# Patient Record
Sex: Male | Born: 1935 | Race: White | Hispanic: No | Marital: Married | Smoking: Former smoker
Health system: Southern US, Community
[De-identification: ages and names within clinical notes are randomized; demographics above are authoritative.]

## PROBLEM LIST (undated history)

## (undated) DIAGNOSIS — C801 Malignant (primary) neoplasm, unspecified: Secondary | ICD-10-CM

## (undated) DIAGNOSIS — E039 Hypothyroidism, unspecified: Secondary | ICD-10-CM

## (undated) DIAGNOSIS — Z9981 Dependence on supplemental oxygen: Secondary | ICD-10-CM

## (undated) DIAGNOSIS — Z720 Tobacco use: Secondary | ICD-10-CM

## (undated) DIAGNOSIS — R911 Solitary pulmonary nodule: Secondary | ICD-10-CM

## (undated) DIAGNOSIS — M1990 Unspecified osteoarthritis, unspecified site: Secondary | ICD-10-CM

## (undated) DIAGNOSIS — Z87442 Personal history of urinary calculi: Secondary | ICD-10-CM

## (undated) DIAGNOSIS — C4490 Unspecified malignant neoplasm of skin, unspecified: Secondary | ICD-10-CM

## (undated) DIAGNOSIS — E785 Hyperlipidemia, unspecified: Secondary | ICD-10-CM

## (undated) DIAGNOSIS — J449 Chronic obstructive pulmonary disease, unspecified: Secondary | ICD-10-CM

## (undated) HISTORY — DX: Solitary pulmonary nodule: R91.1

## (undated) HISTORY — PX: TRACHEOSTOMY: SUR1362

## (undated) HISTORY — PX: CATARACT EXTRACTION, BILATERAL: SHX1313

## (undated) HISTORY — DX: Hyperlipidemia, unspecified: E78.5

## (undated) HISTORY — DX: Chronic obstructive pulmonary disease, unspecified: J44.9

## (undated) HISTORY — DX: Tobacco use: Z72.0

## (undated) HISTORY — DX: Hypothyroidism, unspecified: E03.9

---

## 2000-12-04 ENCOUNTER — Emergency Department (HOSPITAL_COMMUNITY): Admission: EM | Admit: 2000-12-04 | Discharge: 2000-12-04 | Payer: Self-pay | Admitting: Emergency Medicine

## 2006-12-08 ENCOUNTER — Inpatient Hospital Stay (HOSPITAL_COMMUNITY): Admission: EM | Admit: 2006-12-08 | Discharge: 2006-12-12 | Payer: Self-pay | Admitting: Emergency Medicine

## 2006-12-08 ENCOUNTER — Ambulatory Visit: Payer: Self-pay | Admitting: Pulmonary Disease

## 2006-12-12 ENCOUNTER — Encounter (INDEPENDENT_AMBULATORY_CARE_PROVIDER_SITE_OTHER): Payer: Self-pay | Admitting: Internal Medicine

## 2006-12-29 ENCOUNTER — Ambulatory Visit: Payer: Self-pay | Admitting: Pulmonary Disease

## 2007-01-24 ENCOUNTER — Encounter (HOSPITAL_COMMUNITY): Admission: RE | Admit: 2007-01-24 | Discharge: 2007-04-24 | Payer: Self-pay | Admitting: Pulmonary Disease

## 2007-03-27 ENCOUNTER — Encounter: Payer: Self-pay | Admitting: Pulmonary Disease

## 2007-07-03 DIAGNOSIS — R918 Other nonspecific abnormal finding of lung field: Secondary | ICD-10-CM

## 2007-07-03 DIAGNOSIS — J439 Emphysema, unspecified: Secondary | ICD-10-CM | POA: Insufficient documentation

## 2007-07-04 ENCOUNTER — Ambulatory Visit: Payer: Self-pay | Admitting: Pulmonary Disease

## 2007-07-28 ENCOUNTER — Telehealth: Payer: Self-pay | Admitting: Pulmonary Disease

## 2008-03-26 ENCOUNTER — Encounter: Payer: Self-pay | Admitting: Pulmonary Disease

## 2009-04-10 ENCOUNTER — Encounter: Payer: Self-pay | Admitting: Pulmonary Disease

## 2009-11-02 ENCOUNTER — Emergency Department (HOSPITAL_COMMUNITY): Admission: EM | Admit: 2009-11-02 | Discharge: 2009-11-02 | Payer: Self-pay | Admitting: Family Medicine

## 2010-04-16 ENCOUNTER — Encounter: Payer: Self-pay | Admitting: Pulmonary Disease

## 2010-07-13 ENCOUNTER — Encounter: Payer: Self-pay | Admitting: Pulmonary Disease

## 2010-07-16 ENCOUNTER — Encounter: Payer: Self-pay | Admitting: Pulmonary Disease

## 2010-07-17 ENCOUNTER — Telehealth (INDEPENDENT_AMBULATORY_CARE_PROVIDER_SITE_OTHER): Payer: Self-pay | Admitting: *Deleted

## 2010-07-20 ENCOUNTER — Ambulatory Visit
Admission: RE | Admit: 2010-07-20 | Discharge: 2010-07-20 | Payer: Self-pay | Source: Home / Self Care | Attending: Pulmonary Disease | Admitting: Pulmonary Disease

## 2010-07-20 DIAGNOSIS — F17200 Nicotine dependence, unspecified, uncomplicated: Secondary | ICD-10-CM | POA: Insufficient documentation

## 2010-07-22 HISTORY — PX: OTHER SURGICAL HISTORY: SHX169

## 2010-07-23 NOTE — Progress Notes (Signed)
Summary: appt issue - LMTCB x 1  Phone Note Other Incoming Call back at [phone]   Caller: debbie/[facility]  Summary of Call: Pt has a 2/21 appt (consult) with VS Dr. Gwenith Daily wants him seen sooner pls advise. Initial call taken by: Darletta Moll,  July 17, 2010 11:48 AM  Follow-up for Phone Call        Dr. Craige Cotta has an opening for 07/20/10 at 3pm .  Pt will need to arrive 15 minutes early.  Called Debbie back to see if this would be ok but had to [facility]  I scheduled appt in idx for 07/20/10 to hold the spot.  If this appt is ok, the one on 2/21 will need to be canceled.  Gweneth Dimitri RN  July 17, 2010 12:27 PM   Additional Follow-up for Phone Call Additional follow up Details #1::        Debbie called back. I informed her of appt 07/20/10 with dr sood @ 3:00.  She is to make pt aware. I cancelled appt on 2/21.  Lehman Prom  July 17, 2010 3:44 PM

## 2010-07-29 ENCOUNTER — Encounter (INDEPENDENT_AMBULATORY_CARE_PROVIDER_SITE_OTHER): Payer: Medicare Other

## 2010-07-29 ENCOUNTER — Encounter: Payer: Self-pay | Admitting: Pulmonary Disease

## 2010-07-29 DIAGNOSIS — J984 Other disorders of lung: Secondary | ICD-10-CM

## 2010-07-29 DIAGNOSIS — J449 Chronic obstructive pulmonary disease, unspecified: Secondary | ICD-10-CM

## 2010-07-29 NOTE — Assessment & Plan Note (Signed)
Summary: former pt from 2009 / cj   Visit Type:  Initial Consult Copy to:  Marsh Dolly MD Primary Provider/Referring Provider:  Marsh Dolly MD  CC:  Pulmonary Consult. former pt last seen 2009. Marland Kitchen  History of Present Illness: 77 yo male for evaluation of lung nodule.  I last saw [name] in 2009.  He has a history of pulmonary nodules, and these have since been monitored by his primary physician.  His CT imaging had been stable until Apr 16, 2010 when his CT chest showed a new left upper lobe nodule.  As a result he had follow up CT chest on Jan 23,2012 which decrease in size of left upper lobe nodule, but now with new 1.4 x 1.4 cm spiculated nodule.  He then had PET scan  from Jul 16, 2010: 11 mm spiculated RLL nodule with uptake value of 1.4.  As a result pulmonary consultation was requested.  He has a history of COPD.  He continues to smoke 2 to 3 packs in 2 weeks.  He has occasional cough with clear sputum.  He uses spiriva and advair.  He does not use rescue inhaler much.  He does not feel like his breathing limits his activity level.  He denies headache, change in vision, hoarseness, gland swelling, fever, hemoptysis, chest pain, abdominal pain, skin rash, or leg swelling.  He has not had recent PFT.  He denies prior history of pneumonia or TB.  He is retired, and denies occupational exposures.  He has not had any recent travel, or sick exposures.  He denies animal exposures.  He has lost about 5 lbs recently, but feels this is related to increased exercise and diet changes.    Preventive Screening-Counseling & Management  Alcohol-Tobacco     Smoking Status: current  Current Medications (verified): 1)  Zocor 5 Mg  Tabs (Simvastatin) .... Take 1 Tablet By Mouth Once A Day 2)  Advair Diskus 250-50 Mcg/dose  Misc (Fluticasone-Salmeterol) .Marland Kitchen.. 1 Puff  Two Times A Day 3)  Spiriva Handihaler 18 Mcg  Caps (Tiotropium Bromide Monohydrate) .... Take 1 Capsule By Mouth Once A  Day 4)  Ventolin Hfa 108 (90 Base) Mcg/act Aers (Albuterol Sulfate) .... 2 Puffs Every 4 Hrs As Needed 5)  Levothyroxine Sodium 25 Mcg Tabs (Levothyroxine Sodium) .... Once Daily  Allergies: 1)  ! Sulfa  Past History:  Past Medical History: COPD      - PFT 07/14/06 FEV1 1.41(51%), FEV1% 60, TLC 7.3(123%), DLCO 75%, no BD Hyperlipidemia Hypothyroidism  Past Surgical History: Ttracheostomy after aspiration event  Family History: Reviewed history and no changes required. Father - Emphysema, Cancer Mother - Heart disease  Social History: Reviewed history and no changes required. Married.  Retired Audiological scientist.  Started smoking age 56, currently smokes 1 pack in 2 to 3 weeks.  Smoking Status:  current  Review of Systems       The patient complains of shortness of breath with activity and sneezing.  The patient denies shortness of breath at rest, productive cough, non-productive cough, coughing up blood, chest pain, irregular heartbeats, acid heartburn, indigestion, loss of appetite, weight change, abdominal pain, difficulty swallowing, sore throat, tooth/dental problems, headaches, nasal congestion/difficulty breathing through nose, itching, ear ache, anxiety, depression, hand/feet swelling, joint stiffness or pain, rash, change in color of mucus, and fever.    Vital Signs:  Patient profile:   77 year old male Height:      71 inches Weight:  173.25 pounds BMI:     24.25 O2 Sat:      100 % on Room air Temp:     97.6 degrees F oral Pulse rate:   65 / minute BP sitting:   134 / 80  (left arm) Cuff size:   regular  Vitals Entered By: Carver Fila (July 20, 2010 3:00 PM)  O2 Flow:  Room air CC: Pulmonary Consult. former pt last seen 2009.  Comments meds and allergies updated Phone number updated Carver Fila  July 20, 2010 3:01 PM    Physical Exam  General:  normal appearance and healthy appearing.   Eyes:  PERRLA and EOMI.   Nose:  no deformity, discharge,  inflammation, or lesions Mouth:  no deformity or lesions Neck:  no JVD.   Chest Wall:  no deformities noted Lungs:  clear bilaterally to auscultation and percussion Heart:  regular rate and rhythm, S1, S2 without murmurs, rubs, gallops, or clicks Abdomen:  bowel sounds positive; abdomen soft and non-tender without masses, or organomegaly Msk:  no deformity or scoliosis noted with normal posture Pulses:  pulses normal Extremities:  no clubbing, cyanosis, edema, or deformity noted Neurologic:  normal CN II-XII and strength normal.   Cervical Nodes:  no significant adenopathy Axillary Nodes:  no significant adenopathy Psych:  alert and cooperative; normal mood and affect; normal attention span and concentration   Impression & Recommendations:  Problem # 1:  PULMONARY NODULE, RIGHT LOWER LOBE (ICD-518.89) He has new spiculated nodule in right lower lobe with borderline uptake on PET scan.  He has an extensive history of tobacco use.  The main concern is that this represents a primary lung malignancy.  To further assess this three options were discussed: 1) radiographic follow up in 2 to 3 months, 2) electro-navigational bronchoscopy with nodule sampling, 3) thoracic surgery evaluation.  Will proceed with scheduling pulmonary function testing to assess his fitness in the event he needs surgical evaluation.  Will also review his imaging studies at the [facility]) to determine best diagnostic/therapeutic approach.  Problem # 2:  CHRONIC OBSTRUCTIVE PULMONARY DISEASE (ICD-496) He seems well compensated on his current inhaler regimen, and will continue this for now.  Problem # 3:  TOBACCO ABUSE (ICD-305.1) Explained to him the importance of smoking cessation, and offered assistance with this.  Medications Added to Medication List This Visit: 1)  Advair Diskus 250-50 Mcg/dose Misc (Fluticasone-salmeterol) .Marland Kitchen.. 1 puff  two times a day 2)  Ventolin Hfa  108 (90 Base) Mcg/act Aers (Albuterol sulfate) .... 2 puffs every 4 hrs as needed 3)  Levothyroxine Sodium 25 Mcg Tabs (Levothyroxine sodium) .... Once daily  Complete Medication List: 1)  Advair Diskus 250-50 Mcg/dose Misc (Fluticasone-salmeterol) .Marland Kitchen.. 1 puff  two times a day 2)  Spiriva Handihaler 18 Mcg Caps (Tiotropium bromide monohydrate) .... Take 1 capsule by mouth once a day 3)  Ventolin Hfa 108 (90 Base) Mcg/act Aers (Albuterol sulfate) .... 2 puffs every 4 hrs as needed 4)  Zocor 5 Mg Tabs (Simvastatin) .... Take 1 tablet by mouth once a day 5)  Levothyroxine Sodium 25 Mcg Tabs (Levothyroxine sodium) .... Once daily  Other Orders: Consultation Level IV (86578) Full Pulmonary Function Test (PFT)  Patient Instructions: 1)  Will schedule breathing test (PFT) 2)  Follow up in 2 to 3 weeks

## 2010-07-30 ENCOUNTER — Encounter: Payer: Self-pay | Admitting: Pulmonary Disease

## 2010-07-30 ENCOUNTER — Telehealth (INDEPENDENT_AMBULATORY_CARE_PROVIDER_SITE_OTHER): Payer: Self-pay | Admitting: *Deleted

## 2010-08-06 ENCOUNTER — Telehealth: Payer: Self-pay | Admitting: Pulmonary Disease

## 2010-08-06 ENCOUNTER — Other Ambulatory Visit: Payer: Self-pay | Admitting: Pulmonary Disease

## 2010-08-06 DIAGNOSIS — R911 Solitary pulmonary nodule: Secondary | ICD-10-CM

## 2010-08-06 NOTE — Miscellaneous (Signed)
Summary: Pulmonary function test   Pulmonary Function Test Date: 07/29/2010 Height (in.): 70 Gender: Male  Pre-Spirometry FVC    Value: 3.22 L/min   Pred: 4.12 L/min     % Pred: 78 % FEV1    Value: 1.30 L     Pred: 2.62 L     % Pred: 50 % FEV1/FVC  Value: 50 %     Pred: 66 %     % Pred: . % FEF 25-75  Value: 0.34 L/min   Pred: 2.20 L/min     % Pred: 15 %  Post-Spirometry FVC    Value: 3.48 L/min   Pred: 4.12 L/min     % Pred: 85 % FEV1    Value: 1.28 L     Pred: 2.62 L     % Pred: 49 % FEV1/FVC  Value: 37 %     Pred: 66 %     % Pred: . % FEF 25-75  Value: 0.32 L/min   Pred: 2.20 L/min     % Pred: 14 %  Lung Volumes TLC    Value: 6.39 L   % Pred: 100 % RV    Value: 3.17 L   % Pred: 117 % DLCO    Value: 12.3 %   % Pred: 68 % DLCO/VA  Value: 2.71 %   % Pred: 83 %  Comments: Moderate obstruction.  Normal lung volumes.  Mild diffusion defect.  No bronchodilator response. Clinical Lists Changes  Observations: Added new observation of PFT COMMENTS: Moderate obstruction.  Normal lung volumes.  Mild diffusion defect.  No bronchodilator response. (07/29/2010 13:55) Added new observation of DLCO/VA%EXP: 83 % (07/29/2010 13:55) Added new observation of DLCO/VA: 2.71 % (07/29/2010 13:55) Added new observation of DLCO % EXPEC: 68 % (07/29/2010 13:55) Added new observation of DLCO: 12.3 % (07/29/2010 13:55) Added new observation of RV % EXPECT: 117 % (07/29/2010 13:55) Added new observation of RV: 3.17 L (07/29/2010 13:55) Added new observation of TLC % EXPECT: 100 % (07/29/2010 13:55) Added new observation of TLC: 6.39 L (07/29/2010 13:55) Added new observation of FEF2575%EXPS: 14 % (07/29/2010 13:55) Added new observation of PSTFEF25/75P: 2.20  (07/29/2010 13:55) Added new observation of PSTFEF25/75%: 0.32 L/min (07/29/2010 13:55) Added new observation of PSTFEV1/FCV%: . % (07/29/2010 13:55) Added new observation of FEV1FVCPRDPS: 66 % (07/29/2010 13:55) Added new observation of  PSTFEV1/FVC: 37 % (07/29/2010 13:55) Added new observation of POSTFEV1%PRD: 49 % (07/29/2010 13:55) Added new observation of FEV1PRDPST: 2.62 L (07/29/2010 13:55) Added new observation of POST FEV1: 1.28 L/min (07/29/2010 13:55) Added new observation of POST FVC%EXP: 85 % (07/29/2010 13:55) Added new observation of FVCPRDPST: 4.12 L/min (07/29/2010 13:55) Added new observation of POST FVC: 3.48 L (07/29/2010 13:55) Added new observation of FEF % EXPEC: 15 % (07/29/2010 13:55) Added new observation of FEF25-75%PRE: 2.20 L/min (07/29/2010 13:55) Added new observation of FEF 25-75%: 0.34 L/min (07/29/2010 13:55) Added new observation of FEV1/FVC%EXP: . % (07/29/2010 13:55) Added new observation of FEV1/FVC PRE: 66 % (07/29/2010 13:55) Added new observation of FEV1/FVC: 50 % (07/29/2010 13:55) Added new observation of FEV1 % EXP: 50 % (07/29/2010 13:55) Added new observation of FEV1 PREDICT: 2.62 L (07/29/2010 13:55) Added new observation of FEV1: 1.30 L (07/29/2010 13:55) Added new observation of FVC % EXPECT: 78 % (07/29/2010 13:55) Added new observation of FVC PREDICT: 4.12 L (07/29/2010 13:55) Added new observation of FVC: 3.22 L (07/29/2010 13:55) Added new observation of PFT HEIGHT: 70  (07/29/2010 13:55) Added new observation of  PFT DATE: 07/29/2010  (07/29/2010 13:55)

## 2010-08-06 NOTE — Progress Notes (Signed)
Summary: Test results<<<pt aware  Phone Note Call from Patient Call back at Home Phone [phone]   Caller: Patient Summary of Call: Returned Dr. Evlyn Courier call regarding his breathing test yesterday. Initial call taken by: Leonette Monarch,  July 30, 2010 3:39 PM  Follow-up for Phone Call        Spoke with pt and he states Dr. Craige Cotta called him about breathing test.  Dr. Craige Cotta did you call the pt? Carron Curie CMA  July 30, 2010 4:23 PM   Additional Follow-up for Phone Call Additional follow up Details #1::        Spoke with pt about PFT results.  Will review CT chest with Dr. Delton Coombes to determine if he is a candidate for ENB at this time, or if he should have radiographic follow up. Additional Follow-up by: Coralyn Helling MD,  July 30, 2010 4:48 PM

## 2010-08-06 NOTE — Assessment & Plan Note (Signed)
Summary: pft ///kp   Allergies: 1)  ! Sulfa   Other Orders: Carbon Monoxide diffusing w/capacity (16109) Lung Volumes/Gas dilution or washout (60454) Spirometry (Pre & Post) [phone])

## 2010-08-07 ENCOUNTER — Ambulatory Visit (INDEPENDENT_AMBULATORY_CARE_PROVIDER_SITE_OTHER)
Admission: RE | Admit: 2010-08-07 | Discharge: 2010-08-07 | Disposition: A | Discharge status: Home / Self Care | Payer: Medicare Other | Source: Ambulatory Visit | Attending: Pulmonary Disease | Admitting: Pulmonary Disease

## 2010-08-07 DIAGNOSIS — R911 Solitary pulmonary nodule: Secondary | ICD-10-CM

## 2010-08-07 DIAGNOSIS — J984 Other disorders of lung: Secondary | ICD-10-CM

## 2010-08-10 ENCOUNTER — Ambulatory Visit (INDEPENDENT_AMBULATORY_CARE_PROVIDER_SITE_OTHER): Payer: Medicare Other | Admitting: Pulmonary Disease

## 2010-08-10 ENCOUNTER — Encounter: Payer: Self-pay | Admitting: Pulmonary Disease

## 2010-08-10 DIAGNOSIS — J984 Other disorders of lung: Secondary | ICD-10-CM

## 2010-08-10 DIAGNOSIS — F17200 Nicotine dependence, unspecified, uncomplicated: Secondary | ICD-10-CM

## 2010-08-10 DIAGNOSIS — J449 Chronic obstructive pulmonary disease, unspecified: Secondary | ICD-10-CM

## 2010-08-11 ENCOUNTER — Institutional Professional Consult (permissible substitution): Payer: Self-pay | Admitting: Pulmonary Disease

## 2010-08-12 ENCOUNTER — Telehealth: Payer: Self-pay | Admitting: Emergency Medicine

## 2010-08-12 NOTE — Progress Notes (Addendum)
Summary: Schedule ENB  Phone Note Outgoing Call   Call placed by: Coralyn Helling MD,  August 06, 2010 10:38 AM Call placed to: Patient Summary of Call: Spoke with pt's spouse.  Discussed plan to arrange for ENB to further assess RLL nodule.  Explained the he will need to have repeat CT chest with Super D cuts in prep. for procedure.  Explained that he will need to have intubation and general anesthesia for procedure.  Risks were detailed as bleeding, infection, pneumothorax, and non-diagnosis.  Will place order for CT chest and schedule for ENB through [facility].  Will forward message to Dr. Delton Coombes for his review. Initial call taken by: Coralyn Helling MD,  August 06, 2010 10:38 AM

## 2010-08-14 ENCOUNTER — Telehealth (INDEPENDENT_AMBULATORY_CARE_PROVIDER_SITE_OTHER): Payer: Self-pay | Admitting: *Deleted

## 2010-08-17 ENCOUNTER — Ambulatory Visit (HOSPITAL_COMMUNITY)
Admission: RE | Admit: 2010-08-17 | Discharge: 2010-08-17 | Disposition: A | Discharge status: Home / Self Care | Payer: Medicare Other | Source: Ambulatory Visit | Attending: Emergency Medicine | Admitting: Emergency Medicine

## 2010-08-17 ENCOUNTER — Encounter (HOSPITAL_COMMUNITY)
Admission: RE | Admit: 2010-08-17 | Discharge: 2010-08-17 | Disposition: A | Discharge status: Home / Self Care | Payer: Medicare Other | Source: Ambulatory Visit | Attending: Emergency Medicine | Admitting: Emergency Medicine

## 2010-08-17 ENCOUNTER — Other Ambulatory Visit: Payer: Self-pay | Admitting: Emergency Medicine

## 2010-08-17 DIAGNOSIS — R918 Other nonspecific abnormal finding of lung field: Secondary | ICD-10-CM

## 2010-08-17 DIAGNOSIS — R0602 Shortness of breath: Secondary | ICD-10-CM | POA: Insufficient documentation

## 2010-08-17 DIAGNOSIS — J449 Chronic obstructive pulmonary disease, unspecified: Secondary | ICD-10-CM | POA: Insufficient documentation

## 2010-08-17 DIAGNOSIS — Z01810 Encounter for preprocedural cardiovascular examination: Secondary | ICD-10-CM | POA: Insufficient documentation

## 2010-08-17 DIAGNOSIS — J4489 Other specified chronic obstructive pulmonary disease: Secondary | ICD-10-CM | POA: Insufficient documentation

## 2010-08-17 DIAGNOSIS — R222 Localized swelling, mass and lump, trunk: Secondary | ICD-10-CM | POA: Insufficient documentation

## 2010-08-17 DIAGNOSIS — Z01812 Encounter for preprocedural laboratory examination: Secondary | ICD-10-CM | POA: Insufficient documentation

## 2010-08-17 LAB — BASIC METABOLIC PANEL
Chloride: 107 mEq/L (ref 96–112)
GFR calc Af Amer: >60 mL/min (ref >60)
GFR calc non Af Amer: 56 mL/min — ABNORMAL LOW (ref >60)
Potassium: 4.9 mEq/L (ref 3.5–5.1)
Sodium: 140 mEq/L (ref 135–145)

## 2010-08-17 LAB — SURGICAL PCR SCREEN
MRSA, PCR: NEGATIVE
Staphylococcus aureus: NEGATIVE

## 2010-08-17 LAB — CBC
HCT: 42.7 % (ref 39.0–52.0)
Hemoglobin: 14.6 g/dL (ref 13.0–17.0)
MCV: 91.8 fL (ref 78.0–100.0)
RBC: 4.65 MIL/uL (ref 4.22–5.81)
WBC: 7 10*3/uL (ref 4.0–10.5)

## 2010-08-17 LAB — APTT: aPTT: 28 seconds (ref 24–37)

## 2010-08-18 NOTE — Assessment & Plan Note (Signed)
Summary: rov 3 wks ///kp   Visit Type:  Follow-up Copy to:  Marsh Dolly MD Primary Provider/Referring Provider:  Marsh Dolly MD  CC:  3 week follow up. Pt states his breathing has unchanged. Pt states he has no new complaints today.  History of Present Illness: 77 yo male with Pulmonary nodule, COPD.  He is being scheduled for bronchoscopy with ENB.  This has been scheduled for 08/19/10.  CT of Chest  Procedure date:  08/07/2010  Findings:      Technique:  Multidetector CT imaging of the chest was performed using thin slice collimation for electromagnetic bronchoscopy planning purposes, without  intravenous contrast.   Comparison:  None   Findings:  The chest wall is unremarkable.  No supraclavicular or axillary adenopathy.  A small left thyroid nodule is noted.  The bony thorax is intact.   The heart is normal in size.  No pericardial effusion.  No mediastinal or hilar adenopathy.  Small scattered lymph nodes are noted.  The aorta is normal in caliber.  Mild atherosclerotic calcifications.  Coronary artery calcifications are also noted. The esophagus is grossly normal.   Examination of the lung parenchyma demonstrates mild emphysematous changes.  There is a 7 mm nodule in the superior segment of the right lower lobe on image number 32.  There is an ill-defined nodular opacity in the right lower lobe on image number 44 which measures 11 mm.  On image number 35 there is a calcified nodule near the major fissure which is likely a granuloma.  A 3 mm nodule is noted in the left upper lobe on image number 10.  No pleural effusions or pulmonary edema.   There are areas of peribronchial thickening typically in the lower lung zones.  No infiltrates, edema or effusions.   The upper abdomen demonstrates no significant abnormalities.   IMPRESSION:   1.  Four pulmonary nodules as specifically described above.  The largest lesion has irregular borders and is in the  right lower lobe. 2.  No mediastinal or hilar adenopathy.   Current Medications (verified): 1)  Advair Diskus 250-50 Mcg/dose  Misc (Fluticasone-Salmeterol) .Marland Kitchen.. 1 Puff  Two Times A Day 2)  Spiriva Handihaler 18 Mcg  Caps (Tiotropium Bromide Monohydrate) .... Take 1 Capsule By Mouth Once A Day 3)  Ventolin Hfa 108 (90 Base) Mcg/act Aers (Albuterol Sulfate) .... 2 Puffs Every 4 Hrs As Needed 4)  Zocor 5 Mg  Tabs (Simvastatin) .... Take 1 Tablet By Mouth Once A Day 5)  Levothyroxine Sodium 25 Mcg Tabs (Levothyroxine Sodium) .... Once Daily  Allergies (verified): 1)  ! Sulfa  Social History: Married.  Retired Audiological scientist.  Started smoking age 32, smoked 1 pack in 2 to 3 weeks. Pt states he quit smoking 08/03/10  Vital Signs:  Patient profile:   77 year old male Height:      71 inches Weight:      176 pounds BMI:     24.64 O2 Sat:      98 % on Room air Temp:     97.6 degrees F oral Pulse rate:   88 / minute BP sitting:   142 / 76  (left arm) Cuff size:   regular  Vitals Entered By: Carver Fila (August 10, 2010 3:47 PM)  O2 Flow:  Room air CC: 3 week follow up. Pt states his breathing has unchanged. Pt states he has no new complaints today Comments meds and allergies updated Phone number updated [facility]  August 10, 2010 3:47 PM     Impression & Recommendations:  Problem # 1:  PULMONARY NODULE, RIGHT LOWER LOBE (ICD-518.89) Will cancel this appointment, and reschedule after ENB results reviewed.  Problem # 2:  CHRONIC OBSTRUCTIVE PULMONARY DISEASE (ICD-496) Assessment: Unchanged  Problem # 3:  TOBACCO ABUSE (ICD-305.1) Assessment: Unchanged  Complete Medication List: 1)  Advair Diskus 250-50 Mcg/dose Misc (Fluticasone-salmeterol) .Marland Kitchen.. 1 puff  two times a day 2)  Spiriva Handihaler 18 Mcg Caps (Tiotropium bromide monohydrate) .... Take 1 capsule by mouth once a day 3)  Ventolin Hfa 108 (90 Base) Mcg/act Aers (Albuterol sulfate) .... 2 puffs every 4 hrs as  needed 4)  Zocor 5 Mg Tabs (Simvastatin) .... Take 1 tablet by mouth once a day 5)  Levothyroxine Sodium 25 Mcg Tabs (Levothyroxine sodium) .... Once daily  Other Orders: No Charge Patient Arrived (NCPA0) (NCPA0)  Patient Instructions: 1)  Bronchoscopy is scheduled for February 29 at [facility] 2)  Follow up 2 to 3 days after Bronchoscopy   Immunization History:  Influenza Immunization History:    Influenza:  historical (03/21/2010)  Pneumovax Immunization History:    Pneumovax:  historical (03/21/2008)

## 2010-08-18 NOTE — Progress Notes (Signed)
Summary: wants our office to call [employer] for prior auth for 2/29  procedure  Phone Note Call from Patient   Caller: SPOUSE/[name] Call For: SOOD Summary of Call: Patients spouse [name] phoned and stated that [name] is scheduled for a procedure next Wed 2/29 and she thinks that our office should call [employer] for prior authorization. She can be reached  at [phone] Initial call taken by: Vedia Coffer,  August 14, 2010 3:31 PM  Follow-up for Phone Call        Almyra Free, I spoke with [name] and he says that as a courtesy to the patient we need to do pre-cert for ENB. Pt has [employer] and they will more than likely deny coverage but [name] says that this will help the patient with the appeal process. The patient is aware we are working on this.  Forbes Cellar CMA  August 14, 2010 3:52 PM  Additional Follow-up for Phone Call Additional follow up Details #1::        spoke to [facility] and ENB does not need to be precerted pt is aware of this  Additional Follow-up by: Oneita Jolly,  August 14, 2010 4:22 PM

## 2010-08-18 NOTE — Progress Notes (Signed)
Summary: wants to verify which doc will be performing the bronch  Phone Note Call from Patient   Caller: spouse/[place] Call For: sood Summary of Call: patients wife phoned stated that Dr Craige Cotta told them that Dr Delton Coombes would be doing the patients Bronch on 2/29 but this morning they received a call stating that Dr Arva Chafe would be doing the procedure and she just wants to verify who will be performing the procdure. She can be reached at [phone] Initial call taken by: Vedia Coffer,  August 12, 2010 11:58 AM  Follow-up for Phone Call        Spoke with Almyra Free and she states Dr. Delton Coombes will do the procedure but Dr. Edwyna Shell will be present. Pt wife advised.Carron Curie CMA  August 12, 2010 12:09 PM

## 2010-08-19 ENCOUNTER — Other Ambulatory Visit: Payer: Self-pay | Admitting: Emergency Medicine

## 2010-08-19 ENCOUNTER — Ambulatory Visit (HOSPITAL_COMMUNITY)
Admission: RE | Admit: 2010-08-19 | Discharge: 2010-08-19 | Disposition: A | Discharge status: Home / Self Care | Payer: Medicare Other | Source: Ambulatory Visit | Attending: Emergency Medicine | Admitting: Emergency Medicine

## 2010-08-19 ENCOUNTER — Ambulatory Visit (HOSPITAL_COMMUNITY): Payer: Medicare Other

## 2010-08-19 DIAGNOSIS — J4489 Other specified chronic obstructive pulmonary disease: Secondary | ICD-10-CM | POA: Insufficient documentation

## 2010-08-19 DIAGNOSIS — R222 Localized swelling, mass and lump, trunk: Secondary | ICD-10-CM | POA: Insufficient documentation

## 2010-08-19 DIAGNOSIS — J449 Chronic obstructive pulmonary disease, unspecified: Secondary | ICD-10-CM | POA: Insufficient documentation

## 2010-08-20 ENCOUNTER — Emergency Department (HOSPITAL_COMMUNITY)
Admission: EM | Admit: 2010-08-20 | Discharge: 2010-08-20 | Disposition: A | Discharge status: Home / Self Care | Payer: Medicare Other | Attending: Emergency Medicine | Admitting: Emergency Medicine

## 2010-08-20 ENCOUNTER — Telehealth (INDEPENDENT_AMBULATORY_CARE_PROVIDER_SITE_OTHER): Payer: Self-pay | Admitting: *Deleted

## 2010-08-20 DIAGNOSIS — J4489 Other specified chronic obstructive pulmonary disease: Secondary | ICD-10-CM | POA: Insufficient documentation

## 2010-08-20 DIAGNOSIS — J449 Chronic obstructive pulmonary disease, unspecified: Secondary | ICD-10-CM | POA: Insufficient documentation

## 2010-08-20 DIAGNOSIS — R351 Nocturia: Secondary | ICD-10-CM | POA: Insufficient documentation

## 2010-08-20 DIAGNOSIS — Z79899 Other long term (current) drug therapy: Secondary | ICD-10-CM | POA: Insufficient documentation

## 2010-08-20 DIAGNOSIS — N390 Urinary tract infection, site not specified: Secondary | ICD-10-CM | POA: Insufficient documentation

## 2010-08-20 DIAGNOSIS — N3943 Post-void dribbling: Secondary | ICD-10-CM | POA: Insufficient documentation

## 2010-08-20 DIAGNOSIS — R339 Retention of urine, unspecified: Secondary | ICD-10-CM | POA: Insufficient documentation

## 2010-08-20 DIAGNOSIS — N400 Enlarged prostate without lower urinary tract symptoms: Secondary | ICD-10-CM | POA: Insufficient documentation

## 2010-08-20 LAB — BASIC METABOLIC PANEL
CO2: 28 mEq/L (ref 19–32)
Calcium: 9.1 mg/dL (ref 8.4–10.5)
Creatinine, Ser: 1.3 mg/dL (ref 0.4–1.5)
GFR calc Af Amer: >60 mL/min (ref >60)
GFR calc non Af Amer: 53 mL/min — ABNORMAL LOW (ref >60)

## 2010-08-20 LAB — URINALYSIS, ROUTINE W REFLEX MICROSCOPIC
Nitrite: NEGATIVE
Protein, ur: NEGATIVE mg/dL
Urobilinogen, UA: 0.2 mg/dL (ref 0.0–1.0)

## 2010-08-20 LAB — CBC
HCT: 38.5 % — ABNORMAL LOW (ref 39.0–52.0)
Hemoglobin: 13.3 g/dL (ref 13.0–17.0)
MCHC: 34.5 g/dL (ref 30.0–36.0)
RBC: 4.25 MIL/uL (ref 4.22–5.81)
WBC: 8.8 10*3/uL (ref 4.0–10.5)

## 2010-08-20 LAB — DIFFERENTIAL
Basophils Absolute: 0 10*3/uL (ref 0.0–0.1)
Basophils Relative: 0 % (ref 0–1)
Lymphocytes Relative: 15 % (ref 12–46)
Monocytes Absolute: 0.6 10*3/uL (ref 0.1–1.0)
Neutro Abs: 6.8 10*3/uL (ref 1.7–7.7)
Neutrophils Relative %: 77 % (ref 43–77)

## 2010-08-20 LAB — URINE MICROSCOPIC-ADD ON

## 2010-08-21 ENCOUNTER — Encounter: Payer: Self-pay | Admitting: Pulmonary Disease

## 2010-08-21 ENCOUNTER — Other Ambulatory Visit: Payer: Self-pay | Admitting: Pulmonary Disease

## 2010-08-21 ENCOUNTER — Ambulatory Visit (INDEPENDENT_AMBULATORY_CARE_PROVIDER_SITE_OTHER): Payer: Medicare Other | Admitting: Pulmonary Disease

## 2010-08-21 DIAGNOSIS — R911 Solitary pulmonary nodule: Secondary | ICD-10-CM

## 2010-08-21 DIAGNOSIS — R339 Retention of urine, unspecified: Secondary | ICD-10-CM | POA: Insufficient documentation

## 2010-08-21 DIAGNOSIS — J449 Chronic obstructive pulmonary disease, unspecified: Secondary | ICD-10-CM

## 2010-08-21 DIAGNOSIS — J984 Other disorders of lung: Secondary | ICD-10-CM

## 2010-08-21 LAB — CULTURE, RESPIRATORY W GRAM STAIN

## 2010-08-22 LAB — CULTURE, RESPIRATORY W GRAM STAIN: Culture: NO GROWTH

## 2010-08-27 NOTE — Progress Notes (Signed)
Summary: urinary urgency- scant output. Pt takes spiriva.  Phone Note Call from Patient   Caller: Patient Call For: BYRUM Summary of Call: Patient phoned stated that he has surger with Dr Delton Coombes yesterday and he is having a problem urinating today he has to go about every five minutes and when he goes he cant do anything it just dribbles. He uses [facility]. He can be reached at [phone] or cell [phone] Initial call taken by: Vedia Coffer,  August 20, 2010 9:40 AM  Follow-up for Phone Call        Spoke with pt.  He had FOB 08/19/10 per RB.  Since last night has had urinary urgeny/frequeny but with scant urine output.  He states has never had this issue before and denied any other c/o's. I advised that I am unsure if this is coming from having this procedure.. Note that pt is taking spiriva though.  Will forward to RB.  Pls advise thanks Follow-up by: Vernie Murders,  August 20, 2010 10:29 AM  Additional Follow-up for Phone Call Additional follow up Details #1::        I don't have any reason to believe it has to do with the bronchoscopy - I don't think he used a foley for the case. He should hold the Spiriva temporarily, has plans already to see Dr Craige Cotta in the am, should have a UA at that time. If he feels the need to void but cannot go, we may need to send him to ED. Leslye Peer MD  August 20, 2010 2:05 PM  Additional Follow-up by: Leslye Peer MD,  August 20, 2010 2:06 PM    Additional Follow-up for Phone Call Additional follow up Details #2::    Spoke with pt and notified of the above recs per RB.  Pt verbalized understanding.  States that he is feeling uncomfortable and still unable to urinate.  Advised that h should go to ED asap and he verbalized understanding.  Follow-up by: Vernie Murders,  August 20, 2010 2:22 PM

## 2010-08-29 ENCOUNTER — Emergency Department (HOSPITAL_COMMUNITY)
Admission: EM | Admit: 2010-08-29 | Discharge: 2010-08-29 | Disposition: A | Discharge status: Home / Self Care | Payer: Medicare Other | Attending: Emergency Medicine | Admitting: Emergency Medicine

## 2010-08-29 DIAGNOSIS — R339 Retention of urine, unspecified: Secondary | ICD-10-CM | POA: Insufficient documentation

## 2010-08-29 DIAGNOSIS — J4489 Other specified chronic obstructive pulmonary disease: Secondary | ICD-10-CM | POA: Insufficient documentation

## 2010-08-29 DIAGNOSIS — J449 Chronic obstructive pulmonary disease, unspecified: Secondary | ICD-10-CM | POA: Insufficient documentation

## 2010-09-01 NOTE — Op Note (Signed)
NAME:  [name], [name] NO.:  [id number]  MEDICAL RECORD NO.:  [id number]           PATIENT TYPE:  O  LOCATION:  SDSC                         FACILITY:  [facility]  PHYSICIAN:  Leslye Peer, MD    DATE OF BIRTH:  February 15, 1928  DATE OF PROCEDURE:  08/19/2010 DATE OF DISCHARGE:  08/19/2010                              OPERATIVE REPORT   PROCEDURE:  Video fiberoptic bronchoscopy with electromagnetic navigation and biopsies.  OPERATOR:  Leslye Peer, MD  INDICATION:  Right lower lobe mass.  MEDICATIONS GIVEN:  The procedure was done under general anesthesia with endotracheal intubation.  CONSENT:  Informed consent was obtained from the patient and a signed copy was on his hospital chart.  PROCEDURE IN DETAILS:  Prior to the date of Mr. Poehler procedure, high-resolution CT scanning was performed to allow the use of super dimension software to create a virtual tracheobronchial tree.  Using this information, a discrete navigation pathway was created leading to the patient's right lower lobe nodule.  On the date of the procedure, the patient's consent was obtained and he was taken to the operating room for general anesthesia and endotracheal intubation.  A formal time- out was performed and then a screening bronchoscopy was performed via the patient's endotracheal tube.  All airways were normal in appearance. There were no endobronchial lesions or abnormal secretions seen throughout the entire tracheobronchial tree.  The extendable working channel and locator guide were then introduced into the bronchoscope and using the preplanned navigation pathway the locator guide was navigated into the region of the patient's right lower lobe nodule.  We were able to initially navigate within approximately 1.7-2.0 cm away from the lesion.  At this location, needle brushings and needle biopsies were performed into the nodule.  The locator guide was then reintroduced  into the extendable working channel and we were able to navigate to a closure position to within approximately 0.3 cm of the center of the patient's nodule.  At this location, we repeated needle brushings and needle biopsies.  We also performed transbronchial forceps biopsies and a bronchoalveolar lavage to be sent for cytology, pathology, and microbiology.  All of the biopsies were performed under fluoroscopic guidance in real time.  The patient tolerated the procedure well.  There was no significant bleeding noted, at the end of the case there was good hemostasis.  The bronchoscope was withdrawn and the patient was waked from general anesthesia.  A postprocedural chest x-ray showed some slight hemorrhage around patient's known right lower lobe nodule but no evidence of pneumothorax.  SAMPLES: 1. Needle brushings from the right lower lobe. 2. Needle biopsies from the right lower lobe. 3. Forceps biopsies from the right lower lobe. 4. Bronchoalveolar lavage from the right lower lobe. 5. Pooled bronchial washings.  PLAN:  The patient's samples will be sent for pathology, cytology, and microbiology.  He will be discharged to home as his chest x-ray did not show any evidence of pneumothorax.  He already has plan to follow up with Dr. Craige Cotta on Friday, March 2.  Hopefully the patient's final pathology results will be available, to  be reviewed at that time.     Leslye Peer, MD     RSB/MEDQ  D:  08/19/2010  T:  08/20/2010  Job:  [id number]  cc:   Coralyn Helling, MD  Electronically Signed by Levy Pupa MD on 09/01/2010 04:11:11 PM

## 2010-09-01 NOTE — Assessment & Plan Note (Signed)
Summary: rov after bronch per mindy ///kp   Copy to:  Semret Mebrahtu, Larey Dresser Primary Provider/Referring Provider:  Marsh Dolly MD  CC:  follow up. Pt here for bronch results..  History of Present Illness: 77 yo male with Pulmonary nodule, COPD.  He had navigational bronchoscopy on 02/29.  Biopsy results showed atypical cells from Rt lower lobe nodule, but were non-diagnositic for malignancy.  He developed urine retention one day after the procedure, and had to go to the ER.  He was told that he had a UTI, started on keflex, and had a foley catheter placed.  This was left in, and he is to follow up with Dr. Vonita Moss with urology later today.  He has not smoked since Feb. 13.  Current Medications (verified): 1)  Advair Diskus 250-50 Mcg/dose  Misc (Fluticasone-Salmeterol) .Marland Kitchen.. 1 Puff  Two Times A Day 2)  Spiriva Handihaler 18 Mcg  Caps (Tiotropium Bromide Monohydrate) .... Take 1 Capsule By Mouth Once A Day 3)  Ventolin Hfa 108 (90 Base) Mcg/act Aers (Albuterol Sulfate) .... 2 Puffs Every 4 Hrs As Needed 4)  Zocor 5 Mg  Tabs (Simvastatin) .... Take 1 Tablet By Mouth Once A Day 5)  Levothyroxine Sodium 25 Mcg Tabs (Levothyroxine Sodium) .... Once Daily 6)  Keflex 500 Mg Caps (Cephalexin) .Marland Kitchen.. 1 Two Times A Day  Allergies (verified): 1)  ! Sulfa  Past History:  Past Medical History: COPD      - PFT 07/14/06 FEV1 1.41(51%), FEV1% 60, TLC 7.3(123%), DLCO 75%, no BD      - PFT 07/29/10>>FEV1 1.30(50%), FEV1% 50, TLC 6.39(100%), DLCO 68%, no BD Hyperlipidemia Hypothyroidism Pulmonary nodule Rt lower lobe  Past Surgical History: Ttracheostomy after aspiration event Electronavigational bronchoscopy Feb 2012 Dr. Delton Coombes  Vital Signs:  Patient profile:   77 year old male Height:      71 inches Weight:      176 pounds BMI:     24.64 O2 Sat:      100 % on Room air Temp:     97.4 degrees F oral Pulse rate:   72 / minute BP sitting:   142 / 76  (left arm) Cuff size:    regular  Vitals Entered By: Carver Fila (August 21, 2010 11:35 AM)  O2 Flow:  Room air CC: follow up. Pt here for bronch results. Comments meds and allergies updated Phone number updated Carver Fila  August 21, 2010 11:38 AM    Physical Exam  General:  normal appearance and healthy appearing.   Nose:  no deformity, discharge, inflammation, or lesions Mouth:  no deformity or lesions Neck:  no JVD.   Lungs:  clear bilaterally to auscultation and percussion Heart:  regular rate and rhythm, S1, S2 without murmurs, rubs, gallops, or clicks Extremities:  no clubbing, cyanosis, edema, or deformity noted Neurologic:  normal CN II-XII.   Cervical Nodes:  no significant adenopathy   Impression & Recommendations:  Problem # 1:  PULMONARY NODULE, RIGHT LOWER LOBE (ICD-518.89) His bronchoscopy results showed atypical cells, but were non-diagnostic for malignancy.    I reviewed three option with Mr. Corsino and his wife: 1) Proceed with surgical evaluation for possible VATS with wedge resection, 2) follow up CT chest in 3 months, 3) clinical observation.  He is quite reluctant to have surgery, and I am not sure he is the best candidate for surgery given his lung function and age.  He has opted for radiographic follow up in 3  months.  Depending on results will determine if he needs to have additional biopsy attempt or surgical evaluation.  Problem # 2:  CHRONIC OBSTRUCTIVE PULMONARY DISEASE (ICD-496) Stable on current regimen.  Will await further eval. by urology to determine if he needs to change from spiriva due to recent episode of urine retention.  Problem # 3:  TOBACCO ABUSE (ICD-305.1) Encouraged him to continue with smoking cessation.  Problem # 4:  URINARY RETENTION (ICD-788.20) He is to follow up with urology.  I am not sure if this is related to a post-procedure complication after ENB.  Also he has been using spiriva for several years with symptomatic benefit.  I am not sure spiriva  is causing this either.  Will await further input from urology.  Medications Added to Medication List This Visit: 1)  Keflex 500 Mg Caps (Cephalexin) .Marland Kitchen.. 1 two times a day  Complete Medication List: 1)  Advair Diskus 250-50 Mcg/dose Misc (Fluticasone-salmeterol) .Marland Kitchen.. 1 puff  two times a day 2)  Spiriva Handihaler 18 Mcg Caps (Tiotropium bromide monohydrate) .... Take 1 capsule by mouth once a day 3)  Ventolin Hfa 108 (90 Base) Mcg/act Aers (Albuterol sulfate) .... 2 puffs every 4 hrs as needed 4)  Zocor 5 Mg Tabs (Simvastatin) .... Take 1 tablet by mouth once a day 5)  Levothyroxine Sodium 25 Mcg Tabs (Levothyroxine sodium) .... Once daily 6)  Keflex 500 Mg Caps (Cephalexin) .Marland Kitchen.. 1 two times a day  Other Orders: Est. Patient Level III (16109) Radiology Referral (Radiology)  Patient Instructions: 1)  Will schedule CT chest for 3 months and follow up after

## 2010-09-16 LAB — FUNGUS CULTURE W SMEAR
Fungal Smear: NONE SEEN
Fungal Smear: NONE SEEN

## 2010-09-21 ENCOUNTER — Other Ambulatory Visit: Payer: Self-pay | Admitting: Urology

## 2010-09-21 ENCOUNTER — Ambulatory Visit
Admission: RE | Admit: 2010-09-21 | Discharge: 2010-09-21 | Disposition: A | Discharge status: Home / Self Care | Payer: Medicare Other | Source: Ambulatory Visit | Attending: Urology | Admitting: Urology

## 2010-09-21 DIAGNOSIS — Z01811 Encounter for preprocedural respiratory examination: Secondary | ICD-10-CM

## 2010-09-22 ENCOUNTER — Ambulatory Visit (HOSPITAL_BASED_OUTPATIENT_CLINIC_OR_DEPARTMENT_OTHER)
Admission: RE | Admit: 2010-09-22 | Discharge: 2010-09-23 | Disposition: A | Discharge status: Home / Self Care | Payer: Medicare Other | Source: Ambulatory Visit | Attending: Urology | Admitting: Urology

## 2010-09-22 ENCOUNTER — Other Ambulatory Visit: Payer: Self-pay | Admitting: Urology

## 2010-09-22 DIAGNOSIS — F17200 Nicotine dependence, unspecified, uncomplicated: Secondary | ICD-10-CM | POA: Insufficient documentation

## 2010-09-22 DIAGNOSIS — R339 Retention of urine, unspecified: Secondary | ICD-10-CM | POA: Insufficient documentation

## 2010-09-22 DIAGNOSIS — Z01812 Encounter for preprocedural laboratory examination: Secondary | ICD-10-CM | POA: Insufficient documentation

## 2010-09-22 DIAGNOSIS — J4489 Other specified chronic obstructive pulmonary disease: Secondary | ICD-10-CM | POA: Insufficient documentation

## 2010-09-22 DIAGNOSIS — R0609 Other forms of dyspnea: Secondary | ICD-10-CM | POA: Insufficient documentation

## 2010-09-22 DIAGNOSIS — N400 Enlarged prostate without lower urinary tract symptoms: Secondary | ICD-10-CM | POA: Insufficient documentation

## 2010-09-22 DIAGNOSIS — J449 Chronic obstructive pulmonary disease, unspecified: Secondary | ICD-10-CM | POA: Insufficient documentation

## 2010-09-22 DIAGNOSIS — Z79899 Other long term (current) drug therapy: Secondary | ICD-10-CM | POA: Insufficient documentation

## 2010-09-22 DIAGNOSIS — R0989 Other specified symptoms and signs involving the circulatory and respiratory systems: Secondary | ICD-10-CM | POA: Insufficient documentation

## 2010-09-23 LAB — BASIC METABOLIC PANEL
BUN: 9 mg/dL (ref 6–23)
CO2: 31 mEq/L (ref 19–32)
Chloride: 102 mEq/L (ref 96–112)
Creatinine, Ser: 1.22 mg/dL (ref 0.4–1.5)

## 2010-09-23 LAB — CBC
Hemoglobin: 12.5 g/dL — ABNORMAL LOW (ref 13.0–17.0)
MCH: 30.6 pg (ref 26.0–34.0)
MCV: 94.4 fL (ref 78.0–100.0)
RBC: 4.08 MIL/uL — ABNORMAL LOW (ref 4.22–5.81)

## 2010-09-23 LAB — DIFFERENTIAL
Eosinophils Absolute: 0.2 10*3/uL (ref 0.0–0.7)
Lymphs Abs: 1.8 10*3/uL (ref 0.7–4.0)
Monocytes Relative: 9 % (ref 3–12)
Neutro Abs: 6.5 10*3/uL (ref 1.7–7.7)
Neutrophils Relative %: 70 % (ref 43–77)

## 2010-09-25 LAB — URINE CULTURE: Colony Count: >=100000

## 2010-09-30 NOTE — Op Note (Signed)
  NAME:  BURAK, ZERBE NO.:  [id number]  MEDICAL RECORD NO.:  [id number]          PATIENT TYPE:  LOCATION:                                 FACILITY:  PHYSICIAN:  Maretta Bees. Vonita Moss, M.D.DATE OF BIRTH:  May 22, 1928  DATE OF PROCEDURE: DATE OF DISCHARGE:                              OPERATIVE REPORT   PREOPERATIVE DIAGNOSES:  Benign prostatic hypertrophy and urinary retention.  POSTOPERATIVE DIAGNOSES:  Benign prostatic hypertrophy and urinary retention.  PROCEDURE:  Transurethral resection of prostate ([place]).  SURGEON:  Maretta Bees. Vonita Moss, M.D.  ANESTHESIA:  General.  INDICATION:  This gentleman has had a past history of bladder outlet obstructive symptoms and underwent pulmonary procedure.  Since then, his voiding has been very poor with recurrent urinary retention despite Rapaflo and voiding trials.  He was counseled about surgical intervention after an office cystoscopy revealed bilobar hypertrophy of the prostate and a nodular obstructing tissue at the bladder neck.  He was counseled about the risk of infection, hemorrhage, and sexual dysfunction.  He was told about the possibility of repeated retention.  PROCEDURE:  The patient was brought to the operating room and placed in lithotomy position.  The external genitalia were prepped and draped in usual fashion.  The resectoscope sheath was inserted without difficulty. The prostate had bilobar hypertrophy and a small median lobe.  The bladder was trabeculated.  There were no tumors and ureteral orifices were normal.  I started out with resection of the bladder neck and bladder floor and then took lateral lobes down the capsule.  At this point, he had a well excavated prostatic fossa.  Hemostasis was obtained by the use of the bipolar electrode.  Residual chips were removed from the bladder.  Ureteral orifices were intact.  As I removed the scope, the external sphincter was seen to be intact. Estimated  blood loss was negligible.  He tolerated the procedure well and he was taken to recovery room with a 24-French 30 cc Foley connected closed drainage and on traction.     Maretta Bees. Vonita Moss, M.D.     LJP/MEDQ  D:  09/22/2010  T:  09/22/2010  Job:  [id number]  cc:   Leslye Peer, MD [address] [place], [place] [address]  Electronically Signed by Larey Dresser M.D. on 09/30/2010 11:06:31 AM

## 2010-10-01 LAB — AFB CULTURE WITH SMEAR (NOT AT ARMC): Acid Fast Smear: NONE SEEN

## 2010-10-05 ENCOUNTER — Emergency Department (HOSPITAL_COMMUNITY)
Admission: EM | Admit: 2010-10-05 | Discharge: 2010-10-06 | Disposition: A | Discharge status: Home / Self Care | Payer: Medicare Other | Attending: Emergency Medicine | Admitting: Emergency Medicine

## 2010-10-05 ENCOUNTER — Emergency Department (HOSPITAL_COMMUNITY): Payer: Medicare Other

## 2010-10-05 DIAGNOSIS — T368X5A Adverse effect of other systemic antibiotics, initial encounter: Secondary | ICD-10-CM | POA: Insufficient documentation

## 2010-10-05 DIAGNOSIS — N390 Urinary tract infection, site not specified: Secondary | ICD-10-CM | POA: Insufficient documentation

## 2010-10-05 DIAGNOSIS — R0602 Shortness of breath: Secondary | ICD-10-CM | POA: Insufficient documentation

## 2010-10-05 DIAGNOSIS — J4489 Other specified chronic obstructive pulmonary disease: Secondary | ICD-10-CM | POA: Insufficient documentation

## 2010-10-05 DIAGNOSIS — R0609 Other forms of dyspnea: Secondary | ICD-10-CM | POA: Insufficient documentation

## 2010-10-05 DIAGNOSIS — Y929 Unspecified place or not applicable: Secondary | ICD-10-CM | POA: Insufficient documentation

## 2010-10-05 DIAGNOSIS — R0989 Other specified symptoms and signs involving the circulatory and respiratory systems: Secondary | ICD-10-CM | POA: Insufficient documentation

## 2010-10-05 DIAGNOSIS — J449 Chronic obstructive pulmonary disease, unspecified: Secondary | ICD-10-CM | POA: Insufficient documentation

## 2010-10-05 DIAGNOSIS — Z79899 Other long term (current) drug therapy: Secondary | ICD-10-CM | POA: Insufficient documentation

## 2010-10-05 DIAGNOSIS — R079 Chest pain, unspecified: Secondary | ICD-10-CM | POA: Insufficient documentation

## 2010-10-05 DIAGNOSIS — R42 Dizziness and giddiness: Secondary | ICD-10-CM | POA: Insufficient documentation

## 2010-11-03 NOTE — Consult Note (Signed)
NAME:  UNKNOWN, SCHLEYER NO.:  [id number]   MEDICAL RECORD NO.:  [id number]          PATIENT TYPE:  INP   LOCATION:  5705                         FACILITY:  [facility]   PHYSICIAN:  Coralyn Helling, MD        DATE OF BIRTH:  09-May-1928   DATE OF CONSULTATION:  12/09/2006  DATE OF DISCHARGE:                                 CONSULTATION   REFERRING PHYSICIAN:  Madaline Savage, M.D.   REASON FOR CONSULTATION:  COPD exacerbation.   Mr. Mans is a 77 year old male who was admitted on June 19th with a  COPD exacerbation.  He says that 2 days ago he had worsening of his  breathing with increased shortness of breath.  He denied having any  fever, chills, or sweats.  He says he has an occasional cough productive  of white-to-yellow sputum but he denied any hemoptysis.  He was having  some chest tightness associated with this but did not notice any  wheezing.  He also denied any abdominal pain, nausea, or vomiting, and  he has not had any skin rashes or leg swelling.  He did not have a  recent tick exposures.  He has not had any travel history.  He did not  have any significant exposures to animals or birds.  He says that since  being admitted to the hospital, he is actually starting to feel somewhat  better.,  He is able to walk on level ground fairly well.   PAST MEDICAL HISTORY:  1. COPD.  2. Hyperlipidemia.  3. He had a tracheotomy done as a child after choking on a nut.   He has no known drug allergies.   OUTPATIENT MEDICATIONS:  1. Zocor.  2. Advair 250/50, one puff b.i.d.  3. Spiriva 1 puff daily.  4. Albuterol inhaler as needed   FAMILY HISTORY:  Noncontributory.   SOCIAL HISTORY:  He smoked one pack of cigarettes per day for 55 years.  He is married.  He is a retired Airline pilot.  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   REVIEW OF SYSTEMS:  Unremarkable.   PHYSICAL EXAMINATION:  VITAL SIGNS:  Blood pressure is 123/57, heart  rate is 86,  oxygen saturation is 93% room air, temperature is 98.1,  respiratory rate is 17.  HEENT:  Pupils reactive.  Extraocular muscles intact.  There is no sinus  tenderness.  No nasal discharge.  No oral lesions.  No lymphadenopathy.  No jugular venous distention.  HEART:  S1-S2.  Regular rhythm.  CHEST:  He has decreased breath sounds with a prolonged expiratory phase  but there is no wheezing or rales.  ABDOMEN:  Soft, nontender, positive bowel sounds.  EXTREMITIES:  There was no edema, cyanosis, clubbing.  NEUROLOGIC:  No focal deficits were appreciated.   Sodium was 138, potassium 4.4, chloride is 108, CO2 is 22, BUN is 21,  creatinine is 1.1.  WBC 14.6, hemoglobin 13, hematocrit 38.7, platelet  count 196.  PH was 7.43, pCO2 was 32.9, pO2 was 67.  Chest x-ray from  June 19th, did not show any  acute lung process.   IMPRESSION:  Chronic obstructive pulmonary disease exacerbation.   He appears to be improving symptomatically.  I will decrease his dose of  Solu-Medrol there is 20 mg intravenously every 12 hours.  And, I suspect  that he will be able to have the switch to oral prednisone and taper off  fairly quickly.  I will also discontinue his theophylline which was  started earlier in the day.  The main concern that I have is that the  risk/benefit ratio related to the use of theophylline, I believe would  be not beneficial for the patient particularly since it seems like he is  improving symptomatically.  I would continue him on Advair at his  current dose, and I will reinitiate him on Spiriva.  I would discontinue  his Atrovent nebulizer and Ventolin nebulizer and start him on Ventolin  metered-dose inhalers.  I would also have him complete an antibiotic  course for approximately 5-7 days.  I have given him my contact  information and he can be scheduled to have a followup appointment with  me in approximately 2-3 weeks after hospital discharge, at which time I  would evaluate him for  possibly enrollment in pulmonary rehab.      Coralyn Helling, MD  Electronically Signed     VS/MEDQ  D:  12/09/2006  T:  12/10/2006  Job:  [id number]

## 2010-11-03 NOTE — H&P (Signed)
NAME:  [name], [name] NO.:  [id number]   MEDICAL RECORD NO.:  [id number]          PATIENT TYPE:  EMS   LOCATION:  MAJO                         FACILITY:  [facility]   PHYSICIAN:  Madaline Savage, MD        DATE OF BIRTH:  [birth year]   DATE OF ADMISSION:  12/08/2006  DATE OF DISCHARGE:                              HISTORY & PHYSICAL   PRIMARY CARE PHYSICIAN:  Dr. Salvatore Marvel at [place], [place].  The patient is  unassigned to [place].   CHIEF COMPLAINT:  Shortness of breath.   HISTORY OF PRESENT ILLNESS:  [name] is a pleasant 77 year old  gentleman who is out of town.  He complains of shortness of breath which  started a few hours ago.  He was diagnosed with COPD a couple of years  ago, and he has not had any exacerbations since then.  He noted that he  was short of breath yesterday, and it got progressively worse.  He also  had cough with clear and grey sputum production.  He did not have any  fever of chills.  After coming to the hospital, he is feeling much  better.   PAST MEDICAL HISTORY:  1. Chronic obstructive pulmonary disease, diagnosed a couple of years      ago.  2. Questionable history of hyperlipidemia.  He is on a statin.   PAST SURGICAL HISTORY:  He had a tracheostomy done as a child.   ALLERGIES:  NO KNOWN DRUG ALLERGIES.   DISCHARGE MEDICATIONS:  1. Zocor 5 mg daily.  2. Advair diskus 250/50 mg one puff twice daily.  3. Albuterol metered dose inhaler two puffs 4x daily as needed.  4. Spiriva one capsule daily.   SOCIAL HISTORY:  He lives with his wife.  He has a history of smoking  for approximately 55 years, and he has smoked on an average of one  pack/day.  He denies any history of alcohol or drug abuse.  He used to  be an Airline pilot.  They have no pets.  He has four children, all of them  are healthy.   FAMILY HISTORY:  Noncontributory.   REVIEW OF SYSTEMS:  GENERAL:  He denies any recent weight loss, weight  gain, no fever or chills.  HEENT:   No headaches, no blurred vision, no  sore throat.  CARDIOVASCULAR:  Denies chest pain, palpitations.  RESPIRATORY:  Cough with whitish sputum production.  He also complains  of shortness of breath.  GI:  No abdominal pain, nausea, vomiting,  diarrhea or constipation.  EXTREMITIES:  No tingling or numbness of toes  or fingers.   PHYSICAL EXAMINATION:  GENERAL:  Alert and oriented x3.  VITAL SIGNS:  Temperature 99, pulse rate 90, respiratory rate 18, blood  pressure 129/78.  HEENT:  Head normocephalic and atraumatic.  Pupils bilaterally equal  round and react to light.  Mucous membranes are moist.  NECK:  Supple.  No JVD.  No carotid bruits.  CARDIOVASCULAR:  S1, S2.  Regular rate and rhythm.  CHEST:  There is decreased air entry bilaterally.  There were scattered  wheezes bilaterally in lung fields.  ABDOMEN:  Soft.  Bowel sounds are heard.  EXTREMITIES:  No clubbing, cyanosis or edema.   LABORATORY STUDIES:  White count 5.1, hemoglobin 14.4, platelets 191.  Sodium 136, potassium 3.6, chloride 106, bicarb 22, BUN 12, creatinine  1.4, glucose 139.  Hix chest x-ray showed no acute abnormalities.   IMPRESSION:  1. Chronic obstructive pulmonary disease exacerbation.  2. Mild elevation of creatinine.   PLAN:  1. CHRONIC OBSTRUCTIVE PULMONARY DISEASE EXACERBATION.  This is a 59-      year-old gentleman who was diagnosed with COPD a couple of years      ago. He comes in with exacerbation.  We will admit him; put him on      IV Solu-Medrol, aerosols and oxygen.  Will monitor his progress.   At this time I do not see any sign of infection.  His white count is  normal.  He does not have any yellowish sputum production.  I would not  start him on an antibiotic at this.  Will continue to watch him.  I will  obtain cultures on him while he is in the hospital.  I would also get a  BNP, TSH and a lipid profile.  1. MILDLY ELEVATED CREATININE.  He has a creatinine of 1.4.  We do not      have  a baseline creatinine on  him.  I will hydrate him gently      overnight and I will repeat the labs on him.  He does not appear      dehydrated clinically at this time.  2. I will put him on DVT and GI prophylaxis.      Madaline Savage, MD  Electronically Signed     PKN/MEDQ  D:  12/08/2006  T:  12/08/2006  Job:  [id number]

## 2010-11-03 NOTE — Assessment & Plan Note (Signed)
[facility]                             PULMONARY OFFICE NOTE   [name], [name]                     MRN:          [id number]  DATE:12/29/2006                            DOB:          04/23/1928    I received a copy of Mr. Peed pulmonary function tests which were  done at [facility] from July 14, 2006, and this showed  that he had a FVC of 2.34 liters which is 66% of predicted, while the  FEV1 was 1.41 liters which was 51% predicted. The FEV1 to FVC ratio was  60%. There does not appear to be significant improvement after  bronchodilators. The total lung capacity was 7.3 liters which was 123%  predicted. There was evidence for air trapping and the diffusion  capacity was 75% of predicted. These findings would be consistent with  moderate to severe obstructive airways disease and suggestion of  restrictive disease, although the total lung capacity was actually  elevated.     Coralyn Helling, MD  Electronically Signed    VS/MedQ  DD: 01/01/2007  DT: 01/02/2007  Job #: [id number]

## 2010-11-03 NOTE — Discharge Summary (Signed)
NAME:  [name], [name] NO.:  [id number]   MEDICAL RECORD NO.:  [id number]          PATIENT TYPE:  INP   LOCATION:  5705                         FACILITY:  [facility]   PHYSICIAN:  Herbie Saxon, MDDATE OF BIRTH:  14-Dec-1927   DATE OF ADMISSION:  12/07/2006  DATE OF DISCHARGE:  12/12/2006                               DISCHARGE SUMMARY   CONSULTS:  Pulmonary.   RADIOLOGY:  Chest x-ray on December 07, 2006 was negative for acute cardiac  or pulmonary process.   HOSPITAL COURSE:  This 76 year old male presented to the emergency room  with shortness of breath which has been worsening over the past two  days, associated with cough productive of whitish-yellow sputum.  No  hemoptysis.  No wheezing or chest tightness.  He was admitted as a case  of COPD exacerbation and respiratory distress, started on IV steroids,  bronchodilators, and oxygen.  On the floor, he was noted to be having  desaturation on exertion.  Glycemic control was also uncontrolled on  steroids.  He was subsequently started on Actos and oxygen  supplementally.  The patient was also started on Avelox.  Dr. Craige Cotta,  pulmonologist, was consulted, and he reviewed his medical treatment.  The patient has been set up for home healthcare with oxygen and  nebulizers at home, and he is being discharged home in stable condition,  to follow up with his primary care physician at [facility].   DISCHARGE CONDITION:  Stable.   DIET:  Heart healthy, low cholesterol.   .  The patient was also started on a nicotine patch and counseled on  tobacco cessation.  The patient also recommended to avoid tobacco abuse.   MEDICATIONS ON DISCHARGE:  1. Zocor 5 mg at bedtime.  2. Advair 1 puff b.i.d.  3. Nicotine patch 21 mg per day.  4. albuterol2puffs q6hr prn  5. Nicotine patch 21 mg per day.  6. Prilosec 20 mg daily.  7. Tussionex 5 mL b.i.d.  8. Avelox 400 mg daily for 3 more days.  9. Actos 45 mg daily for 2  weeks.  10.Prednisone taper 40 mg daily for 4 days, then 30 mg daily for 4      days, then 20 mg daily for 4 days, then 10 mg daily for 2 days,      then discontinue.  11.Atrovent 2.5 mg q.6 h. p.r.n. via nebulizer.  12.Proventil treatment q.6 h. p.r.n. via nebulizers for shortness of      breath.  13.ambien 5mg  p.r.n. qhs  14.Spiriva 18 mcg 1 puff daily.  15.Xanax 0.25 mg b.i.d.  16.The patient has also been started on HCTZ 12.5 mg daily for his      mild hypertension.   PHYSICAL EXAMINATION:  GENERAL:  An elderly man, not in acute distress.  VITAL SIGNS:  Temperature 98, pulse 72, respiratory rate 18, blood  pressure 115/80.  HEENT:  Pupils equal and reactive to light and accommodation.  NECK:  Supple.  No elevated JVD.  CHEST:  Quite clear clinically.  HEART:  Sounds 1 and 2 regular.  ABDOMEN:  Benign.  EXTREMITIES:  No pedal edema.   LABORATORIES:  ABG on room air showed a pH of 7.4, bicarbonate is 26,  carbon dioxide 38, and oxygen 70.  Chemistry shows sodium 138, potassium  4.4, chloride 108, bicarbonate 22, glucose 165, BUN 21, creatinine 1.1.  WBC 14, hematocrit 38, platelet count 196.      Herbie Saxon, MD  Electronically Signed     MIO/MEDQ  D:  12/12/2006  T:  12/12/2006  Job:  [id number]

## 2010-11-03 NOTE — Assessment & Plan Note (Signed)
[facility]                             PULMONARY OFFICE NOTE   RILEN, SHUKLA                     MRN:          [id number]  DATE:12/29/2006                            DOB:          [birth year]    I saw Mr. Charland with his wife today in followup for his COPD.  He  says that since his hospital discharge, he has been doing quite well  with his symptoms.  He is not having much in regards to coughing,  wheezing or sputum production and he is able to do must of his  activities without much difficulty related to dyspnea.  He continues to  use supplemental oxygen at night, but is no longer using it during the  day.   CURRENT MEDICATIONS:  1. Zocor 5 mg daily.  2. Advair 250/50 one puff b.i.d.  3. Spiriva one puff daily.  4. Prilosec 20 mg daily.  5. Albuterol nebulizer b.i.d.  6. He also has an albuterol inhaler, but prefers to use the nebulizer.   REVIEW OF SYSTEMS:  He complains of having dizziness since being started  on Flomax and hydrochlorothiazide; he stopped both of these with  improvement in his dizziness.   PHYSICAL EXAMINATION:  He is 182 pounds.  Temperature is 97.7, blood  pressure is 116/62, heart rate 60 and oxygen saturation is 98% on room  air.  HEENT:  There is no sinus tenderness, no oral lesions.  No lymphadenopathy.  HEART:  S1 and S2.  CHEST:  There is no wheezing or rales.  ABDOMEN:  Soft and nontender.  EXTREMITIES:  There is no edema.   IMPRESSION:  1. Chronic obstructive pulmonary disease.  He is doing quite well on      his current regimen of Spiriva, Advair and albuterol nebulizer and      I would have him continue on this.  2. Hypoxemia.  His oxygen saturation at rest is within normal limits.      I will have him undergo an overnight oximetry without oxygen to      determine if he would need to be continued on supplemental oxygen      at night; if this is normal, then I will discontinue his      supplemental  oxygen.  3. Right lower lobe nodule on CT scan; this appears to be stable from      CT scan from December 20, 2006 and I would recommend followup CT in      approximately 6 months.   PLAN:  I will follow up with him in approximately 6 months.     Coralyn Helling, MD  Electronically Signed    VS/MedQ  DD: 12/29/2006  DT: 12/30/2006  Job #: [id number]

## 2010-11-03 NOTE — Assessment & Plan Note (Signed)
[facility]                             PULMONARY OFFICE NOTE   TEYON, ODETTE                     MRN:          [id number]  DATE:01/19/2007                            DOB:          Nov 05, 1927    I received the copy of Mr. Marone overnight oximetry from [facility].   This was done on January 10, 2007 and it showed that his highest  oxygenation was 98%, his lowest oxygenation was 86%.  His mean  oxygenation is 94%.  He spent 0.2% of the test time, which would be 1  minute, with an oxygen saturation less than 88% and the total recording  time was 7 hours, 41 minutes and 36 seconds.   Based on review of this, it does not appear that Mr. Nestle would need  supplemental oxygen at night.  I will therefore make arrangements for  this to be discontinued.     Coralyn Helling, MD  Electronically Signed    VS/MedQ  DD: 01/19/2007  DT: 01/20/2007  Job #: [id number]

## 2010-11-10 ENCOUNTER — Ambulatory Visit (INDEPENDENT_AMBULATORY_CARE_PROVIDER_SITE_OTHER)
Admission: RE | Admit: 2010-11-10 | Discharge: 2010-11-10 | Disposition: A | Discharge status: Home / Self Care | Payer: Medicare Other | Source: Ambulatory Visit | Attending: Pulmonary Disease | Admitting: Pulmonary Disease

## 2010-11-10 DIAGNOSIS — J984 Other disorders of lung: Secondary | ICD-10-CM

## 2010-11-10 DIAGNOSIS — R911 Solitary pulmonary nodule: Secondary | ICD-10-CM

## 2010-11-11 ENCOUNTER — Telehealth: Payer: Self-pay | Admitting: Pulmonary Disease

## 2010-11-11 NOTE — Telephone Encounter (Signed)
Dr Craige Cotta, pls advise results, thanks

## 2010-11-11 NOTE — Telephone Encounter (Signed)
Pt is coming in 5/29 at 9:00. Pt wife is aware of apt date and time

## 2010-11-11 NOTE — Telephone Encounter (Signed)
Results d/w pt and his wife over the phone regarding CT chest.  He will need follow up CT chest in 6 months to monitor nodules.  He is having more trouble with his breathing.  Will have my nurse schedule ROV to assess this in the next one to two weeks.

## 2010-11-17 ENCOUNTER — Encounter: Payer: Self-pay | Admitting: Pulmonary Disease

## 2010-11-17 ENCOUNTER — Ambulatory Visit (INDEPENDENT_AMBULATORY_CARE_PROVIDER_SITE_OTHER): Payer: Medicare Other | Admitting: Pulmonary Disease

## 2010-11-17 DIAGNOSIS — J449 Chronic obstructive pulmonary disease, unspecified: Secondary | ICD-10-CM

## 2010-11-17 DIAGNOSIS — J984 Other disorders of lung: Secondary | ICD-10-CM

## 2010-11-17 MED ORDER — PREDNISONE 10 MG PO TABS: ORAL_TABLET | ORAL | Status: DC

## 2010-11-17 MED ORDER — FLUTICASONE-SALMETEROL 500-50 MCG/DOSE IN AEPB: INHALATION_SPRAY | RESPIRATORY_TRACT | Status: DC

## 2010-11-17 NOTE — Patient Instructions (Signed)
Prednisone 10 mg pills: 2 pills for 2 days, 1 pill for 2 days, 1/2 pill for 2 days Change advair to 500/50 one puff twice per day Follow up in 4 to 6 weeks

## 2010-11-17 NOTE — Progress Notes (Signed)
Subjective:    Patient ID: [name], male    DOB: May 05, 1928, 77 y.o.   MRN: [id number]  HPI Copy to: Semret Mebrahtu, Larey Dresser  77 yo male recent quit smoking, with Pulmonary nodule, COPD.  He has been getting progressively more short of breath.  He has occasional cough but not much sputum.  He denies wheeze or chest tightness.  He does feel like his heart races when active.  This quickly resolves at rest.  He denies dizziness or chest pain.  He is not having leg swelling.  He has not been using ventolin much, but thinks he should be using this more.  Past Medical History  Diagnosis Date  . COPD (chronic obstructive pulmonary disease)   . Hyperlipidemia   . Hypothyroidism   . Pulmonary nodule, right   . Tobacco abuse      No family history on file.   History   Social History  . Marital Status: Married    Spouse Name: N/A    Number of Children: N/A  . Years of Education: N/A   Occupational History  . Not on file.   Social History Main Topics  . Smoking status: Former Smoker -- 1.0 packs/day for 55 years    Types: Cigarettes    Quit date: 09/17/2010  . Smokeless tobacco: Never Used  . Alcohol Use: Yes     rare wine drinker  . Drug Use: Not on file  . Sexually Active: Not on file   Other Topics Concern  . Not on file   Social History Narrative  . No narrative on file     Allergies  Allergen Reactions  . Sulfonamide Derivatives      No outpatient prescriptions prior to visit.   Review of Systems     Objective:   Physical Exam Filed Vitals:   11/17/10 0915  BP: 120/62  Pulse: 81  Temp: 97.9 F (36.6 C)  TempSrc: Oral  Height: 5\' 10"  (1.778 m)  Weight: 169 lb 6.4 oz (76.839 kg)  SpO2: 100%      General: normal appearance and healthy appearing.  Nose: no deformity, discharge, inflammation, or lesions  Mouth: no deformity or lesions  Neck: no JVD.  Lungs: clear bilaterally to auscultation and percussion  Heart: regular rate and  rhythm, S1, S2 without murmurs, rubs, gallops, or clicks  Extremities: no clubbing, cyanosis, edema, or deformity noted  Neurologic: normal CN II-XII.  Cervical Nodes: no significant adenopathy  CT CHEST WITHOUT CONTRAST  Technique: Multidetector CT imaging of the chest was performed following the standard protocol without IV contrast.  Comparison: August 07, 2010  Findings: The 3 mm nodule in the left upper lobe and the 6mm nodule in the superior segment of the right lower lobe are unchanged. The previously seen ill-defined nodular opacity in the lateral right lung base has resolved. However, there is a new 11 mm irregular nodule within the posterior medial right lower lobe now. There is no axillary, hilar, mediastinal adenopathy.  Atherosclerotic calcification is seen within the thoracic aorta and coronary arteries. The visualized upper abdomen and osseous structures are unremarkable.  IMPRESSION: Small, stable nodules in the left upper lobe and superior segment of the right lower lobe.  New nodule in the posterior medial aspect of the right lower lobe. Continued close follow-up in 4 - 6 months is recommended.  Spirometry 11/17/10>>FEV1 1.15(36%), FEV1% 56  Assessment & Plan:   PULMONARY NODULE, RIGHT LOWER LOBE He has CT chest findings as  detailed above.  He will need follow up CT chest in November 2012.  CHRONIC OBSTRUCTIVE PULMONARY DISEASE He has increased dyspnea with exertion and decrease in FEV1 from Feb. 2012 to today.  I will change his advair to 500/50 one puff bid and give him a course of prednisone.  Advised him to call in few weeks if his breathing is not improving with this regimen.  If he does not improve, then advise he may need additional cardiac evaluation.    Updated Medication List Outpatient Encounter Prescriptions as of 11/17/2010  Medication Sig Dispense Refill  . albuterol (VENTOLIN HFA) 108 (90 BASE) MCG/ACT inhaler Inhale 2 puffs into the lungs  every 4 (four) hours as needed.        Marland Kitchen levothyroxine (SYNTHROID, LEVOTHROID) 25 MCG tablet Take 25 mcg by mouth daily.        . simvastatin (ZOCOR) 5 MG tablet Take 5 mg by mouth at bedtime.        Marland Kitchen tiotropium (SPIRIVA) 18 MCG inhalation capsule Place 18 mcg into inhaler and inhale daily.        Marland Kitchen DISCONTD: Fluticasone-Salmeterol (ADVAIR DISKUS) 250-50 MCG/DOSE AEPB Inhale 1 puff into the lungs every 12 (twelve) hours.        . Fluticasone-Salmeterol (ADVAIR DISKUS) 500-50 MCG/DOSE AEPB One puff twice per day  1 each  5  . predniSONE (DELTASONE) 10 MG tablet 2 pills for 2 days, 1 pill for 2 days, 1/2 pill for 2 days  7 tablet  0

## 2010-11-17 NOTE — Assessment & Plan Note (Signed)
He has increased dyspnea with exertion and decrease in FEV1 from Feb. 2012 to today.  I will change his advair to 500/50 one puff bid and give him a course of prednisone.  Advised him to call in few weeks if his breathing is not improving with this regimen.  If he does not improve, then advise he may need additional cardiac evaluation.

## 2010-11-17 NOTE — Assessment & Plan Note (Signed)
He has CT chest findings as detailed above.  He will need follow up CT chest in November 2012.

## 2010-11-23 ENCOUNTER — Ambulatory Visit: Payer: Medicare Other | Admitting: Pulmonary Disease

## 2011-01-13 ENCOUNTER — Ambulatory Visit (INDEPENDENT_AMBULATORY_CARE_PROVIDER_SITE_OTHER): Payer: Medicare Other | Admitting: Pulmonary Disease

## 2011-01-13 ENCOUNTER — Encounter: Payer: Self-pay | Admitting: Pulmonary Disease

## 2011-01-13 DIAGNOSIS — J984 Other disorders of lung: Secondary | ICD-10-CM

## 2011-01-13 DIAGNOSIS — J4489 Other specified chronic obstructive pulmonary disease: Secondary | ICD-10-CM

## 2011-01-13 DIAGNOSIS — J449 Chronic obstructive pulmonary disease, unspecified: Secondary | ICD-10-CM

## 2011-01-13 NOTE — Assessment & Plan Note (Signed)
He has increased dyspnea with exertion and decrease in FEV1 from Feb. 2012 to today.  He has some improvement with increase in Advair, but still is getting winded easily with exertion.  I have advised him to d/w primary about whether he should have cardiology evaluation.  He is to continue his current inhaler regimen.

## 2011-01-13 NOTE — Assessment & Plan Note (Signed)
He has CT chest findings as detailed above.  He will need follow up CT chest in November 2012.

## 2011-01-13 NOTE — Patient Instructions (Signed)
Follow up in November 2012

## 2011-01-13 NOTE — Progress Notes (Signed)
  Subjective:    Patient ID: [name], male    DOB: Feb 03, 1928, 77 y.o.   MRN: [id number]  HPI 77 yo male recent quit smoking, with Pulmonary nodule, COPD.  He denies cough, wheeze, sputum, chest pain, or chest tightness.  He gets winded easily with activity.  This gets better quickly when he rests.  Review of Systems     Objective:   Physical Exam  BP 134/62  Pulse 81  Temp(Src) 98.1 F (36.7 C) (Oral)  Ht 5\' 10"  (1.778 m)  Wt 171 lb (77.565 kg)  BMI 24.54 kg/m2  SpO2 97%  General: normal appearance and healthy appearing.  Nose: no deformity, discharge, inflammation, or lesions  Mouth: no deformity or lesions  Neck: no JVD.  Lungs: clear bilaterally to auscultation and percussion  Heart: regular rate and rhythm, S1, S2 without murmurs, rubs, gallops, or clicks  Extremities: no clubbing, cyanosis, edema, or deformity noted  Neurologic: normal CN II-XII.  Cervical Nodes: no significant adenopathy        Assessment & Plan:   PULMONARY NODULE, RIGHT LOWER LOBE He has CT chest findings as detailed above.  He will need follow up CT chest in November 2012.    CHRONIC OBSTRUCTIVE PULMONARY DISEASE He has increased dyspnea with exertion and decrease in FEV1 from Feb. 2012 to today.  He has some improvement with increase in Advair, but still is getting winded easily with exertion.  I have advised him to d/w primary about whether he should have cardiology evaluation.  He is to continue his current inhaler regimen.      Updated Medication List Outpatient Encounter Prescriptions as of 01/13/2011  Medication Sig Dispense Refill  . albuterol (VENTOLIN HFA) 108 (90 BASE) MCG/ACT inhaler Inhale 2 puffs into the lungs every 4 (four) hours as needed.        . Fluticasone-Salmeterol (ADVAIR DISKUS) 500-50 MCG/DOSE AEPB One puff twice per day  1 each  5  . levothyroxine (SYNTHROID, LEVOTHROID) 25 MCG tablet Take 25 mcg by mouth daily.        . simvastatin (ZOCOR) 5 MG tablet  Take 5 mg by mouth at bedtime.        Marland Kitchen tiotropium (SPIRIVA) 18 MCG inhalation capsule Place 18 mcg into inhaler and inhale daily.        Marland Kitchen DISCONTD: predniSONE (DELTASONE) 10 MG tablet 2 pills for 2 days, 1 pill for 2 days, 1/2 pill for 2 days  7 tablet  0

## 2011-04-07 ENCOUNTER — Telehealth: Payer: Self-pay | Admitting: Pulmonary Disease

## 2011-04-07 DIAGNOSIS — R911 Solitary pulmonary nodule: Secondary | ICD-10-CM

## 2011-04-07 LAB — BLOOD GAS, ARTERIAL
Acid-Base Excess: 0.9
Acid-base deficit: 2
Bicarbonate: 24.8 — ABNORMAL HIGH
FIO2: 0.21
O2 Content: 21
O2 Saturation: 94.5
Patient temperature: 98.6
TCO2: 25.9
pCO2 arterial: 32.9 — ABNORMAL LOW
pCO2 arterial: 38
pH, Arterial: 7.43
pH, Arterial: 7.433
pO2, Arterial: 67.1 — ABNORMAL LOW
pO2, Arterial: 70.8 — ABNORMAL LOW

## 2011-04-07 LAB — DIFFERENTIAL
Basophils Relative: 0
Eosinophils Absolute: 0
Eosinophils Absolute: 0
Eosinophils Relative: 0
Lymphocytes Relative: 15
Lymphs Abs: 0.3 — ABNORMAL LOW
Lymphs Abs: 0.7
Monocytes Absolute: 0.1 — ABNORMAL LOW
Monocytes Relative: 2 — ABNORMAL LOW
Neutrophils Relative %: 82 — ABNORMAL HIGH
Neutrophils Relative %: 87 — ABNORMAL HIGH

## 2011-04-07 LAB — BASIC METABOLIC PANEL
CO2: 22
CO2: 24
Calcium: 8.4
Chloride: 103
Creatinine, Ser: 1.19
Creatinine, Ser: 1.43
GFR calc Af Amer: 58 — ABNORMAL LOW
GFR calc Af Amer: >60
GFR calc non Af Amer: 59 — ABNORMAL LOW
Glucose, Bld: 165 — ABNORMAL HIGH
Potassium: 4.1
Sodium: 138

## 2011-04-07 LAB — LIPID PANEL
LDL Cholesterol: 75
Total CHOL/HDL Ratio: 3.9
VLDL: 10

## 2011-04-07 LAB — CBC
HCT: 38.8 — ABNORMAL LOW
Hemoglobin: 13.2
MCHC: 33.7
MCHC: 34.1
MCV: 90.9
MCV: 92.4
Platelets: 191
RBC: 4.13 — ABNORMAL LOW
RBC: 4.27
RDW: 13.7
WBC: 2.9 — ABNORMAL LOW
WBC: 5.1

## 2011-04-07 LAB — URINALYSIS, MICROSCOPIC ONLY
Glucose, UA: >1000 — AB
Leukocytes, UA: NEGATIVE
Nitrite: NEGATIVE
Protein, ur: NEGATIVE
Urobilinogen, UA: 0.2

## 2011-04-07 LAB — I-STAT 8, (EC8 V) (CONVERTED LAB)
BUN: 12
Bicarbonate: 21.1
HCT: 45
Hemoglobin: 15.3
Operator id: 277751
Sodium: 136

## 2011-04-07 LAB — CULTURE, BLOOD (ROUTINE X 2)

## 2011-04-07 LAB — GLUCOSE, RANDOM: Glucose, Bld: 144 — ABNORMAL HIGH

## 2011-04-07 LAB — B-NATRIURETIC PEPTIDE (CONVERTED LAB): Pro B Natriuretic peptide (BNP): <30

## 2011-04-07 LAB — POCT I-STAT CREATININE: Creatinine, Ser: 1.4

## 2011-04-07 LAB — TSH: TSH: 1.565

## 2011-04-07 NOTE — Telephone Encounter (Signed)
According to 01/13/2011 OV, Dr. Craige Cotta wanted pt to have repeat CT chest in November. Pt is scheduled for f/u on 04/28/2011 and Ct will need to be done prior to that visit. Will send an order to [facility].

## 2011-04-28 ENCOUNTER — Ambulatory Visit (INDEPENDENT_AMBULATORY_CARE_PROVIDER_SITE_OTHER)
Admission: RE | Admit: 2011-04-28 | Discharge: 2011-04-28 | Disposition: A | Discharge status: Home / Self Care | Payer: Medicare Other | Source: Ambulatory Visit | Attending: Pulmonary Disease | Admitting: Pulmonary Disease

## 2011-04-28 DIAGNOSIS — R911 Solitary pulmonary nodule: Secondary | ICD-10-CM

## 2011-04-28 DIAGNOSIS — J984 Other disorders of lung: Secondary | ICD-10-CM

## 2011-04-30 ENCOUNTER — Encounter: Payer: Self-pay | Admitting: Pulmonary Disease

## 2011-04-30 ENCOUNTER — Ambulatory Visit (INDEPENDENT_AMBULATORY_CARE_PROVIDER_SITE_OTHER): Payer: Medicare Other | Admitting: Pulmonary Disease

## 2011-04-30 VITALS — BP 148/66 | HR 60 | Temp 97.5°F | Ht 70.0 in | Wt 176.4 lb

## 2011-04-30 DIAGNOSIS — J9610 Chronic respiratory failure, unspecified whether with hypoxia or hypercapnia: Secondary | ICD-10-CM

## 2011-04-30 DIAGNOSIS — R0902 Hypoxemia: Secondary | ICD-10-CM

## 2011-04-30 DIAGNOSIS — Z23 Encounter for immunization: Secondary | ICD-10-CM

## 2011-04-30 DIAGNOSIS — J449 Chronic obstructive pulmonary disease, unspecified: Secondary | ICD-10-CM

## 2011-04-30 DIAGNOSIS — J984 Other disorders of lung: Secondary | ICD-10-CM

## 2011-04-30 DIAGNOSIS — J9611 Chronic respiratory failure with hypoxia: Secondary | ICD-10-CM

## 2011-04-30 NOTE — Assessment & Plan Note (Addendum)
He has continue dyspnea with strenuous exertion, likely related to oxygen desaturation in setting of COPD.  Advised him to also discuss with primary care about whether he needs further cardiovascular evaluation.    Otherwise his COPD seems stable on current inhaler regimen.  Administered influenza vaccine today.

## 2011-04-30 NOTE — Patient Instructions (Signed)
Flu shot today Will schedule CT chest for February 2013 and follow up after that

## 2011-04-30 NOTE — Assessment & Plan Note (Signed)
He has new nodule in left upper lobe.  Will arrange for follow up CT chest w/o contrast for February 2013.

## 2011-04-30 NOTE — Progress Notes (Signed)
Addended by: Tommie Sams on: 04/30/2011 03:11 PM   Modules accepted: Orders

## 2011-04-30 NOTE — Progress Notes (Signed)
Chief Complaint  Patient presents with  . Follow-up    Pt had ct 04/28/11. Pt states his breathing has been "okay". Pt denies any cough. Pt has no complaints today. Pt would like  flu shot    History of Present Illness:  [name] is a 77 y.o. male former smoker with with Pulmonary nodule, COPD.  He is here to follow up his CT chest.  Details are listed below.  He feels his inhalers help some.  He is not using albuterol much.  He does not have much cough, wheeze, or sputum.  He still gets winded easily when he does strenuous exertion (ie raking leaves).  He has also noticed numbness and discomfort in his feet when he walks and sits.  Past Medical History  Diagnosis Date  . COPD (chronic obstructive pulmonary disease)   . Hyperlipidemia   . Hypothyroidism   . Pulmonary nodule, right   . Tobacco abuse     Quit 09/17/10    Past Surgical History  Procedure Date  . Tracheostomy     after aspiration event  . Electronavigational bronchoscopy 07/2010    Current Outpatient Prescriptions on File Prior to Visit  Medication Sig Dispense Refill  . albuterol (VENTOLIN HFA) 108 (90 BASE) MCG/ACT inhaler Inhale 2 puffs into the lungs every 4 (four) hours as needed.        . Fluticasone-Salmeterol (ADVAIR DISKUS) 500-50 MCG/DOSE AEPB One puff twice per day  1 each  5  . levothyroxine (SYNTHROID, LEVOTHROID) 25 MCG tablet Take 25 mcg by mouth daily.        . simvastatin (ZOCOR) 5 MG tablet Take 5 mg by mouth at bedtime.        Marland Kitchen tiotropium (SPIRIVA) 18 MCG inhalation capsule Place 18 mcg into inhaler and inhale daily.          Allergies  Allergen Reactions  . Sulfonamide Derivatives     Physical Exam:  Blood pressure 148/66, pulse 60, temperature 97.5 F (36.4 C), temperature source Oral, height 5\' 10"  (1.778 m), weight 176 lb 6.4 oz (80.015 kg), SpO2 95.00%.  General: normal appearance and healthy appearing.  Nose: no deformity, discharge, inflammation, or lesions  Mouth: no  deformity or lesions  Neck: no JVD.  Lungs: clear bilaterally to auscultation and percussion  Heart: regular rate and rhythm, S1, S2 without murmurs, rubs, gallops, or clicks  Extremities: no clubbing, cyanosis, edema, or deformity noted  Neurologic: normal CN II-XII.  Cervical Nodes: no significant adenopathy  Ct Chest Wo Contrast  04/28/2011  *RADIOLOGY REPORT*  Clinical Data: Pulmonary nodules, follow-up  CT CHEST WITHOUT CONTRAST  Technique:  Multidetector CT imaging of the chest was performed following the standard protocol without IV contrast.  Comparison: 11/10/2010  Findings: There is a low density nodule in the left lobe of the thyroid gland measuring 1 cm, image #5.  There are no enlarged axillary or supraclavicular lymph nodes.  There are no enlarged mediastinal or hilar lymph nodes.  No pericardial or pleural effusions.  The lungs are emphysematous.  There is a new pulmonary nodule within the left upper lobe with spiculated margins measuring 10.3 mm, image 18.  Right lower lobe pulmonary nodule is unchanged from previous exam measuring 0.7 cm.  There is a bronchiectasis identified within the lower lobes.  Patchy ground-glass densities are noted within the right posterior lung base.  Review of the visualized osseous structures is significant for mild multilevel thoracic spondylosis.  Limited imaging through the upper  abdomen is negative.   IMPRESSION:  1.  No acute cardiopulmonary abnormalities. 2.  New pulmonary nodule in the left upper lobe measures 10.30 cm. Indeterminate.  May be related to inflammation or infection. Malignancy not excluded.  Management strategies include further evaluation with PET CT or short-term follow-up examination in 3 months. 2.  Other small nodules are stable from previous exam.   Original Report Authenticated By: Rosealee Albee, M.D.     Assessment/Plan:  PULMONARY NODULE, RIGHT LOWER LOBE He has new nodule in left upper lobe.  Will arrange for follow up  CT chest w/o contrast for February 2013.  CHRONIC OBSTRUCTIVE PULMONARY DISEASE He has continue dyspnea with strenuous exertion, likely related to oxygen desaturation in setting of COPD.  Advised him to also discuss with primary care about whether he needs further cardiovascular evaluation.    Otherwise his COPD seems stable on current inhaler regimen.  Administered influenza vaccine today.      Chronic respiratory failure with hypoxia He had oxygen desaturation to 85% on room air with exertion today.  Will arrange for 2 liters oxygen with exertion and sleep.  Will arrange for ONO on 2 liters.     Outpatient Encounter Prescriptions as of 04/30/2011  Medication Sig Dispense Refill  . albuterol (VENTOLIN HFA) 108 (90 BASE) MCG/ACT inhaler Inhale 2 puffs into the lungs every 4 (four) hours as needed.        . Fluticasone-Salmeterol (ADVAIR DISKUS) 500-50 MCG/DOSE AEPB One puff twice per day  1 each  5  . levothyroxine (SYNTHROID, LEVOTHROID) 25 MCG tablet Take 25 mcg by mouth daily.        . simvastatin (ZOCOR) 5 MG tablet Take 5 mg by mouth at bedtime.        Marland Kitchen tiotropium (SPIRIVA) 18 MCG inhalation capsule Place 18 mcg into inhaler and inhale daily.          Ananth Fiallos Pager:  [phone] 04/30/2011, 3:00 PM

## 2011-04-30 NOTE — Assessment & Plan Note (Signed)
He had oxygen desaturation to 85% on room air with exertion today.  Will arrange for 2 liters oxygen with exertion and sleep.  Will arrange for ONO on 2 liters.

## 2011-05-09 ENCOUNTER — Other Ambulatory Visit: Payer: Self-pay | Admitting: Pulmonary Disease

## 2011-07-30 ENCOUNTER — Ambulatory Visit (INDEPENDENT_AMBULATORY_CARE_PROVIDER_SITE_OTHER)
Admission: RE | Admit: 2011-07-30 | Discharge: 2011-07-30 | Disposition: A | Discharge status: Home / Self Care | Payer: Medicare Other | Source: Ambulatory Visit | Attending: Pulmonary Disease | Admitting: Pulmonary Disease

## 2011-07-30 DIAGNOSIS — J984 Other disorders of lung: Secondary | ICD-10-CM

## 2011-08-05 ENCOUNTER — Telehealth: Payer: Self-pay | Admitting: Pulmonary Disease

## 2011-08-05 NOTE — Telephone Encounter (Signed)
Ct Chest Wo Contrast  07/30/2011  *RADIOLOGY REPORT*  Clinical Data: Shortness of breath, follow up pulmonary nodule  CT CHEST WITHOUT CONTRAST  Technique:  Multidetector CT imaging of the chest was performed following the standard protocol without IV contrast.   Comparison: 04/28/2011   Findings: 8 x 6 mm left upper lobe pulmonary nodule (series 3/image 17), possibly minimally decreased.  8 x 6 mm right lower lobe pulmonary nodule (series 3/image 32), unchanged.  Calcified nodule along the right major fissure (series 3/image 34), benign.  Mild patchy opacities in the medial right lower lobe (series 3/images 41 and 44), likely infectious/inflammatory.  Mild centrilobular emphysematous changes. No pleural effusion or pneumothorax.  Stable left thyroid nodule.  The heart is normal in size.  No pericardial effusion.  Coronary atherosclerosis.  Atherosclerotic calcifications of the aortic arch.  Small subcentimeter mediastinal lymph nodes which do not meet pathologic CT size criteria.  No axillary lymphadenopathy.  Visualized to upper abdomen is notable for vascular calcifications.  Mild degenerative changes of the visualized thoracolumbar spine.  I MPRESSION: 8 x 6 mm left upper lobe pulmonary nodule, possibly minimally decreased. Follow-up CT in 3-6 months is suggested.  Mild patchy opacities in the medial right lower lobe, likely infectious/inflammatory.  Additional pulmonary nodules are unchanged.  Mild centrilobular emphysematous changes.   Original Report Authenticated By: Charline Bills, M.D.   Results d/w pt over the phone.  Will address in more detail at [facility] 08/17/11.

## 2011-08-17 ENCOUNTER — Ambulatory Visit (INDEPENDENT_AMBULATORY_CARE_PROVIDER_SITE_OTHER): Payer: Medicare Other | Admitting: Pulmonary Disease

## 2011-08-17 ENCOUNTER — Encounter: Payer: Self-pay | Admitting: Pulmonary Disease

## 2011-08-17 VITALS — BP 122/72 | HR 81 | Temp 96.6°F | Ht 70.0 in | Wt 189.6 lb

## 2011-08-17 DIAGNOSIS — R0902 Hypoxemia: Secondary | ICD-10-CM

## 2011-08-17 DIAGNOSIS — J449 Chronic obstructive pulmonary disease, unspecified: Secondary | ICD-10-CM

## 2011-08-17 DIAGNOSIS — J984 Other disorders of lung: Secondary | ICD-10-CM

## 2011-08-17 DIAGNOSIS — J4489 Other specified chronic obstructive pulmonary disease: Secondary | ICD-10-CM

## 2011-08-17 DIAGNOSIS — J9611 Chronic respiratory failure with hypoxia: Secondary | ICD-10-CM

## 2011-08-17 DIAGNOSIS — J9610 Chronic respiratory failure, unspecified whether with hypoxia or hypercapnia: Secondary | ICD-10-CM

## 2011-08-17 MED ORDER — BUDESONIDE-FORMOTEROL FUMARATE 160-4.5 MCG/ACT IN AERO: 2.0000 | INHALATION_SPRAY | Freq: Two times a day (BID) | RESPIRATORY_TRACT | Status: DC

## 2011-08-17 NOTE — Assessment & Plan Note (Signed)
Will change advair to symbicort for insurance purposes.  He is to continue spiriva and prn albuterol.

## 2011-08-17 NOTE — Patient Instructions (Signed)
Stop advair Symbicort two puffs twice per day, and rinse mouth after using Follow up in 6 months after CT chest

## 2011-08-17 NOTE — Progress Notes (Signed)
Chief Complaint  Patient presents with  . Follow-up    3 month follow up - reports breathing is unchanged since last visit.  no new complaints.  would like to discuss alternatives to adviar d/t increase in copay    History of Present Illness:  [name] is a 78 y.o. male former smoker with with Pulmonary nodule, COPD.  His breathing has been doing okay.  He still gets winded with activity.  He is able to keep up with most of his activities.  He has not needed to use albuterol much.  He had his insurance changed, and advair is now tier 3.     Past Medical History  Diagnosis Date  . COPD (chronic obstructive pulmonary disease)   . Hyperlipidemia   . Hypothyroidism   . Pulmonary nodule, right   . Tobacco abuse     Quit 09/17/10    Past Surgical History  Procedure Date  . Tracheostomy     after aspiration event  . Electronavigational bronchoscopy 07/2010    Current Outpatient Prescriptions on File Prior to Visit  Medication Sig Dispense Refill  . ADVAIR DISKUS 500-50 MCG/DOSE AEPB INHALE ONE PUFF BY MOUTH TWICE DAILY  60 each  3  . albuterol (VENTOLIN HFA) 108 (90 BASE) MCG/ACT inhaler Inhale 2 puffs into the lungs every 4 (four) hours as needed.        . simvastatin (ZOCOR) 5 MG tablet Take 5 mg by mouth at bedtime.        Marland Kitchen tiotropium (SPIRIVA) 18 MCG inhalation capsule Place 18 mcg into inhaler and inhale daily.          Allergies  Allergen Reactions  . Sulfonamide Derivatives     Physical Exam:  Blood pressure 122/72, pulse 81, temperature 96.6 F (35.9 C), temperature source Oral, height 5\' 10"  (1.778 m), weight 189 lb 9.6 oz (86.002 kg), SpO2 100.00%.  General: normal appearance and healthy appearing.  Nose: no deformity, discharge, inflammation, or lesions  Mouth: no deformity or lesions  Neck: no JVD.  Lungs: clear bilaterally to auscultation and percussion  Heart: regular rate and rhythm, S1, S2 without murmurs, rubs, gallops, or clicks  Extremities:  no clubbing, cyanosis, edema, or deformity noted  Neurologic: normal CN II-XII.  Cervical Nodes: no significant adenopathy  Ct Chest Wo Contrast  07/30/2011 *RADIOLOGY REPORT* Clinical Data: Shortness of breath, follow up pulmonary nodule CT CHEST WITHOUT CONTRAST Technique: Multidetector CT imaging of the chest was performed following the standard protocol without IV contrast.  Comparison: 04/28/2011  Findings: 8 x 6 mm left upper lobe pulmonary nodule (series 3/image 17), possibly minimally decreased. 8 x 6 mm right lower lobe pulmonary nodule (series 3/image 32), unchanged. Calcified nodule along the right major fissure (series 3/image 34), benign. Mild patchy opacities in the medial right lower lobe (series 3/images 41 and 44), likely infectious/inflammatory. Mild centrilobular emphysematous changes. No pleural effusion or pneumothorax. Stable left thyroid nodule. The heart is normal in size. No pericardial effusion. Coronary atherosclerosis. Atherosclerotic calcifications of the aortic arch. Small subcentimeter mediastinal lymph nodes which do not meet pathologic CT size criteria. No axillary lymphadenopathy. Visualized to upper abdomen is notable for vascular calcifications. Mild degenerative changes of the visualized thoracolumbar spine. I  MPRESSION: 8 x 6 mm left upper lobe pulmonary nodule, possibly minimally decreased. Follow-up CT in 3-6 months is suggested. Mild patchy opacities in the medial right lower lobe, likely infectious/inflammatory. Additional pulmonary nodules are unchanged. Mild centrilobular emphysematous changes.  Original Report  Authenticated By: Charline Bills, M.D.     Assessment/Plan:   Outpatient Encounter Prescriptions as of 08/17/2011  Medication Sig Dispense Refill  . ADVAIR DISKUS 500-50 MCG/DOSE AEPB INHALE ONE PUFF BY MOUTH TWICE DAILY  60 each  3  . albuterol (VENTOLIN HFA) 108 (90 BASE) MCG/ACT inhaler Inhale 2 puffs into the lungs every 4 (four) hours as  needed.        Marland Kitchen levothyroxine (SYNTHROID, LEVOTHROID) 50 MCG tablet Take 50 mcg by mouth daily.      . simvastatin (ZOCOR) 5 MG tablet Take 5 mg by mouth at bedtime.        Marland Kitchen tiotropium (SPIRIVA) 18 MCG inhalation capsule Place 18 mcg into inhaler and inhale daily.        Marland Kitchen DISCONTD: levothyroxine (SYNTHROID, LEVOTHROID) 25 MCG tablet Take 25 mcg by mouth daily.          [name] Pager:  [phone] 08/17/2011, 4:25 PM

## 2011-08-17 NOTE — Assessment & Plan Note (Addendum)
CT chest findings as detailed above.  Will need follow up CT chest w/o contrast in 6 months.  If stable, then will need to evaluate whether further radiographic follow up is beneficial.

## 2011-08-17 NOTE — Assessment & Plan Note (Signed)
He is to continue 2 liters oxygen with exertion and sleep.

## 2011-09-15 ENCOUNTER — Encounter: Payer: Self-pay | Admitting: Pulmonary Disease

## 2011-09-15 ENCOUNTER — Ambulatory Visit (INDEPENDENT_AMBULATORY_CARE_PROVIDER_SITE_OTHER): Payer: Medicare Other | Admitting: Pulmonary Disease

## 2011-09-15 VITALS — BP 122/76 | HR 82 | Temp 97.8°F | Ht 70.0 in | Wt 182.0 lb

## 2011-09-15 DIAGNOSIS — J9611 Chronic respiratory failure with hypoxia: Secondary | ICD-10-CM

## 2011-09-15 DIAGNOSIS — J449 Chronic obstructive pulmonary disease, unspecified: Secondary | ICD-10-CM

## 2011-09-15 DIAGNOSIS — J984 Other disorders of lung: Secondary | ICD-10-CM

## 2011-09-15 DIAGNOSIS — R0902 Hypoxemia: Secondary | ICD-10-CM

## 2011-09-15 DIAGNOSIS — J9610 Chronic respiratory failure, unspecified whether with hypoxia or hypercapnia: Secondary | ICD-10-CM

## 2011-09-15 DIAGNOSIS — J4489 Other specified chronic obstructive pulmonary disease: Secondary | ICD-10-CM

## 2011-09-15 MED ORDER — DOXYCYCLINE HYCLATE 100 MG PO CAPS: 100.0000 mg | ORAL_CAPSULE | Freq: Two times a day (BID) | ORAL | Status: AC

## 2011-09-15 MED ORDER — ALBUTEROL SULFATE (2.5 MG/3ML) 0.083% IN NEBU
2.5000 mg | INHALATION_SOLUTION | Freq: Once | RESPIRATORY_TRACT | Status: AC
  Administered 2011-09-15: 2.5 mg via RESPIRATORY_TRACT

## 2011-09-15 MED ORDER — PREDNISONE 10 MG PO TABS: ORAL_TABLET | ORAL | Status: DC

## 2011-09-15 NOTE — Progress Notes (Signed)
Addended by: Tommie Sams on: 09/15/2011 12:05 PM   Modules accepted: Orders

## 2011-09-15 NOTE — Assessment & Plan Note (Signed)
Will need follow up CT chest w/o contrast in August 2013.  If stable, then will need to evaluate whether further radiographic follow up is beneficial.

## 2011-09-15 NOTE — Assessment & Plan Note (Addendum)
He is to continue 2 liters oxygen with exertion and sleep.  He is reluctant to use oxygen during the day.

## 2011-09-15 NOTE — Progress Notes (Signed)
Chief Complaint  Patient presents with  . acute visit    Pt states the symbicort has not been helping his breathing--increase SOB w/ exertion, cough w/ yellow phlem, wheezing, chest tightness, nasal congestion, dizziness, little sore throat. denies any fever, chills, sweats    History of Present Illness:  [name] is a 78 y.o. male former smoker with with Pulmonary nodule, COPD.  He switched from advair to symbicort two weeks ago.  Over the past 3 or 4 days he has more cough with yellow sputum, chest congestion, and wheeze.  He is getting more short of breath.  His wife is being treated for a bronchitis.  He has been using his ventolin more over the past few days.  He uses his oxygen at night.  He uses spiriva w/o difficulty.  He denies sinus congestion, sore throat, ear pain, gland swelling, chest pain, palpitations, fever, skin rash, abdominal symptoms, or leg swelling.   Past Medical History  Diagnosis Date  . COPD (chronic obstructive pulmonary disease)   . Hyperlipidemia   . Hypothyroidism   . Pulmonary nodule, right   . Tobacco abuse     Quit 09/17/10    Past Surgical History  Procedure Date  . Tracheostomy     after aspiration event  . Electronavigational bronchoscopy 07/2010    Current Outpatient Prescriptions on File Prior to Visit  Medication Sig Dispense Refill  . albuterol (VENTOLIN HFA) 108 (90 BASE) MCG/ACT inhaler Inhale 2 puffs into the lungs every 4 (four) hours as needed.        . budesonide-formoterol (SYMBICORT) 160-4.5 MCG/ACT inhaler Inhale 2 puffs into the lungs 2 (two) times daily.  1 Inhaler  12  . levothyroxine (SYNTHROID, LEVOTHROID) 50 MCG tablet Take 50 mcg by mouth daily.      . simvastatin (ZOCOR) 5 MG tablet Take 5 mg by mouth at bedtime.        Marland Kitchen tiotropium (SPIRIVA) 18 MCG inhalation capsule Place 18 mcg into inhaler and inhale daily.          Allergies  Allergen Reactions  . Sulfonamide Derivatives     Physical Exam:  Blood  pressure 122/76, pulse 82, temperature 97.8 F (36.6 C), temperature source Oral, height 5\' 10"  (1.778 m), weight 182 lb (82.555 kg), SpO2 96.00%.  General - increased WOB, using some accessory muscles, speaks in full sentences HEENT - no sinus tenderness, no oral exudate, no LAN, TM clear with mild cerumen build up Cardiac - s1s2 regular  Chest - prolonged exhalation, b/l expiratory wheeze, no rales/dullness>>improved after nebulizer treatment Abd - soft, nontender Ext - no edema Neuro - normal strength Psych - normal mood, behavior  Assessment/Plan:   Outpatient Encounter Prescriptions as of 09/15/2011  Medication Sig Dispense Refill  . albuterol (VENTOLIN HFA) 108 (90 BASE) MCG/ACT inhaler Inhale 2 puffs into the lungs every 4 (four) hours as needed.        . budesonide-formoterol (SYMBICORT) 160-4.5 MCG/ACT inhaler Inhale 2 puffs into the lungs 2 (two) times daily.  1 Inhaler  12  . levothyroxine (SYNTHROID, LEVOTHROID) 50 MCG tablet Take 50 mcg by mouth daily.      . simvastatin (ZOCOR) 5 MG tablet Take 5 mg by mouth at bedtime.        Marland Kitchen tiotropium (SPIRIVA) 18 MCG inhalation capsule Place 18 mcg into inhaler and inhale daily.          Xayden Linsey Pager:  [phone] 09/15/2011, 11:28 AM

## 2011-09-15 NOTE — Patient Instructions (Signed)
Prednisone 10 mg pill>>4 pills for 2 days, 3 pills for 2 days, 2 pills for 2 days, 1 pill for 2 days Doxycycline 100 mg one pill twice per day for 10 days Follow up in 4 weeks

## 2011-09-15 NOTE — Assessment & Plan Note (Signed)
He has acute exacerbation likely related to acute infectious bronchitis.  I don't think his current symptoms are related to recent switch from advair to symbicort.  Will give course of prednisone and doxycycline.  He is to continue spiriva, symbicort, and prn ventolin.  He is to call if his symptoms don't improve.

## 2011-10-14 ENCOUNTER — Ambulatory Visit (INDEPENDENT_AMBULATORY_CARE_PROVIDER_SITE_OTHER): Payer: Medicare Other | Admitting: Pulmonary Disease

## 2011-10-14 ENCOUNTER — Encounter: Payer: Self-pay | Admitting: Pulmonary Disease

## 2011-10-14 VITALS — BP 142/72 | HR 73 | Temp 97.6°F | Ht 70.0 in | Wt 184.8 lb

## 2011-10-14 DIAGNOSIS — J9611 Chronic respiratory failure with hypoxia: Secondary | ICD-10-CM

## 2011-10-14 DIAGNOSIS — J449 Chronic obstructive pulmonary disease, unspecified: Secondary | ICD-10-CM

## 2011-10-14 DIAGNOSIS — J984 Other disorders of lung: Secondary | ICD-10-CM

## 2011-10-14 DIAGNOSIS — R0902 Hypoxemia: Secondary | ICD-10-CM

## 2011-10-14 DIAGNOSIS — J9610 Chronic respiratory failure, unspecified whether with hypoxia or hypercapnia: Secondary | ICD-10-CM

## 2011-10-14 NOTE — Assessment & Plan Note (Signed)
Will arrange for portable pulse oximeter so he can better determine how much oxygen he needs to use during activities.

## 2011-10-14 NOTE — Progress Notes (Signed)
Chief Complaint  Patient presents with  . Follow-up    Pt states his breathing is no worse. has occasional cough. denies any wheezing, chest tx    History of Present Illness:  [name] is a 78 y.o. male former smoker with with Pulmonary nodule, COPD, and exertional/nocturnal hypoxemia.  His breathing is doing better compared to his last visit.  He has finished antibiotics and prednisone.  He is not needing to use ventolin much. He gets tired easily with activity.  He is not using his oxygen much during the day.  He is not having much cough, wheeze, or chest congestion.   Past Medical History  Diagnosis Date  . COPD (chronic obstructive pulmonary disease)   . Hyperlipidemia   . Hypothyroidism   . Pulmonary nodule, right   . Tobacco abuse     Quit 09/17/10    Past Surgical History  Procedure Date  . Tracheostomy     after aspiration event  . Electronavigational bronchoscopy 07/2010    Current Outpatient Prescriptions on File Prior to Visit  Medication Sig Dispense Refill  . albuterol (VENTOLIN HFA) 108 (90 BASE) MCG/ACT inhaler Inhale 2 puffs into the lungs every 4 (four) hours as needed.        . budesonide-formoterol (SYMBICORT) 160-4.5 MCG/ACT inhaler Inhale 2 puffs into the lungs 2 (two) times daily.  1 Inhaler  12  . levothyroxine (SYNTHROID, LEVOTHROID) 50 MCG tablet Take 50 mcg by mouth daily.      . simvastatin (ZOCOR) 5 MG tablet Take 5 mg by mouth at bedtime.        Marland Kitchen tiotropium (SPIRIVA) 18 MCG inhalation capsule Place 18 mcg into inhaler and inhale daily.          Allergies  Allergen Reactions  . Sulfonamide Derivatives     Physical Exam:  Blood pressure 142/72, pulse 73, temperature 97.6 F (36.4 C), temperature source Oral, height 5\' 10"  (1.778 m), weight 184 lb 12.8 oz (83.825 kg), SpO2 98.00%.  General - no distress HEENT - no sinus tenderness, no oral exudate, no LAN Cardiac - s1s2 regular  Chest - prolonged exhalation, no wheeze, no  rales/dullness Abd - soft, nontender Ext - no edema Neuro - normal strength Psych - normal mood, behavior  Assessment/Plan:   Outpatient Encounter Prescriptions as of 10/14/2011  Medication Sig Dispense Refill  . albuterol (VENTOLIN HFA) 108 (90 BASE) MCG/ACT inhaler Inhale 2 puffs into the lungs every 4 (four) hours as needed.        . budesonide-formoterol (SYMBICORT) 160-4.5 MCG/ACT inhaler Inhale 2 puffs into the lungs 2 (two) times daily.  1 Inhaler  12  . levothyroxine (SYNTHROID, LEVOTHROID) 50 MCG tablet Take 50 mcg by mouth daily.      . simvastatin (ZOCOR) 5 MG tablet Take 5 mg by mouth at bedtime.        Marland Kitchen tiotropium (SPIRIVA) 18 MCG inhalation capsule Place 18 mcg into inhaler and inhale daily.        Marland Kitchen DISCONTD: predniSONE (DELTASONE) 10 MG tablet 4 pills for 2 days, 3 pills for 2 days, 2 pills for 2 days, 1 pill for 2 days  20 tablet  0    [name] Pager:  [phone] 10/14/2011, 2:06 PM

## 2011-10-14 NOTE — Patient Instructions (Signed)
Will arrange for portable oxygen meter  Try using oxygen more during the day with activities Follow up in August 2013 after CT chest

## 2011-10-14 NOTE — Assessment & Plan Note (Signed)
Will need follow up CT chest w/o contrast in August 2013.  If stable, then will need to evaluate whether further radiographic follow up is beneficial.

## 2011-10-14 NOTE — Assessment & Plan Note (Signed)
He has recovered from recent exacerbation.  He is to continue spiriva, symbicort and prn ventolin.  If he has frequent exacerbations, may then need to consider adding daliresp.

## 2012-01-31 ENCOUNTER — Ambulatory Visit (INDEPENDENT_AMBULATORY_CARE_PROVIDER_SITE_OTHER)
Admission: RE | Admit: 2012-01-31 | Discharge: 2012-01-31 | Disposition: A | Discharge status: Home / Self Care | Payer: Medicare Other | Source: Ambulatory Visit | Attending: Pulmonary Disease | Admitting: Pulmonary Disease

## 2012-01-31 DIAGNOSIS — J984 Other disorders of lung: Secondary | ICD-10-CM

## 2012-02-03 ENCOUNTER — Ambulatory Visit (INDEPENDENT_AMBULATORY_CARE_PROVIDER_SITE_OTHER): Payer: Medicare Other | Admitting: Pulmonary Disease

## 2012-02-03 ENCOUNTER — Encounter: Payer: Self-pay | Admitting: Pulmonary Disease

## 2012-02-03 VITALS — BP 116/62 | HR 68 | Temp 97.8°F | Ht 70.0 in | Wt 181.2 lb

## 2012-02-03 DIAGNOSIS — J984 Other disorders of lung: Secondary | ICD-10-CM

## 2012-02-03 DIAGNOSIS — J9611 Chronic respiratory failure with hypoxia: Secondary | ICD-10-CM

## 2012-02-03 DIAGNOSIS — J9610 Chronic respiratory failure, unspecified whether with hypoxia or hypercapnia: Secondary | ICD-10-CM

## 2012-02-03 DIAGNOSIS — R0902 Hypoxemia: Secondary | ICD-10-CM

## 2012-02-03 DIAGNOSIS — J449 Chronic obstructive pulmonary disease, unspecified: Secondary | ICD-10-CM

## 2012-02-03 NOTE — Patient Instructions (Signed)
Follow up in 6 months 

## 2012-02-03 NOTE — Assessment & Plan Note (Signed)
Stable on CT chest.  Will schedule f/u CT chest w/o contrast for August 2014.

## 2012-02-03 NOTE — Assessment & Plan Note (Signed)
He is stable on his current inhaler regimen. 

## 2012-02-03 NOTE — Progress Notes (Signed)
Chief Complaint  Patient presents with  . Follow-up    pt breathing is okay as long as he does not over exert himself. denies any cough, wheezing, chest tx    History of Present Illness:  [name] is a 78 y.o. male former smoker with with Pulmonary nodule, COPD, and exertional/nocturnal hypoxemia.  His breathing is doing okay.  He does not need to use albuterol much.  He does okay with light activity, but gets winded with strenuous activity.  He uses his oxygen at night.  He does not have much cough, wheeze, or sputum.   Past Medical History  Diagnosis Date  . COPD (chronic obstructive pulmonary disease)   . Hyperlipidemia   . Hypothyroidism   . Pulmonary nodule, right   . Tobacco abuse     Quit 09/17/10    Past Surgical History  Procedure Date  . Tracheostomy     after aspiration event  . Electronavigational bronchoscopy 07/2010    Current Outpatient Prescriptions on File Prior to Visit  Medication Sig Dispense Refill  . albuterol (VENTOLIN HFA) 108 (90 BASE) MCG/ACT inhaler Inhale 2 puffs into the lungs every 4 (four) hours as needed.        . budesonide-formoterol (SYMBICORT) 160-4.5 MCG/ACT inhaler Inhale 2 puffs into the lungs 2 (two) times daily.  1 Inhaler  12  . levothyroxine (SYNTHROID, LEVOTHROID) 50 MCG tablet Take 50 mcg by mouth daily.      . simvastatin (ZOCOR) 5 MG tablet Take 5 mg by mouth at bedtime.        Marland Kitchen tiotropium (SPIRIVA) 18 MCG inhalation capsule Place 18 mcg into inhaler and inhale daily.          Allergies  Allergen Reactions  . Sulfonamide Derivatives     Physical Exam:  Blood pressure 116/62, pulse 68, temperature 97.8 F (36.6 C), temperature source Oral, height 5\' 10"  (1.778 m), weight 181 lb 3.2 oz (82.192 kg), SpO2 96.00%.  General - no distress HEENT - no sinus tenderness, no oral exudate, no LAN Cardiac - s1s2 regular  Chest - prolonged exhalation, no wheeze, no rales/dullness Abd - soft, nontender Ext - no  edema Neuro - normal strength Psych - normal mood, behavior  Ct Chest Wo Contrast  01/31/2012  *RADIOLOGY REPORT*  Clinical Data: Follow-up pulmonary nodule.  CT CHEST WITHOUT CONTRAST  Technique:  Multidetector CT imaging of the chest was performed following the standard protocol without IV contrast.  Comparison: 07/30/2011 and 08/07/2010.  Findings: A 1.3 cm low attenuation nodule is again seen in the left lobe of the thyroid.  No pathologically enlarged mediastinal or axillary lymph nodes.  Hilar regions are difficult to definitively evaluate without IV contrast.  Atherosclerotic calcification of the arterial vasculature, including coronary arteries.  Heart size normal.  No pericardial effusion.  Biapical pleural parenchymal scarring.  Centrilobular emphysema. Scattered pulmonary nodules measure up to 8 x 5 mm in the left upper lobe, stable from baseline examination of 08/07/2010. Scattered calcified granulomas.  No pleural fluid.  Airway is unremarkable.  Incidental imaging of the upper abdomen shows no acute findings. No worrisome lytic or sclerotic lesions.  Degenerative changes are seen in the spine.  IMPRESSION: Scattered pulmonary nodules are unchanged from baseline examination of 08/07/2010.  Additional follow-up is recommended in 1 year with CT chest without contrast, to ensure 2+ years of documented stability.  Original Report Authenticated By: Reyes Ivan, M.D.   Assessment/Plan:   Outpatient Encounter Prescriptions as of 02/03/2012  Medication Sig Dispense Refill  . albuterol (VENTOLIN HFA) 108 (90 BASE) MCG/ACT inhaler Inhale 2 puffs into the lungs every 4 (four) hours as needed.        . budesonide-formoterol (SYMBICORT) 160-4.5 MCG/ACT inhaler Inhale 2 puffs into the lungs 2 (two) times daily.  1 Inhaler  12  . levothyroxine (SYNTHROID, LEVOTHROID) 50 MCG tablet Take 50 mcg by mouth daily.      . simvastatin (ZOCOR) 5 MG tablet Take 5 mg by mouth at bedtime.        Marland Kitchen tiotropium  (SPIRIVA) 18 MCG inhalation capsule Place 18 mcg into inhaler and inhale daily.          Asal Teas Pager:  [phone] 02/03/2012, 1:43 PM

## 2012-02-03 NOTE — Assessment & Plan Note (Signed)
He is to continue oxygen at night.  Advised he could try using oxygen with activity, and monitor his pulse ox.   

## 2012-06-30 ENCOUNTER — Telehealth: Payer: Self-pay | Admitting: Adult Health

## 2012-06-30 NOTE — Telephone Encounter (Signed)
Error.  Wrong pt///hdp

## 2012-08-01 ENCOUNTER — Ambulatory Visit (INDEPENDENT_AMBULATORY_CARE_PROVIDER_SITE_OTHER): Payer: Medicare Other | Admitting: Pulmonary Disease

## 2012-08-01 ENCOUNTER — Encounter: Payer: Self-pay | Admitting: Pulmonary Disease

## 2012-08-01 VITALS — BP 112/70 | HR 76 | Temp 97.5°F | Ht 70.0 in | Wt 190.6 lb

## 2012-08-01 DIAGNOSIS — R918 Other nonspecific abnormal finding of lung field: Secondary | ICD-10-CM

## 2012-08-01 DIAGNOSIS — R0902 Hypoxemia: Secondary | ICD-10-CM

## 2012-08-01 DIAGNOSIS — J449 Chronic obstructive pulmonary disease, unspecified: Secondary | ICD-10-CM

## 2012-08-01 DIAGNOSIS — J9611 Chronic respiratory failure with hypoxia: Secondary | ICD-10-CM

## 2012-08-01 DIAGNOSIS — J9610 Chronic respiratory failure, unspecified whether with hypoxia or hypercapnia: Secondary | ICD-10-CM

## 2012-08-01 NOTE — Assessment & Plan Note (Signed)
Stable on his current inhaler regimen. 

## 2012-08-01 NOTE — Progress Notes (Signed)
Chief Complaint  Patient presents with  . Follow-up    Pt states his breathing has been okay. Has occasional cough. no wheezing, no chest tx. Per spouse he mouth breathes.  CAT score 15    History of Present Illness: [name] is a 79 y.o. male former smoker with with Pulmonary nodule, COPD, and exertional/nocturnal hypoxemia.  He has been doing well.  He does okay with activity as long as he paces himself.  He is not having much cough.  He denies sputum, fever, wheeze, chest pain, hemoptysis, or leg swelling.  He monitor his oxygen with pulse ox during the day.  He uses his oxygen at night.  TESTS: PFT 07/14/06 >> FEV1 1.41(51%), FEV1% 60, TLC 7.3(123%), DLCO 75%, no BD  PFT 07/29/10 >> FEV1 1.30(50%), FEV1% 50, TLC 6.39(100%), DLCO 68%, no BD Spirometry 11/17/10 >> FEV1 1.15(36%), FEV1% 56 Hoarse from advair 04/30/11 >> SpO2 with exertion on room air 85%  Past Medical History  Diagnosis Date  . COPD (chronic obstructive pulmonary disease)   . Hyperlipidemia   . Hypothyroidism   . Pulmonary nodule, right   . Tobacco abuse     Quit 09/17/10    Past Surgical History  Procedure Laterality Date  . Tracheostomy      after aspiration event  . Electronavigational bronchoscopy  07/2010    Outpatient Encounter Prescriptions as of 08/01/2012  Medication Sig Dispense Refill  . albuterol (VENTOLIN HFA) 108 (90 BASE) MCG/ACT inhaler Inhale 2 puffs into the lungs every 4 (four) hours as needed.        . budesonide-formoterol (SYMBICORT) 160-4.5 MCG/ACT inhaler Inhale 2 puffs into the lungs 2 (two) times daily.  1 Inhaler  12  . levothyroxine (SYNTHROID, LEVOTHROID) 50 MCG tablet Take 50 mcg by mouth daily.      . simvastatin (ZOCOR) 5 MG tablet Take 5 mg by mouth at bedtime.        Marland Kitchen tiotropium (SPIRIVA) 18 MCG inhalation capsule Place 18 mcg into inhaler and inhale daily.         No facility-administered encounter medications on file as of 08/01/2012.    Allergies  Allergen  Reactions  . Sulfonamide Derivatives     Physical Exam:  Filed Vitals:   08/01/12 1404 08/01/12 1406  BP:  112/70  Pulse:  76  Temp: 97.5 F (36.4 C)   TempSrc: Oral   Height: 5\' 10"  (1.778 m)   Weight: 190 lb 9.6 oz (86.456 kg)   SpO2:  100%     Current Encounter SPO2  08/01/12 1406 100%  02/03/12 1339 96%  10/14/11 1401 98%     Body mass index is 27.35 kg/(m^2).   Wt Readings from Last 2 Encounters:  08/01/12 190 lb 9.6 oz (86.456 kg)  02/03/12 181 lb 3.2 oz (82.192 kg)     General - No distress ENT - No sinus tenderness, no oral exudate, no LAN Cardiac - s1s2 regular, no murmur Chest - No wheeze/rales/dullness Back - No focal tenderness Abd - Soft, non-tender Ext - No edema Neuro - Normal strength Skin - No rashes Psych - normal mood, and behavior   Assessment/Plan:  Coralyn Helling, MD [facility] Pager:  [phone] 08/01/2012, 2:22 PM

## 2012-08-01 NOTE — Assessment & Plan Note (Signed)
Will schedule f/u CT chest w/o contrast for August 2014.

## 2012-08-01 NOTE — Patient Instructions (Signed)
Follow up in August 2014 after CT chest is done

## 2012-08-01 NOTE — Assessment & Plan Note (Signed)
He is to continue oxygen at night.  Advised he could try using oxygen with activity, and monitor his pulse ox.

## 2012-08-21 ENCOUNTER — Other Ambulatory Visit: Payer: Self-pay | Admitting: Pulmonary Disease

## 2012-08-22 ENCOUNTER — Other Ambulatory Visit: Payer: Self-pay | Admitting: Pulmonary Disease

## 2012-09-11 ENCOUNTER — Encounter: Payer: Self-pay | Admitting: Pulmonary Disease

## 2012-09-11 ENCOUNTER — Ambulatory Visit (INDEPENDENT_AMBULATORY_CARE_PROVIDER_SITE_OTHER): Payer: Medicare Other | Admitting: Pulmonary Disease

## 2012-09-11 VITALS — BP 130/80 | HR 71 | Temp 97.5°F | Ht 71.0 in | Wt 186.0 lb

## 2012-09-11 DIAGNOSIS — J4489 Other specified chronic obstructive pulmonary disease: Secondary | ICD-10-CM

## 2012-09-11 DIAGNOSIS — J209 Acute bronchitis, unspecified: Secondary | ICD-10-CM | POA: Insufficient documentation

## 2012-09-11 DIAGNOSIS — J449 Chronic obstructive pulmonary disease, unspecified: Secondary | ICD-10-CM

## 2012-09-11 MED ORDER — LEVOFLOXACIN 750 MG PO TABS: 750.0000 mg | ORAL_TABLET | Freq: Every day | ORAL | Status: DC

## 2012-09-11 MED ORDER — PREDNISONE 10 MG PO TABS: ORAL_TABLET | ORAL | Status: DC

## 2012-09-11 NOTE — Assessment & Plan Note (Signed)
The pts hx is most c/w acute bronchitis.  Given his poor pulmonary reserve, he will need to be treated aggressively with abx.

## 2012-09-11 NOTE — Assessment & Plan Note (Signed)
The patient has some increase in his shortness of breath, but does not feel that it is overtly significant currently.  He has no acute bronchospasm on exam.  I will therefore give him a prescription for prednisone to hold, but he is not to take it unless he has worsening in his breathing despite being on the antibiotics.

## 2012-09-11 NOTE — Patient Instructions (Addendum)
Will treat with levaquin 750mg  one a day for 5 days Will give you a prescription for prednisone to hold, but start taking if you feel you are not returning to baseline.  Keep followup with Dr. Craige Cotta, but call if you are not getting better.

## 2012-09-11 NOTE — Addendum Note (Signed)
Addended by: Nita Sells on: 09/11/2012 03:34 PM   Modules accepted: Orders

## 2012-09-11 NOTE — Progress Notes (Signed)
  Subjective:    Patient ID: [name], male    DOB: 05-13-1928, 79 y.o.   MRN: [id number]  HPI Patient comes in today for an acute sick visit.  He has known moderate to severe emphysema, and gives a three-day history of worsening chest congestion as well as a cough with purulent mucus.  He has not had any fevers, chills, or sweats.  His breathing is a little worse than his baseline, but it is not significantly worse.   Review of Systems  Constitutional: Negative for fever and unexpected weight change.  HENT: Negative for ear pain, nosebleeds, congestion, sore throat, rhinorrhea, sneezing, trouble swallowing, dental problem, postnasal drip and sinus pressure.   Eyes: Negative for redness and itching.  Respiratory: Positive for cough, shortness of breath and wheezing. Negative for chest tightness.   Cardiovascular: Negative for palpitations and leg swelling.  Gastrointestinal: Negative for nausea and vomiting.  Genitourinary: Negative for dysuria.  Musculoskeletal: Negative for joint swelling.  Skin: Negative for rash.  Neurological: Positive for headaches.  Hematological: Does not bruise/bleed easily.  Psychiatric/Behavioral: Negative for dysphoric mood. The patient is not nervous/anxious.        Objective:   Physical Exam Well-developed male in no acute distress Nose without purulence or discharge noted Oropharynx clear Neck without lymphadenopathy or thyromegaly Chest with a few rhonchi, but adequate air flow, no wheezing Cardiac exam is regular rate and rhythm Lower extremities without edema, no cyanosis Alert and oriented, moves all 4 extremities.       Assessment & Plan:

## 2012-09-28 ENCOUNTER — Telehealth: Payer: Self-pay | Admitting: Pulmonary Disease

## 2012-09-28 NOTE — Telephone Encounter (Signed)
I spoke with lauren. She stated pt is due for recert and needs to be with OV. She scheduled this for pt. Nothing further was needed

## 2012-10-31 ENCOUNTER — Ambulatory Visit (INDEPENDENT_AMBULATORY_CARE_PROVIDER_SITE_OTHER): Payer: Medicare Other | Admitting: Pulmonary Disease

## 2012-10-31 ENCOUNTER — Encounter: Payer: Self-pay | Admitting: Pulmonary Disease

## 2012-10-31 VITALS — BP 138/58 | HR 66 | Temp 98.2°F | Ht 70.0 in | Wt 184.0 lb

## 2012-10-31 DIAGNOSIS — J9611 Chronic respiratory failure with hypoxia: Secondary | ICD-10-CM

## 2012-10-31 DIAGNOSIS — J449 Chronic obstructive pulmonary disease, unspecified: Secondary | ICD-10-CM

## 2012-10-31 DIAGNOSIS — R0902 Hypoxemia: Secondary | ICD-10-CM

## 2012-10-31 DIAGNOSIS — J9610 Chronic respiratory failure, unspecified whether with hypoxia or hypercapnia: Secondary | ICD-10-CM

## 2012-10-31 DIAGNOSIS — R918 Other nonspecific abnormal finding of lung field: Secondary | ICD-10-CM

## 2012-10-31 NOTE — Progress Notes (Signed)
Chief Complaint  Patient presents with  . Follow-up    Pt needs qualifying sats. pt states breathing is okay as long as he paces himself. Has occasional cough. Denies any wheezing, no chest tx    History of Present Illness: [name] is a 79 y.o. male former smoker with with Pulmonary nodule, COPD, and exertional/nocturnal hypoxemia.  He is here to re-assess home oxygen needs.   He was treated for an exacerbation in March.  He has been doing better.  He keeps up with his activity at an even pace.  He is not having much cough, wheeze, or sputum.  He denies leg swelling.  He uses spiriva daily, and symbicort twice daily.  He uses ventolin 4 to 6 times per week, and this helps.  TESTS: PFT 07/14/06 >> FEV1 1.41(51%), FEV1% 60, TLC 7.3(123%), DLCO 75%, no BD  PFT 07/29/10 >> FEV1 1.30(50%), FEV1% 50, TLC 6.39(100%), DLCO 68%, no BD Spirometry 11/17/10 >> FEV1 1.15(36%), FEV1% 56 Hoarse from advair 04/30/11 >> SpO2 with exertion on room air 85%  He  has a past medical history of COPD (chronic obstructive pulmonary disease); Hyperlipidemia; Hypothyroidism; Pulmonary nodule, right; and Tobacco abuse.  He  has past surgical history that includes Tracheostomy and electronavigational bronchoscopy (07/2010).   Outpatient Encounter Prescriptions as of 10/31/2012  Medication Sig Dispense Refill  . albuterol (VENTOLIN HFA) 108 (90 BASE) MCG/ACT inhaler Inhale 2 puffs into the lungs every 4 (four) hours as needed.        Marland Kitchen levothyroxine (SYNTHROID, LEVOTHROID) 50 MCG tablet Take 50 mcg by mouth daily.      . simvastatin (ZOCOR) 5 MG tablet Take 5 mg by mouth at bedtime.        . SYMBICORT 160-4.5 MCG/ACT inhaler INHALE 2 PUFFS INTO THE LUNGS 2 (TWO) TIMES DAILY.  1 Inhaler  5  . tiotropium (SPIRIVA) 18 MCG inhalation capsule Place 18 mcg into inhaler and inhale daily.        . [DISCONTINUED] levofloxacin (LEVAQUIN) 750 MG tablet Take 1 tablet (750 mg total) by mouth daily.  5 tablet  0  .  [DISCONTINUED] predniSONE (DELTASONE) 10 MG tablet Take 4 tabs po x 2 days, then 3 x 2 days, then 2 x 2 days, then 1 x 2 days then stop.  20 tablet  0   No facility-administered encounter medications on file as of 10/31/2012.    Allergies  Allergen Reactions  . Sulfonamide Derivatives     Physical Exam:  General - No distress ENT - No sinus tenderness, no oral exudate, no LAN Cardiac - s1s2 regular, no murmur Chest - No wheeze/rales/dullness Back - No focal tenderness Abd - Soft, non-tender Ext - No edema Neuro - Normal strength Skin - No rashes Psych - normal mood, and behavior  Ambulatory Oximetry on Room Air   Pulse  SpO2 Rest   64  99% Lap 1  126  97% Lap 2  127  96% Lap 3  132  96%   Assessment/Plan:  Coralyn Helling, MD [place] [facility]/Sleep Pager:  [phone] 10/31/2012, 10:15 AM

## 2012-10-31 NOTE — Patient Instructions (Signed)
Will arrange for overnight oxygen test and call with results Follow up in August 2014 after CT chest

## 2012-10-31 NOTE — Assessment & Plan Note (Signed)
He has f/u CT chest w/o contrast scheduled for August 2014.

## 2012-10-31 NOTE — Assessment & Plan Note (Signed)
Stable on current regimen of spiriva, symbicort, and prn ventolin.

## 2012-10-31 NOTE — Assessment & Plan Note (Signed)
He maintained his SpO2 on room air with exertion.  Will arrange for room air ONO, and then determine if he needs to continue supplemental oxygen.

## 2012-11-22 ENCOUNTER — Telehealth: Payer: Self-pay | Admitting: Pulmonary Disease

## 2012-11-22 NOTE — Telephone Encounter (Signed)
Order refaxed with overnight oximetry written on the order [name]

## 2012-11-30 ENCOUNTER — Telehealth: Payer: Self-pay | Admitting: Pulmonary Disease

## 2012-11-30 DIAGNOSIS — J9611 Chronic respiratory failure with hypoxia: Secondary | ICD-10-CM

## 2012-11-30 NOTE — Telephone Encounter (Signed)
Dr. Craige Cotta have you received ONO results on PT. Please advise thanks

## 2012-12-13 NOTE — Telephone Encounter (Signed)
Called and spoke with Angie at [facility]. I do not have the ONO report. I have requested that Angie re-fax this to [place]. Will await for the fax to come over.

## 2012-12-13 NOTE — Telephone Encounter (Signed)
ONO with RA 11/30/12 >> Test time 9 hrs 30 min.  Mean SpO2 92.8%, low SpO2 86%.  Spent 3 min with SpO2 < 88%   Will have my nurse inform pt that overnight oxygen test looks good.  He no longer needs to use oxygen at night.  Please send order to his DME to have home oxygen set up discontinued and removed.

## 2012-12-13 NOTE — Telephone Encounter (Signed)
Spoke with Angie at [facility]-they have not received DC order from our office based on ONO results on patient. I do not see any results scanned in EPIC. Will forward to [place] to see if she may have the results for patient. Thanks.

## 2012-12-13 NOTE — Telephone Encounter (Signed)
Spoke with pt's spouse and notified of recs per VS She verbalized understanding and states nothing further needed Order was sent to [facility] to d/c o2

## 2013-01-10 ENCOUNTER — Encounter: Payer: Self-pay | Admitting: Pulmonary Disease

## 2013-01-23 ENCOUNTER — Ambulatory Visit (INDEPENDENT_AMBULATORY_CARE_PROVIDER_SITE_OTHER)
Admission: RE | Admit: 2013-01-23 | Discharge: 2013-01-23 | Disposition: A | Discharge status: Home / Self Care | Payer: Medicare Other | Source: Ambulatory Visit | Attending: Pulmonary Disease | Admitting: Pulmonary Disease

## 2013-01-23 DIAGNOSIS — J9610 Chronic respiratory failure, unspecified whether with hypoxia or hypercapnia: Secondary | ICD-10-CM

## 2013-01-23 DIAGNOSIS — R0902 Hypoxemia: Secondary | ICD-10-CM

## 2013-01-23 DIAGNOSIS — J9611 Chronic respiratory failure with hypoxia: Secondary | ICD-10-CM

## 2013-01-23 DIAGNOSIS — R918 Other nonspecific abnormal finding of lung field: Secondary | ICD-10-CM

## 2013-01-24 ENCOUNTER — Telehealth: Payer: Self-pay | Admitting: Pulmonary Disease

## 2013-01-24 NOTE — Telephone Encounter (Signed)
Ct Chest Wo Contrast  01/23/2013   *RADIOLOGY REPORT*   Clinical Data: Follow up pulmonary nodules  CT CHEST WITHOUT CONTRAST   Technique:  Multidetector CT imaging of the chest was performed following the standard protocol without IV contrast.   Comparison: Multiple prior CTs, including 01/21/2012 and 08/07/2010   Findings:  Scattered pulmonary nodules, including: --3 mm left upper lobe nodule (series 3/image 10), unchanged since 2012 --8 mm anterior left upper lobe nodule (series 3/image 17), minimally decreased from 04/28/2011 (not present prior to that date) --7 mm medial right lower lobe nodule (series 3/image 33), unchanged since 2012 --4 mm nodule along the right major fissure (series 3/image 35), unchanged since 2012  Mild centrilobular emphysematous changes.  No pleural effusion or pneumothorax.  Stable left thyroid nodule.  The heart is normal in size.  No pericardial effusion.  Coronary atherosclerosis.  Atherosclerotic calcifications of the aortic arch.  Small mediastinal lymph nodes measuring up to 6 mm short axis (series 2/image 29), which does not meet pathologic CT size criteria.  No suspicious hilar or axillary lymphadenopathy.  Visualized upper abdomen is unremarkable.  Mild degenerative changes of the visualized thoracolumbar spine.   IMPRESSION:  Scattered bilateral pulmonary nodules, measuring up to 7-8 mm, as described above.  At least 18-24 month stability has been demonstrated for all nodules.   No dedicated follow-up imaging is required per Fleischner Society guidelines.  This recommendation follows the consensus statement: Guidelines for Management of Small Pulmonary Nodules Detected on CT Scans: A Statement from the Fleischner Society as published in Radiology 2005; 237:395-400.    Original Report Authenticated By: Charline Bills, M.D.    Will have my nurse inform pt that CT chest results are stable w/o progression of pulmonary nodules, and schedule next available ROV to  discuss in more detail and review status of COPD.

## 2013-01-24 NOTE — Telephone Encounter (Signed)
Pt is aware of results. He has been scheduled for 03/12/2013 at 1:30pm.

## 2013-01-24 NOTE — Telephone Encounter (Signed)
PT is returning Linday's call & can be reached at [phone].  Antionette Fairy

## 2013-01-24 NOTE — Telephone Encounter (Signed)
lmtcb

## 2013-02-18 ENCOUNTER — Other Ambulatory Visit: Payer: Self-pay | Admitting: Pulmonary Disease

## 2013-03-12 ENCOUNTER — Ambulatory Visit: Payer: Medicare Other | Admitting: Pulmonary Disease

## 2013-03-12 ENCOUNTER — Ambulatory Visit (INDEPENDENT_AMBULATORY_CARE_PROVIDER_SITE_OTHER): Payer: Medicare Other | Admitting: Pulmonary Disease

## 2013-03-12 ENCOUNTER — Encounter: Payer: Self-pay | Admitting: Pulmonary Disease

## 2013-03-12 VITALS — BP 124/62 | HR 68 | Temp 97.5°F | Ht 70.0 in | Wt 182.8 lb

## 2013-03-12 DIAGNOSIS — Z23 Encounter for immunization: Secondary | ICD-10-CM

## 2013-03-12 DIAGNOSIS — J449 Chronic obstructive pulmonary disease, unspecified: Secondary | ICD-10-CM

## 2013-03-12 DIAGNOSIS — R918 Other nonspecific abnormal finding of lung field: Secondary | ICD-10-CM

## 2013-03-12 DIAGNOSIS — J4489 Other specified chronic obstructive pulmonary disease: Secondary | ICD-10-CM

## 2013-03-12 MED ORDER — UMECLIDINIUM-VILANTEROL 62.5-25 MCG/INH IN AEPB: 1.0000 | INHALATION_SPRAY | Freq: Every day | RESPIRATORY_TRACT | Status: DC

## 2013-03-12 NOTE — Addendum Note (Signed)
Addended by: Tommie Sams on: 03/12/2013 02:01 PM   Modules accepted: Orders

## 2013-03-12 NOTE — Assessment & Plan Note (Signed)
Stable over more than 2 years.  Likely benign lesions.  No additional radiographic follow up needed at this time.

## 2013-03-12 NOTE — Assessment & Plan Note (Signed)
Will try changing him from symbicort/spiriva to anoro.  Have given two weeks of samples for this >> he is to call back in two weeks to inform if he tolerates change, and will then send refill for anoro.

## 2013-03-12 NOTE — Progress Notes (Signed)
Chief Complaint  Patient presents with  . Follow-up    Pt reports his breathing is okay as long as he does not do any activity. Occasional cough every once in a while. Denies any wheezing, chest tx    History of Present Illness: [name] is a 79 y.o. male former smoker with with Pulmonary nodule, and COPD.  He had CT chest in August.  He is not having cough, wheeze or sputum.  He exercised at the gym 3 times per week.  He does okay with most activities, but gets winded/tired if he does too much yard work.    He denies chest pain, fever, or leg swelling.   TESTS: PFT 07/14/06 >> FEV1 1.41(51%), FEV1% 60, TLC 7.3(123%), DLCO 75%, no BD  PFT 07/29/10 >> FEV1 1.30(50%), FEV1% 50, TLC 6.39(100%), DLCO 68%, no BD Spirometry 11/17/10 >> FEV1 1.15(36%), FEV1% 56 ONO with RA 11/30/12 >> Test time 9 hrs 30 min.  Mean SpO2 92.8%, low SpO2 86%.  Spent 3 min with SpO2 < 88%   He  has a past medical history of COPD (chronic obstructive pulmonary disease); Hyperlipidemia; Hypothyroidism; Pulmonary nodule, right; and Tobacco abuse.  He  has past surgical history that includes Tracheostomy and electronavigational bronchoscopy (07/2010).   Outpatient Encounter Prescriptions as of 03/12/2013  Medication Sig Dispense Refill  . albuterol (VENTOLIN HFA) 108 (90 BASE) MCG/ACT inhaler Inhale 2 puffs into the lungs every 4 (four) hours as needed.        Marland Kitchen levothyroxine (SYNTHROID, LEVOTHROID) 50 MCG tablet Take 50 mcg by mouth daily.      . simvastatin (ZOCOR) 5 MG tablet Take 5 mg by mouth at bedtime.        . SYMBICORT 160-4.5 MCG/ACT inhaler INHALE 2 PUFFS INTO THE LUNGS 2 (TWO) TIMES DAILY.  1 Inhaler  5  . tiotropium (SPIRIVA) 18 MCG inhalation capsule Place 18 mcg into inhaler and inhale daily.        . [DISCONTINUED] SYMBICORT 160-4.5 MCG/ACT inhaler INHALE 2 PUFFS INTO THE LUNGS 2 (TWO) TIMES DAILY.  10.2 g  5   No facility-administered encounter medications on file as of 03/12/2013.     Allergies  Allergen Reactions  . Sulfonamide Derivatives     Physical Exam:  General - No distress ENT - No sinus tenderness, no oral exudate, no LAN Cardiac - s1s2 regular, no murmur Chest - No wheeze/rales/dullness Back - No focal tenderness Abd - Soft, non-tender Ext - No edema Neuro - Normal strength Skin - No rashes Psych - normal mood, and behavior   01/23/2013   *RADIOLOGY REPORT*   Clinical Data: Follow up pulmonary nodules  CT CHEST WITHOUT CONTRAST   Technique:  Multidetector CT imaging of the chest was performed following the standard protocol without IV contrast.   Comparison: Multiple prior CTs, including 01/21/2012 and 08/07/2010   Findings: Scattered pulmonary nodules, including: --3 mm left upper lobe nodule (series 3/image 10), unchanged since 2012 --8 mm anterior left upper lobe nodule (series 3/image 17), minimally decreased from 04/28/2011 (not present prior to that date) --7 mm medial right lower lobe nodule (series 3/image 33), unchanged since 2012 --4 mm nodule along the right major fissure (series 3/image 35), unchanged since 2012  Mild centrilobular emphysematous changes.  No pleural effusion or pneumothorax.  Stable left thyroid nodule.  The heart is normal in size.  No pericardial effusion.  Coronary atherosclerosis.  Atherosclerotic calcifications of the aortic arch.  Small mediastinal lymph nodes measuring up  to 6 mm short axis (series 2/image 29), which does not meet pathologic CT size criteria.  No suspicious hilar or axillary lymphadenopathy.  Visualized upper abdomen is unremarkable.  Mild degenerative changes of the visualized thoracolumbar spine.   IMPRESSION:  Scattered bilateral pulmonary nodules, measuring up to 7-8 mm, as described above.  At least 18-24 month stability has been demonstrated for all nodules.   No dedicated follow-up imaging is required per Fleischner Society guidelines.  This recommendation follows the consensus statement:  Guidelines for Management of Small Pulmonary Nodules Detected on CT Scans: A Statement from the Fleischner Society as published in Radiology 2005; 237:395-400.     Assessment/Plan:  Coralyn Helling, MD [place] [facility]/Critical Care/Sleep Pager:  [phone] 03/12/2013, 1:41 PM

## 2013-03-12 NOTE — Patient Instructions (Signed)
Try anoro one puff daily >> call in two weeks to update status, and will then decide about sending refills Do not use spiriva or symbicort while using anoro Flu shot today Follow up in 6 months

## 2013-03-26 ENCOUNTER — Telehealth: Payer: Self-pay | Admitting: Pulmonary Disease

## 2013-03-26 MED ORDER — UMECLIDINIUM-VILANTEROL 62.5-25 MCG/INH IN AEPB: 1.0000 | INHALATION_SPRAY | Freq: Every day | RESPIRATORY_TRACT | Status: DC

## 2013-03-26 NOTE — Telephone Encounter (Signed)
I have sent RX into pt pharmacy. I called and made spouse aware. Nothing further needed

## 2013-03-26 NOTE — Telephone Encounter (Signed)
Please send refill for anoro one puff daily, dispense 30 day supply with 5 refills.

## 2013-03-26 NOTE — Telephone Encounter (Signed)
I spoke with pt. He stated the anoro has been working well for him. He stated he feels this has helped with his SOB but he would like to continue on this medication to see more of what it will do for him. He used the last puff today. Please advise Dr. Craige Cotta thanks

## 2013-06-22 ENCOUNTER — Telehealth: Payer: Self-pay | Admitting: Pulmonary Disease

## 2013-06-22 MED ORDER — BUDESONIDE-FORMOTEROL FUMARATE 160-4.5 MCG/ACT IN AERO: 2.0000 | INHALATION_SPRAY | Freq: Two times a day (BID) | RESPIRATORY_TRACT | Status: DC

## 2013-06-22 MED ORDER — TIOTROPIUM BROMIDE MONOHYDRATE 18 MCG IN CAPS: 18.0000 ug | ORAL_CAPSULE | Freq: Every day | RESPIRATORY_TRACT | Status: DC

## 2013-06-22 NOTE — Telephone Encounter (Signed)
Okay to discontinue anora, and resume spiriva one puff daily, and symbicort 160/4.5 two puffs bid.  Dispense one month supply for each inhaler with 5 refills.

## 2013-06-22 NOTE — Telephone Encounter (Signed)
Spoke with the pt and notified of recs per VS  Pt verbalized understanding  Rxs were sent to pharm

## 2013-06-22 NOTE — Telephone Encounter (Signed)
Spoke with the pt  He is c/o increased SOB for the past 4-6 wks  He is needing to use rescue inhaler approx 4-6 times per wk  He states that he feels like he did better on spiriva and symbicort than with anoro and wants to switch back to these meds Pt aware VS out of the office again until 06/25/12 and is okay with waiting until then for answer Please advise thanks

## 2013-10-22 ENCOUNTER — Ambulatory Visit: Payer: Medicare Other | Admitting: Pulmonary Disease

## 2013-10-26 ENCOUNTER — Ambulatory Visit (INDEPENDENT_AMBULATORY_CARE_PROVIDER_SITE_OTHER): Payer: Medicare Other | Admitting: Pulmonary Disease

## 2013-10-26 ENCOUNTER — Encounter: Payer: Self-pay | Admitting: Pulmonary Disease

## 2013-10-26 ENCOUNTER — Ambulatory Visit (INDEPENDENT_AMBULATORY_CARE_PROVIDER_SITE_OTHER)
Admission: RE | Admit: 2013-10-26 | Discharge: 2013-10-26 | Disposition: A | Discharge status: Home / Self Care | Payer: Medicare Other | Source: Ambulatory Visit | Attending: Pulmonary Disease | Admitting: Pulmonary Disease

## 2013-10-26 VITALS — BP 116/70 | HR 64 | Ht 69.0 in | Wt 188.0 lb

## 2013-10-26 DIAGNOSIS — J449 Chronic obstructive pulmonary disease, unspecified: Secondary | ICD-10-CM

## 2013-10-26 DIAGNOSIS — J9690 Respiratory failure, unspecified, unspecified whether with hypoxia or hypercapnia: Secondary | ICD-10-CM

## 2013-10-26 DIAGNOSIS — Z23 Encounter for immunization: Secondary | ICD-10-CM

## 2013-10-26 DIAGNOSIS — J9600 Acute respiratory failure, unspecified whether with hypoxia or hypercapnia: Secondary | ICD-10-CM

## 2013-10-26 DIAGNOSIS — J4489 Other specified chronic obstructive pulmonary disease: Secondary | ICD-10-CM

## 2013-10-26 MED ORDER — PREDNISONE 5 MG PO TABS
5.0000 mg | ORAL_TABLET | Freq: Every day | ORAL | Status: DC
Start: 2013-10-26 — End: 2014-01-15

## 2013-10-26 NOTE — Assessment & Plan Note (Addendum)
He has progressive dyspnea.  His spirometry shows expected progressive decline in lung function.  Will have him continue spiriva, symbicort, and prn albuterol.  Will have him try low dose prednisone.  Will get chest xray today and call him with results.    He has oxygen desaturation with exertion on room air today.  Will restart 4 liters supplemental oxygen with exertion.  Will repeat overnight oximetry on room air, and then decide if he also needs to use oxygen at night.  He may also need Echo at some point to assess LV/RV function and pulmonary artery pressure.

## 2013-10-26 NOTE — Patient Instructions (Signed)
Prednisone 5 mg daily Chest xray today Prevnar shot today Will arrange for home oxygen set up Will get overnight oxygen test Follow up in 2 to 3 weeks with Dr. Halford Chessman or [facility]

## 2013-10-26 NOTE — Progress Notes (Signed)
Chief Complaint  Patient presents with  . COPD    Breathing is worse since last OV. Reports DOE. Denies chest tightness, coughing or wheezing.    History of Present Illness: [name] is a 80 y.o. male former smoker with [name] COPD/emphysema.  He continues to have shortness of breath with exertion.  This has impacted his activity level.  He tried switching back from anoro to spiriva and symbicort.  This did not seem to help.  He is not having cough, wheeze, sputum, chest pain, or leg swelling.  He does feel his heart rate go fast when he does activity, but this gets better when he rests.  TESTS: PFT 07/14/06 >> FEV1 1.41(51%), FEV1% 60, TLC 7.3(123%), DLCO 75%, no BD  PFT 07/29/10 >> FEV1 1.30(50%), FEV1% 50, TLC 6.39(100%), DLCO 68%, no BD Spirometry 11/17/10 >> FEV1 1.15(36%), FEV1% 56 ONO with RA 11/30/12 >> Test time 9 hrs 30 min.  Mean SpO2 92.8%, low SpO2 86%.  Spent 3 min with SpO2 < 88% Spirometry 10/26/13 >> FEV1 0.92 (30%), FEV1% 39  He  has a past medical history of COPD (chronic obstructive pulmonary disease); Hyperlipidemia; Hypothyroidism; Pulmonary nodule, right; and Tobacco abuse.  He  has past surgical history that includes Tracheostomy and electronavigational bronchoscopy (07/2010).   Outpatient Encounter Prescriptions as of 10/26/2013  Medication Sig  . albuterol (VENTOLIN HFA) 108 (90 BASE) MCG/ACT inhaler Inhale 2 puffs into the lungs every 4 (four) hours as needed.    . budesonide-formoterol (SYMBICORT) 160-4.5 MCG/ACT inhaler Inhale 2 puffs into the lungs 2 (two) times daily.  Marland Kitchen levothyroxine (SYNTHROID, LEVOTHROID) 50 MCG tablet Take 50 mcg by mouth daily.  . simvastatin (ZOCOR) 5 MG tablet Take 5 mg by mouth at bedtime.    Marland Kitchen tiotropium (SPIRIVA HANDIHALER) 18 MCG inhalation capsule Place 1 capsule (18 mcg total) into inhaler and inhale daily.    Allergies  Allergen Reactions  . Sulfonamide Derivatives     Physical Exam:  General - No distress ENT -  No sinus tenderness, no oral exudate, no LAN Cardiac - s1s2 regular, no murmur Chest - decreased breath sounds, no wheeze/rales Back - No focal tenderness Abd - Soft, non-tender Ext - No edema Neuro - Normal strength Skin - No rashes Psych - normal mood, and behavior  SpO2 on room air with exertion >> 86% after one lap  Assessment/Plan:  Sean Mires, MD [place] [facility]/Critical Care/Sleep Pager:  [phone] 10/26/2013, 12:17 PM

## 2013-10-27 DIAGNOSIS — J9611 Chronic respiratory failure with hypoxia: Secondary | ICD-10-CM | POA: Insufficient documentation

## 2013-10-27 NOTE — Assessment & Plan Note (Signed)
He has oxygen desaturation with exertion.  This improved with supplemental oxygen of 4 liters.  This likely explains his exercise limitation and is likely related to progression of COPD/emphysema.

## 2013-10-29 ENCOUNTER — Telehealth: Payer: Self-pay | Admitting: Pulmonary Disease

## 2013-10-29 NOTE — Telephone Encounter (Signed)
Pt is aware of results. 

## 2013-10-29 NOTE — Telephone Encounter (Signed)
Dg Chest 2 View  10/26/2013   CLINICAL DATA:  COPD, smoker  EXAM: CHEST  2 VIEW  COMPARISON:  09/21/2010 and CT scan 01/23/2013  FINDINGS: Cardiomediastinal silhouette is stable. Hyperinflation again noted. No acute infiltrate or pleural effusion. No pulmonary edema. Subcentimeter nodules noted on previous CT scan are not identified on chest x-ray.  IMPRESSION: No active cardiopulmonary disease.   Electronically Signed   By: Lahoma Crocker M.D.   On: 10/26/2013 16:14   Will have my nurse inform pt that CXR showed changes from COPD, but no other new/worrisome findings.  No change to current treatment plan.

## 2013-11-16 ENCOUNTER — Encounter: Payer: Self-pay | Admitting: Adult Health

## 2013-11-16 ENCOUNTER — Ambulatory Visit (INDEPENDENT_AMBULATORY_CARE_PROVIDER_SITE_OTHER): Payer: Medicare Other | Admitting: Adult Health

## 2013-11-16 VITALS — BP 124/76 | HR 97 | Temp 98.0°F | Ht 70.0 in | Wt 190.0 lb

## 2013-11-16 DIAGNOSIS — J439 Emphysema, unspecified: Secondary | ICD-10-CM

## 2013-11-16 DIAGNOSIS — J438 Other emphysema: Secondary | ICD-10-CM

## 2013-11-16 NOTE — Patient Instructions (Signed)
Finish symbicort and spiriva samples as discussed then back to [facility] 1 puff daily  Oxygen 2l/m activity  Begin Oxygen 2l/m At bedtime  .  Activitiy as tolerated  Continue on Prednisone 5mg  daily  Follow up Dr. Halford Chessman  In 6-8 weeks and As needed

## 2013-11-19 NOTE — Assessment & Plan Note (Addendum)
Severe COPD now with chronic hypoxia with activity and sleep  For now remain on low dose steroids , on return evaluate if benefit if not will taper to off.   Plan  Finish symbicort and spiriva samples as discussed then back to [facility] 1 puff daily  Oxygen 2l/m activity  Begin Oxygen 2l/m At bedtime  .  Activitiy as tolerated  Continue on Prednisone 5mg  daily  Follow up Dr. Halford Chessman  In 6-8 weeks and As needed

## 2013-11-19 NOTE — Progress Notes (Signed)
   Subjective:    Patient ID: [name], male    DOB: [birth year], 80 y.o.   MRN: [id number]  HPI 80 yo male former smoker with severe COPD/emphysema.   TESTS: PFT 07/14/06 >> FEV1 1.41(51%), FEV1% 60, TLC 7.3(123%), DLCO 75%, no BD  PFT 07/29/10 >> FEV1 1.30(50%), FEV1% 50, TLC 6.39(100%), DLCO 68%, no BD Spirometry 11/17/10 >> FEV1 1.15(36%), FEV1% 56 ONO with RA 11/30/12 >> Test time 9 hrs 30 min.  Mean SpO2 92.8%, low SpO2 86%.  Spent 3 min with SpO2 < 88% Spirometry 10/26/13 >> FEV1 0.92 (30%), FEV1% 39  11/16/13 Follow up (COPD)   Returns for a 3 week follow up. Reports DOE is unchanged since last ov even with the prednisone.  Although after questioning does feel like he can do more on O2 that was started last ov with activity.  No flare in cough or wheezing.  pt stopped Symbicort/Spiriva and resumed [employer].  Has ONO that was positive for desats. He agreed to begin nocturnal O2 .    Review of Systems Constitutional:   No  weight loss, night sweats,  Fevers, chills,  +fatigue, or  lassitude.  HEENT:   No headaches,  Difficulty swallowing,  Tooth/dental problems, or  Sore throat,                No sneezing, itching, ear ache, nasal congestion, post nasal drip,   CV:  No chest pain,  Orthopnea, PND, swelling in lower extremities, anasarca, dizziness, palpitations, syncope.   GI  No heartburn, indigestion, abdominal pain, nausea, vomiting, diarrhea, change in bowel habits, loss of appetite, bloody stools.   Resp:    No chest wall deformity  Skin: no rash or lesions.  GU: no dysuria, change in color of urine, no urgency or frequency.  No flank pain, no hematuria   MS:  No joint pain or swelling.  No decreased range of motion.  No back pain.  Psych:  No change in mood or affect. No depression or anxiety.  No memory loss.         Objective:   Physical Exam GEN: A/Ox3; pleasant , NAD, elderly   HEENT:  Searcy/AT,  EACs-clear, TMs-wnl, NOSE-clear,  THROAT-clear, no lesions, no postnasal drip or exudate noted.   NECK:  Supple w/ fair ROM; no JVD; normal carotid impulses w/o bruits; no thyromegaly or nodules palpated; no lymphadenopathy.  RESP  Diminished BS in bases .no accessory muscle use, no dullness to percussion  CARD:  RRR, no m/r/g  , no peripheral edema, pulses intact, no cyanosis or clubbing.  GI:   Soft & nt; nml bowel sounds; no organomegaly or masses detected.  Musco: Warm bil, no deformities or joint swelling noted.   Neuro: alert, no focal deficits noted.    Skin: Warm, no lesions or rashes         Assessment & Plan:

## 2014-01-15 ENCOUNTER — Ambulatory Visit (INDEPENDENT_AMBULATORY_CARE_PROVIDER_SITE_OTHER): Payer: Medicare Other | Admitting: Pulmonary Disease

## 2014-01-15 ENCOUNTER — Encounter: Payer: Self-pay | Admitting: Pulmonary Disease

## 2014-01-15 VITALS — BP 132/80 | HR 71 | Temp 97.1°F | Ht 70.0 in | Wt 187.0 lb

## 2014-01-15 DIAGNOSIS — R0902 Hypoxemia: Secondary | ICD-10-CM

## 2014-01-15 DIAGNOSIS — J438 Other emphysema: Secondary | ICD-10-CM

## 2014-01-15 DIAGNOSIS — J9611 Chronic respiratory failure with hypoxia: Secondary | ICD-10-CM

## 2014-01-15 DIAGNOSIS — J439 Emphysema, unspecified: Secondary | ICD-10-CM

## 2014-01-15 DIAGNOSIS — G4734 Idiopathic sleep related nonobstructive alveolar hypoventilation: Secondary | ICD-10-CM

## 2014-01-15 DIAGNOSIS — J9610 Chronic respiratory failure, unspecified whether with hypoxia or hypercapnia: Secondary | ICD-10-CM

## 2014-01-15 MED ORDER — PREDNISONE 5 MG PO TABS: 2.5000 mg | ORAL_TABLET | Freq: Every day | ORAL | Status: DC

## 2014-01-15 NOTE — Patient Instructions (Signed)
Prednisone 5 mg pill >> take 1/2 pill daily Will arrange for overnight oxygen test while using 2 liters oxygen >> will call with results Follow up in 3 months

## 2014-01-15 NOTE — Progress Notes (Signed)
No chief complaint on file.   History of Present Illness: [name] is a 80 y.o. 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  His breathing has been doing better.  He is only using oxygen at night now.  He gets winded with activity.  He is not having as much cough.  He is not having sputum, wheeze, or leg swelling.  He has pulse ox meter and his numbers are always between 90 and 95% during the day.  He gets his medications through the [place].  TESTS: PFT 07/14/06 >> FEV1 1.41(51%), FEV1% 60, TLC 7.3(123%), DLCO 75%, no BD  PFT 07/29/10 >> FEV1 1.30(50%), FEV1% 50, TLC 6.39(100%), DLCO 68%, no BD Spirometry 11/17/10 >> FEV1 1.15(36%), FEV1% 56 ONO with RA 11/30/12 >> Test time 9 hrs 30 min.  Mean SpO2 92.8%, low SpO2 86%.  Spent 3 min with SpO2 < 88% Spirometry 10/26/13 >> FEV1 0.92 (30%), FEV1% 39  PMHx, PSHx, Medications, Allergies, Fhx, Shx reviewed.  Physical Exam:  General - No distress ENT - No sinus tenderness, no oral exudate, no LAN Cardiac - s1s2 regular, no murmur Chest - decreased breath sounds, no wheeze/rales Back - No focal tenderness Abd - Soft, non-tender Ext - No edema Neuro - Normal strength Skin - No rashes Psych - normal mood, and behavior   Assessment/Plan:  Sean Mires, MD [place] [facility]/Critical Care/Sleep Pager:  [phone] 01/15/2014, 11:24 AM

## 2014-01-15 NOTE — Assessment & Plan Note (Signed)
Oxygen needs have improved since starting low dose prednisone.  He does not need oxygen as much during the day >> he can still use as needed with exercising.  Will arrange for ONO on 2 liters.

## 2014-01-15 NOTE — Assessment & Plan Note (Signed)
Improved since the additional of low dose prednisone.  Will change to 2.5 mg prednisone daily.  He is to continue spiriva, symbicort, and prn albuterol.

## 2014-01-23 ENCOUNTER — Telehealth: Payer: Self-pay | Admitting: Pulmonary Disease

## 2014-01-23 NOTE — Telephone Encounter (Signed)
ONO with 2 liters 01/16/14 >> test time 8 hrs 36 min.  Basal SpO2 93%, low SpO2 85%.  Spent 4 min with SpO2 < 88%.  Will have my nurse inform pt that ONO looks good on current set up.  No change to current home oxygen set up needed.

## 2014-01-24 NOTE — Telephone Encounter (Signed)
Results have been explained to patient, pt expressed understanding. Nothing further needed.  

## 2014-03-17 ENCOUNTER — Encounter (HOSPITAL_COMMUNITY): Payer: Self-pay | Admitting: Emergency Medicine

## 2014-03-17 ENCOUNTER — Emergency Department (HOSPITAL_COMMUNITY)
Admission: EM | Admit: 2014-03-17 | Discharge: 2014-03-17 | Disposition: A | Discharge status: Home / Self Care | Payer: Medicare Other | Attending: Emergency Medicine | Admitting: Emergency Medicine

## 2014-03-17 ENCOUNTER — Emergency Department (HOSPITAL_COMMUNITY): Payer: Medicare Other

## 2014-03-17 DIAGNOSIS — R42 Dizziness and giddiness: Secondary | ICD-10-CM | POA: Diagnosis not present

## 2014-03-17 DIAGNOSIS — Y9389 Activity, other specified: Secondary | ICD-10-CM | POA: Insufficient documentation

## 2014-03-17 DIAGNOSIS — Z87891 Personal history of nicotine dependence: Secondary | ICD-10-CM | POA: Insufficient documentation

## 2014-03-17 DIAGNOSIS — Z79899 Other long term (current) drug therapy: Secondary | ICD-10-CM | POA: Diagnosis not present

## 2014-03-17 DIAGNOSIS — Z23 Encounter for immunization: Secondary | ICD-10-CM | POA: Insufficient documentation

## 2014-03-17 DIAGNOSIS — W1809XA Striking against other object with subsequent fall, initial encounter: Secondary | ICD-10-CM | POA: Diagnosis not present

## 2014-03-17 DIAGNOSIS — J449 Chronic obstructive pulmonary disease, unspecified: Secondary | ICD-10-CM | POA: Diagnosis not present

## 2014-03-17 DIAGNOSIS — Y9289 Other specified places as the place of occurrence of the external cause: Secondary | ICD-10-CM | POA: Insufficient documentation

## 2014-03-17 DIAGNOSIS — J4489 Other specified chronic obstructive pulmonary disease: Secondary | ICD-10-CM | POA: Insufficient documentation

## 2014-03-17 DIAGNOSIS — S0180XA Unspecified open wound of other part of head, initial encounter: Secondary | ICD-10-CM | POA: Insufficient documentation

## 2014-03-17 DIAGNOSIS — IMO0002 Reserved for concepts with insufficient information to code with codable children: Secondary | ICD-10-CM | POA: Insufficient documentation

## 2014-03-17 DIAGNOSIS — E785 Hyperlipidemia, unspecified: Secondary | ICD-10-CM | POA: Diagnosis not present

## 2014-03-17 DIAGNOSIS — E039 Hypothyroidism, unspecified: Secondary | ICD-10-CM | POA: Diagnosis not present

## 2014-03-17 DIAGNOSIS — W19XXXA Unspecified fall, initial encounter: Secondary | ICD-10-CM

## 2014-03-17 DIAGNOSIS — S0181XA Laceration without foreign body of other part of head, initial encounter: Secondary | ICD-10-CM

## 2014-03-17 MED ORDER — TETANUS-DIPHTH-ACELL PERTUSSIS 5-2.5-18.5 LF-MCG/0.5 IM SUSP
0.5000 mL | Freq: Once | INTRAMUSCULAR | Status: AC
  Administered 2014-03-17: 0.5 mL via INTRAMUSCULAR
  Filled 2014-03-17: qty 0.5

## 2014-03-17 MED ORDER — LIDOCAINE-EPINEPHRINE 2 %-1:100000 IJ SOLN
30.0000 mL | Freq: Once | INTRAMUSCULAR | Status: AC
Start: 2014-03-17 — End: 2014-03-17
  Administered 2014-03-17: 30 mL
  Filled 2014-03-17: qty 2

## 2014-03-17 NOTE — ED Provider Notes (Signed)
Medical screening examination/treatment/procedure(s) were conducted as a shared visit with non-physician practitioner(s) and myself.  I personally evaluated the patient during the encounter.   EKG Interpretation   Date/Time:  Sunday March 17 2014 15:51:26 EDT Ventricular Rate:  65 PR Interval:  145 QRS Duration: 87 QT Interval:  383 QTC Calculation: 398 R Axis:   35 Text Interpretation:  Sinus rhythm Paired ventricular premature complexes  Low voltage, precordial leads No significant change was found Confirmed by  Wyvonnia Dusky  MD, Sacoya Mcgourty [phone]) on 03/17/2014 4:05:25 PM        Ezequiel Essex, MD 03/17/14 2333

## 2014-03-17 NOTE — Discharge Instructions (Signed)
Facial Laceration Follow up for suture removal in 1 week. Return to the ED if you develop new or worsening symptoms.  A facial laceration is a cut on the face. These injuries can be painful and cause bleeding. Lacerations usually heal quickly, but they need special care to reduce scarring. DIAGNOSIS  Your health care provider will take a medical history, ask for details about how the injury occurred, and examine the wound to determine how deep the cut is. TREATMENT  Some facial lacerations may not require closure. Others may not be able to be closed because of an increased risk of infection. The risk of infection and the chance for successful closure will depend on various factors, including the amount of time since the injury occurred. The wound may be cleaned to help prevent infection. If closure is appropriate, pain medicines may be given if needed. Your health care provider will use stitches (sutures), wound glue (adhesive), or skin adhesive strips to repair the laceration. These tools bring the skin edges together to allow for faster healing and a better cosmetic outcome. If needed, you may also be given a tetanus shot. HOME CARE INSTRUCTIONS  Only take over-the-counter or prescription medicines as directed by your health care provider.  Follow your health care provider's instructions for wound care. These instructions will vary depending on the technique used for closing the wound. For Sutures:  Keep the wound clean and dry.   If you were given a bandage (dressing), you should change it at least once a day. Also change the dressing if it becomes wet or dirty, or as directed by your health care provider.   Wash the wound with soap and water 2 times a day. Rinse the wound off with water to remove all soap. Pat the wound dry with a clean towel.   After cleaning, apply a thin layer of the antibiotic ointment recommended by your health care provider. This will help prevent infection and keep  the dressing from sticking.   You may shower as usual after the first 24 hours. Do not soak the wound in water until the sutures are removed.   Get your sutures removed as directed by your health care provider. With facial lacerations, sutures should usually be taken out after 4-5 days to avoid stitch marks.   Wait a few days after your sutures are removed before applying any makeup. For Skin Adhesive Strips:  Keep the wound clean and dry.   Do not get the skin adhesive strips wet. You may bathe carefully, using caution to keep the wound dry.   If the wound gets wet, pat it dry with a clean towel.   Skin adhesive strips will fall off on their own. You may trim the strips as the wound heals. Do not remove skin adhesive strips that are still stuck to the wound. They will fall off in time.  For Wound Adhesive:  You may briefly wet your wound in the shower or bath. Do not soak or scrub the wound. Do not swim. Avoid periods of heavy sweating until the skin adhesive has fallen off on its own. After showering or bathing, gently pat the wound dry with a clean towel.   Do not apply liquid medicine, cream medicine, ointment medicine, or makeup to your wound while the skin adhesive is in place. This may loosen the film before your wound is healed.   If a dressing is placed over the wound, be careful not to apply tape directly over the  skin adhesive. This may cause the adhesive to be pulled off before the wound is healed.   Avoid prolonged exposure to sunlight or tanning lamps while the skin adhesive is in place.  The skin adhesive will usually remain in place for 5-10 days, then naturally fall off the skin. Do not pick at the adhesive film.  After Healing: Once the wound has healed, cover the wound with sunscreen during the day for 1 full year. This can help minimize scarring. Exposure to ultraviolet light in the first year will darken the scar. It can take 1-2 years for the scar to lose  its redness and to heal completely.  SEEK IMMEDIATE MEDICAL CARE IF:  You have redness, pain, or swelling around the wound.   You see ayellowish-white fluid (pus) coming from the wound.   You have chills or a fever.  MAKE SURE YOU:  Understand these instructions.  Will watch your condition.  Will get help right away if you are not doing well or get worse. Document Released: 07/15/2004 Document Revised: 03/28/2013 Document Reviewed: 01/18/2013 [facility] Patient Information 2015 [place], [place]. This information is not intended to replace advice given to you by your health care provider. Make sure you discuss any questions you have with your health care provider.

## 2014-03-17 NOTE — ED Notes (Signed)
He remains oriented and in no distress.

## 2014-03-17 NOTE — ED Notes (Signed)
Pt states that he is sometimes dizzy when he stands up too fast and this morning he was dizzy when he got out of bed causing him to fall and hit his chin on the nightside table. States no LOC or head trauma. Alert, oriented and ambulatory.

## 2014-03-17 NOTE — ED Notes (Signed)
He ambulates without difficulty, other than his usual exertional dyspnea, which he states is chronic, because "I have COPD".

## 2014-03-17 NOTE — ED Provider Notes (Signed)
CSN: [id number]     Arrival date & time 03/17/14  1512 History   First MD Initiated Contact with Patient 03/17/14 1545     Chief Complaint  Patient presents with  . Fall  . Facial Laceration     (Consider location/radiation/quality/duration/timing/severity/associated sxs/prior Treatment) HPI Comments: Patient presents with pain and laceration to left shin after striking bedside table at 8 AM. States he got out of bed too quickly and felt off-balance and struck his chin. Did not lose consciousness. Denies any headache, chest pain, neck pain, back pain or abdominal pain. He felt well he went to church and went out to eat. His wife saw the laceration later and decided he should come in. He denies any focal weakness, numbness or tingling. Denies any vertigo. Denies any episodes of syncope. Denies any abdominal pain, nausea vomiting. Last tetanus was 10 years ago. He has a history of COPD hyperlipidemia. Denies any cardiac history.  The history is provided by the patient and the spouse.    Past Medical History  Diagnosis Date  . COPD (chronic obstructive pulmonary disease)   . Hyperlipidemia   . Hypothyroidism   . Pulmonary nodule, right   . Tobacco abuse     Quit 09/17/10   Past Surgical History  Procedure Laterality Date  . Tracheostomy      after aspiration event  . Electronavigational bronchoscopy  07/2010   History reviewed. No pertinent family history. History  Substance Use Topics  . Smoking status: Former Smoker -- 1.00 packs/day for 55 years    Types: Cigarettes    Quit date: 04/22/2011  . Smokeless tobacco: Never Used     Comment: occasionally smoked 2-3 times a week--1 cig each time  . Alcohol Use: Yes     Comment: rare wine drinker    Review of Systems  Constitutional: Negative for fever, activity change and appetite change.  HENT: Negative for congestion and rhinorrhea.   Respiratory: Negative for cough, chest tightness and shortness of breath.   Cardiovascular:  Negative for chest pain.  Gastrointestinal: Negative for nausea, vomiting and abdominal pain.  Genitourinary: Negative for dysuria.  Musculoskeletal: Negative for arthralgias and myalgias.  Neurological: Positive for dizziness and light-headedness. Negative for weakness and headaches.  A complete 10 system review of systems was obtained and all systems are negative except as noted in the HPI and PMH.      Allergies  Sulfonamide derivatives  Home Medications   Prior to Admission medications   Medication Sig Start Date End Date Taking? Authorizing Provider  albuterol (VENTOLIN HFA) 108 (90 BASE) MCG/ACT inhaler Inhale 2 puffs into the lungs every 4 (four) hours as needed.      Historical Provider, MD  budesonide-formoterol (SYMBICORT) 160-4.5 MCG/ACT inhaler Inhale 2 puffs into the lungs 2 (two) times daily. 06/22/13   [name], MD  levothyroxine (SYNTHROID, LEVOTHROID) 50 MCG tablet Take 50 mcg by mouth daily.    Historical Provider, MD  predniSONE (DELTASONE) 5 MG tablet Take 0.5 tablets (2.5 mg total) by mouth daily with breakfast. 01/15/14   [name], MD  simvastatin (ZOCOR) 5 MG tablet Take 5 mg by mouth at bedtime.      Historical Provider, MD  tiotropium (SPIRIVA HANDIHALER) 18 MCG inhalation capsule Place 1 capsule (18 mcg total) into inhaler and inhale daily. 06/22/13   [name], MD   BP 141/73  Pulse 89  Temp(Src) 98.4 F (36.9 C) (Oral)  Resp 16  SpO2 95% Physical Exam  Nursing note  and vitals reviewed. Constitutional: He is oriented to person, place, and time. He appears well-developed and well-nourished. No distress.  HENT:  Head: Normocephalic and atraumatic.  Mouth/Throat: Oropharynx is clear and moist. No oropharyngeal exudate.  3 cm transverse laceration to left chin and mandible. Bleeding controlled. Patient is edentulous. Dentures removed. There is no tenderness along the mandible. No malocclusion or trismus  Eyes: Conjunctivae and EOM are normal. Pupils  are equal, round, and reactive to light.  Neck: Normal range of motion. Neck supple.  No C-spine tenderness  Cardiovascular: Normal rate, regular rhythm, normal heart sounds and intact distal pulses.   No murmur heard. Pulmonary/Chest: Effort normal and breath sounds normal. No respiratory distress.  Decreased BS throughout  Abdominal: Soft. There is no tenderness. There is no rebound and no guarding.  Musculoskeletal: Normal range of motion. He exhibits no edema and no tenderness.  Neurological: He is alert and oriented to person, place, and time. No cranial nerve deficit. He exhibits normal muscle tone. Coordination normal.  No ataxia on finger to nose bilaterally. No pronator drift. 5/5 strength throughout. CN 2-12 intact. Negative Romberg. Equal grip strength. Sensation intact. Gait is normal.   Skin: Skin is warm.  Psychiatric: He has a normal mood and affect. His behavior is normal.    ED Course  Procedures (including critical care time) Labs Review Labs Reviewed - No data to display  Imaging Review Ct Head Wo Contrast  03/17/2014   CLINICAL DATA:  Dizziness. Fell and hit left side of chin on a table. No loss of consciousness.  EXAM: CT HEAD WITHOUT CONTRAST  CT MAXILLOFACIAL WITHOUT CONTRAST  CT CERVICAL SPINE WITHOUT CONTRAST  TECHNIQUE: Multidetector CT imaging of the head, cervical spine, and maxillofacial structures were performed using the standard protocol without intravenous contrast. Multiplanar CT image reconstructions of the cervical spine and maxillofacial structures were also generated.  COMPARISON:  None.  FINDINGS: CT HEAD FINDINGS  There is mild central and cortical atrophy. Periventricular white matter changes are consistent with small vessel disease. There is no intra or extra-axial fluid collection or mass lesion. The basilar cisterns and ventricles have a normal appearance. There is no CT evidence for acute infarction or hemorrhage. Bone windows show no acute findings.   CT MAXILLOFACIAL FINDINGS  The globes and orbits are intact. The zygomatic arches are intact. The paranasal sinuses are well aerated. No evidence for sinus wall fracture. The mandible and temporomandibular joints are intact. The pterygoid plates are intact.  There is a bone density adjacent to the anterior maxillary process. However this appears well corticated and may represent a site of a previous injury. Correlation is recommended with tenderness in this region. There is abrupt deviation of the bony nasal septum towards the left without associated soft tissue edema or hematoma. This they represent site of prior trauma. No evidence for acute nasal bone fracture.  CT CERVICAL SPINE FINDINGS  There is loss of cervical lordosis. This may be secondary to splinting, soft tissue injury, or positioning. There is moderate mid cervical spine degenerative change, and notably at C3-4, C4-5, C5-6. At these levels there is significant disc height loss and large osteophytes. New there is no evidence for acute fracture or acute subluxation.  Small low-attenuation lesion in the left lobe of the thyroid is 1.0 x 1.4 cm.  IMPRESSION: 1. Central and cortical atrophy. 2.  No evidence for acute intracranial abnormality. 3. Acute versus chronic injury of the anterior maxillary process and bony nasal septum. The appearance  favors old injury over acute problem. Correlation with point tenderness is recommended. 4. The mandible and temporomandibular joints are intact. 5. Significant cervical spine degenerative change without acute abnormality. 6. Incidental note of a left thyroid nodule, 1.4 cm maximum diameter. No further evaluation is felt to be necessary based on the appearance. This follows ACR consensus guidelines: Managing Incidental Thyroid Nodules Detected on Imaging: White Paper of the ACR Incidental Thyroid Findings Committee. J Am Coll Radiol 2015; 12:143-150.   Electronically Signed   By: Shon Hale M.D.   On: 03/17/2014  17:31   Ct Cervical Spine Wo Contrast  03/17/2014   CLINICAL DATA:  Dizziness. Fell and hit left side of chin on a table. No loss of consciousness.  EXAM: CT HEAD WITHOUT CONTRAST  CT MAXILLOFACIAL WITHOUT CONTRAST  CT CERVICAL SPINE WITHOUT CONTRAST  TECHNIQUE: Multidetector CT imaging of the head, cervical spine, and maxillofacial structures were performed using the standard protocol without intravenous contrast. Multiplanar CT image reconstructions of the cervical spine and maxillofacial structures were also generated.  COMPARISON:  None.  FINDINGS: CT HEAD FINDINGS  There is mild central and cortical atrophy. Periventricular white matter changes are consistent with small vessel disease. There is no intra or extra-axial fluid collection or mass lesion. The basilar cisterns and ventricles have a normal appearance. There is no CT evidence for acute infarction or hemorrhage. Bone windows show no acute findings.  CT MAXILLOFACIAL FINDINGS  The globes and orbits are intact. The zygomatic arches are intact. The paranasal sinuses are well aerated. No evidence for sinus wall fracture. The mandible and temporomandibular joints are intact. The pterygoid plates are intact.  There is a bone density adjacent to the anterior maxillary process. However this appears well corticated and may represent a site of a previous injury. Correlation is recommended with tenderness in this region. There is abrupt deviation of the bony nasal septum towards the left without associated soft tissue edema or hematoma. This they represent site of prior trauma. No evidence for acute nasal bone fracture.  CT CERVICAL SPINE FINDINGS  There is loss of cervical lordosis. This may be secondary to splinting, soft tissue injury, or positioning. There is moderate mid cervical spine degenerative change, and notably at C3-4, C4-5, C5-6. At these levels there is significant disc height loss and large osteophytes. New there is no evidence for acute  fracture or acute subluxation.  Small low-attenuation lesion in the left lobe of the thyroid is 1.0 x 1.4 cm.  IMPRESSION: 1. Central and cortical atrophy. 2.  No evidence for acute intracranial abnormality. 3. Acute versus chronic injury of the anterior maxillary process and bony nasal septum. The appearance favors old injury over acute problem. Correlation with point tenderness is recommended. 4. The mandible and temporomandibular joints are intact. 5. Significant cervical spine degenerative change without acute abnormality. 6. Incidental note of a left thyroid nodule, 1.4 cm maximum diameter. No further evaluation is felt to be necessary based on the appearance. This follows ACR consensus guidelines: Managing Incidental Thyroid Nodules Detected on Imaging: White Paper of the ACR Incidental Thyroid Findings Committee. J Am Coll Radiol 2015; 12:143-150.   Electronically Signed   By: Shon Hale M.D.   On: 03/17/2014 17:31   Ct Maxillofacial Wo Cm  03/17/2014   CLINICAL DATA:  Dizziness. Fell and hit left side of chin on a table. No loss of consciousness.  EXAM: CT HEAD WITHOUT CONTRAST  CT MAXILLOFACIAL WITHOUT CONTRAST  CT CERVICAL SPINE WITHOUT CONTRAST  TECHNIQUE:  Multidetector CT imaging of the head, cervical spine, and maxillofacial structures were performed using the standard protocol without intravenous contrast. Multiplanar CT image reconstructions of the cervical spine and maxillofacial structures were also generated.  COMPARISON:  None.  FINDINGS: CT HEAD FINDINGS  There is mild central and cortical atrophy. Periventricular white matter changes are consistent with small vessel disease. There is no intra or extra-axial fluid collection or mass lesion. The basilar cisterns and ventricles have a normal appearance. There is no CT evidence for acute infarction or hemorrhage. Bone windows show no acute findings.  CT MAXILLOFACIAL FINDINGS  The globes and orbits are intact. The zygomatic arches are intact.  The paranasal sinuses are well aerated. No evidence for sinus wall fracture. The mandible and temporomandibular joints are intact. The pterygoid plates are intact.  There is a bone density adjacent to the anterior maxillary process. However this appears well corticated and may represent a site of a previous injury. Correlation is recommended with tenderness in this region. There is abrupt deviation of the bony nasal septum towards the left without associated soft tissue edema or hematoma. This they represent site of prior trauma. No evidence for acute nasal bone fracture.  CT CERVICAL SPINE FINDINGS  There is loss of cervical lordosis. This may be secondary to splinting, soft tissue injury, or positioning. There is moderate mid cervical spine degenerative change, and notably at C3-4, C4-5, C5-6. At these levels there is significant disc height loss and large osteophytes. New there is no evidence for acute fracture or acute subluxation.  Small low-attenuation lesion in the left lobe of the thyroid is 1.0 x 1.4 cm.  IMPRESSION: 1. Central and cortical atrophy. 2.  No evidence for acute intracranial abnormality. 3. Acute versus chronic injury of the anterior maxillary process and bony nasal septum. The appearance favors old injury over acute problem. Correlation with point tenderness is recommended. 4. The mandible and temporomandibular joints are intact. 5. Significant cervical spine degenerative change without acute abnormality. 6. Incidental note of a left thyroid nodule, 1.4 cm maximum diameter. No further evaluation is felt to be necessary based on the appearance. This follows ACR consensus guidelines: Managing Incidental Thyroid Nodules Detected on Imaging: White Paper of the ACR Incidental Thyroid Findings Committee. J Am Coll Radiol 2015; 12:143-150.   Electronically Signed   By: Shon Hale M.D.   On: 03/17/2014 17:31     EKG Interpretation   Date/Time:  Sunday March 17 2014 15:51:26 EDT Ventricular  Rate:  65 PR Interval:  145 QRS Duration: 87 QT Interval:  383 QTC Calculation: 398 R Axis:   35 Text Interpretation:  Sinus rhythm Paired ventricular premature complexes  Low voltage, precordial leads No significant change was found Confirmed by  Wyvonnia Dusky  MD, [place] [phone]) on 03/17/2014 4:05:25 PM      MDM   Final diagnoses:  Fall, initial encounter  Chin laceration, initial encounter   fall from getting up too fast with laceration to chin. Neurologically intact. No chest pain or shortness of breath. EKG sinus rhythm with PVCs.  EKG shows sinus rhythm. Tetanus updated. Laceration repaired by PA Harris. Orthostatics negative.  CT shows no intracranial abnormality. There is a chronic injury of maxillary process and the nasal septum. No acute facial injury apparent on imaging.  Patient denies any previous facial injury. He is made aware of CAT scan results. He is aware of thyroid nodule.  He is ambulatory without difficulty. Instructed to followup for suture removal in 5-7 days. Return  precautions discussed.     Ezequiel Essex, MD 03/17/14 2131

## 2014-03-17 NOTE — ED Provider Notes (Signed)
5:45 PM BP 152/93  Pulse 80  Temp(Src) 97.5 F (36.4 C) (Oral)  SpO2 97% I was asked by Dr. Wyvonnia Dusky to perform laceration repair. I was not involved in any other aspect of care, management, or medical decision making during the patients visit.  LACERATION REPAIR Performed by: Margarita Mail Authorized by: Margarita Mail Consent: Verbal consent obtained. Risks and benefits: risks, benefits and alternatives were discussed Consent given by: patient Patient identity confirmed: provided demographic data Prepped and Draped in normal sterile fashion Wound explored  Laceration Location: chin  Laceration Length: 3 cm  No Foreign Bodies seen or palpated  Anesthesia: local infiltration  Local anesthetic: lidocaine 2% w epinephrine  Anesthetic total: 6 ml  Irrigation method: syringe Amount of cleaning: standard  Skin closure: 5.0 ethilon  Number of sutures: 5  Technique: SI  Patient tolerance: Patient tolerated the procedure well with no immediate complications.   Margarita Mail, PA-C 03/17/14 1747

## 2014-03-22 ENCOUNTER — Emergency Department (HOSPITAL_COMMUNITY)
Admission: EM | Admit: 2014-03-22 | Discharge: 2014-03-22 | Disposition: A | Discharge status: Home / Self Care | Payer: Medicare Other | Attending: Emergency Medicine | Admitting: Emergency Medicine

## 2014-03-22 ENCOUNTER — Encounter (HOSPITAL_COMMUNITY): Payer: Self-pay | Admitting: Emergency Medicine

## 2014-03-22 DIAGNOSIS — Z79899 Other long term (current) drug therapy: Secondary | ICD-10-CM | POA: Insufficient documentation

## 2014-03-22 DIAGNOSIS — J449 Chronic obstructive pulmonary disease, unspecified: Secondary | ICD-10-CM | POA: Insufficient documentation

## 2014-03-22 DIAGNOSIS — E039 Hypothyroidism, unspecified: Secondary | ICD-10-CM | POA: Insufficient documentation

## 2014-03-22 DIAGNOSIS — Z4802 Encounter for removal of sutures: Secondary | ICD-10-CM | POA: Insufficient documentation

## 2014-03-22 DIAGNOSIS — Z87891 Personal history of nicotine dependence: Secondary | ICD-10-CM | POA: Diagnosis not present

## 2014-03-22 DIAGNOSIS — E785 Hyperlipidemia, unspecified: Secondary | ICD-10-CM | POA: Diagnosis not present

## 2014-03-22 NOTE — ED Provider Notes (Signed)
CSN: [id number]     Arrival date & time 03/22/14  1031 History   First MD Initiated Contact with Patient 03/22/14 1036     Chief Complaint  Patient presents with  . Suture / Staple Removal     (Consider location/radiation/quality/duration/timing/severity/associated sxs/prior Treatment) HPI Comments: Patient with 5 sutures in chin x 5 days after falling. States no problems with healing. No drainage, swelling, or pain. Patient has history of COPD with some baseline shortness of breath. Patient is very clear that he is in his normal state of health today without changes. No fever. The onset of laceraton was acute. The course is improving. Aggravating factors: none. Alleviating factors: none.    Patient is a 80 y.o. male presenting with suture removal. The history is provided by the patient and medical records.  Suture / Staple Removal Pertinent negatives include no arthralgias, fever or myalgias.    Past Medical History  Diagnosis Date  . COPD (chronic obstructive pulmonary disease)   . Hyperlipidemia   . Hypothyroidism   . Pulmonary nodule, right   . Tobacco abuse     Quit 09/17/10   Past Surgical History  Procedure Laterality Date  . Tracheostomy      after aspiration event  . Electronavigational bronchoscopy  07/2010   History reviewed. No pertinent family history. History  Substance Use Topics  . Smoking status: Former Smoker -- 1.00 packs/day for 55 years    Types: Cigarettes    Quit date: 04/22/2011  . Smokeless tobacco: Never Used     Comment: occasionally smoked 2-3 times a week--1 cig each time  . Alcohol Use: Yes     Comment: rare wine drinker    Review of Systems  Constitutional: Negative for fever.  Musculoskeletal: Negative for arthralgias and myalgias.  Skin: Positive for wound. Negative for color change.      Allergies  Sulfonamide derivatives  Home Medications   Prior to Admission medications   Medication Sig Start Date End Date Taking?  Authorizing Provider  albuterol (VENTOLIN HFA) 108 (90 BASE) MCG/ACT inhaler Inhale 2 puffs into the lungs every 4 (four) hours as needed.      Historical Provider, MD  budesonide-formoterol (SYMBICORT) 160-4.5 MCG/ACT inhaler Inhale 2 puffs into the lungs 2 (two) times daily. 06/22/13   [name], MD  levothyroxine (SYNTHROID, LEVOTHROID) 50 MCG tablet Take 50 mcg by mouth daily.    Historical Provider, MD  predniSONE (DELTASONE) 5 MG tablet Take 0.5 tablets (2.5 mg total) by mouth daily with breakfast. 01/15/14   [name], MD  simvastatin (ZOCOR) 5 MG tablet Take 5 mg by mouth at bedtime.      Historical Provider, MD  tiotropium (SPIRIVA HANDIHALER) 18 MCG inhalation capsule Place 1 capsule (18 mcg total) into inhaler and inhale daily. 06/22/13   [name], MD   BP 173/52  Pulse 81  Temp(Src) 97.6 F (36.4 C) (Oral)  Resp 24  SpO2 100% Physical Exam  Nursing note and vitals reviewed. Constitutional: He appears well-developed and well-nourished.  HENT:  Head: Normocephalic.  5 Ethilon sutures in place over chin. Wound appears to be well-healing.   Eyes: Conjunctivae are normal.  Neck: Normal range of motion. Neck supple.  Cardiovascular: Normal rate.   Approx 60 bpm.   Pulmonary/Chest: No respiratory distress.  Slight tachypnea.  Neurological: He is alert.  Skin: Skin is warm and dry.  Psychiatric: He has a normal mood and affect.    ED Course  Procedures (including critical care time)  Labs Review Labs Reviewed - No data to display  Imaging Review No results found.   EKG Interpretation None      10:58 AM Patient seen and examined.   Vital signs reviewed and are as follows: BP 173/52  Pulse 81  Temp(Src) 97.6 F (36.4 C) (Oral)  Resp 24  SpO2 100%   SUTURE REMOVAL Performed by: Nolene Ebbs PA-S2  Consent: Verbal consent obtained. Patient identity confirmed: provided demographic data Time out: Immediately prior to procedure a "time out" was  called to verify the correct patient, procedure, equipment, support staff and site/side marked as required.  Location details: chin  Wound Appearance: clean  Sutures/Staples Removed: 5  Facility: sutures placed in this facility Patient tolerance: Patient tolerated the procedure well with no immediate complications.  Pt urged to return with worsening pain, worsening swelling, expanding area of redness or streaking up extremity, fever, or any other concerns. Pt verbalizes understanding and agrees with plan.    MDM   Final diagnoses:  Visit for suture removal   Sutures removed.   Pt encouraged to f/u with PCP regarding HTN today and COPD. Patient is in his usual state of health today. No new health problems suggested by exam or patient history.     Carlisle Cater, PA-C 03/22/14 1059

## 2014-03-22 NOTE — Discharge Instructions (Signed)
Please read and follow all provided instructions.  Your diagnoses today include:  1. Visit for suture removal    Tests performed today include:  Vital signs. See below for your results today.   Medications prescribed:   None  Take any prescribed medications only as directed.   Home care instructions:  Follow any educational materials and wound care instructions contained in this packet.   Follow-up instructions: your doctor as needed  Return instructions:  Return to the Emergency Department if you have:  Fever  Worsening pain  Worsening swelling of the wound  Pus draining from the wound  Redness of the skin that moves away from the wound, especially if it streaks away from the affected area   Any other emergent concerns  Your vital signs today were: BP 173/52   Pulse 81   Temp(Src) 97.6 F (36.4 C) (Oral)   Resp 24   SpO2 100% If your blood pressure (BP) was elevated above 135/85 this visit, please have this repeated by your doctor within one month. --------------

## 2014-03-22 NOTE — ED Notes (Signed)
Pt fell Sunday getting out of bed. Pt was checked out then and cleared medically. Pt has had no new symptoms or concerns.  Pt here to get sutures removed from chin.  Pt breathing is increased in assessment but states that is normal with his COPD.  He is not concerned for any changes in respiratory.

## 2014-03-22 NOTE — ED Provider Notes (Signed)
Medical screening examination/treatment/procedure(s) were conducted as a shared visit with non-physician practitioner(s) and myself.  I personally evaluated the patient during the encounter.   EKG Interpretation None       Patient with uncomplicated course since chin injury. No signs of wound infection. Will remove sutures  Ephraim Hamburger, MD 03/22/14 [phone]

## 2014-05-14 ENCOUNTER — Encounter: Payer: Self-pay | Admitting: Pulmonary Disease

## 2014-05-14 ENCOUNTER — Ambulatory Visit (INDEPENDENT_AMBULATORY_CARE_PROVIDER_SITE_OTHER): Payer: Medicare Other | Admitting: Pulmonary Disease

## 2014-05-14 VITALS — BP 130/60 | HR 94 | Temp 97.7°F | Ht 70.0 in | Wt 195.8 lb

## 2014-05-14 DIAGNOSIS — J9611 Chronic respiratory failure with hypoxia: Secondary | ICD-10-CM

## 2014-05-14 DIAGNOSIS — J432 Centrilobular emphysema: Secondary | ICD-10-CM

## 2014-05-14 NOTE — Progress Notes (Signed)
Chief Complaint  Patient presents with  . Follow-up    Pt reports no change- no complaints.     History of Present Illness: [name] is a 80 y.o. 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  His breathing has been stable.  He does not have much cough or wheeze.  He will get sputum occasionally, and sometimes this is difficult to expectorate.  He tries to keep up with his activity.  He has trouble walking outside, but does okay on a treadmill.  He uses his oxygen at night.  He gets his medications through the [place].  TESTS: PFT 07/14/06 >> FEV1 1.41(51%), FEV1% 60, TLC 7.3(123%), DLCO 75%, no BD  PFT 07/29/10 >> FEV1 1.30(50%), FEV1% 50, TLC 6.39(100%), DLCO 68%, no BD Spirometry 11/17/10 >> FEV1 1.15(36%), FEV1% 56 ONO with RA 11/30/12 >> Test time 9 hrs 30 min.  Mean SpO2 92.8%, low SpO2 86%.  Spent 3 min with SpO2 < 88% Spirometry 10/26/13 >> FEV1 0.92 (30%), FEV1% 39 ONO with 2 liters 01/16/14 >> test time 8 hrs 36 min. Basal SpO2 93%, low SpO2 85%. Spent 4 min with SpO2 < 88%.  PMHx >> HLD, Hypothyroidism  PSHx, Medications, Allergies, Fhx, Shx reviewed.  Physical Exam:  General - No distress ENT - No sinus tenderness, no oral exudate, no LAN Cardiac - s1s2 regular, no murmur Chest - decreased breath sounds, no wheeze/rales Back - No focal tenderness Abd - Soft, non-tender Ext - No edema Neuro - Normal strength Skin - No rashes Psych - normal mood, and behavior   Impression:  COPD with emphysema.  Prednisone dependent since May 2015. Plan: - stable on spiriva, symbicort, and prn ventolin >> discussed option of d/c'ing ICS, but he would like to continue current regimen - he is to continue 2.5 mg prednisone daily  Chronic hypoxic respiratory failure. Plan: - he is to continue 2 liters oxygen at night - explained that he could use oxygen with activity during the day to help with his exercise  tolerance  Sean Mires, MD [facility] Pager:  [phone]

## 2014-05-14 NOTE — Patient Instructions (Signed)
Follow up in 6 months 

## 2014-06-03 ENCOUNTER — Inpatient Hospital Stay (HOSPITAL_COMMUNITY)
Admission: EM | Admit: 2014-06-03 | Discharge: 2014-06-07 | DRG: 481 | Disposition: A | Discharge status: Rehabilitation Facility | Payer: Medicare Other | Attending: Internal Medicine | Admitting: Internal Medicine

## 2014-06-03 ENCOUNTER — Emergency Department (HOSPITAL_COMMUNITY): Payer: Medicare Other

## 2014-06-03 ENCOUNTER — Encounter (HOSPITAL_COMMUNITY): Payer: Self-pay

## 2014-06-03 DIAGNOSIS — E785 Hyperlipidemia, unspecified: Secondary | ICD-10-CM | POA: Diagnosis present

## 2014-06-03 DIAGNOSIS — R339 Retention of urine, unspecified: Secondary | ICD-10-CM | POA: Diagnosis not present

## 2014-06-03 DIAGNOSIS — Z87891 Personal history of nicotine dependence: Secondary | ICD-10-CM | POA: Diagnosis not present

## 2014-06-03 DIAGNOSIS — R110 Nausea: Secondary | ICD-10-CM | POA: Diagnosis not present

## 2014-06-03 DIAGNOSIS — D62 Acute posthemorrhagic anemia: Secondary | ICD-10-CM | POA: Diagnosis not present

## 2014-06-03 DIAGNOSIS — S7222XA Displaced subtrochanteric fracture of left femur, initial encounter for closed fracture: Secondary | ICD-10-CM | POA: Diagnosis present

## 2014-06-03 DIAGNOSIS — Z9981 Dependence on supplemental oxygen: Secondary | ICD-10-CM | POA: Diagnosis not present

## 2014-06-03 DIAGNOSIS — S72009A Fracture of unspecified part of neck of unspecified femur, initial encounter for closed fracture: Secondary | ICD-10-CM | POA: Insufficient documentation

## 2014-06-03 DIAGNOSIS — J449 Chronic obstructive pulmonary disease, unspecified: Secondary | ICD-10-CM | POA: Diagnosis present

## 2014-06-03 DIAGNOSIS — W010XXA Fall on same level from slipping, tripping and stumbling without subsequent striking against object, initial encounter: Secondary | ICD-10-CM | POA: Diagnosis present

## 2014-06-03 DIAGNOSIS — E039 Hypothyroidism, unspecified: Secondary | ICD-10-CM | POA: Diagnosis present

## 2014-06-03 DIAGNOSIS — M25552 Pain in left hip: Secondary | ICD-10-CM | POA: Diagnosis not present

## 2014-06-03 DIAGNOSIS — J9611 Chronic respiratory failure with hypoxia: Secondary | ICD-10-CM | POA: Diagnosis present

## 2014-06-03 DIAGNOSIS — Z882 Allergy status to sulfonamides status: Secondary | ICD-10-CM | POA: Diagnosis not present

## 2014-06-03 DIAGNOSIS — Z7952 Long term (current) use of systemic steroids: Secondary | ICD-10-CM | POA: Diagnosis not present

## 2014-06-03 DIAGNOSIS — S72002A Fracture of unspecified part of neck of left femur, initial encounter for closed fracture: Secondary | ICD-10-CM

## 2014-06-03 DIAGNOSIS — R059 Cough, unspecified: Secondary | ICD-10-CM

## 2014-06-03 DIAGNOSIS — Z7951 Long term (current) use of inhaled steroids: Secondary | ICD-10-CM | POA: Diagnosis not present

## 2014-06-03 DIAGNOSIS — R05 Cough: Secondary | ICD-10-CM

## 2014-06-03 LAB — CBC WITH DIFFERENTIAL/PLATELET
Basophils Absolute: 0 10*3/uL (ref 0.0–0.1)
Basophils Relative: 0 % (ref 0–1)
Eosinophils Absolute: 0 10*3/uL (ref 0.0–0.7)
Eosinophils Relative: 0 % (ref 0–5)
HCT: 39.2 % (ref 39.0–52.0)
Hemoglobin: 13.1 g/dL (ref 13.0–17.0)
Lymphocytes Relative: 4 % — ABNORMAL LOW (ref 12–46)
Lymphs Abs: 0.8 10*3/uL (ref 0.7–4.0)
MCH: 27.6 pg (ref 26.0–34.0)
MCHC: 33.4 g/dL (ref 30.0–36.0)
MCV: 82.7 fL (ref 78.0–100.0)
Monocytes Absolute: 1.6 10*3/uL — ABNORMAL HIGH (ref 0.1–1.0)
Monocytes Relative: 8 % (ref 3–12)
Neutro Abs: 18.2 10*3/uL — ABNORMAL HIGH (ref 1.7–7.7)
Neutrophils Relative %: 88 % — ABNORMAL HIGH (ref 43–77)
Platelets: 260 10*3/uL (ref 150–400)
RBC: 4.74 MIL/uL (ref 4.22–5.81)
RDW: 12.6 % (ref 11.5–15.5)
WBC: 20.7 10*3/uL — ABNORMAL HIGH (ref 4.0–10.5)

## 2014-06-03 LAB — BASIC METABOLIC PANEL
Anion gap: 12 (ref 5–15)
BUN: 18 mg/dL (ref 6–23)
CALCIUM: 9.2 mg/dL (ref 8.4–10.5)
CO2: 26 meq/L (ref 19–32)
Chloride: 102 mEq/L (ref 96–112)
Creatinine, Ser: 1.21 mg/dL (ref 0.50–1.35)
GFR calc Af Amer: 62 mL/min — ABNORMAL LOW (ref >90)
GFR calc non Af Amer: 53 mL/min — ABNORMAL LOW (ref >90)
GLUCOSE: 152 mg/dL — AB (ref 70–99)
Potassium: 5.2 mEq/L (ref 3.7–5.3)
Sodium: 140 mEq/L (ref 137–147)

## 2014-06-03 LAB — TYPE AND SCREEN
ABO/RH(D): A POS
Antibody Screen: NEGATIVE

## 2014-06-03 LAB — PROTIME-INR
INR: 1.01 (ref 0.00–1.49)
Prothrombin Time: 13.4 s (ref 11.6–15.2)

## 2014-06-03 MED ORDER — ADULT MULTIVITAMIN W/MINERALS CH
1.0000 | ORAL_TABLET | Freq: Every day | ORAL | Status: DC
  Administered 2014-06-05 – 2014-06-07 (×3): 1 via ORAL
  Filled 2014-06-03 (×4): qty 1

## 2014-06-03 MED ORDER — MORPHINE SULFATE 4 MG/ML IJ SOLN
4.0000 mg | INTRAMUSCULAR | Status: DC | PRN
  Administered 2014-06-03: 4 mg via INTRAVENOUS
  Filled 2014-06-03: qty 1

## 2014-06-03 MED ORDER — PREDNISONE 2.5 MG PO TABS
2.5000 mg | ORAL_TABLET | Freq: Every day | ORAL | Status: DC
  Administered 2014-06-05 – 2014-06-07 (×3): 2.5 mg via ORAL
  Filled 2014-06-03 (×5): qty 1

## 2014-06-03 MED ORDER — ALBUTEROL SULFATE HFA 108 (90 BASE) MCG/ACT IN AERS: 2.0000 | INHALATION_SPRAY | RESPIRATORY_TRACT | Status: DC | PRN

## 2014-06-03 MED ORDER — ZOLPIDEM TARTRATE 5 MG PO TABS: 5.0000 mg | ORAL_TABLET | Freq: Every evening | ORAL | Status: DC | PRN

## 2014-06-03 MED ORDER — DOCUSATE SODIUM 100 MG PO CAPS
100.0000 mg | ORAL_CAPSULE | Freq: Two times a day (BID) | ORAL | Status: DC
  Administered 2014-06-04: 100 mg via ORAL
  Filled 2014-06-03 (×2): qty 1

## 2014-06-03 MED ORDER — ONDANSETRON HCL 4 MG PO TABS: 4.0000 mg | ORAL_TABLET | Freq: Four times a day (QID) | ORAL | Status: DC | PRN

## 2014-06-03 MED ORDER — BUDESONIDE-FORMOTEROL FUMARATE 160-4.5 MCG/ACT IN AERO
2.0000 | INHALATION_SPRAY | Freq: Two times a day (BID) | RESPIRATORY_TRACT | Status: DC
  Administered 2014-06-04 (×2): 2 via RESPIRATORY_TRACT
  Filled 2014-06-03: qty 6

## 2014-06-03 MED ORDER — OXYCODONE HCL 5 MG PO TABS
5.0000 mg | ORAL_TABLET | ORAL | Status: DC | PRN
  Administered 2014-06-04: 5 mg via ORAL
  Filled 2014-06-03: qty 1

## 2014-06-03 MED ORDER — MORPHINE SULFATE 2 MG/ML IJ SOLN: 1.0000 mg | INTRAMUSCULAR | Status: DC | PRN

## 2014-06-03 MED ORDER — TIOTROPIUM BROMIDE MONOHYDRATE 18 MCG IN CAPS
18.0000 ug | ORAL_CAPSULE | Freq: Every day | RESPIRATORY_TRACT | Status: DC
Start: 2014-06-04 — End: 2014-06-07
  Administered 2014-06-04 – 2014-06-07 (×4): 18 ug via RESPIRATORY_TRACT
  Filled 2014-06-03: qty 5

## 2014-06-03 MED ORDER — VITAMIN B-1 100 MG PO TABS
100.0000 mg | ORAL_TABLET | Freq: Every day | ORAL | Status: DC
  Administered 2014-06-05 – 2014-06-07 (×3): 100 mg via ORAL
  Filled 2014-06-03 (×4): qty 1

## 2014-06-03 MED ORDER — ALBUTEROL SULFATE (2.5 MG/3ML) 0.083% IN NEBU
2.5000 mg | INHALATION_SOLUTION | RESPIRATORY_TRACT | Status: DC | PRN
  Administered 2014-06-05: 2.5 mg via RESPIRATORY_TRACT
  Filled 2014-06-03: qty 3

## 2014-06-03 MED ORDER — ACETAMINOPHEN 325 MG PO TABS: 650.0000 mg | ORAL_TABLET | Freq: Four times a day (QID) | ORAL | Status: DC | PRN

## 2014-06-03 MED ORDER — ONDANSETRON HCL 4 MG/2ML IJ SOLN: 4.0000 mg | Freq: Four times a day (QID) | INTRAMUSCULAR | Status: DC | PRN

## 2014-06-03 MED ORDER — SIMVASTATIN 5 MG PO TABS
5.0000 mg | ORAL_TABLET | Freq: Every day | ORAL | Status: DC
  Administered 2014-06-04 – 2014-06-06 (×4): 5 mg via ORAL
  Filled 2014-06-03 (×5): qty 1

## 2014-06-03 MED ORDER — FOLIC ACID 1 MG PO TABS
1.0000 mg | ORAL_TABLET | Freq: Every day | ORAL | Status: DC
  Administered 2014-06-05 – 2014-06-07 (×3): 1 mg via ORAL
  Filled 2014-06-03 (×4): qty 1

## 2014-06-03 MED ORDER — LEVOTHYROXINE SODIUM 50 MCG PO TABS
50.0000 ug | ORAL_TABLET | Freq: Every day | ORAL | Status: DC
  Administered 2014-06-04 – 2014-06-07 (×4): 50 ug via ORAL
  Filled 2014-06-03 (×5): qty 1

## 2014-06-03 MED ORDER — ACETAMINOPHEN 650 MG RE SUPP: 650.0000 mg | Freq: Four times a day (QID) | RECTAL | Status: DC | PRN

## 2014-06-03 MED ORDER — SODIUM CHLORIDE 0.9 % IV SOLN
INTRAVENOUS | Status: DC
  Administered 2014-06-03: via INTRAVENOUS

## 2014-06-03 NOTE — H&P (Addendum)
[facility] History and Physical  [name] CHY:[id number] DOB: 05-Aug-1927 DOA: 06/03/2014  Referring physician: Leata Mouse, MD PCP: [name], MD   Chief Complaint: Fall and Fracture  HPI: [name] is a 80 y.o. male presents with a fractured hip. He states that he was dragging a trash can and slipped and fell. He did not have any dizziness or headaches. He had no chest pain. He was able to stand and get himself into the house. He states that he was not able to bear weight on the side. He does have COPD for last 20 years. Patient states that he does not smoke. He does not have any heart disease. He does not recall passing out. Right now he is comfortable other than pain.   Review of Systems:  Constitutional:  No weight loss, night sweats, Fevers, chills, fatigue.  HEENT:  No headaches, Difficulty swallowing Cardio-vascular:  No chest pain, Orthopnea, PND, swelling in lower extremities, anasarca, dizziness GI:  No heartburn, indigestion, abdominal pain, nausea, vomiting, diarrhea, change in bowel habits Resp:  No shortness of breath with exertion or at rest no productive cough No coughing up of blood.++ wheezing Skin:  no rash or lesions GU:  no dysuria, change in color of urine, no urgency or frequency Musculoskeletal:  No joint pain or swelling. No decreased range of motion Psych:  No change in mood or affect. No depression or anxiety  Past Medical History  Diagnosis Date  . COPD (chronic obstructive pulmonary disease)   . Hyperlipidemia   . Hypothyroidism   . Pulmonary nodule, right   . Tobacco abuse     Quit 09/17/10   Past Surgical History  Procedure Laterality Date  . Tracheostomy      after aspiration event  . Electronavigational bronchoscopy  07/2010   Social History:  reports that he quit smoking about 3 years ago. His smoking use included Cigarettes. He has a 55 pack-year smoking history. He has never used smokeless tobacco. He  reports that he does not drink alcohol. His drug history is not on file.  Allergies  Allergen Reactions  . Sulfonamide Derivatives     unknown    History reviewed. No pertinent family history.   Prior to Admission medications   Medication Sig Start Date End Date Taking? Authorizing Provider  albuterol (VENTOLIN HFA) 108 (90 BASE) MCG/ACT inhaler Inhale 2 puffs into the lungs every 4 (four) hours as needed for shortness of breath.    Yes Historical Provider, MD  budesonide-formoterol (SYMBICORT) 160-4.5 MCG/ACT inhaler Inhale 2 puffs into the lungs 2 (two) times daily. 06/22/13  Yes [name], MD  levothyroxine (SYNTHROID, LEVOTHROID) 50 MCG tablet Take 50 mcg by mouth daily.   Yes Historical Provider, MD  predniSONE (DELTASONE) 5 MG tablet Take 0.5 tablets (2.5 mg total) by mouth daily with breakfast. 01/15/14  Yes [name], MD  simvastatin (ZOCOR) 5 MG tablet Take 5 mg by mouth at bedtime.     Yes Historical Provider, MD  tiotropium (SPIRIVA HANDIHALER) 18 MCG inhalation capsule Place 1 capsule (18 mcg total) into inhaler and inhale daily. 06/22/13  Yes [name], MD   Physical Exam: Filed Vitals:   06/03/14 2100 06/03/14 2131 06/03/14 2133 06/03/14 2230  BP: 169/67 163/63  135/57  Pulse: 98  102 100  Temp:      TempSrc:      Resp: 24     Height:      Weight:      SpO2:  99%  96% 100%    Wt Readings from Last 3 Encounters:  06/03/14 87.091 kg (192 lb)  05/14/14 88.814 kg (195 lb 12.8 oz)  01/15/14 84.823 kg (187 lb)    General:  Appears calm and comfortable Eyes: PERRL, normal lids, irises & conjunctiva ENT: grossly normal hearing, lips & tongue Neck: no LAD, masses or thyromegaly Cardiovascular: RRR, no m/r/g. No LE edema Respiratory: CTA bilaterally, no w/r/r. Normal respiratory effort. Abdomen: soft, ntnd Skin: no rash or induration seen on limited exam Musculoskeletal: grossly normal tone BUE/BLE good pulses bilaterally Psychiatric: grossly normal mood and affect,  speech fluent and appropriate Neurologic: grossly non-focal.          Labs on Admission:  Basic Metabolic Panel:  Recent Labs Lab 06/03/14 2030  NA 140  K 5.2  CL 102  CO2 26  GLUCOSE 152*  BUN 18  CREATININE 1.21  CALCIUM 9.2   Liver Function Tests: No results for input(s): AST, ALT, ALKPHOS, BILITOT, PROT, ALBUMIN in the last 168 hours. No results for input(s): LIPASE, AMYLASE in the last 168 hours. No results for input(s): AMMONIA in the last 168 hours. CBC:  Recent Labs Lab 06/03/14 2030  WBC 20.7*  NEUTROABS 18.2*  HGB 13.1  HCT 39.2  MCV 82.7  PLT 260   Cardiac Enzymes: No results for input(s): CKTOTAL, CKMB, CKMBINDEX, TROPONINI in the last 168 hours.  BNP (last 3 results) No results for input(s): PROBNP in the last 8760 hours. CBG: No results for input(s): GLUCAP in the last 168 hours.  Radiological Exams on Admission: Dg Chest 1 View  06/03/2014   CLINICAL DATA:  Fall.  EXAM: CHEST - 1 VIEW  COMPARISON:  Oct 26, 2013.  FINDINGS: The heart size and mediastinal contours are within normal limits. Both lungs are clear. No pneumothorax or pleural effusion is noted. The visualized skeletal structures are unremarkable.  IMPRESSION: No acute cardiopulmonary abnormality seen.   Electronically Signed   By: Sabino Dick M.D.   On: 06/03/2014 21:33   Dg Hip Complete Left  06/03/2014   CLINICAL DATA:  Fall.  Left hip pain.  EXAM: LEFT HIP - COMPLETE 2+ VIEW  COMPARISON:  None  FINDINGS: Abnormal flat and irregular appearance of the junction of the left femoral head and neck suspicious for subtle fracture.  Spurring of the acetabula.  IMPRESSION: Nondisplaced subcapital fracture of the left femoral neck.   Electronically Signed   By: Sherryl Barters M.D.   On: 06/03/2014 21:33   Dg Femur Left  06/03/2014   CLINICAL DATA:  Fall.  Left hip pain.  EXAM: LEFT FEMUR - 2 VIEW  COMPARISON:  06/03/2014 hip radiographs  FINDINGS: Osteoarthritis of the knee with medial  compartmental articular space narrowing. Vascular calcifications are present. Mild spurring along the articular margin of the patella. No fracture in the distal femur identified; the proximal femur is assessed on the hip radiographs.  IMPRESSION: 1. No mid or distal femoral fracture is evident. Proximal femur assessed on hip radiographs. 2. Osteoarthritis of the knee. 3. Atherosclerosis.   Electronically Signed   By: Sherryl Barters M.D.   On: 06/03/2014 21:32      Assessment/Plan Active Problems:   Closed left hip fracture   COPD (chronic obstructive pulmonary disease)   Hyperlipidemia   Hip fracture   1. Closed hip fracture -will be admitted to med-surgery floor -will get orthopedics consult -pain control  2. COPD -will continue with his home inhalers -may need stress dose  steroids -may need pulmonary consult pre-op  3. Hyperlipidemia -will continue with his home medications    Code Status: Full Code (must indicate code status--if unknown or must be presumed, indicate so) DVT Prophylaxis:SCDs Family Communication:Wife and kids (indicate person spoken with, if applicable, with phone number if by telephone) Disposition Plan: Home (indicate anticipated LOS)  Time spent: 39min  KHAN,SAADAT A [facility] Pager [phone]

## 2014-06-03 NOTE — ED Notes (Signed)
MD at bedside. 

## 2014-06-03 NOTE — ED Notes (Signed)
Pt transported to radiology.

## 2014-06-03 NOTE — ED Provider Notes (Signed)
CSN: [id number]     Arrival date & time 06/03/14  2001 History   First MD Initiated Contact with Patient 06/03/14 2005     Chief Complaint  Patient presents with  . Fall  . Hip Injury     (Consider location/radiation/quality/duration/timing/severity/associated sxs/prior Treatment) Patient is a 80 y.o. male presenting with fall.  Fall Associated symptoms include arthralgias and joint swelling. Pertinent negatives include no fever or neck pain.     80 year old male with a history of severe COPD and emphysema who presents following a mechanical fall. He was going out to get his trash cans, when he slipped on a wet step and fell landing on his left hip. He did not hit his head and did not lose consciousness. He was able to get up and walk a few steps, but had to sit down because he could not bear any weight on his left leg after the first few steps. He presents complaining of 8 out of 10 left hip pain, worse with movement. He says it feels okay if he lays still.  He denies any back pain, neck pain, headache. He denies any pain or tenderness anywhere else. Past Medical History  Diagnosis Date  . COPD (chronic obstructive pulmonary disease)   . Hyperlipidemia   . Hypothyroidism   . Pulmonary nodule, right   . Tobacco abuse     Quit 09/17/10   Past Surgical History  Procedure Laterality Date  . Tracheostomy      after aspiration event  . Electronavigational bronchoscopy  07/2010   History reviewed. No pertinent family history. History  Substance Use Topics  . Smoking status: Former Smoker -- 1.00 packs/day for 55 years    Types: Cigarettes    Quit date: 04/22/2011  . Smokeless tobacco: Never Used     Comment: occasionally smoked 2-3 times a week--1 cig each time  . Alcohol Use: No    Review of Systems  Constitutional: Negative for fever.  Cardiovascular: Negative for palpitations.  Musculoskeletal: Positive for joint swelling, arthralgias and gait problem. Negative for back  pain and neck pain.  All other systems reviewed and are negative.     Allergies  Sulfonamide derivatives  Home Medications   Prior to Admission medications   Medication Sig Start Date End Date Taking? Authorizing Provider  albuterol (VENTOLIN HFA) 108 (90 BASE) MCG/ACT inhaler Inhale 2 puffs into the lungs every 4 (four) hours as needed for shortness of breath.    Yes Historical Provider, MD  budesonide-formoterol (SYMBICORT) 160-4.5 MCG/ACT inhaler Inhale 2 puffs into the lungs 2 (two) times daily. 06/22/13  Yes [name], MD  levothyroxine (SYNTHROID, LEVOTHROID) 50 MCG tablet Take 50 mcg by mouth daily.   Yes Historical Provider, MD  predniSONE (DELTASONE) 5 MG tablet Take 0.5 tablets (2.5 mg total) by mouth daily with breakfast. 01/15/14  Yes [name], MD  simvastatin (ZOCOR) 5 MG tablet Take 5 mg by mouth at bedtime.     Yes Historical Provider, MD  tiotropium (SPIRIVA HANDIHALER) 18 MCG inhalation capsule Place 1 capsule (18 mcg total) into inhaler and inhale daily. 06/22/13  Yes [name], MD   BP 151/72 mmHg  Pulse 103  Temp(Src) 98 F (36.7 C) (Oral)  Resp 24  Ht 5\' 10"  (1.778 m)  Wt 192 lb (87.091 kg)  BMI 27.55 kg/m2  SpO2 97% Physical Exam  Constitutional: He is oriented to person, place, and time. He appears well-developed.  Chronically ill  HENT:  Head: Normocephalic and atraumatic.  Eyes: Conjunctivae are normal. Pupils are equal, round, and reactive to light.  Neck: Normal range of motion. JVD present.  Cardiovascular: Normal rate, regular rhythm and normal heart sounds.   Pulmonary/Chest: He has wheezes.  Prolonged expiration, mildly increased worker breathing  Abdominal: Soft. There is no tenderness.  Musculoskeletal:  Pain upon palpation to the left hip. Pain with range of motion. No shortening or external rotation. Distal circulation sensation and motion intact.  Neurological: He is alert and oriented to person, place, and time.  Skin: Skin is warm and  dry.  Psychiatric: He has a normal mood and affect.  Nursing note and vitals reviewed.   ED Course  Procedures (including critical care time) Labs Review Labs Reviewed  BASIC METABOLIC PANEL - Abnormal; Notable for the following:    Glucose, Bld 152 (*)    GFR calc non Af Amer 53 (*)    GFR calc Af Amer 62 (*)    All other components within normal limits  CBC WITH DIFFERENTIAL - Abnormal; Notable for the following:    WBC 20.7 (*)    Neutrophils Relative % 88 (*)    Neutro Abs 18.2 (*)    Lymphocytes Relative 4 (*)    Monocytes Absolute 1.6 (*)    All other components within normal limits  PROTIME-INR  TYPE AND SCREEN  ABO/RH    Imaging Review Dg Chest 1 View  06/03/2014   CLINICAL DATA:  Fall.  EXAM: CHEST - 1 VIEW  COMPARISON:  Oct 26, 2013.  FINDINGS: The heart size and mediastinal contours are within normal limits. Both lungs are clear. No pneumothorax or pleural effusion is noted. The visualized skeletal structures are unremarkable.  IMPRESSION: No acute cardiopulmonary abnormality seen.   Electronically Signed   By: Sabino Dick M.D.   On: 06/03/2014 21:33   Dg Hip Complete Left  06/03/2014   CLINICAL DATA:  Fall.  Left hip pain.  EXAM: LEFT HIP - COMPLETE 2+ VIEW  COMPARISON:  None  FINDINGS: Abnormal flat and irregular appearance of the junction of the left femoral head and neck suspicious for subtle fracture.  Spurring of the acetabula.  IMPRESSION: Nondisplaced subcapital fracture of the left femoral neck.   Electronically Signed   By: Sherryl Barters M.D.   On: 06/03/2014 21:33   Dg Femur Left  06/03/2014   CLINICAL DATA:  Fall.  Left hip pain.  EXAM: LEFT FEMUR - 2 VIEW  COMPARISON:  06/03/2014 hip radiographs  FINDINGS: Osteoarthritis of the knee with medial compartmental articular space narrowing. Vascular calcifications are present. Mild spurring along the articular margin of the patella. No fracture in the distal femur identified; the proximal femur is assessed on  the hip radiographs.  IMPRESSION: 1. No mid or distal femoral fracture is evident. Proximal femur assessed on hip radiographs. 2. Osteoarthritis of the knee. 3. Atherosclerosis.   Electronically Signed   By: Sherryl Barters M.D.   On: 06/03/2014 21:32     EKG Interpretation None      MDM   Final diagnoses:  Closed left hip fracture, initial encounter   80 year old with COPD presents with left subtrochanteric hip fracture. Patient has pain and tenderness with range of motion of the left hip. X-rays demonstrate fracture of the left femoral neck. No other injuries identified on physical exam. Patient has leukocytosis, likely stress response.   Pain is controlled, consulted orthopedics who requested admission to the hospitalist and plan for evaluation and surgery tomorrow.   Leata Mouse,  MD 06/03/14 2316  Ernestina Patches, MD 06/06/14 [phone]

## 2014-06-03 NOTE — ED Notes (Addendum)
Pt was at home on his front porch and slipped and fell and landed on his left hip. No LOC, no deformity noted. Pt stated that he was "barely able to walk" after fall. Pt wears o2 on 2L at night when at home.

## 2014-06-04 ENCOUNTER — Inpatient Hospital Stay (HOSPITAL_COMMUNITY): Payer: Medicare Other | Admitting: Anesthesiology

## 2014-06-04 ENCOUNTER — Encounter (HOSPITAL_COMMUNITY)
Admission: EM | Disposition: A | Discharge status: Rehabilitation Facility | Payer: Self-pay | Source: Home / Self Care | Attending: Internal Medicine

## 2014-06-04 ENCOUNTER — Encounter (HOSPITAL_COMMUNITY): Payer: Self-pay | Admitting: Pulmonary Disease

## 2014-06-04 ENCOUNTER — Inpatient Hospital Stay (HOSPITAL_COMMUNITY): Payer: Medicare Other

## 2014-06-04 DIAGNOSIS — S72002D Fracture of unspecified part of neck of left femur, subsequent encounter for closed fracture with routine healing: Secondary | ICD-10-CM

## 2014-06-04 DIAGNOSIS — J9611 Chronic respiratory failure with hypoxia: Secondary | ICD-10-CM

## 2014-06-04 DIAGNOSIS — J449 Chronic obstructive pulmonary disease, unspecified: Secondary | ICD-10-CM

## 2014-06-04 DIAGNOSIS — E039 Hypothyroidism, unspecified: Secondary | ICD-10-CM | POA: Diagnosis present

## 2014-06-04 HISTORY — PX: HIP PINNING,CANNULATED: SHX1758

## 2014-06-04 LAB — COMPREHENSIVE METABOLIC PANEL
ALT: 14 U/L (ref 0–53)
ANION GAP: 12 (ref 5–15)
AST: 17 U/L (ref 0–37)
Albumin: 3.3 g/dL — ABNORMAL LOW (ref 3.5–5.2)
Alkaline Phosphatase: 77 U/L (ref 39–117)
BUN: 21 mg/dL (ref 6–23)
CO2: 25 mEq/L (ref 19–32)
CREATININE: 1.19 mg/dL (ref 0.50–1.35)
Calcium: 8.6 mg/dL (ref 8.4–10.5)
Chloride: 103 mEq/L (ref 96–112)
GFR calc Af Amer: 63 mL/min — ABNORMAL LOW (ref >90)
GFR calc non Af Amer: 54 mL/min — ABNORMAL LOW (ref >90)
GLUCOSE: 129 mg/dL — AB (ref 70–99)
Potassium: 4.8 mEq/L (ref 3.7–5.3)
Sodium: 140 mEq/L (ref 137–147)
Total Bilirubin: 0.8 mg/dL (ref 0.3–1.2)
Total Protein: 6.5 g/dL (ref 6.0–8.3)

## 2014-06-04 LAB — CBC
HCT: 40.1 % (ref 39.0–52.0)
HEMOGLOBIN: 13.2 g/dL (ref 13.0–17.0)
MCH: 30.5 pg (ref 26.0–34.0)
MCHC: 32.9 g/dL (ref 30.0–36.0)
MCV: 92.6 fL (ref 78.0–100.0)
Platelets: 211 10*3/uL (ref 150–400)
RBC: 4.33 MIL/uL (ref 4.22–5.81)
RDW: 13.5 % (ref 11.5–15.5)
WBC: 11.6 10*3/uL — ABNORMAL HIGH (ref 4.0–10.5)

## 2014-06-04 LAB — SURGICAL PCR SCREEN
MRSA, PCR: NEGATIVE
Staphylococcus aureus: NEGATIVE

## 2014-06-04 LAB — HEMOGLOBIN A1C
Hgb A1c MFr Bld: 6.5 % — ABNORMAL HIGH (ref <5.7)
Mean Plasma Glucose: 140 mg/dL — ABNORMAL HIGH (ref <117)

## 2014-06-04 LAB — TSH: TSH: 1.22 u[IU]/mL (ref 0.350–4.500)

## 2014-06-04 LAB — ABO/RH: ABO/RH(D): A POS

## 2014-06-04 SURGERY — FIXATION, FEMUR, NECK, PERCUTANEOUS, USING SCREW
Anesthesia: Monitor Anesthesia Care | Site: Hip | Laterality: Left

## 2014-06-04 MED ORDER — PROPOFOL INFUSION 10 MG/ML OPTIME
INTRAVENOUS | Status: DC | PRN
  Administered 2014-06-04: 75 ug/kg/min via INTRAVENOUS

## 2014-06-04 MED ORDER — CEFAZOLIN SODIUM-DEXTROSE 2-3 GM-% IV SOLR
INTRAVENOUS | Status: DC | PRN
  Administered 2014-06-04: 2 g via INTRAVENOUS

## 2014-06-04 MED ORDER — FENTANYL CITRATE 0.05 MG/ML IJ SOLN
INTRAMUSCULAR | Status: AC
  Filled 2014-06-04: qty 5

## 2014-06-04 MED ORDER — HYDROCORTISONE NA SUCCINATE PF 100 MG IJ SOLR
100.0000 mg | Freq: Three times a day (TID) | INTRAMUSCULAR | Status: AC
  Administered 2014-06-04 – 2014-06-05 (×3): 100 mg via INTRAVENOUS
  Filled 2014-06-04 (×3): qty 2

## 2014-06-04 MED ORDER — ONDANSETRON HCL 4 MG/2ML IJ SOLN
4.0000 mg | Freq: Four times a day (QID) | INTRAMUSCULAR | Status: DC | PRN
  Administered 2014-06-05: 4 mg via INTRAVENOUS
  Filled 2014-06-04: qty 2

## 2014-06-04 MED ORDER — ACETAMINOPHEN 325 MG PO TABS: 650.0000 mg | ORAL_TABLET | Freq: Four times a day (QID) | ORAL | Status: DC | PRN

## 2014-06-04 MED ORDER — PROPOFOL 10 MG/ML IV BOLUS
INTRAVENOUS | Status: AC
  Filled 2014-06-04: qty 20

## 2014-06-04 MED ORDER — FENTANYL CITRATE 0.05 MG/ML IJ SOLN
INTRAMUSCULAR | Status: DC | PRN
  Administered 2014-06-04: 50 ug via INTRAVENOUS

## 2014-06-04 MED ORDER — DEXTROSE 5 % IV SOLN
10.0000 mg | INTRAVENOUS | Status: DC | PRN
  Administered 2014-06-04: 20 ug/min via INTRAVENOUS

## 2014-06-04 MED ORDER — BUDESONIDE-FORMOTEROL FUMARATE 160-4.5 MCG/ACT IN AERO
2.0000 | INHALATION_SPRAY | Freq: Two times a day (BID) | RESPIRATORY_TRACT | Status: DC
  Administered 2014-06-05 – 2014-06-07 (×6): 2 via RESPIRATORY_TRACT
  Filled 2014-06-04: qty 6

## 2014-06-04 MED ORDER — PHENOL 1.4 % MT LIQD: 1.0000 | OROMUCOSAL | Status: DC | PRN

## 2014-06-04 MED ORDER — POLYETHYLENE GLYCOL 3350 17 G PO PACK
17.0000 g | PACK | Freq: Every day | ORAL | Status: DC | PRN
  Administered 2014-06-07: 17 g via ORAL
  Filled 2014-06-04: qty 1

## 2014-06-04 MED ORDER — CEFAZOLIN SODIUM 1-5 GM-% IV SOLN
1.0000 g | Freq: Four times a day (QID) | INTRAVENOUS | Status: AC
  Administered 2014-06-04 – 2014-06-05 (×2): 1 g via INTRAVENOUS
  Filled 2014-06-04 (×2): qty 50

## 2014-06-04 MED ORDER — BUPIVACAINE HCL (PF) 0.75 % IJ SOLN
INTRAMUSCULAR | Status: DC | PRN
  Administered 2014-06-04: 12 mg via INTRATHECAL

## 2014-06-04 MED ORDER — SODIUM CHLORIDE 0.9 % IV SOLN
INTRAVENOUS | Status: DC
  Administered 2014-06-04: 22:00:00 via INTRAVENOUS

## 2014-06-04 MED ORDER — HYDROCODONE-ACETAMINOPHEN 5-325 MG PO TABS: 1.0000 | ORAL_TABLET | Freq: Four times a day (QID) | ORAL | Status: DC | PRN

## 2014-06-04 MED ORDER — METOCLOPRAMIDE HCL 5 MG/ML IJ SOLN: 5.0000 mg | Freq: Three times a day (TID) | INTRAMUSCULAR | Status: DC | PRN

## 2014-06-04 MED ORDER — ONDANSETRON HCL 4 MG PO TABS: 4.0000 mg | ORAL_TABLET | Freq: Four times a day (QID) | ORAL | Status: DC | PRN

## 2014-06-04 MED ORDER — MENTHOL 3 MG MT LOZG: 1.0000 | LOZENGE | OROMUCOSAL | Status: DC | PRN

## 2014-06-04 MED ORDER — ALUM & MAG HYDROXIDE-SIMETH 200-200-20 MG/5ML PO SUSP
30.0000 mL | ORAL | Status: DC | PRN
Start: 2014-06-04 — End: 2014-06-07
  Administered 2014-06-05 – 2014-06-06 (×2): 30 mL via ORAL
  Filled 2014-06-04 (×2): qty 30

## 2014-06-04 MED ORDER — FENTANYL CITRATE 0.05 MG/ML IJ SOLN: 25.0000 ug | INTRAMUSCULAR | Status: DC | PRN

## 2014-06-04 MED ORDER — ASPIRIN EC 325 MG PO TBEC: 325.0000 mg | DELAYED_RELEASE_TABLET | Freq: Two times a day (BID) | ORAL | Status: DC

## 2014-06-04 MED ORDER — HYDROCODONE-ACETAMINOPHEN 5-325 MG PO TABS
1.0000 | ORAL_TABLET | Freq: Four times a day (QID) | ORAL | Status: DC | PRN
  Administered 2014-06-04 – 2014-06-07 (×6): 2 via ORAL
  Filled 2014-06-04 (×7): qty 2

## 2014-06-04 MED ORDER — ONDANSETRON HCL 4 MG/2ML IJ SOLN
INTRAMUSCULAR | Status: DC | PRN
  Administered 2014-06-04: 4 mg via INTRAVENOUS

## 2014-06-04 MED ORDER — PROPOFOL 10 MG/ML IV BOLUS
INTRAVENOUS | Status: DC | PRN
  Administered 2014-06-04: 20 mg via INTRAVENOUS

## 2014-06-04 MED ORDER — METOCLOPRAMIDE HCL 10 MG PO TABS: 5.0000 mg | ORAL_TABLET | Freq: Three times a day (TID) | ORAL | Status: DC | PRN

## 2014-06-04 MED ORDER — METHOCARBAMOL 500 MG PO TABS
500.0000 mg | ORAL_TABLET | Freq: Four times a day (QID) | ORAL | Status: DC | PRN
  Administered 2014-06-06: 500 mg via ORAL
  Filled 2014-06-04: qty 1

## 2014-06-04 MED ORDER — ENOXAPARIN SODIUM 40 MG/0.4ML ~~LOC~~ SOLN
40.0000 mg | SUBCUTANEOUS | Status: DC
  Administered 2014-06-05 – 2014-06-07 (×3): 40 mg via SUBCUTANEOUS
  Filled 2014-06-04 (×4): qty 0.4

## 2014-06-04 MED ORDER — 0.9 % SODIUM CHLORIDE (POUR BTL) OPTIME
TOPICAL | Status: DC | PRN
  Administered 2014-06-04: 1000 mL

## 2014-06-04 MED ORDER — METHOCARBAMOL 1000 MG/10ML IJ SOLN
500.0000 mg | Freq: Four times a day (QID) | INTRAMUSCULAR | Status: DC | PRN
  Filled 2014-06-04: qty 5

## 2014-06-04 MED ORDER — ONDANSETRON HCL 4 MG/2ML IJ SOLN
INTRAMUSCULAR | Status: AC
  Filled 2014-06-04: qty 2

## 2014-06-04 MED ORDER — ACETAMINOPHEN 650 MG RE SUPP: 650.0000 mg | Freq: Four times a day (QID) | RECTAL | Status: DC | PRN

## 2014-06-04 MED ORDER — MORPHINE SULFATE 2 MG/ML IJ SOLN: 0.5000 mg | INTRAMUSCULAR | Status: DC | PRN

## 2014-06-04 MED ORDER — DOCUSATE SODIUM 100 MG PO CAPS
100.0000 mg | ORAL_CAPSULE | Freq: Two times a day (BID) | ORAL | Status: DC
  Administered 2014-06-04 – 2014-06-05 (×2): 100 mg via ORAL
  Filled 2014-06-04 (×2): qty 1

## 2014-06-04 MED ORDER — LACTATED RINGERS IV SOLN
INTRAVENOUS | Status: DC | PRN
  Administered 2014-06-04: 17:00:00 via INTRAVENOUS

## 2014-06-04 SURGICAL SUPPLY — 45 items
BIT DRILL 4.8X200 CANN (BIT) ×2 IMPLANT
COVER PERINEAL POST (MISCELLANEOUS) ×2 IMPLANT
COVER SURGICAL LIGHT HANDLE (MISCELLANEOUS) ×2 IMPLANT
DRAPE STERI IOBAN 125X83 (DRAPES) ×2 IMPLANT
DRAPE SURG 17X23 STRL (DRAPES) ×4 IMPLANT
DRSG ADAPTIC 3X8 NADH LF (GAUZE/BANDAGES/DRESSINGS) ×2 IMPLANT
DRSG MEPILEX BORDER 4X4 (GAUZE/BANDAGES/DRESSINGS) ×2 IMPLANT
DURAPREP 26ML APPLICATOR (WOUND CARE) ×2 IMPLANT
ELECT REM PT RETURN 9FT ADLT (ELECTROSURGICAL) ×2
ELECTRODE REM PT RTRN 9FT ADLT (ELECTROSURGICAL) ×1 IMPLANT
FACESHIELD WRAPAROUND (MASK) IMPLANT
GLOVE BIOGEL PI IND STRL 7.5 (GLOVE) ×1 IMPLANT
GLOVE BIOGEL PI IND STRL 8 (GLOVE) ×1 IMPLANT
GLOVE BIOGEL PI INDICATOR 7.5 (GLOVE) ×1
GLOVE BIOGEL PI INDICATOR 8 (GLOVE) ×1
GLOVE ORTHO TXT STRL SZ7.5 (GLOVE) ×2 IMPLANT
GOWN STRL REUS W/ TWL LRG LVL3 (GOWN DISPOSABLE) ×3 IMPLANT
GOWN STRL REUS W/TWL LRG LVL3 (GOWN DISPOSABLE) ×3
KIT BASIN OR (CUSTOM PROCEDURE TRAY) ×2 IMPLANT
KIT ROOM TURNOVER OR (KITS) ×2 IMPLANT
LINER BOOT UNIVERSAL DISP (MISCELLANEOUS) ×2 IMPLANT
LIQUID BAND (GAUZE/BANDAGES/DRESSINGS) ×2 IMPLANT
MANIFOLD NEPTUNE II (INSTRUMENTS) ×2 IMPLANT
NS IRRIG 1000ML POUR BTL (IV SOLUTION) ×2 IMPLANT
PACK GENERAL/GYN (CUSTOM PROCEDURE TRAY) ×2 IMPLANT
PAD ARMBOARD 7.5X6 YLW CONV (MISCELLANEOUS) ×4 IMPLANT
PIN GUIDE DRILL TIP 2.8X300 (DRILL) ×8 IMPLANT
SCREW 16MM THREAD 6.5X85MM (Screw) ×4 IMPLANT
SCREW CANN 6.5X90 (Screw) ×1 IMPLANT
SCREW CANN 6.5X90 16MM THD (Screw) ×1 IMPLANT
SCREW CANN 90X16X6.5 NS (Screw) ×1 IMPLANT
SCREW CANNULATED 6.5X90MM (Screw) ×2 IMPLANT
SCREW CANNULATED 6.5X95MM (Screw) ×2 IMPLANT
STAPLER VISISTAT 35W (STAPLE) ×2 IMPLANT
SUT MNCRL AB 4-0 PS2 18 (SUTURE) ×2 IMPLANT
SUT MON AB 2-0 CT1 36 (SUTURE) ×2 IMPLANT
SUT VIC AB 0 CT1 27 (SUTURE) ×1
SUT VIC AB 0 CT1 27XBRD ANBCTR (SUTURE) ×1 IMPLANT
SUT VIC AB 1 CT1 27 (SUTURE) ×1
SUT VIC AB 1 CT1 27XBRD ANBCTR (SUTURE) ×1 IMPLANT
SUT VIC AB 2-0 CT1 27 (SUTURE) ×1
SUT VIC AB 2-0 CT1 TAPERPNT 27 (SUTURE) ×1 IMPLANT
TOWEL OR 17X24 6PK STRL BLUE (TOWEL DISPOSABLE) ×2 IMPLANT
TOWEL OR 17X26 10 PK STRL BLUE (TOWEL DISPOSABLE) ×2 IMPLANT
WATER STERILE IRR 1000ML POUR (IV SOLUTION) ×2 IMPLANT

## 2014-06-04 NOTE — Brief Op Note (Signed)
06/03/2014 - 06/04/2014  6:49 PM  PATIENT:  [name]  80 y.o. male  PRE-OPERATIVE DIAGNOSIS:  Left Femoral Neck Fracture, non-displaced  POST-OPERATIVE DIAGNOSIS:  Left Femoral Neck Fracture, non-displaced  PROCEDURE:  Procedure(s): LEFT HIP closed REDUCTION WITH PERCUTANEOUS SCREWS (Left), Biomet  SURGEON:  Surgeon(s) and Role:    * Mauri Pole, MD - Primary  PHYSICIAN ASSISTANT: None  ANESTHESIA:   spinal  EBL:  Total I/O In: -  Out: 40 [Blood:40]  BLOOD ADMINISTERED:none  DRAINS: none   LOCAL MEDICATIONS USED:  NONE  SPECIMEN:  No Specimen  DISPOSITION OF SPECIMEN:  N/A  COUNTS:  YES  TOURNIQUET:  * No tourniquets in log *  DICTATION: .Other Dictation: Dictation Number [id number]  PLAN OF CARE: Admit to inpatient   PATIENT DISPOSITION:  PACU - hemodynamically stable.   Delay start of Pharmacological VTE agent (>24hrs) due to surgical blood loss or risk of bleeding: no

## 2014-06-04 NOTE — Anesthesia Postprocedure Evaluation (Signed)
  Anesthesia Post-op Note  Patient: [name]  Procedure(s) Performed: Procedure(s): LEFT HIP REDUCTION WITH PERCUTANEOUS SCREWS (Left)  Patient Location: PACU  Anesthesia Type:Spinal  Level of Consciousness: awake and alert   Airway and Oxygen Therapy: Patient Spontanous Breathing  Post-op Pain: none  Post-op Assessment: Post-op Vital signs reviewed. Pt moving bilateral feet.  Post-op Vital Signs: Reviewed  Last Vitals:  Filed Vitals:   06/04/14 1955  BP: 133/52  Pulse: 66  Temp:   Resp: 36    Complications: No apparent anesthesia complications

## 2014-06-04 NOTE — Progress Notes (Addendum)
Current and old Chart reviewed.   [facility] PROGRESS NOTE  COAL [place] ZOX:[id number] DOB: 07/13/1927 DOA: 06/03/2014 PCP: [name], MD  Assessment/Plan:  Principal Problem:   Closed left hip fracture:  Medically optimized for surgery.  At risk for pulmonary complications due to severe COPD, but does not appear to have exacerbation at this time.  Active Problems:   Chronic respiratory failure with hypoxia: on home O2, mainly at night    COPD (chronic obstructive pulmonary disease): no wheeze. sats ok.  C/o dyspnea worse than baseline.  Continue home meds. Have consulted Dr. Elsworth Soho to follow perioperatively.  PFT 07/14/06 >> FEV1 1.41(51%), FEV1% 60, TLC 7.3(123%), DLCO 75%, no BD  PFT 07/29/10 >> FEV1 1.30(50%), FEV1% 50, TLC 6.39(100%), DLCO 68%, no BD Spirometry 11/17/10 >> FEV1 1.15(36%), FEV1% 56 ONO with RA 11/30/12 >> Test time 9 hrs 30 min. Mean SpO2 92.8%, low SpO2 86%. Spent 3 min with SpO2 < 88% Spirometry 10/26/13 >> FEV1 0.92 (30%), FEV1% 39 ONO with 2 liters 01/16/14 >> test time 8 hrs 36 min. Basal SpO2 93%, low SpO2 85%. Spent 4 min with SpO2 < 88%.  Hyperlipidemia    Hypothyroidism: continue synthroid  Chronic steroids:  Only on prednisone 2.5 mg, so no need for stress dose steroids  Code Status:  full Family Communication:   Disposition Plan:    Consultants:  ortho  Procedures:     Antibiotics:    HPI/Subjective: Some pain with movement. No cough, wheeze. Does feel dyspneic with minimal exertion  Objective: Filed Vitals:   06/04/14 0544  BP: 122/54  Pulse: 90  Temp: 99.2 F (37.3 C)  Resp:     Intake/Output Summary (Last 24 hours) at 06/04/14 0950 Last data filed at 06/04/14 0600  Gross per 24 hour  Intake    300 ml  Output    150 ml  Net    150 ml   Filed Weights   06/03/14 2010  Weight: 87.091 kg (192 lb)    Exam:   General:  Comfortable on nasal cannula O2  Cardiovascular: RRR without MGR  Respiratory:  diminished throughout without WRR. Breathing nonlabored  Abdomen: S, NT, ND  Ext: no CCE  Basic Metabolic Panel:  Recent Labs Lab 06/03/14 2030 06/04/14 0444  NA 140 140  K 5.2 4.8  CL 102 103  CO2 26 25  GLUCOSE 152* 129*  BUN 18 21  CREATININE 1.21 1.19  CALCIUM 9.2 8.6   Liver Function Tests:  Recent Labs Lab 06/04/14 0444  AST 17  ALT 14  ALKPHOS 77  BILITOT 0.8  PROT 6.5  ALBUMIN 3.3*   No results for input(s): LIPASE, AMYLASE in the last 168 hours. No results for input(s): AMMONIA in the last 168 hours. CBC:  Recent Labs Lab 06/03/14 2030 06/04/14 0854  WBC 20.7* 11.6*  NEUTROABS 18.2*  --   HGB 13.1 13.2  HCT 39.2 40.1  MCV 82.7 92.6  PLT 260 211   Cardiac Enzymes: No results for input(s): CKTOTAL, CKMB, CKMBINDEX, TROPONINI in the last 168 hours. BNP (last 3 results) No results for input(s): PROBNP in the last 8760 hours. CBG: No results for input(s): GLUCAP in the last 168 hours.  No results found for this or any previous visit (from the past 240 hour(s)).   Studies: Dg Chest 1 View  06/03/2014   CLINICAL DATA:  Fall.  EXAM: CHEST - 1 VIEW  COMPARISON:  Oct 26, 2013.  FINDINGS: The heart size and mediastinal  contours are within normal limits. Both lungs are clear. No pneumothorax or pleural effusion is noted. The visualized skeletal structures are unremarkable.  IMPRESSION: No acute cardiopulmonary abnormality seen.   Electronically Signed   By: Sabino Dick M.D.   On: 06/03/2014 21:33   Dg Hip Complete Left  06/03/2014   CLINICAL DATA:  Fall.  Left hip pain.  EXAM: LEFT HIP - COMPLETE 2+ VIEW  COMPARISON:  None  FINDINGS: Abnormal flat and irregular appearance of the junction of the left femoral head and neck suspicious for subtle fracture.  Spurring of the acetabula.  IMPRESSION: Nondisplaced subcapital fracture of the left femoral neck.   Electronically Signed   By: Sherryl Barters M.D.   On: 06/03/2014 21:33   Dg Femur Left  06/03/2014    CLINICAL DATA:  Fall.  Left hip pain.  EXAM: LEFT FEMUR - 2 VIEW  COMPARISON:  06/03/2014 hip radiographs  FINDINGS: Osteoarthritis of the knee with medial compartmental articular space narrowing. Vascular calcifications are present. Mild spurring along the articular margin of the patella. No fracture in the distal femur identified; the proximal femur is assessed on the hip radiographs.  IMPRESSION: 1. No mid or distal femoral fracture is evident. Proximal femur assessed on hip radiographs. 2. Osteoarthritis of the knee. 3. Atherosclerosis.   Electronically Signed   By: Sherryl Barters M.D.   On: 06/03/2014 21:32    Scheduled Meds: . budesonide-formoterol  2 puff Inhalation BID  . docusate sodium  100 mg Oral BID  . folic acid  1 mg Oral Daily  . levothyroxine  50 mcg Oral QAC breakfast  . multivitamin with minerals  1 tablet Oral Daily  . predniSONE  2.5 mg Oral Q breakfast  . simvastatin  5 mg Oral QHS  . thiamine  100 mg Oral Daily  . tiotropium  18 mcg Inhalation Daily   Continuous Infusions: . sodium chloride 50 mL/hr at 06/03/14 2355    Time spent: 35 minutes  [facility] Text page [email or web address], password [facility] 06/04/2014, 9:50 AM  LOS: 1 day

## 2014-06-04 NOTE — Anesthesia Procedure Notes (Signed)
Spinal Patient location during procedure: OR Start time: 06/04/2014 6:50 PM End time: 06/04/2014 6:56 PM Staffing Anesthesiologist: Roderic Palau E Performed by: anesthesiologist  Preanesthetic Checklist Completed: patient identified, surgical consent, pre-op evaluation, timeout performed, IV checked, risks and benefits discussed and monitors and equipment checked Spinal Block Patient position: sitting Prep: Betadine Patient monitoring: cardiac monitor, continuous pulse ox and blood pressure Approach: midline Location: L4-5 Injection technique: single-shot Needle Needle type: Pencan  Needle gauge: 24 G Needle length: 9 cm Assessment Sensory level: T10 Additional Notes Functioning IV was confirmed and monitors were applied. Sterile prep and drape, including hand hygiene and sterile gloves were used. The patient was positioned and the spine was prepped. The skin was anesthetized with lidocaine.  Free flow of clear CSF was obtained prior to injecting local anesthetic into the CSF.  The spinal needle aspirated freely following injection.  The needle was carefully withdrawn.  The patient tolerated the procedure well.

## 2014-06-04 NOTE — Consult Note (Signed)
Name: [name] MRN: [id number] DOB: Mar 04, 1928    ADMISSION DATE:  06/03/2014 CONSULTATION DATE:  06/04/14  REFERRING MD :  Orthopedics  CHIEF COMPLAINT:  Pulmonary clearance, COPD  BRIEF PATIENT DESCRIPTION: 80 y/o M, former smoker (quit in 2012), with a PMH of severe O2 dependent COPD admitted 12/14 after mechanical fall with left hip fracture.  PCCM consulted for pre-op pulmonary evaluation.  SIGNIFICANT EVENTS  12/14  Admit after mechanical fall with left hip fracture  STUDIES:  12/14  CXR >> no acute abnormality 12/14  HIP / Femur >> non-displaced subcapital fx of L femoral neck, no femur fx noted    HISTORY OF PRESENT ILLNESS:  80 y/o M, former smoker (quit in 2012),  with a past medical history of hypothyroidism, hyperlipidemia, known right pulmonary nodule, severe COPD on 2 L O2 ( FEV1 1.30(50%), FEV1% 50, TLC 6.39(100%), DLCO 68%, no BD in 2012), and prior tracheostomy (after an aspiration event at age 21) who was admitted on 12/14 after a mechanical fall.  The patient was taking out his trash when he slipped on a wet step and fell landing on his hip. He reported he was able to stand after the incident but unable to bear weight after a few steps due to pain.  ER evaluation with plain films of the left hip and femur noted a nondisplaced sub-capital fracture of the left femoral neck, no femur fracture noted.   Patient was admitted for pending orthopedic intervention. PCCM consulted for evaluation in the setting of severe COPD and preop clearance.  Currently the patient denies shortness of breath, cough, sputum production, worsening dyspnea on exertion, chest pain/tightness and wheezing.    PAST MEDICAL HISTORY :   has a past medical history of COPD (chronic obstructive pulmonary disease); Hyperlipidemia; Hypothyroidism; Pulmonary nodule, right; and Tobacco abuse.  has past surgical history that includes Tracheostomy and electronavigational bronchoscopy (07/2010).   HOME  MEDICATIONS:  Prior to Admission medications   Medication Sig Start Date End Date Taking? Authorizing Provider  albuterol (VENTOLIN HFA) 108 (90 BASE) MCG/ACT inhaler Inhale 2 puffs into the lungs every 4 (four) hours as needed for shortness of breath.    Yes Historical Provider, MD  budesonide-formoterol (SYMBICORT) 160-4.5 MCG/ACT inhaler Inhale 2 puffs into the lungs 2 (two) times daily. 06/22/13  Yes [name], MD  levothyroxine (SYNTHROID, LEVOTHROID) 50 MCG tablet Take 50 mcg by mouth daily.   Yes Historical Provider, MD  predniSONE (DELTASONE) 5 MG tablet Take 0.5 tablets (2.5 mg total) by mouth daily with breakfast. 01/15/14  Yes [name], MD  simvastatin (ZOCOR) 5 MG tablet Take 5 mg by mouth at bedtime.     Yes Historical Provider, MD  tiotropium (SPIRIVA HANDIHALER) 18 MCG inhalation capsule Place 1 capsule (18 mcg total) into inhaler and inhale daily. 06/22/13  Yes [name], MD   Allergies  Allergen Reactions  . Sulfonamide Derivatives     unknown    FAMILY HISTORY:  family history is not on file.   SOCIAL HISTORY:  reports that he quit smoking about 3 years ago. His smoking use included Cigarettes. He has a 55 pack-year smoking history. He has never used smokeless tobacco. He reports that he does not drink alcohol.  REVIEW OF SYSTEMS:   Constitutional: Negative for fever, chills, weight loss, malaise/fatigue and diaphoresis.  HENT: Negative for hearing loss, ear pain, nosebleeds, congestion, sore throat, neck pain, tinnitus and ear discharge.   Eyes: Negative for blurred vision, double vision,  photophobia, pain, discharge and redness.  Respiratory: Negative for cough, hemoptysis, sputum production, shortness of breath, wheezing and stridor.  No change in baseline dyspnea.  Cardiovascular: Negative for chest pain, palpitations, orthopnea, claudication, leg swelling and PND.  Gastrointestinal: Negative for heartburn, nausea, vomiting, abdominal pain, diarrhea, constipation,  blood in stool and melena.  Genitourinary: Negative for dysuria, urgency, frequency, hematuria and flank pain.  Musculoskeletal: Negative for myalgias, back pain, joint pain and falls.  Skin: Negative for itching and rash.  Neurological: Negative for dizziness, tingling, tremors, sensory change, speech change, focal weakness, seizures, loss of consciousness, weakness and headaches.  Endo/Heme/Allergies: Negative for environmental allergies and polydipsia. Does not bruise/bleed easily.  SUBJECTIVE:   VITAL SIGNS: Temp:  [98 F (36.7 C)-99.2 F (37.3 C)] 99.2 F (37.3 C) (12/15 0544) Pulse Rate:  [90-103] 90 (12/15 0544) Resp:  [24-30] 24 (12/14 2100) BP: (122-171)/(54-74) 122/54 mmHg (12/15 0544) SpO2:  [95 %-100 %] 96 % (12/15 0544) Weight:  [192 lb (87.091 kg)] 192 lb (87.091 kg) (12/14 2010)  PHYSICAL EXAMINATION: General:  WDWN elderly male in no acute distress Neuro:  AAO 4, speech clear, left lower extremity decreased mobility due to fracture HEENT:  MM pink/moist, no JVD Cardiovascular:  s1s2 rrr, no m Lungs:  Even/nonlabored, lungs bilaterally distant, soft isolated wheeze on the right anterior chest Abdomen:  Nontender, nondistended, BS 4 active Musculoskeletal:  No obvious deformity, decreased ROM, decreased strength, tenderness to palpation/movement Skin:  Warm and dry, no edema   Recent Labs Lab 06/03/14 2030 06/04/14 0444  NA 140 140  K 5.2 4.8  CL 102 103  CO2 26 25  BUN 18 21  CREATININE 1.21 1.19  GLUCOSE 152* 129*    Recent Labs Lab 06/03/14 2030 06/04/14 0854  HGB 13.1 13.2  HCT 39.2 40.1  WBC 20.7* 11.6*  PLT 260 211   Dg Chest 1 View  06/03/2014   CLINICAL DATA:  Fall.  EXAM: CHEST - 1 VIEW  COMPARISON:  Oct 26, 2013.  FINDINGS: The heart size and mediastinal contours are within normal limits. Both lungs are clear. No pneumothorax or pleural effusion is noted. The visualized skeletal structures are unremarkable.  IMPRESSION: No acute  cardiopulmonary abnormality seen.   Electronically Signed   By: Sabino Dick M.D.   On: 06/03/2014 21:33   Dg Hip Complete Left  06/03/2014   CLINICAL DATA:  Fall.  Left hip pain.  EXAM: LEFT HIP - COMPLETE 2+ VIEW  COMPARISON:  None  FINDINGS: Abnormal flat and irregular appearance of the junction of the left femoral head and neck suspicious for subtle fracture.  Spurring of the acetabula.  IMPRESSION: Nondisplaced subcapital fracture of the left femoral neck.   Electronically Signed   By: Sherryl Barters M.D.   On: 06/03/2014 21:33   Dg Femur Left  06/03/2014   CLINICAL DATA:  Fall.  Left hip pain.  EXAM: LEFT FEMUR - 2 VIEW  COMPARISON:  06/03/2014 hip radiographs  FINDINGS: Osteoarthritis of the knee with medial compartmental articular space narrowing. Vascular calcifications are present. Mild spurring along the articular margin of the patella. No fracture in the distal femur identified; the proximal femur is assessed on the hip radiographs.  IMPRESSION: 1. No mid or distal femoral fracture is evident. Proximal femur assessed on hip radiographs. 2. Osteoarthritis of the knee. 3. Atherosclerosis.   Electronically Signed   By: Sherryl Barters M.D.   On: 06/03/2014 21:32    ASSESSMENT / PLAN:   Chronic respiratory  failure with hypoxemia - baseline home O2 2 L/[place] Severe O2 dependent COPD - baseline 2.5 mg pred dependent, Symbicort + Spiriva with rescue albuterol  Plan: Patient is high risk for general anesthesia.  Agree with plan for potential block for hip reduction.   Continue current pulmonary regimen:  Symbicort, spiriva, 2.5mg  pred + PRN albuterol neb O2 at 2L, titrate for sats 88-94% Pulmonary hygiene:  IS, mobilize as able May need stress steroids given prednisone dependence Minimize sedating medications as able  Stool softener with risk of constipation in setting of narcs  Noe Gens, NP-C [facility] Pgr: [phone] or [phone]  ATTENDING NOTE: I have  personally reviewed patient's available data, including medical history, events of note, physical examination and test results as part of my evaluation. I have discussed with resident/NP and other careteam providers such as pharmacist, RN and RRT & co-ordinated with consultants. In addition, I personally evaluated patient and elicited key history of ex smoker with COPD on noct o2 with accidental fall & hip fracture, exam findings of decreased BS BL, s1s2 nml , tstomy scar& labs showing leucocytosis. CXR - hyperinflation Cleared for surgery from pulmonary standpoint. Hydrocortisone 100 mg q 8h x 3 doses. Prefer spinal anesthesia but OK to use general if absolutely needed Rest per NP/medical resident whose note is outlined above and that I agree with and edited in full.   Rigoberto Noel MD 06/04/2014, 10:29 AM

## 2014-06-04 NOTE — Transfer of Care (Signed)
Immediate Anesthesia Transfer of Care Note  Patient: [name]  Procedure(s) Performed: Procedure(s): LEFT HIP REDUCTION WITH PERCUTANEOUS SCREWS (Left)  Patient Location: PACU  Anesthesia Type:MAC and Spinal  Level of Consciousness: awake, alert , oriented and patient cooperative  Airway & Oxygen Therapy: Patient Spontanous Breathing and Patient connected to nasal cannula oxygen  Post-op Assessment: Report given to PACU RN and Post -op Vital signs reviewed and stable  Post vital signs: Reviewed and stable  Complications: No apparent anesthesia complications

## 2014-06-04 NOTE — Anesthesia Preprocedure Evaluation (Signed)
Anesthesia Evaluation  Patient identified by MRN, date of birth, ID band Patient awake    Reviewed: Allergy & Precautions, H&P , NPO status , Patient's Chart, lab work & pertinent test results  Airway Mallampati: II  TM Distance: >3 FB Neck ROM: Full    Dental no notable dental hx. (+) Teeth Intact, Dental Advisory Given   Pulmonary COPD COPD inhaler and oxygen dependent, former smoker,  breath sounds clear to auscultation  Pulmonary exam normal       Cardiovascular negative cardio ROS  Rhythm:Regular Rate:Normal     Neuro/Psych negative neurological ROS  negative psych ROS   GI/Hepatic negative GI ROS, Neg liver ROS,   Endo/Other  Hypothyroidism   Renal/GU negative Renal ROS  negative genitourinary   Musculoskeletal   Abdominal   Peds  Hematology negative hematology ROS (+)   Anesthesia Other Findings   Reproductive/Obstetrics negative OB ROS                             Anesthesia Physical Anesthesia Plan  ASA: III  Anesthesia Plan: MAC and Spinal   Post-op Pain Management:    Induction: Intravenous  Airway Management Planned: Simple Face Mask  Additional Equipment:   Intra-op Plan:   Post-operative Plan:   Informed Consent: I have reviewed the patients History and Physical, chart, labs and discussed the procedure including the risks, benefits and alternatives for the proposed anesthesia with the patient or authorized representative who has indicated his/her understanding and acceptance.   Dental advisory given  Plan Discussed with: CRNA  Anesthesia Plan Comments:         Anesthesia Quick Evaluation

## 2014-06-04 NOTE — Consult Note (Signed)
Reason for Consult:  Subcapital fracture of the left femoral neck Referring Physician: ED physician  [name] is an 80 y.o. male.  HPI:     80 year old male with a history of severe COPD and emphysema who presents following a mechanical fall. He was going out to get his trash cans, when he slipped on a wet step and fell landing on his left hip. He did not hit his head and did not lose consciousness. He was able to get up and walk a few steps, but had to sit down because he could not bear any weight on his left leg after the first few steps. He presents complaining of 8 out of 10 left hip pain, worse with movement. He says it feels okay if he lays still.  Ortho was consulted.  Past Medical History  Diagnosis Date  . COPD (chronic obstructive pulmonary disease)   . Hyperlipidemia   . Hypothyroidism   . Pulmonary nodule, right   . Tobacco abuse     Quit 09/17/10    Past Surgical History  Procedure Laterality Date  . Tracheostomy      after aspiration event  . Electronavigational bronchoscopy  07/2010    History reviewed. No pertinent family history.  Social History:  reports that he quit smoking about 3 years ago. His smoking use included Cigarettes. He has a 55 pack-year smoking history. He has never used smokeless tobacco. He reports that he does not drink alcohol. His drug history is not on file.  Allergies:  Allergies  Allergen Reactions  . Sulfonamide Derivatives     unknown     Results for orders placed or performed during the hospital encounter of 06/03/14 (from the past 48 hour(s))  Basic metabolic panel     Status: Abnormal   Collection Time: 06/03/14  8:30 PM  Result Value Ref Range   Sodium 140 137 - 147 mEq/L   Potassium 5.2 3.7 - 5.3 mEq/L   Chloride 102 96 - 112 mEq/L   CO2 26 19 - 32 mEq/L   Glucose, Bld 152 (H) 70 - 99 mg/dL   BUN 18 6 - 23 mg/dL   Creatinine, Ser 1.21 0.50 - 1.35 mg/dL   Calcium 9.2 8.4 - 10.5 mg/dL   GFR calc non Af Amer 53 (L) >90  mL/min   GFR calc Af Amer 62 (L) >90 mL/min    Comment: (NOTE) The eGFR has been calculated using the CKD EPI equation. This calculation has not been validated in all clinical situations. eGFR's persistently <90 mL/min signify possible Chronic Kidney Disease.    Anion gap 12 5 - 15  CBC WITH DIFFERENTIAL     Status: Abnormal   Collection Time: 06/03/14  8:30 PM  Result Value Ref Range   WBC 20.7 (H) 4.0 - 10.5 K/uL   RBC 4.74 4.22 - 5.81 MIL/uL   Hemoglobin 13.1 13.0 - 17.0 g/dL   HCT 39.2 39.0 - 52.0 %   MCV 82.7 78.0 - 100.0 fL   MCH 27.6 26.0 - 34.0 pg   MCHC 33.4 30.0 - 36.0 g/dL   RDW 12.6 11.5 - 15.5 %   Platelets 260 150 - 400 K/uL   Neutrophils Relative % 88 (H) 43 - 77 %   Neutro Abs 18.2 (H) 1.7 - 7.7 K/uL   Lymphocytes Relative 4 (L) 12 - 46 %   Lymphs Abs 0.8 0.7 - 4.0 K/uL   Monocytes Relative 8 3 - 12 %   Monocytes  Absolute 1.6 (H) 0.1 - 1.0 K/uL   Eosinophils Relative 0 0 - 5 %   Eosinophils Absolute 0.0 0.0 - 0.7 K/uL   Basophils Relative 0 0 - 1 %   Basophils Absolute 0.0 0.0 - 0.1 K/uL  Protime-INR     Status: None   Collection Time: 06/03/14  8:30 PM  Result Value Ref Range   Prothrombin Time 13.4 11.6 - 15.2 seconds   INR 1.01 0.00 - 1.49  Type and screen     Status: None   Collection Time: 06/03/14  8:30 PM  Result Value Ref Range   ABO/RH(D) A POS    Antibody Screen NEG    Sample Expiration 06/06/2014   ABO/Rh     Status: None   Collection Time: 06/03/14  8:30 PM  Result Value Ref Range   ABO/RH(D) A POS   Comprehensive metabolic panel     Status: Abnormal   Collection Time: 06/04/14  4:44 AM  Result Value Ref Range   Sodium 140 137 - 147 mEq/L   Potassium 4.8 3.7 - 5.3 mEq/L   Chloride 103 96 - 112 mEq/L   CO2 25 19 - 32 mEq/L   Glucose, Bld 129 (H) 70 - 99 mg/dL   BUN 21 6 - 23 mg/dL   Creatinine, Ser 1.19 0.50 - 1.35 mg/dL   Calcium 8.6 8.4 - 10.5 mg/dL   Total Protein 6.5 6.0 - 8.3 g/dL   Albumin 3.3 (L) 3.5 - 5.2 g/dL   AST 17 0 -  37 U/L   ALT 14 0 - 53 U/L   Alkaline Phosphatase 77 39 - 117 U/L   Total Bilirubin 0.8 0.3 - 1.2 mg/dL   GFR calc non Af Amer 54 (L) >90 mL/min   GFR calc Af Amer 63 (L) >90 mL/min    Comment: (NOTE) The eGFR has been calculated using the CKD EPI equation. This calculation has not been validated in all clinical situations. eGFR's persistently <90 mL/min signify possible Chronic Kidney Disease.    Anion gap 12 5 - 15  TSH     Status: None   Collection Time: 06/04/14  4:44 AM  Result Value Ref Range   TSH 1.220 0.350 - 4.500 uIU/mL    Dg Chest 1 View  06/03/2014   CLINICAL DATA:  Fall.  EXAM: CHEST - 1 VIEW  COMPARISON:  Oct 26, 2013.  FINDINGS: The heart size and mediastinal contours are within normal limits. Both lungs are clear. No pneumothorax or pleural effusion is noted. The visualized skeletal structures are unremarkable.  IMPRESSION: No acute cardiopulmonary abnormality seen.   Electronically Signed   By: Sabino Dick M.D.   On: 06/03/2014 21:33   Dg Hip Complete Left  06/03/2014   CLINICAL DATA:  Fall.  Left hip pain.  EXAM: LEFT HIP - COMPLETE 2+ VIEW  COMPARISON:  None  FINDINGS: Abnormal flat and irregular appearance of the junction of the left femoral head and neck suspicious for subtle fracture.  Spurring of the acetabula.  IMPRESSION: Nondisplaced subcapital fracture of the left femoral neck.   Electronically Signed   By: Sherryl Barters M.D.   On: 06/03/2014 21:33   Dg Femur Left  06/03/2014   CLINICAL DATA:  Fall.  Left hip pain.  EXAM: LEFT FEMUR - 2 VIEW  COMPARISON:  06/03/2014 hip radiographs  FINDINGS: Osteoarthritis of the knee with medial compartmental articular space narrowing. Vascular calcifications are present. Mild spurring along the articular margin of the patella.  No fracture in the distal femur identified; the proximal femur is assessed on the hip radiographs.  IMPRESSION: 1. No mid or distal femoral fracture is evident. Proximal femur assessed on hip  radiographs. 2. Osteoarthritis of the knee. 3. Atherosclerosis.   Electronically Signed   By: Sherryl Barters M.D.   On: 06/03/2014 21:32    Review of Systems  Constitutional: Negative.   HENT: Negative.   Eyes: Negative.   Respiratory: Positive for wheezing.   Cardiovascular: Negative.   Gastrointestinal: Negative.   Genitourinary: Negative.   Musculoskeletal: Positive for joint pain.  Skin: Negative.   Neurological: Negative.   Endo/Heme/Allergies: Negative.   Psychiatric/Behavioral: Negative.    Blood pressure 122/54, pulse 90, temperature 99.2 F (37.3 C), temperature source Oral, resp. rate 24, height '5\' 10"'  (1.778 m), weight 87.091 kg (192 lb), SpO2 96 %. Physical Exam  Constitutional: He appears well-developed and well-nourished.  HENT:  Head: Normocephalic and atraumatic.  Eyes: Pupils are equal, round, and reactive to light.  Neck: Neck supple. No tracheal deviation present.  Cardiovascular: Normal rate.   Respiratory: Effort normal. No respiratory distress. He has wheezes.  GI: Soft. There is no tenderness. There is no guarding.  Musculoskeletal:       Left hip: He exhibits decreased range of motion, decreased strength, tenderness and bony tenderness. He exhibits no deformity and no laceration.  Neurological: He is alert.  Skin: Skin is warm and dry.  Psychiatric: He has a normal mood and affect.    Assessment/Plan: Subcapital fracture of the left femoral neck    NPO while waiting on medical clearance for OR tonight Will obtain consent for reduction with percutaneous screws vs hemiarthroplasty Bed rest now.     Pricilla Loveless 06/04/2014, 7:21 AM

## 2014-06-05 DIAGNOSIS — S72002S Fracture of unspecified part of neck of left femur, sequela: Secondary | ICD-10-CM

## 2014-06-05 LAB — BASIC METABOLIC PANEL
Anion gap: 11 (ref 5–15)
BUN: 22 mg/dL (ref 6–23)
CO2: 25 mEq/L (ref 19–32)
Calcium: 8.5 mg/dL (ref 8.4–10.5)
Chloride: 99 mEq/L (ref 96–112)
Creatinine, Ser: 1.08 mg/dL (ref 0.50–1.35)
GFR calc Af Amer: 71 mL/min — ABNORMAL LOW (ref >90)
GFR calc non Af Amer: 61 mL/min — ABNORMAL LOW (ref >90)
Glucose, Bld: 138 mg/dL — ABNORMAL HIGH (ref 70–99)
POTASSIUM: 4.3 meq/L (ref 3.7–5.3)
SODIUM: 135 meq/L — AB (ref 137–147)

## 2014-06-05 LAB — CBC
HEMATOCRIT: 37.6 % — AB (ref 39.0–52.0)
Hemoglobin: 12.6 g/dL — ABNORMAL LOW (ref 13.0–17.0)
MCH: 31.7 pg (ref 26.0–34.0)
MCHC: 33.5 g/dL (ref 30.0–36.0)
MCV: 94.7 fL (ref 78.0–100.0)
Platelets: 169 10*3/uL (ref 150–400)
RBC: 3.97 MIL/uL — ABNORMAL LOW (ref 4.22–5.81)
RDW: 13.2 % (ref 11.5–15.5)
WBC: 10.5 10*3/uL (ref 4.0–10.5)

## 2014-06-05 MED ORDER — SENNOSIDES-DOCUSATE SODIUM 8.6-50 MG PO TABS
2.0000 | ORAL_TABLET | Freq: Every day | ORAL | Status: DC
  Administered 2014-06-05 – 2014-06-06 (×2): 2 via ORAL
  Filled 2014-06-05: qty 1
  Filled 2014-06-05: qty 2
  Filled 2014-06-05: qty 1

## 2014-06-05 NOTE — Progress Notes (Addendum)
     Subjective: 1 Day Post-Op Procedure(s) (LRB): LEFT HIP REDUCTION WITH PERCUTANEOUS SCREWS (Left)   Patient reports pain as mild, pain controlled. No events throughout the night. Have discussed home vs SNF depending on how well he does with PT.  Objective:   VITALS:   Filed Vitals:   06/05/14 0555  BP: 137/75  Pulse: 64  Temp: 97.7 F (36.5 C)  Resp: 26    Dorsiflexion/Plantar flexion intact Incision: dressing C/D/I No cellulitis present Compartment soft  LABS  Recent Labs  06/03/14 2030 06/04/14 0854 06/05/14 0646  HGB 13.1 13.2 12.6*  HCT 39.2 40.1 37.6*  WBC 20.7* 11.6* 10.5  PLT 260 211 169     Recent Labs  06/03/14 2030 06/04/14 0444 06/05/14 0646  NA 140 140 135*  K 5.2 4.8 4.3  BUN 18 21 22   CREATININE 1.21 1.19 1.08  GLUCOSE 152* 129* 138*     Assessment/Plan: 1 Day Post-Op Procedure(s) (LRB): LEFT HIP REDUCTION WITH PERCUTANEOUS SCREWS (Left)  50% WB left leg Advance diet Up with therapy Discharge home with home health vs SNF  Ortho recommendations:  ASA 325 mg bid for 4 weeks for anticoagulation, unless other medically indicated.  Norco for pain management (Rx written).  MiraLax and Colace for constipation  Iron 325 mg tid for 2-3 weeks   WB 50% on the left leg.  Dressing to remain in place for 2-3 days, then replace with gauze and tape.  Follow up in 2 weeks at [facility]. Follow up with OLIN,Kaison Mcparland D in 2 weeks.  Contact information:  [facility] [address] [place] Malia Corsi   PAC  06/05/2014, 8:49 AM

## 2014-06-05 NOTE — Progress Notes (Signed)
Noted that HgbA1C is 6.5% which is a diagnosis of diabetes by the American Diabetes Association guidelines. Physician needs to address. Harvel Ricks RN BSN CDE

## 2014-06-05 NOTE — Evaluation (Signed)
Occupational Therapy Evaluation Patient Details Name: [name] MRN: [id number] DOB: 1928-02-28 Today's Date: 06/05/2014    History of Present Illness [name] is a 80 y.o. male presents with a fractured hip. He states that he was dragging a trash can and slipped and fell. Pt s/p L hip reduction with pinning on 12/15. Pt with hx of COPD, 02 dependent at night, but had limited activity tolerance PTA.   Clinical Impression   Pt was independent prior to admission.  He required pacing and rest breaks due to COPD at home.  Pt presents with decreased endurance, impaired balance, generalized weakness and pain interfering with ability to perform ADL and ADL transfers.  Will follow pt to instruct in use of AE and DME and education weight bearing precautions during ADL.      Follow Up Recommendations  CIR;Supervision/Assistance - 24 hour    Equipment Recommendations  3 in 1 bedside comode;Tub/shower bench    Recommendations for Other Services       Precautions / Restrictions Precautions Precautions: Fall Precaution Comments: watch sats Restrictions Weight Bearing Restrictions: Yes LLE Weight Bearing: Partial weight bearing LLE Partial Weight Bearing Percentage or Pounds: 50      Mobility Bed Mobility Overal bed mobility: Needs Assistance Bed Mobility: Sit to Supine       Sit to supine: Min guard      Transfers Overall transfer level: Needs assistance Equipment used: Rolling walker (2 wheeled) Transfers: Sit to/from Stand Sit to Stand: Min assist         General transfer comment: v/c's for hand placement    Balance                                            ADL Overall ADL's : Needs assistance/impaired Eating/Feeding: Independent   Grooming: Wash/dry hands;Wash/dry face;Oral care;Set up;Sitting   Upper Body Bathing: Set up;Sitting   Lower Body Bathing: Moderate assistance;Sit to/from stand   Upper Body Dressing : Set  up;Sitting   Lower Body Dressing: Moderate assistance;Sit to/from stand   Toilet Transfer: Minimal assistance;RW;Ambulation Armed forces technical officer Details (indicate cue type and reason): simulated from chair         Functional mobility during ADLs: Minimal assistance;Rolling walker;Cueing for safety;Cueing for sequencing General ADL Comments: Educated pt and family on multiple uses of 3 in1, option of tub transfer bench and availability of AE for LB ADL.     Vision                     Perception     Praxis      Pertinent Vitals/Pain Pain Assessment: 0-10 Pain Score: 2  Pain Location: L hip Pain Descriptors / Indicators: Sore Pain Intervention(s): Limited activity within patient's tolerance;Monitored during session;Premedicated before session;Repositioned     Hand Dominance Right   Extremity/Trunk Assessment Upper Extremity Assessment Upper Extremity Assessment: Overall WFL for tasks assessed   Lower Extremity Assessment Lower Extremity Assessment: Defer to PT evaluation   Cervical / Trunk Assessment Cervical / Trunk Assessment: Normal   Communication Communication Communication: No difficulties   Cognition Arousal/Alertness: Awake/alert Behavior During Therapy: WFL for tasks assessed/performed Overall Cognitive Status: Within Functional Limits for tasks assessed                     General Comments       Exercises  Shoulder Instructions      Home Living Family/patient expects to be discharged to:: Private residence Living Arrangements: Spouse/significant other Available Help at Discharge: Family;Available 24 hours/day Type of Home: House   Entrance Stairs-Number of Steps: 1   Home Layout: Two level;Able to live on main level with bedroom/bathroom     Bathroom Shower/Tub: Tub/shower unit;Walk-in shower;Curtain (tub/shower on first floor)   Bathroom Toilet: Standard     Home Equipment: None   Additional Comments: has a toilet  riser, but may not be accessible      Prior Functioning/Environment Level of Independence: Independent        Comments: was on 2LO2 at night    OT Diagnosis: Generalized weakness;Acute pain   OT Problem List: Decreased strength;Decreased activity tolerance;Impaired balance (sitting and/or standing);Decreased knowledge of use of DME or AE;Decreased safety awareness;Pain;Cardiopulmonary status limiting activity   OT Treatment/Interventions: Self-care/ADL training;DME and/or AE instruction;Therapeutic activities;Patient/family education    OT Goals(Current goals can be found in the care plan section) Acute Rehab OT Goals Patient Stated Goal: home for xmas  OT Goal Formulation: With patient Time For Goal Achievement: 06/12/14 Potential to Achieve Goals: Good ADL Goals Pt Will Perform Grooming: with supervision;standing Pt Will Perform Lower Body Bathing: with supervision;with adaptive equipment;sit to/from stand Pt Will Perform Lower Body Dressing: with supervision;with adaptive equipment;sit to/from stand Pt Will Transfer to Toilet: with supervision;ambulating;bedside commode (over toilet) Pt Will Perform Toileting - Clothing Manipulation and hygiene: with supervision;sit to/from stand Pt Will Perform Tub/Shower Transfer: Tub transfer;tub bench;rolling walker;with supervision Additional ADL Goal #1: Pt will adhere to 50% WB status with supervision.  OT Frequency: Min 2X/week   Barriers to D/C:            Co-evaluation              End of Session Equipment Utilized During Treatment: Gait belt;Rolling walker  Activity Tolerance: Treatment limited secondary to medical complications (Comment) (decreased endurance secondary to COPD) Patient left: in bed;with call bell/phone within reach;with family/visitor present   Time: 8032-1224 OT Time Calculation (min): 47 min Charges:  OT General Charges $OT Visit: 1 Procedure OT Evaluation $Initial OT Evaluation Tier I: 1  Procedure OT Treatments $Self Care/Home Management : 23-37 mins G-Codes:    Malka So 06/05/2014, 4:07 PM [phone]

## 2014-06-05 NOTE — Progress Notes (Signed)
Orthopedic Tech Progress Note Patient Details:  [name] 1928-01-10 [id number]  Ortho Devices Ortho Device/Splint Location: trapeze bar patient helper Ortho Device/Splint Interventions: Application   [name] 06/05/2014, 12:13 PM

## 2014-06-05 NOTE — Evaluation (Signed)
Physical Therapy Evaluation Patient Details Name: [name] MRN: [id number] DOB: 1928/06/14 Today's Date: 06/05/2014   History of Present Illness  [name] is a 80 y.o. male presents with a fractured hip. He states that he was dragging a trash can and slipped and fell. Pt s/p L hip reduction with pinning on 12/15.  Clinical Impression  Pt is a very active and indep 80 yo male presenting with decreased activity tolerance, strength and impaired balance due to recent fall, now with L hip pinning. Pt PWB 50% and is motivated to return home by [name]. Pt activity limited by SOB and dec SpO2 to 82% with amb <25 feet. Pt would benefit from CIR to achieve mod I function for safe d/c home with spouse.    Follow Up Recommendations Supervision/Assistance - 24 hour;CIR (unless SOB improves, may be able to go home)    Equipment Recommendations  Rolling walker with 5" wheels;3in1 (PT)    Recommendations for Other Services OT consult     Precautions / Restrictions Precautions Precautions: Fall Restrictions Weight Bearing Restrictions: Yes LLE Weight Bearing: Partial weight bearing LLE Partial Weight Bearing Percentage or Pounds: 50      Mobility  Bed Mobility Overal bed mobility: Needs Assistance Bed Mobility: Supine to Sit     Supine to sit: Min guard;HOB elevated     General bed mobility comments: pt able to manage LEs indep.  Transfers Overall transfer level: Needs assistance Equipment used: Rolling walker (2 wheeled) Transfers: Sit to/from Stand Sit to Stand: Min assist          General transfer comment: v/c's for hand placement  Ambulation/Gait Ambulation/Gait assistance: Min assist Ambulation Distance (Feet): 20 Feet Assistive device: Rolling walker (2 wheeled) Gait Pattern/deviations: Step-to pattern;Antalgic   Gait velocity interpretation: Below normal speed for age/gender General Gait Details: +SOB, SPo2 at 82% on 2LO2 via [place], tolerance limited by  SOB  Stairs            Wheelchair Mobility    Modified Rankin (Stroke Patients Only)       Balance Overall balance assessment: Needs assistance         Standing balance support: Bilateral upper extremity supported Standing balance-Leahy Scale: Poor Standing balance comment: requires use of RW due to PWB 50%                             Pertinent Vitals/Pain Pain Assessment: 0-10 Pain Score: 2  Pain Descriptors / Indicators: Aching    Home Living Family/patient expects to be discharged to:: Private residence Living Arrangements: Spouse/significant other Available Help at Discharge: Family;Available 24 hours/day Type of Home: House Home Access: Stairs to enter   [employer] of Steps: 1 Home Layout: One level Home Equipment: None      Prior Function Level of Independence: Independent         Comments: was on 2LO2 at night     Hand Dominance   Dominant Hand: Right    Extremity/Trunk Assessment   Upper Extremity Assessment: Overall WFL for tasks assessed           Lower Extremity Assessment: LLE deficits/detail   LLE Deficits / Details: grossly 3-/5 due to recent surgery  Cervical / Trunk Assessment: Normal  Communication   Communication: No difficulties  Cognition Arousal/Alertness: Awake/alert Behavior During Therapy: WFL for tasks assessed/performed Overall Cognitive Status: Within Functional Limits for tasks assessed  General Comments General comments (skin integrity, edema, etc.): mobility greatly limited by SOB    Exercises        Assessment/Plan    PT Assessment Patient needs continued PT services  PT Diagnosis Difficulty walking;Abnormality of gait;Generalized weakness   PT Problem List Decreased strength;Decreased range of motion;Decreased activity tolerance;Decreased mobility  PT Treatment Interventions DME instruction;Gait training;Stair training;Functional mobility  training;Therapeutic activities;Therapeutic exercise   PT Goals (Current goals can be found in the Care Plan section) Acute Rehab PT Goals Patient Stated Goal: home for xmas  PT Goal Formulation: With patient Time For Goal Achievement: 06/12/14 Potential to Achieve Goals: Good    Frequency Min 5X/week   Barriers to discharge        Co-evaluation               End of Session Equipment Utilized During Treatment: Gait belt Activity Tolerance: Patient tolerated treatment well (limited by SOB) Patient left: in chair;with call bell/phone within reach Nurse Communication: Mobility status         Time: 0926-0952 PT Time Calculation (min) (ACUTE ONLY): 26 min   Charges:   PT Evaluation $Initial PT Evaluation Tier I: 1 Procedure PT Treatments $Gait Training: 8-22 mins   PT G CodesKingsley Callander 06/05/2014, 12:29 PM   Kittie Plater, PT, DPT Pager #: [phone] Office #: [phone]

## 2014-06-05 NOTE — Progress Notes (Signed)
[facility] PROGRESS NOTE  [name] PJK:[id number] DOB: 08/28/1927 DOA: 06/03/2014 PCP: [name], MD  Assessment/Plan:  Principal Problem:   Closed left hip fracture:  S/p reduction and percutaneous screws. Doing well postoperatively  Active Problems:   Chronic respiratory failure with hypoxia: on home O2, mainly at night    COPD (chronic obstructive pulmonary disease): stable.  PFT 07/14/06 >> FEV1 1.41(51%), FEV1% 60, TLC 7.3(123%), DLCO 75%, no BD  PFT 07/29/10 >> FEV1 1.30(50%), FEV1% 50, TLC 6.39(100%), DLCO 68%, no BD Spirometry 11/17/10 >> FEV1 1.15(36%), FEV1% 56 ONO with RA 11/30/12 >> Test time 9 hrs 30 min. Mean SpO2 92.8%, low SpO2 86%. Spent 3 min with SpO2 < 88% Spirometry 10/26/13 >> FEV1 0.92 (30%), FEV1% 39 ONO with 2 liters 01/16/14 >> test time 8 hrs 36 min. Basal SpO2 93%, low SpO2 85%. Spent 4 min with SpO2 < 88%.  Hyperlipidemia    Hypothyroidism: continue synthroid  Chronic steroids:    Code Status:  full Family Communication:  Son and daughter at bedside Disposition Plan:  CIR?  Consultants:  Ortho  Pulmonary  PM&R  HPI/Subjective: Minimal pain. No dyspnea. Some nausea. Some difficulty voiding.  Objective: Filed Vitals:   06/05/14 1400  BP: 137/54  Pulse: 66  Temp: 97.5 F (36.4 C)  Resp: 24    Intake/Output Summary (Last 24 hours) at 06/05/14 1604 Last data filed at 06/05/14 0800  Gross per 24 hour  Intake   1750 ml  Output   1740 ml  Net     10 ml   Filed Weights   06/03/14 2010  Weight: 87.091 kg (192 lb)    Exam:   General:  Comfortable on nasal cannula O2  Cardiovascular: RRR without MGR  Respiratory: diminished throughout without WRR. Breathing nonlabored  Abdomen: S, NT, ND  Ext: no CCE  Basic Metabolic Panel:  Recent Labs Lab 06/03/14 2030 06/04/14 0444 06/05/14 0646  NA 140 140 135*  K 5.2 4.8 4.3  CL 102 103 99  CO2 26 25 25   GLUCOSE 152* 129* 138*  BUN 18 21 22   CREATININE  1.21 1.19 1.08  CALCIUM 9.2 8.6 8.5   Liver Function Tests:  Recent Labs Lab 06/04/14 0444  AST 17  ALT 14  ALKPHOS 77  BILITOT 0.8  PROT 6.5  ALBUMIN 3.3*   No results for input(s): LIPASE, AMYLASE in the last 168 hours. No results for input(s): AMMONIA in the last 168 hours. CBC:  Recent Labs Lab 06/03/14 2030 06/04/14 0854 06/05/14 0646  WBC 20.7* 11.6* 10.5  NEUTROABS 18.2*  --   --   HGB 13.1 13.2 12.6*  HCT 39.2 40.1 37.6*  MCV 82.7 92.6 94.7  PLT 260 211 169   Cardiac Enzymes: No results for input(s): CKTOTAL, CKMB, CKMBINDEX, TROPONINI in the last 168 hours. BNP (last 3 results) No results for input(s): PROBNP in the last 8760 hours. CBG: No results for input(s): GLUCAP in the last 168 hours.  Recent Results (from the past 240 hour(s))  Surgical pcr screen     Status: None   Collection Time: 06/04/14  9:13 AM  Result Value Ref Range Status   MRSA, PCR NEGATIVE NEGATIVE Final   Staphylococcus aureus NEGATIVE NEGATIVE Final    Comment:        The Xpert SA Assay (FDA approved for NASAL specimens in patients over 34 years of age), is one component of a comprehensive surveillance program.  Test performance has been validated by [facility] for patients greater than or equal to 66 year old. It is not intended to diagnose infection nor to guide or monitor treatment.      Studies: Dg Chest 1 View  06/03/2014   CLINICAL DATA:  Fall.  EXAM: CHEST - 1 VIEW  COMPARISON:  Oct 26, 2013.  FINDINGS: The heart size and mediastinal contours are within normal limits. Both lungs are clear. No pneumothorax or pleural effusion is noted. The visualized skeletal structures are unremarkable.  IMPRESSION: No acute cardiopulmonary abnormality seen.   Electronically Signed   By: Sabino Dick M.D.   On: 06/03/2014 21:33   Dg Hip Complete Left  06/03/2014   CLINICAL DATA:  Fall.  Left hip pain.  EXAM: LEFT HIP - COMPLETE 2+ VIEW  COMPARISON:  None  FINDINGS: Abnormal flat  and irregular appearance of the junction of the left femoral head and neck suspicious for subtle fracture.  Spurring of the acetabula.  IMPRESSION: Nondisplaced subcapital fracture of the left femoral neck.   Electronically Signed   By: Sherryl Barters M.D.   On: 06/03/2014 21:33   Dg Hip Operative Left  06/04/2014   CLINICAL DATA:  Status post ORIF of left hip fracture.  EXAM: OPERATIVE LEFT HIP  COMPARISON:  06/03/2014.  FINDINGS: Two intraoperative fluoroscopic spot views of the left hip demonstrate interval placement of what appear to be 4 cannulated fixation screws traversing the previously noted subcapital femoral neck fracture. Anatomic alignment appears preserved.  IMPRESSION: 1. Intraoperative documentation of ORIF for left subcapital femoral neck fracture, as above.   Electronically Signed   By: Vinnie Langton M.D.   On: 06/04/2014 18:59   Dg Femur Left  06/03/2014   CLINICAL DATA:  Fall.  Left hip pain.  EXAM: LEFT FEMUR - 2 VIEW  COMPARISON:  06/03/2014 hip radiographs  FINDINGS: Osteoarthritis of the knee with medial compartmental articular space narrowing. Vascular calcifications are present. Mild spurring along the articular margin of the patella. No fracture in the distal femur identified; the proximal femur is assessed on the hip radiographs.  IMPRESSION: 1. No mid or distal femoral fracture is evident. Proximal femur assessed on hip radiographs. 2. Osteoarthritis of the knee. 3. Atherosclerosis.   Electronically Signed   By: Sherryl Barters M.D.   On: 06/03/2014 21:32    Scheduled Meds: . budesonide-formoterol  2 puff Inhalation BID  . docusate sodium  100 mg Oral BID  . enoxaparin (LOVENOX) injection  40 mg Subcutaneous Q24H  . folic acid  1 mg Oral Daily  . levothyroxine  50 mcg Oral QAC breakfast  . multivitamin with minerals  1 tablet Oral Daily  . predniSONE  2.5 mg Oral Q breakfast  . simvastatin  5 mg Oral QHS  . thiamine  100 mg Oral Daily  . tiotropium  18 mcg  Inhalation Daily   Continuous Infusions: . sodium chloride 50 mL/hr at 06/03/14 2355  . sodium chloride 50 mL/hr at 06/05/14 0700    Time spent: 15 minutes  [facility] Text page [email or web address], password [facility] 06/05/2014, 4:04 PM  LOS: 2 days

## 2014-06-05 NOTE — Progress Notes (Signed)
Rehab Admissions Coordinator Note:  Patient was screened by Retta Diones for appropriateness for an Inpatient Acute Rehab Consult.  At this time, we are recommending Inpatient Rehab consult.  Retta Diones 06/05/2014, 1:34 PM  I can be reached at [phone].

## 2014-06-05 NOTE — Consult Note (Signed)
Physical Medicine and Rehabilitation Consult   Reason for Consult: Left femur fracture.  Referring Physician: Dr. Conley Canal.    HPI: [name] is a 80 y.o. male with history of COPD, HTN, who slipped and fell on to his left hip while dragging his trash can on 06/03/14. He had difficulty weight bearing on left leg and was admitted for workup. X rays done revealing subcapital left femoral neck fracture. He was evaluated by Dr. Alvan Dame and underwent closed reduction with pinning left femur on 06/04/14. Post op is PWB LLE and on Lovenox for DVT prophylaxis. He has had difficulty voiding post op as well as ABLA with drop in H/H to 12.6/37.6.  PT evaluation done today and patient limited by SOB with minimal activity. MD/ rehab team recommending CIR.    Review of Systems  HENT: Negative for hearing loss.   Eyes: Negative for blurred vision and double vision.  Respiratory: Positive for cough and shortness of breath (with minimal acitivity--paces himself).   Cardiovascular: Negative for chest pain and palpitations.  Gastrointestinal: Negative for heartburn, nausea, abdominal pain and constipation.  Genitourinary: Negative for urgency and frequency.  Musculoskeletal: Positive for joint pain.  Neurological: Negative for headaches.  Psychiatric/Behavioral: The patient is not nervous/anxious and does not have insomnia.       Past Medical History  Diagnosis Date  . COPD (chronic obstructive pulmonary disease)   . Hyperlipidemia   . Hypothyroidism   . Pulmonary nodule, right   . Tobacco abuse     Quit 09/17/10    Past Surgical History  Procedure Laterality Date  . Tracheostomy      after aspiration event as a 80 y/o  . Electronavigational bronchoscopy  07/2010    History reviewed. No pertinent family history.    Social History:  Married. Retired [employer] for [employer]. He reports that he quit smoking about 3 years ago. His smoking use included Cigarettes. He has a 55  pack-year smoking history. He has never used smokeless tobacco. He reports that he does not drink alcohol. His drug history is not on file.    Allergies  Allergen Reactions  . Sulfonamide Derivatives     unknown     Medications Prior to Admission  Medication Sig Dispense Refill  . albuterol (VENTOLIN HFA) 108 (90 BASE) MCG/ACT inhaler Inhale 2 puffs into the lungs every 4 (four) hours as needed for shortness of breath.     . budesonide-formoterol (SYMBICORT) 160-4.5 MCG/ACT inhaler Inhale 2 puffs into the lungs 2 (two) times daily. 1 Inhaler 5  . levothyroxine (SYNTHROID, LEVOTHROID) 50 MCG tablet Take 50 mcg by mouth daily.    . predniSONE (DELTASONE) 5 MG tablet Take 0.5 tablets (2.5 mg total) by mouth daily with breakfast. 30 tablet 3  . simvastatin (ZOCOR) 5 MG tablet Take 5 mg by mouth at bedtime.      Marland Kitchen tiotropium (SPIRIVA HANDIHALER) 18 MCG inhalation capsule Place 1 capsule (18 mcg total) into inhaler and inhale daily. 30 capsule 5    Home: Home Living Family/patient expects to be discharged to:: Private residence Living Arrangements: Spouse/significant other Available Help at Discharge: Family, Available 24 hours/day Type of Home: House Home Access: Stairs to enter Technical brewer of Steps: 1 Home Layout: One level Home Equipment: None  Functional History: Prior Function Level of Independence: Independent Comments: was on 2LO2 at night Functional Status:  Mobility: Bed Mobility Overal bed mobility: Needs Assistance Bed Mobility: Supine to Sit Supine to sit: Min  guard, HOB elevated General bed mobility comments: pt able to manage LEs indep. Transfers Overall transfer level: Needs assistance Equipment used: Rolling walker (2 wheeled) Transfers: Sit to/from Stand Sit to Stand: Min guard General transfer comment: v/c's for hand placement Ambulation/Gait Ambulation/Gait assistance: Min assist Ambulation Distance (Feet): 20 Feet Assistive device: Rolling  walker (2 wheeled) Gait Pattern/deviations: Step-to pattern, Antalgic Gait velocity interpretation: Below normal speed for age/gender General Gait Details: +SOB, SPo2 at 82% on 2LO2 via [place], tolerance limited by SOB    ADL:    Cognition: Cognition Overall Cognitive Status: Within Functional Limits for tasks assessed Orientation Level: Oriented X4 Cognition Arousal/Alertness: Awake/alert Behavior During Therapy: WFL for tasks assessed/performed Overall Cognitive Status: Within Functional Limits for tasks assessed  Blood pressure 137/54, pulse 66, temperature 97.5 F (36.4 C), temperature source Oral, resp. rate 24, height 5\' 10"  (1.778 m), weight 87.091 kg (192 lb), SpO2 96 %. Physical Exam  Nursing note and vitals reviewed. Constitutional: He is oriented to person, place, and time. He appears well-developed and well-nourished.  HENT:  Head: Normocephalic and atraumatic.  Eyes: Conjunctivae are normal. Pupils are equal, round, and reactive to light.  Neck: Normal range of motion. Neck supple.  Cardiovascular: Normal rate and regular rhythm.   Respiratory: Effort normal and breath sounds normal. No respiratory distress. He has no wheezes.  Wearing oxygen  GI: Soft. Bowel sounds are normal. He exhibits no distension. There is no tenderness.  Musculoskeletal:  Moves BUE and RLE without difficulty. LLE with minimal discomfort with activity.   Neurological: He is alert and oriented to person, place, and time.  Speech clear. Follows commands without difficulty. UE's 4+/5. RLE 3+HF, 4/5 knee and ankle. Left lower ext limited by pain.   Skin: Skin is warm and dry.  Psychiatric: He has a normal mood and affect. His behavior is normal. Judgment and thought content normal.    Results for orders placed or performed during the hospital encounter of 06/03/14 (from the past 24 hour(s))  CBC     Status: Abnormal   Collection Time: 06/05/14  6:46 AM  Result Value Ref Range   WBC 10.5 4.0 -  10.5 K/uL   RBC 3.97 (L) 4.22 - 5.81 MIL/uL   Hemoglobin 12.6 (L) 13.0 - 17.0 g/dL   HCT 37.6 (L) 39.0 - 52.0 %   MCV 94.7 78.0 - 100.0 fL   MCH 31.7 26.0 - 34.0 pg   MCHC 33.5 30.0 - 36.0 g/dL   RDW 13.2 11.5 - 15.5 %   Platelets 169 150 - 400 K/uL  Basic metabolic panel     Status: Abnormal   Collection Time: 06/05/14  6:46 AM  Result Value Ref Range   Sodium 135 (L) 137 - 147 mEq/L   Potassium 4.3 3.7 - 5.3 mEq/L   Chloride 99 96 - 112 mEq/L   CO2 25 19 - 32 mEq/L   Glucose, Bld 138 (H) 70 - 99 mg/dL   BUN 22 6 - 23 mg/dL   Creatinine, Ser 1.08 0.50 - 1.35 mg/dL   Calcium 8.5 8.4 - 10.5 mg/dL   GFR calc non Af Amer 61 (L) >90 mL/min   GFR calc Af Amer 71 (L) >90 mL/min   Anion gap 11 5 - 15   Dg Chest 1 View  06/03/2014   CLINICAL DATA:  Fall.  EXAM: CHEST - 1 VIEW  COMPARISON:  Oct 26, 2013.  FINDINGS: The heart size and mediastinal contours are within normal limits. Both lungs  are clear. No pneumothorax or pleural effusion is noted. The visualized skeletal structures are unremarkable.  IMPRESSION: No acute cardiopulmonary abnormality seen.   Electronically Signed   By: Sabino Dick M.D.   On: 06/03/2014 21:33   Dg Hip Complete Left  06/03/2014   CLINICAL DATA:  Fall.  Left hip pain.  EXAM: LEFT HIP - COMPLETE 2+ VIEW  COMPARISON:  None  FINDINGS: Abnormal flat and irregular appearance of the junction of the left femoral head and neck suspicious for subtle fracture.  Spurring of the acetabula.  IMPRESSION: Nondisplaced subcapital fracture of the left femoral neck.   Electronically Signed   By: Sherryl Barters M.D.   On: 06/03/2014 21:33   Dg Hip Operative Left  06/04/2014   CLINICAL DATA:  Status post ORIF of left hip fracture.  EXAM: OPERATIVE LEFT HIP  COMPARISON:  06/03/2014.  FINDINGS: Two intraoperative fluoroscopic spot views of the left hip demonstrate interval placement of what appear to be 4 cannulated fixation screws traversing the previously noted subcapital femoral  neck fracture. Anatomic alignment appears preserved.  IMPRESSION: 1. Intraoperative documentation of ORIF for left subcapital femoral neck fracture, as above.   Electronically Signed   By: Vinnie Langton M.D.   On: 06/04/2014 18:59   Dg Femur Left  06/03/2014   CLINICAL DATA:  Fall.  Left hip pain.  EXAM: LEFT FEMUR - 2 VIEW  COMPARISON:  06/03/2014 hip radiographs  FINDINGS: Osteoarthritis of the knee with medial compartmental articular space narrowing. Vascular calcifications are present. Mild spurring along the articular margin of the patella. No fracture in the distal femur identified; the proximal femur is assessed on the hip radiographs.  IMPRESSION: 1. No mid or distal femoral fracture is evident. Proximal femur assessed on hip radiographs. 2. Osteoarthritis of the knee. 3. Atherosclerosis.   Electronically Signed   By: Sherryl Barters M.D.   On: 06/03/2014 21:32    Assessment/Plan: Diagnosis: left Femoral neck fx 1. Does the need for close, 24 hr/day medical supervision in concert with the patient's rehab needs make it unreasonable for this patient to be served in a less intensive setting? Yes 2. Co-Morbidities requiring supervision/potential complications: copd, hypoxia,  3. Due to bladder management, bowel management, safety, skin/wound care, disease management, medication administration, pain management and patient education, does the patient require 24 hr/day rehab nursing? Yes 4. Does the patient require coordinated care of a physician, rehab nurse, PT (1-2 hrs/day, 5 days/week) and OT (1-2 hrs/day, 5 days/week) to address physical and functional deficits in the context of the above medical diagnosis(es)? Yes Addressing deficits in the following areas: balance, endurance, locomotion, strength, transferring, bowel/bladder control, bathing, dressing, feeding, grooming, toileting and psychosocial support 5. Can the patient actively participate in an intensive therapy program of at least 3  hrs of therapy per day at least 5 days per week? Yes 6. The potential for patient to make measurable gains while on inpatient rehab is excellent 7. Anticipated functional outcomes upon discharge from inpatient rehab are modified independent  with PT, modified independent with OT, n/a with SLP. 8. Estimated rehab length of stay to reach the above functional goals is: 7-9 days 9. Does the patient have adequate social supports and living environment to accommodate these discharge functional goals? Yes 10. Anticipated D/C setting: Home 11. Anticipated post D/C treatments: HH therapy and Outpatient therapy 12. Overall Rehab/Functional Prognosis: excellent  RECOMMENDATIONS: This patient's condition is appropriate for continued rehabilitative care in the following setting: CIR Patient has agreed  to participate in recommended program. Yes Note that insurance prior authorization may be required for reimbursement for recommended care.  Comment: Rehab Admissions Coordinator to follow up.  Thanks,  Meredith Staggers, MD, Mellody Drown     06/05/2014

## 2014-06-05 NOTE — Progress Notes (Signed)
Pt received spinal for surgery. Did not have foley in place and was not in and out cathed after case. Pt was due to void on arrival to floor, had episode of incontinence around 2300 12/15. Pt was encouraged to drink fluids, is receiving IV fluids, and attempts to void this morning by sitting on edge of bed were unsuccessful. Bladder scan at 0615 showed 605 ccs. Notified on call for [facility], received order to in and cath. Performed at 0700 with 850 ccs output. Nursing will continue to monitor.  Raquel Vera 06/05/2014 7:52 AM

## 2014-06-05 NOTE — Progress Notes (Signed)
Patient unable to void spontaneously after in and out cath at 0700. He states he has no urge to void. Bladder Scan revealed 44ml of urine in bladder. Will continue to monitor for two more hours and or patients discomfort.

## 2014-06-05 NOTE — Progress Notes (Signed)
Name: [name] MRN: [id number] DOB: [birth year]    ADMISSION DATE:  06/03/2014 CONSULTATION DATE:  06/04/14  REFERRING MD :  Orthopedics  CHIEF COMPLAINT:  Pulmonary clearance, COPD  BRIEF PATIENT DESCRIPTION: 80 y/o M, former smoker (quit in 2012), with a PMH of severe O2 dependent COPD admitted 12/14 after mechanical fall with left hip fracture.  PCCM consulted for pre-op pulmonary evaluation. pmh - severe COPD on 2 L O2 ( FEV1 1.30(50%), FEV1% 50, TLC 6.39(100%), DLCO 68%, no BD in 2012), and prior tracheostomy (after an aspiration event at age 73)   SIGNIFICANT EVENTS  12/14  Admit after mechanical fall with left hip fracture  STUDIES:  12/14  CXR >> no acute abnormality 12/14  HIP / Femur >> non-displaced subcapital fx of L femoral neck, no femur fx noted   SUBJECTIVE:  C/o nausea this am Pain 3/10 Surgery uneventful - under spinal - urinary retention overnight Breathing ok   VITAL SIGNS: Temp:  [97.5 F (36.4 C)-98.7 F (37.1 C)] 97.7 F (36.5 C) (12/16 0555) Pulse Rate:  [64-92] 64 (12/16 0555) Resp:  [15-36] 26 (12/16 0555) BP: (120-146)/(50-84) 137/75 mmHg (12/16 0555) SpO2:  [94 %-100 %] 96 % (12/16 0730)  PHYSICAL EXAMINATION: General:  WDWN elderly male in no acute distress Neuro:  AAO 4, speech clear, left lower extremity decreased mobility due to fracture HEENT:  MM pink/moist, no JVD Cardiovascular:  s1s2 rrr, no m Lungs:  Even/nonlabored, lungs bilaterally distant, no wheeze  Abdomen:  Nontender, nondistended, BS 4 active Musculoskeletal:  No obvious deformity, decreased ROM, decreased strength, tenderness to palpation/movement Skin:  Warm and dry, no edema   Recent Labs Lab 06/03/14 2030 06/04/14 0444 06/05/14 0646  NA 140 140 135*  K 5.2 4.8 4.3  CL 102 103 99  CO2 26 25 25   BUN 18 21 22   CREATININE 1.21 1.19 1.08  GLUCOSE 152* 129* 138*    Recent Labs Lab 06/03/14 2030 06/04/14 0854 06/05/14 0646  HGB 13.1 13.2 12.6*    HCT 39.2 40.1 37.6*  WBC 20.7* 11.6* 10.5  PLT 260 211 169   Dg Chest 1 View  06/03/2014   CLINICAL DATA:  Fall.  EXAM: CHEST - 1 VIEW  COMPARISON:  Oct 26, 2013.  FINDINGS: The heart size and mediastinal contours are within normal limits. Both lungs are clear. No pneumothorax or pleural effusion is noted. The visualized skeletal structures are unremarkable.  IMPRESSION: No acute cardiopulmonary abnormality seen.   Electronically Signed   By: Sabino Dick M.D.   On: 06/03/2014 21:33   Dg Hip Complete Left  06/03/2014   CLINICAL DATA:  Fall.  Left hip pain.  EXAM: LEFT HIP - COMPLETE 2+ VIEW  COMPARISON:  None  FINDINGS: Abnormal flat and irregular appearance of the junction of the left femoral head and neck suspicious for subtle fracture.  Spurring of the acetabula.  IMPRESSION: Nondisplaced subcapital fracture of the left femoral neck.   Electronically Signed   By: Sherryl Barters M.D.   On: 06/03/2014 21:33   Dg Hip Operative Left  06/04/2014   CLINICAL DATA:  Status post ORIF of left hip fracture.  EXAM: OPERATIVE LEFT HIP  COMPARISON:  06/03/2014.  FINDINGS: Two intraoperative fluoroscopic spot views of the left hip demonstrate interval placement of what appear to be 4 cannulated fixation screws traversing the previously noted subcapital femoral neck fracture. Anatomic alignment appears preserved.  IMPRESSION: 1. Intraoperative documentation of ORIF for left subcapital femoral neck fracture,  as above.   Electronically Signed   By: Vinnie Langton M.D.   On: 06/04/2014 18:59   Dg Femur Left  06/03/2014   CLINICAL DATA:  Fall.  Left hip pain.  EXAM: LEFT FEMUR - 2 VIEW  COMPARISON:  06/03/2014 hip radiographs  FINDINGS: Osteoarthritis of the knee with medial compartmental articular space narrowing. Vascular calcifications are present. Mild spurring along the articular margin of the patella. No fracture in the distal femur identified; the proximal femur is assessed on the hip radiographs.   IMPRESSION: 1. No mid or distal femoral fracture is evident. Proximal femur assessed on hip radiographs. 2. Osteoarthritis of the knee. 3. Atherosclerosis.   Electronically Signed   By: Sherryl Barters M.D.   On: 06/03/2014 21:32    ASSESSMENT / PLAN:   Chronic respiratory failure with hypoxemia - baseline home O2 2 L/[place] Severe O2 dependent COPD - baseline 2.5 mg pred dependent, Symbicort + Spiriva with rescue albuterol  Plan:  Continue current pulmonary regimen:  Symbicort, spiriva, 2.5mg  pred + PRN albuterol neb O2 at 2L, titrate for sats 88-94% -can dc cont pulse ox Pulmonary hygiene:  IS, mobilize as able Dc stress steroids Minimize sedating medications as able  Stool softener with risk of constipation in setting of narcs  PCCM to sign off  - he has FU appt with dr Dellia Beckwith MD 06/05/2014, 9:14 AM

## 2014-06-05 NOTE — Op Note (Signed)
NAME:  [name], [name] NO.:  [id number]  MEDICAL RECORD NO.:  [id number]  LOCATION:  5N32C                        FACILITY:  [facility]  PHYSICIAN:  Pietro Cassis. Alvan Dame, M.D.  DATE OF BIRTH:  1928/02/04  DATE OF PROCEDURE:  06/04/2014 DATE OF DISCHARGE:                              OPERATIVE REPORT   PREOPERATIVE DIAGNOSIS:  Left nondisplaced femoral neck fracture.  POSTOPERATIVE DIAGNOSIS:  Left nondisplaced femoral neck fracture.  PROCEDURE:  Closed reduction and percutaneous cannula screw fixation utilizing 4 Biomet cannulated screws.  SURGEON:  Pietro Cassis. Alvan Dame, M.D.  ASSISTANT:  Surgical team.  ANESTHESIA:  Spinal due to chronic obstructive pulmonary disease.  SPECIMENS:  None.  COMPLICATION:  None.  BLOOD LOSS:  Minimal.  INDICATION FOR THE PROCEDURE:  [name] is an very pleasant 80 year old male, who unfortunately slipped on a wet surface bringing the trash cans in.  He landed on his left hip with immediate onset of pain and he was brought to the emergency room where radiographs revealed a fracture through the subcapital region of the femoral neck.  He was seen and evaluated and based on the nondisplaced nature of his fracture, I recommended percutaneous cannulated screw fixation. Reviewing risks of nonunion and avascular necrosis, need for future surgery versus the option of a hip hemiarthroplasty.  Based on his nondisplaced nature of the fracture, I felt that he had a great chance of needing without complication.  Risks of infection and DVT were also discussed.  Consent was obtained for benefit of fracture management and pain relief.  PROCEDURE IN DETAIL:  The patient was brought to the operative theater. Once adequate anesthesia, preoperative antibiotics, Ancef administered, he was positioned supine on the fracture table.  His unexpected right lower extremity was flexed and abducted out of the way with bony with his lateral peroneal nerves  padded.  His perineal post was applied.  The left foot was placed in traction boot.  A gentle traction and internal rotation was applied and fluoroscopy was brought to the field to confirm reduction of the fracture site.  It was confirmed on AP and lateral planes.  We did a time-out confirming the patient, planned procedure, and extremity.  The left lower extremity was then prepped and draped in a sterile fashion using a shower curtain technique.  Once it was prepped and draped, fluoroscopy was brought back to the field and using a guide pin, I confirmed the orientation in AP and lateral planes.  The incision was then made laterally at the level of the lesser trochanter, through the iliotibial band.  I then placed a guidewire into the inferior aspect of the femoral neck in the center portion and subsequently then placed pins posterior, anterior and 1 central to provide extra support.  Once these pins were appropriately positioned in the AP and lateral planes using fluoroscopy for confirmation, I then drilled the outer cortex, measured the depth and selected appropriate screws.  The inferior screw was placed first and tightened down to lateral cortex was purchased. The posterior screw placed and mixed with tacking down to the lateral cortex with excellent purchase.  The 2 other screws anterior and central were placed and tightened  appropriately.  Final radiographs were obtained once the pins were removed.  The fracture reduction remained stable from bone quality.  It was very good in terms of purchasing these cancellous screws to allow for compression across the fracture site.  Following the conclusion of the procedure, the wound was irrigated with normal saline solution.  The single stitch was placed in the iliotibial band.  The remainder of the wound was closed with 2-0 Vicryl and running 4-0 Monocryl.  The wound was then cleaned, dried and dressed sterilely using Dermabond for  surgical glue and a Mepilex dressing.  He was then brought and transferred to the recovery room in stable condition tolerating the procedure well.  They can be partial weightbearing 50%.  We will start him with physical therapy to determine his immediate disposition.  Findings will be reviewed with family.     Pietro Cassis Alvan Dame, M.D.     MDO/MEDQ  D:  06/04/2014  T:  06/05/2014  Job:  [id number]

## 2014-06-06 DIAGNOSIS — J438 Other emphysema: Secondary | ICD-10-CM

## 2014-06-06 MED ORDER — HYDROCODONE-ACETAMINOPHEN 5-325 MG PO TABS: 1.0000 | ORAL_TABLET | Freq: Four times a day (QID) | ORAL | Status: DC | PRN

## 2014-06-06 NOTE — Progress Notes (Signed)
Occupational Therapy Treatment Patient Details Name: BRANDIS MATSUURA MRN: [id number] DOB: 1928-05-16 Today's Date: 06/06/2014    History of present illness BRONSON BRESSMAN is a 80 y.o. male presents with a fractured hip. He states that he was dragging a trash can and slipped and fell. Pt s/p L hip reduction with pinning on 12/15. Pt with hx of COPD, 02 dependent at night, but had limited activity tolerance PTA.   OT comments  Pt seen today to perform ADLs in standing to increase endurance. Pt able to perform grooming tasks in standing at sink without rest break. VC's to breathe through his nose for increased efficiency of O2 as pt has tendency to breathe quickly through his mouth when fatigued. Pt continues to be an excellent candidate for CIR given COPD and decreased ROM as a result of hip pinning. Pt is motivated to return home by Christmas.    Follow Up Recommendations  CIR;Supervision/Assistance - 24 hour    Equipment Recommendations  3 in 1 bedside comode;Tub/shower bench    Recommendations for Other Services      Precautions / Restrictions Precautions Precautions: Fall Precaution Comments: watch sats Restrictions Weight Bearing Restrictions: Yes LLE Weight Bearing: Partial weight bearing LLE Partial Weight Bearing Percentage or Pounds: 50       Mobility Bed Mobility               General bed mobility comments: Pt sitting up in recliner when OT arrived.   Transfers Overall transfer level: Needs assistance Equipment used: Rolling walker (2 wheeled) Transfers: Sit to/from Stand Sit to Stand: Min guard         General transfer comment: min guard for safety and balance. No physical assist needed. Good hand placement.        ADL Overall ADL's : Needs assistance/impaired     Grooming: Oral care;Wash/dry face;Min guard;Standing       Lower Body Bathing: Minimal assistance;Sit to/from stand       Lower Body Dressing: Moderate assistance;Sit to/from  stand   Toilet Transfer: Min guard;Ambulation;RW (BSC over toilet)   Toileting- Clothing Manipulation and Hygiene: Min guard;Sit to/from stand       Functional mobility during ADLs: Minimal assistance;Rolling walker General ADL Comments: Pt ambulated to bathroom for toileting and stood at sink for ~4 minutes for grooming tasks without requiring a rest break to address endurance. Pt then returned to recliner chair.                 Cognition  Arousal/Alertness: Awake/Alert Behavior During Therapy: WFL for tasks assessed/performed Overall Cognitive Status: Within Functional Limits for tasks assessed                                    Pertinent Vitals/ Pain       Pain Assessment: 0-10 Pain Score: 2  Pain Location: L hip Pain Descriptors / Indicators: Sore Pain Intervention(s): Monitored during session;Repositioned         Frequency Min 2X/week     Progress Toward Goals  OT Goals(current goals can now be found in the care plan section)  Progress towards OT goals: Progressing toward goals  Acute Rehab OT Goals Patient Stated Goal: to get into CIR then go home for Christmas  Plan Discharge plan remains appropriate       End of Session Equipment Utilized During Treatment: Gait belt;Rolling walker;Oxygen   Activity Tolerance Patient tolerated treatment  well   Patient Left in chair;with call bell/phone within reach   Nurse Communication Other (comment) (unable to get O2 reading due to pt cold hands)        Time: 2919-1660 OT Time Calculation (min): 26 min  Charges: OT General Charges $OT Visit: 1 Procedure OT Treatments $Self Care/Home Management : 23-37 mins  Juluis Rainier 06/06/2014, 10:34 AM   Cyndie Chime, OTR/L Occupational Therapist [phone] (pager)

## 2014-06-06 NOTE — Progress Notes (Signed)
[facility] PROGRESS NOTE  [name] XNA:[id number] DOB: 1945/05/07 DOA: 06/03/2014 PCP: [name], MD  Assessment/Plan:  Principal Problem:   Closed left hip fracture:  S/p reduction and percutaneous screws. Doing well postoperatively  Active Problems:   Chronic respiratory failure with hypoxia: on home O2, mainly at night    COPD (chronic obstructive pulmonary disease): stable.    Hypothyroidism: continue synthroid  Chronic steroids:    Code Status:  full Family Communication:  daughter Disposition Plan:  snf  Consultants:  Ortho  Pulmonary  PM&R  HPI/Subjective: Minimal pain. No dyspnea. Eating a bit  Objective: Filed Vitals:   06/06/14 2200  BP: 129/60  Pulse: 79  Temp: 98.4 F (36.9 C)  Resp: 22    Intake/Output Summary (Last 24 hours) at 06/06/14 2231 Last data filed at 06/06/14 2121  Gross per 24 hour  Intake    480 ml  Output   1000 ml  Net   -520 ml   Filed Weights   06/03/14 2010  Weight: 87.091 kg (192 lb)    Exam:   General:  Comfortable on nasal cannula O2  Cardiovascular: RRR without MGR  Respiratory: diminished throughout without WRR. Breathing nonlabored  Abdomen: S, NT, ND  Ext: no CCE  Basic Metabolic Panel:  Recent Labs Lab 06/03/14 2030 06/04/14 0444 06/05/14 0646  NA 140 140 135*  K 5.2 4.8 4.3  CL 102 103 99  CO2 26 25 25   GLUCOSE 152* 129* 138*  BUN 18 21 22   CREATININE 1.21 1.19 1.08  CALCIUM 9.2 8.6 8.5   Liver Function Tests:  Recent Labs Lab 06/04/14 0444  AST 17  ALT 14  ALKPHOS 77  BILITOT 0.8  PROT 6.5  ALBUMIN 3.3*   No results for input(s): LIPASE, AMYLASE in the last 168 hours. No results for input(s): AMMONIA in the last 168 hours. CBC:  Recent Labs Lab 06/03/14 2030 06/04/14 0854 06/05/14 0646  WBC 20.7* 11.6* 10.5  NEUTROABS 18.2*  --   --   HGB 13.1 13.2 12.6*  HCT 39.2 40.1 37.6*  MCV 82.7 92.6 94.7  PLT 260 211 169   Cardiac Enzymes: No results  for input(s): CKTOTAL, CKMB, CKMBINDEX, TROPONINI in the last 168 hours. BNP (last 3 results) No results for input(s): PROBNP in the last 8760 hours. CBG: No results for input(s): GLUCAP in the last 168 hours.  Recent Results (from the past 240 hour(s))  Surgical pcr screen     Status: None   Collection Time: 06/04/14  9:13 AM  Result Value Ref Range Status   MRSA, PCR NEGATIVE NEGATIVE Final   Staphylococcus aureus NEGATIVE NEGATIVE Final    Comment:        The Xpert SA Assay (FDA approved for NASAL specimens in patients over 78 years of age), is one component of a comprehensive surveillance program.  Test performance has been validated by [employer] for patients greater than or equal to 78 year old. It is not intended to diagnose infection nor to guide or monitor treatment.      Studies: No results found.  Scheduled Meds: . budesonide-formoterol  2 puff Inhalation BID  . enoxaparin (LOVENOX) injection  40 mg Subcutaneous Q24H  . folic acid  1 mg Oral Daily  . levothyroxine  50 mcg Oral QAC breakfast  . multivitamin with minerals  1 tablet Oral Daily  . predniSONE  2.5 mg Oral Q breakfast  . senna-docusate  2 tablet Oral QHS  . simvastatin  5 mg Oral QHS  . thiamine  100 mg Oral Daily  . tiotropium  18 mcg Inhalation Daily   Continuous Infusions: . sodium chloride 50 mL/hr at 06/03/14 2355  . sodium chloride Stopped (06/06/14 0800)    Time spent: 15 minutes  [facility] Text page [email or web address], password [facility] 06/06/2014, 10:31 PM  LOS: 3 days

## 2014-06-06 NOTE — Progress Notes (Signed)
Patient ID: [name], male   DOB: [birth year], 80 y.o.   MRN: [id number] Subjective: 2 Days Post-Op Procedure(s) (LRB): LEFT HIP REDUCTION WITH PERCUTANEOUS SCREWS (Left)    Patient reports pain as mild.  Able to move leg around better than pre-operatively.  No events.  Feels breathing has not been significantly changed  Objective:   VITALS:   Filed Vitals:   06/06/14 0626  BP: 139/67  Pulse: 80  Temp: 98.5 F (36.9 C)  Resp: 20    Neurovascular intact Incision: dressing C/D/I  LABS  Recent Labs  06/03/14 2030 06/04/14 0854 06/05/14 0646  HGB 13.1 13.2 12.6*  HCT 39.2 40.1 37.6*  WBC 20.7* 11.6* 10.5  PLT 260 211 169     Recent Labs  06/03/14 2030 06/04/14 0444 06/05/14 0646  NA 140 140 135*  K 5.2 4.8 4.3  BUN 18 21 22   CREATININE 1.21 1.19 1.08  GLUCOSE 152* 129* 138*     Recent Labs  06/03/14 2030  INR 1.01     Assessment/Plan: 2 Days Post-Op Procedure(s) (LRB): LEFT HIP REDUCTION WITH PERCUTANEOUS SCREWS (Left)   Up with therapy Discharge plan pending evaluation from CIR  PWB left lower extremity At discharge, Aspirin BID for DVT prophylaxis Norco for pain

## 2014-06-06 NOTE — Clinical Social Work Psychosocial (Signed)
Clinical Social Work Department BRIEF PSYCHOSOCIAL ASSESSMENT 06/06/2014  Patient:  [name], [name]     Account Number:  [id number]     Admit date:  06/03/2014  Clinical Social Worker:  Delrae Sawyers  Date/Time:  06/06/2014 03:37 PM  Referred by:  Physician  Date Referred:  06/06/2014 Referred for  SNF Placement   Other Referral:   CIR.   Interview type:  Patient Other interview type:   Pt's wife, [name], also present at bedside.    PSYCHOSOCIAL DATA Living Status:  WIFE Admitted from facility:   Level of care:   Primary support name:  [name] Primary support relationship to patient:  SPOUSE Degree of support available:   Strong support system.    CURRENT CONCERNS Current Concerns  Post-Acute Placement   Other Concerns:   none.    SOCIAL WORK ASSESSMENT / PLAN CSW received referral for possible SNF placement at time of discharge. CSW met with pt and pt's wife at bedside to discuss alternative discharge dispositions to CIR. Pt and pt's wife agreeable to SNF placement as an alternative to CIR. Pt's wife stated preference for [facility] or [facility]. CSW to continue to follow and assist with discharge planning needs.   Assessment/plan status:  Psychosocial Support/Ongoing Assessment of Needs Other assessment/ plan:   none.   Information/referral to community resources:   [facility] bed offers.    PATIENT'S/FAMILY'S RESPONSE TO PLAN OF CARE: Pt and pt's wife understanding and agreeable to CSW plan of care. Pt and pt's expressed no further questions or concerns at this time.       Lubertha Sayres, [place] ([phone]) Licensed Clinical Social Worker Orthopedics [phone]) and Surgical [phone])

## 2014-06-06 NOTE — Progress Notes (Signed)
Physical Therapy Treatment Patient Details Name: [name] MRN: [id number] DOB: 06/20/1928 Today's Date: 06/06/2014    History of Present Illness [name] is a 80 y.o. male presents with a fractured hip. He states that he was dragging a trash can and slipped and fell. Pt s/p L hip reduction with pinning on 12/15. Pt with hx of COPD, 02 dependent at night, but had limited activity tolerance PTA.    PT Comments     Pt very motivated and progressing with therapy. Pt continues to be limited by new onset of SOB. Pt ambulated on 2L of O2 and was at 98% after ambulating 37ft. Pt and family very concerned regarding D/C disposition. Discussed disposition at length with pt and family, pt given education regarding various forms of therapy, from CIR, to [place], to SNF. Pt would cont to benefit from skilled acute PT to maximize functional mobility and provide family education to ensure safe D/C to next venue.   Follow Up Recommendations  Supervision/Assistance - 24 hour;CIR     Equipment Recommendations  Rolling walker with 5" wheels;3in1 (PT) (may need O2 ? )    Recommendations for Other Services       Precautions / Restrictions Precautions Precautions: Fall Precaution Comments: monitor O2 sats Restrictions Weight Bearing Restrictions: Yes LLE Weight Bearing: Partial weight bearing LLE Partial Weight Bearing Percentage or Pounds: 50    Mobility  Bed Mobility               General bed mobility comments: pt up in chair and returned to chair  Transfers Overall transfer level: Needs assistance Equipment used: Rolling walker (2 wheeled) Transfers: Sit to/from Stand Sit to Stand: Supervision         General transfer comment: cues for hand placement and safety; no LOB noted   Ambulation/Gait Ambulation/Gait assistance: Min assist Ambulation Distance (Feet): 80 Feet Assistive device: Rolling walker (2 wheeled) Gait Pattern/deviations: Step-to pattern;Decreased step  length - right;Decreased stance time - left;Wide base of support;Trunk flexed Gait velocity: guarded Gait velocity interpretation: Below normal speed for age/gender General Gait Details: pt on 2L O2 with ambulation; at 98% while ambulating; pt demo SOB and incr fatigue with mobility; required multiple standing rest breaks; no LOB noted with mobility; min (A) to steady and manage RW with directional changes only; min cues for [facility] status    Stairs            Wheelchair Mobility    Modified Rankin (Stroke Patients Only)       Balance Overall balance assessment: Needs assistance         Standing balance support: During functional activity;Bilateral upper extremity supported Standing balance-Leahy Scale: Poor Standing balance comment: RW to balance and maintan PWB status                     Cognition Arousal/Alertness: Awake/alert Behavior During Therapy: WFL for tasks assessed/performed Overall Cognitive Status: Within Functional Limits for tasks assessed                      Exercises General Exercises - Lower Extremity Ankle Circles/Pumps: AROM;Both;10 reps;Seated Long Arc Quad: AROM;Strengthening;Both;10 reps;Seated    General Comments General comments (skin integrity, edema, etc.): pt demo SOB and fatigue with mobility; O2 sats at 98% on 2L O2 ; at length discussion regarding D/C planning with family and pt; educated on home setup strategies to enable pt to D/C home with family safely  Pertinent Vitals/Pain Pain Assessment: 0-10 Pain Score: 3  Pain Location: Lt hip  Pain Descriptors / Indicators: Discomfort Pain Intervention(s): Monitored during session;Premedicated before session;Repositioned    Home Living                      Prior Function            PT Goals (current goals can now be found in the care plan section) Acute Rehab PT Goals Patient Stated Goal: to be home for Christmas PT Goal Formulation: With patient Time  For Goal Achievement: 06/12/14 Potential to Achieve Goals: Good Progress towards PT goals: Progressing toward goals    Frequency  Min 5X/week    PT Plan Current plan remains appropriate    Co-evaluation             End of Session Equipment Utilized During Treatment: Gait belt Activity Tolerance: Patient tolerated treatment well Patient left: in chair;with call bell/phone within reach;with family/visitor present     Time: 9798-9211 PT Time Calculation (min) (ACUTE ONLY): 25 min  Charges:  $Gait Training: 8-22 mins $Self Care/Home Management: 8-22                    G CodesMelina [place], [place]  [phone] 06/06/2014, 3:35 PM

## 2014-06-06 NOTE — Clinical Social Work Placement (Signed)
Clinical Social Work Department CLINICAL SOCIAL WORK PLACEMENT NOTE 06/06/2014  Patient:  KYIAN, OBST  Account Number:  [id number] Admit date:  06/03/2014  Clinical Social Worker:  Delrae Sawyers  Date/time:  06/06/2014 03:45 PM  Clinical Social Work is seeking post-discharge placement for this patient at the following level of care:   [place]   (*CSW will update this form in Epic as items are completed)   06/06/2014  Patient/family provided with [facility] Department of Clinical Social Work's list of facilities offering this level of care within the geographic area requested by the patient (or if unable, by the patient's family).  06/06/2014  Patient/family informed of their freedom to choose among providers that offer the needed level of care, that participate in Medicare, Medicaid or managed care program needed by the patient, have an available bed and are willing to accept the patient.  06/06/2014  Patient/family informed of [facility]' ownership interest in [facility], as well as of the fact that they are under no obligation to receive care at this facility.  PASARR submitted to EDS on 06/06/2014 PASARR number received on 06/06/2014  FL2 transmitted to all facilities in geographic area requested by pt/family on  06/06/2014 FL2 transmitted to all facilities within larger geographic area on   Patient informed that his/her managed care company has contracts with or will negotiate with  certain facilities, including the following:     Patient/family informed of bed offers received:   Patient chooses bed at  Physician recommends and patient chooses bed at    Patient to be transferred to  on   Patient to be transferred to facility by  Patient and family notified of transfer on  Name of family member notified:    The following physician request were entered in Epic:   Additional Comments:  [place] ([phone]) Licensed Clinical  Social Worker Orthopedics [phone]) and Surgical [phone])

## 2014-06-06 NOTE — Progress Notes (Signed)
Rehab admissions - I met with pt and his daughter in follow up to rehab MD consult to explain the possibility of inpatient rehab. Information was given about our rehab unit and I explained that we have no available rehab beds today. We are very tight on possible rehab beds tomorrow as well.  At this point, our recommendation is to pursue home with home health or skilled nursing. I discussed this with pt and his dtr and explained that case manager and social worker would be following up.  I updated Manuela Schwartz, case Freight forwarder and Raquel Sarna, Education officer, museum.   Please call me with any questions. Thanks.  Nanetta Batty, PT Rehabilitation Admissions Coordinator [phone]

## 2014-06-07 ENCOUNTER — Inpatient Hospital Stay (HOSPITAL_COMMUNITY)
Admission: RE | Admit: 2014-06-07 | Discharge: 2014-06-12 | DRG: 560 | Disposition: A | Discharge status: Home Health | Payer: Medicare Other | Source: Intra-hospital | Attending: Physical Medicine & Rehabilitation | Admitting: Physical Medicine & Rehabilitation

## 2014-06-07 ENCOUNTER — Inpatient Hospital Stay (HOSPITAL_COMMUNITY): Payer: Medicare Other

## 2014-06-07 DIAGNOSIS — E039 Hypothyroidism, unspecified: Secondary | ICD-10-CM | POA: Diagnosis present

## 2014-06-07 DIAGNOSIS — Z79899 Other long term (current) drug therapy: Secondary | ICD-10-CM

## 2014-06-07 DIAGNOSIS — I10 Essential (primary) hypertension: Secondary | ICD-10-CM | POA: Diagnosis present

## 2014-06-07 DIAGNOSIS — D62 Acute posthemorrhagic anemia: Secondary | ICD-10-CM | POA: Diagnosis present

## 2014-06-07 DIAGNOSIS — Z882 Allergy status to sulfonamides status: Secondary | ICD-10-CM

## 2014-06-07 DIAGNOSIS — Z7952 Long term (current) use of systemic steroids: Secondary | ICD-10-CM | POA: Diagnosis not present

## 2014-06-07 DIAGNOSIS — J439 Emphysema, unspecified: Secondary | ICD-10-CM | POA: Diagnosis present

## 2014-06-07 DIAGNOSIS — S72002D Fracture of unspecified part of neck of left femur, subsequent encounter for closed fracture with routine healing: Secondary | ICD-10-CM | POA: Diagnosis present

## 2014-06-07 DIAGNOSIS — Z87891 Personal history of nicotine dependence: Secondary | ICD-10-CM

## 2014-06-07 DIAGNOSIS — R339 Retention of urine, unspecified: Secondary | ICD-10-CM | POA: Diagnosis present

## 2014-06-07 DIAGNOSIS — E785 Hyperlipidemia, unspecified: Secondary | ICD-10-CM | POA: Diagnosis present

## 2014-06-07 DIAGNOSIS — J449 Chronic obstructive pulmonary disease, unspecified: Secondary | ICD-10-CM | POA: Diagnosis present

## 2014-06-07 DIAGNOSIS — S72002S Fracture of unspecified part of neck of left femur, sequela: Secondary | ICD-10-CM

## 2014-06-07 DIAGNOSIS — S72002A Fracture of unspecified part of neck of left femur, initial encounter for closed fracture: Secondary | ICD-10-CM | POA: Diagnosis present

## 2014-06-07 LAB — BASIC METABOLIC PANEL
Anion gap: 8 (ref 5–15)
BUN: 21 mg/dL (ref 6–23)
CO2: 32 mEq/L (ref 19–32)
Calcium: 9 mg/dL (ref 8.4–10.5)
Chloride: 98 mEq/L (ref 96–112)
Creatinine, Ser: 1.21 mg/dL (ref 0.50–1.35)
GFR calc Af Amer: 62 mL/min — ABNORMAL LOW (ref >90)
GFR calc non Af Amer: 53 mL/min — ABNORMAL LOW (ref >90)
Glucose, Bld: 91 mg/dL (ref 70–99)
Potassium: 4.3 mEq/L (ref 3.7–5.3)
SODIUM: 138 meq/L (ref 137–147)

## 2014-06-07 LAB — URINALYSIS, ROUTINE W REFLEX MICROSCOPIC
Bilirubin Urine: NEGATIVE
Glucose, UA: NEGATIVE mg/dL
HGB URINE DIPSTICK: NEGATIVE
KETONES UR: NEGATIVE mg/dL
Leukocytes, UA: NEGATIVE
Nitrite: NEGATIVE
Protein, ur: NEGATIVE mg/dL
Specific Gravity, Urine: 1.01 (ref 1.005–1.030)
Urobilinogen, UA: 0.2 mg/dL (ref 0.0–1.0)
pH: 6 (ref 5.0–8.0)

## 2014-06-07 LAB — CBC
HCT: 39.7 % (ref 39.0–52.0)
HEMOGLOBIN: 12.8 g/dL — AB (ref 13.0–17.0)
MCH: 30.6 pg (ref 26.0–34.0)
MCHC: 32.2 g/dL (ref 30.0–36.0)
MCV: 95 fL (ref 78.0–100.0)
Platelets: 200 10*3/uL (ref 150–400)
RBC: 4.18 MIL/uL — ABNORMAL LOW (ref 4.22–5.81)
RDW: 13.6 % (ref 11.5–15.5)
WBC: 8.9 10*3/uL (ref 4.0–10.5)

## 2014-06-07 MED ORDER — HYDROCODONE-ACETAMINOPHEN 5-325 MG PO TABS
1.0000 | ORAL_TABLET | ORAL | Status: DC | PRN
  Administered 2014-06-07 – 2014-06-08 (×3): 2 via ORAL
  Filled 2014-06-07 (×3): qty 2

## 2014-06-07 MED ORDER — HYDROCODONE-ACETAMINOPHEN 5-325 MG PO TABS: 1.0000 | ORAL_TABLET | Freq: Four times a day (QID) | ORAL | Status: DC | PRN

## 2014-06-07 MED ORDER — METHOCARBAMOL 500 MG PO TABS: 500.0000 mg | ORAL_TABLET | Freq: Four times a day (QID) | ORAL | Status: DC | PRN

## 2014-06-07 MED ORDER — BISACODYL 10 MG RE SUPP: 10.0000 mg | Freq: Every day | RECTAL | Status: DC | PRN

## 2014-06-07 MED ORDER — IPRATROPIUM-ALBUTEROL 20-100 MCG/ACT IN AERS: 1.0000 | INHALATION_SPRAY | Freq: Four times a day (QID) | RESPIRATORY_TRACT | Status: DC | PRN

## 2014-06-07 MED ORDER — PROCHLORPERAZINE 25 MG RE SUPP
12.5000 mg | Freq: Four times a day (QID) | RECTAL | Status: DC | PRN
  Filled 2014-06-07: qty 1

## 2014-06-07 MED ORDER — ACETAMINOPHEN 325 MG PO TABS: 325.0000 mg | ORAL_TABLET | ORAL | Status: DC | PRN

## 2014-06-07 MED ORDER — FOLIC ACID 1 MG PO TABS
1.0000 mg | ORAL_TABLET | Freq: Every day | ORAL | Status: DC
  Administered 2014-06-08 – 2014-06-12 (×5): 1 mg via ORAL
  Filled 2014-06-07 (×6): qty 1

## 2014-06-07 MED ORDER — PROCHLORPERAZINE MALEATE 5 MG PO TABS
5.0000 mg | ORAL_TABLET | Freq: Four times a day (QID) | ORAL | Status: DC | PRN
  Filled 2014-06-07: qty 2

## 2014-06-07 MED ORDER — IPRATROPIUM-ALBUTEROL 0.5-2.5 (3) MG/3ML IN SOLN: 3.0000 mL | RESPIRATORY_TRACT | Status: DC | PRN

## 2014-06-07 MED ORDER — SENNOSIDES-DOCUSATE SODIUM 8.6-50 MG PO TABS: 2.0000 | ORAL_TABLET | Freq: Two times a day (BID) | ORAL | Status: DC

## 2014-06-07 MED ORDER — ALBUTEROL SULFATE (2.5 MG/3ML) 0.083% IN NEBU: 2.5000 mg | INHALATION_SOLUTION | RESPIRATORY_TRACT | Status: DC | PRN

## 2014-06-07 MED ORDER — TRAZODONE 25 MG HALF TABLET
25.0000 mg | ORAL_TABLET | Freq: Every evening | ORAL | Status: DC | PRN
  Filled 2014-06-07: qty 2

## 2014-06-07 MED ORDER — ALUM & MAG HYDROXIDE-SIMETH 200-200-20 MG/5ML PO SUSP: 30.0000 mL | ORAL | Status: DC | PRN

## 2014-06-07 MED ORDER — TRAZODONE HCL 50 MG PO TABS: 25.0000 mg | ORAL_TABLET | Freq: Every evening | ORAL | Status: DC | PRN

## 2014-06-07 MED ORDER — ENOXAPARIN SODIUM 40 MG/0.4ML ~~LOC~~ SOLN
40.0000 mg | SUBCUTANEOUS | Status: DC
  Administered 2014-06-07 – 2014-06-11 (×5): 40 mg via SUBCUTANEOUS
  Filled 2014-06-07 (×6): qty 0.4

## 2014-06-07 MED ORDER — DIPHENHYDRAMINE HCL 12.5 MG/5ML PO ELIX: 12.5000 mg | ORAL_SOLUTION | Freq: Four times a day (QID) | ORAL | Status: DC | PRN

## 2014-06-07 MED ORDER — ADULT MULTIVITAMIN W/MINERALS CH: 1.0000 | ORAL_TABLET | Freq: Every day | ORAL | Status: DC

## 2014-06-07 MED ORDER — ENOXAPARIN SODIUM 40 MG/0.4ML ~~LOC~~ SOLN: 40.0000 mg | SUBCUTANEOUS | Status: DC

## 2014-06-07 MED ORDER — LEVOTHYROXINE SODIUM 50 MCG PO TABS
50.0000 ug | ORAL_TABLET | Freq: Every day | ORAL | Status: DC
  Filled 2014-06-07: qty 1

## 2014-06-07 MED ORDER — SENNOSIDES-DOCUSATE SODIUM 8.6-50 MG PO TABS: 2.0000 | ORAL_TABLET | Freq: Every day | ORAL | Status: DC

## 2014-06-07 MED ORDER — FLEET ENEMA 7-19 GM/118ML RE ENEM: 1.0000 | ENEMA | Freq: Once | RECTAL | Status: DC | PRN

## 2014-06-07 MED ORDER — ACETAMINOPHEN 325 MG PO TABS
650.0000 mg | ORAL_TABLET | Freq: Four times a day (QID) | ORAL | Status: DC | PRN
Start: 2014-06-07 — End: 2014-06-12

## 2014-06-07 MED ORDER — TIOTROPIUM BROMIDE MONOHYDRATE 18 MCG IN CAPS
18.0000 ug | ORAL_CAPSULE | Freq: Every day | RESPIRATORY_TRACT | Status: DC
  Administered 2014-06-08 – 2014-06-12 (×5): 18 ug via RESPIRATORY_TRACT
  Filled 2014-06-07: qty 5

## 2014-06-07 MED ORDER — MENTHOL 3 MG MT LOZG: 1.0000 | LOZENGE | OROMUCOSAL | Status: DC | PRN

## 2014-06-07 MED ORDER — POLYETHYLENE GLYCOL 3350 17 G PO PACK
17.0000 g | PACK | Freq: Two times a day (BID) | ORAL | Status: DC
  Administered 2014-06-07 – 2014-06-10 (×7): 17 g via ORAL
  Filled 2014-06-07 (×12): qty 1

## 2014-06-07 MED ORDER — GUAIFENESIN-DM 100-10 MG/5ML PO SYRP
5.0000 mL | ORAL_SOLUTION | Freq: Four times a day (QID) | ORAL | Status: DC | PRN
  Filled 2014-06-07: qty 10

## 2014-06-07 MED ORDER — TAB-A-VITE/IRON PO TABS
1.0000 | ORAL_TABLET | Freq: Every day | ORAL | Status: DC
  Administered 2014-06-08 – 2014-06-12 (×5): 1 via ORAL
  Filled 2014-06-07 (×6): qty 1

## 2014-06-07 MED ORDER — FOLIC ACID 1 MG PO TABS: 1.0000 mg | ORAL_TABLET | Freq: Every day | ORAL | Status: DC

## 2014-06-07 MED ORDER — PREDNISONE 2.5 MG PO TABS
2.5000 mg | ORAL_TABLET | Freq: Every day | ORAL | Status: DC
  Filled 2014-06-07: qty 1

## 2014-06-07 MED ORDER — TRAMADOL HCL 50 MG PO TABS: 50.0000 mg | ORAL_TABLET | Freq: Four times a day (QID) | ORAL | Status: DC | PRN

## 2014-06-07 MED ORDER — SIMVASTATIN 5 MG PO TABS
5.0000 mg | ORAL_TABLET | Freq: Every day | ORAL | Status: DC
  Filled 2014-06-07: qty 1

## 2014-06-07 MED ORDER — SIMVASTATIN 5 MG PO TABS
5.0000 mg | ORAL_TABLET | Freq: Every day | ORAL | Status: DC
  Administered 2014-06-07 – 2014-06-11 (×5): 5 mg via ORAL
  Filled 2014-06-07 (×7): qty 1

## 2014-06-07 MED ORDER — BUDESONIDE-FORMOTEROL FUMARATE 160-4.5 MCG/ACT IN AERO
2.0000 | INHALATION_SPRAY | Freq: Two times a day (BID) | RESPIRATORY_TRACT | Status: DC
  Administered 2014-06-07 – 2014-06-12 (×10): 2 via RESPIRATORY_TRACT
  Filled 2014-06-07 (×2): qty 6

## 2014-06-07 MED ORDER — BUDESONIDE-FORMOTEROL FUMARATE 160-4.5 MCG/ACT IN AERO
2.0000 | INHALATION_SPRAY | Freq: Two times a day (BID) | RESPIRATORY_TRACT | Status: DC
  Filled 2014-06-07: qty 6

## 2014-06-07 MED ORDER — HYDROCODONE-ACETAMINOPHEN 5-325 MG PO TABS: 1.0000 | ORAL_TABLET | ORAL | Status: DC | PRN

## 2014-06-07 MED ORDER — LIDOCAINE 5 % EX OINT: 1.0000 "application " | TOPICAL_OINTMENT | CUTANEOUS | Status: DC | PRN

## 2014-06-07 MED ORDER — PHENOL 1.4 % MT LIQD: 1.0000 | OROMUCOSAL | Status: DC | PRN

## 2014-06-07 MED ORDER — GUAIFENESIN-DM 100-10 MG/5ML PO SYRP: 5.0000 mL | ORAL_SOLUTION | Freq: Four times a day (QID) | ORAL | Status: DC | PRN

## 2014-06-07 MED ORDER — VITAMIN B-1 100 MG PO TABS: 100.0000 mg | ORAL_TABLET | Freq: Every day | ORAL | Status: DC

## 2014-06-07 MED ORDER — GUAIFENESIN-DM 100-10 MG/5ML PO SYRP: 5.0000 mL | ORAL_SOLUTION | ORAL | Status: DC | PRN

## 2014-06-07 MED ORDER — TIOTROPIUM BROMIDE MONOHYDRATE 18 MCG IN CAPS
18.0000 ug | ORAL_CAPSULE | Freq: Every day | RESPIRATORY_TRACT | Status: DC
  Filled 2014-06-07: qty 5

## 2014-06-07 MED ORDER — FLEET ENEMA 7-19 GM/118ML RE ENEM: 1.0000 | ENEMA | Freq: Once | RECTAL | Status: AC | PRN

## 2014-06-07 MED ORDER — PROCHLORPERAZINE EDISYLATE 5 MG/ML IJ SOLN
5.0000 mg | Freq: Four times a day (QID) | INTRAMUSCULAR | Status: DC | PRN
  Filled 2014-06-07: qty 2

## 2014-06-07 NOTE — Progress Notes (Signed)
Physical Therapy Treatment Patient Details Name: [name] MRN: [id number] DOB: 04-Mar-1928 Today's Date: 06/07/2014    History of Present Illness [name] is a 80 y.o. male presents with a fractured hip. He states that he was dragging a trash can and slipped and fell. Pt s/p L hip reduction with pinning on 12/15. Pt with hx of COPD, 02 dependent at night, but had limited activity tolerance PTA.    PT Comments    Pt continues to be motivated to progress mobility but is limited by SOB and decr activity tolerance.Pt with incr cough this session and ambulating on 2L O2. Pt planned for D/C to CIR today.   Follow Up Recommendations  Supervision/Assistance - 24 hour;CIR     Equipment Recommendations  Rolling walker with 5" wheels;3in1 (PT)    Recommendations for Other Services       Precautions / Restrictions Precautions Precautions: Fall Precaution Comments: monitor O2 sats Restrictions Weight Bearing Restrictions: Yes LLE Weight Bearing: Partial weight bearing LLE Partial Weight Bearing Percentage or Pounds: 50    Mobility  Bed Mobility                  Transfers Overall transfer level: Needs assistance Equipment used: Rolling walker (2 wheeled) Transfers: Sit to/from Stand Sit to Stand: Min guard         General transfer comment: min guard to steady when transitioning UEs to RW; cues for sequencing and PWB status on Lt LE  Ambulation/Gait Ambulation/Gait assistance: Min guard Ambulation Distance (Feet): 80 Feet Assistive device: Rolling walker (2 wheeled) Gait Pattern/deviations: Step-through pattern;Decreased stride length;Trunk flexed Gait velocity: guarded Gait velocity interpretation: Below normal speed for age/gender General Gait Details: pt ambulating on 2L O2; pt demo SOB with mobility; required 2 standing rest breaks due to fatigue; cues for RW management and min guard to steady with directional changes   Stairs             Wheelchair Mobility    Modified Rankin (Stroke Patients Only)       Balance Overall balance assessment: Needs assistance         Standing balance support: During functional activity;Bilateral upper extremity supported Standing balance-Leahy Scale: Poor Standing balance comment: RW to balance at all times ; slight posterior sway initially with sit to stand                    Cognition Arousal/Alertness: Awake/alert Behavior During Therapy: WFL for tasks assessed/performed Overall Cognitive Status: Within Functional Limits for tasks assessed                      Exercises General Exercises - Lower Extremity Ankle Circles/Pumps: AROM;Both;10 reps;Seated Quad Sets: AROM;Left;10 reps;Seated Long Arc Quad: AROM;Strengthening;Both;10 reps;Seated    General Comments General comments (skin integrity, edema, etc.): O2 sat prior to ambulation at 94%; during ambulation 95%; pt on 2L O2 at all times; denied any nausea this session      Pertinent Vitals/Pain Pain Assessment: No/denies pain Pain Intervention(s): Premedicated before session;Repositioned;Monitored during session    Home Living                      Prior Function            PT Goals (current goals can now be found in the care plan section) Acute Rehab PT Goals Patient Stated Goal: to go to rehab then home PT Goal Formulation: With patient Time For  Goal Achievement: 06/12/14 Potential to Achieve Goals: Good Progress towards PT goals: Progressing toward goals    Frequency  Min 5X/week    PT Plan Current plan remains appropriate    Co-evaluation             End of Session Equipment Utilized During Treatment: Gait belt;Oxygen Activity Tolerance: Patient tolerated treatment well Patient left: in chair;with call bell/phone within reach;with family/visitor present     Time: 4332-9518 PT Time Calculation (min) (ACUTE ONLY): 19 min  Charges:  $Gait Training: 8-22 mins                     G CodesElie Confer [place], [place] 06/07/2014, 2:51 PM

## 2014-06-07 NOTE — Progress Notes (Signed)
Patient ID: [name], male   DOB: 11/08/1927, 80 y.o.   MRN: [id number] Subjective: 3 Days Post-Op Procedure(s) (LRB): LEFT HIP REDUCTION WITH PERCUTANEOUS SCREWS (Left)    Patient reports pain as mild. Out of bed this am eating breakfast  Objective:   VITALS:   Filed Vitals:   06/07/14 0537  BP: 150/62  Pulse: 80  Temp: 98.4 F (36.9 C)  Resp: 24    Neurovascular intact Incision: dressing C/D/I  LABS  Recent Labs  06/04/14 0854 06/05/14 0646  HGB 13.2 12.6*  HCT 40.1 37.6*  WBC 11.6* 10.5  PLT 211 169     Recent Labs  06/05/14 0646  NA 135*  K 4.3  BUN 22  CREATININE 1.08  GLUCOSE 138*    No results for input(s): LABPT, INR in the last 72 hours.   Assessment/Plan: 3 Days Post-Op Procedure(s) (LRB): LEFT HIP REDUCTION WITH PERCUTANEOUS SCREWS (Left)   Discharge to SNF pending further workup PWB LLE

## 2014-06-07 NOTE — Care Management Note (Signed)
CARE MANAGEMENT NOTE 06/07/2014  Patient:  [name], [name]   Account Number:  [id number]  Date Initiated:  06/07/2014  Documentation initiated by:  Ricki Miller  Subjective/Objective Assessment:   80 yr old male admitted with left hip fracture. Patient had a left hip pinning.     Action/Plan:   Patient will go to CIR for shortterm rehab.   Anticipated DC Date:  06/07/2014   Anticipated DC Plan:  IP REHAB FACILITY  In-house referral  Clinical Social Worker      DC Planning Services  CM consult      [facility] Choice  NA   Choice offered to / List presented to:     DME arranged  NA        [place] arranged  NA      Status of service:  Completed, signed off Medicare Important Message given?   (If response is "NO", the following Medicare IM given date fields will be blank) Date Medicare IM given:   Medicare IM given by:  Veronnica Hennings Date Additional Medicare IM given:  06/07/2014 Additional Medicare IM given by:    Discharge Disposition:  IP REHAB FACILITY  Per UR Regulation:  Reviewed for med. necessity/level of care/duration of stay  If discussed at [place] of Stay Meetings, dates discussed:    Comments:

## 2014-06-07 NOTE — Progress Notes (Signed)
Rehab admissions - I am following pt's case and we have an unexpected available rehab bed today. I spoke with Dr. Conley Canal about pt's case and received medical clearance. I then spoke with pt who was very excited for this opportunity to come to inpatient rehab. I also spoke with pt's wife on the phone and she was very supportive. Pt's family will be coming shortly to visit pt.  I updated Manuela Schwartz, case Freight forwarder and Barnett Applebaum, Education officer, museum as well as Therapist, sports. I will complete admission paperwork shortly with pt.  We will admit pt to inpatient rehab later today. Please call me with any questions. Thanks.  Nanetta Batty, PT Rehabilitation Admissions Coordinator [phone]

## 2014-06-07 NOTE — PMR Pre-admission (Signed)
PMR Admission Coordinator Pre-Admission Assessment  Patient: [name] is an 80 y.o., male MRN: [id number] DOB: October 01, 1927 Height: 5\' 10"  (177.8 cm) Weight: 87.091 kg (192 lb)              Insurance Information  PRIMARY: Medicare A & B       Policy#: [id number]      Subscriber: self Pre-Cert#: verified in [employer]: retired Runner, broadcasting/film/video. Date: A: 01-20-95; B: 12-20-95     Deduct: $1260      Out of Pocket Max: none      Life Max: unlimited CIR: 100%      SNF: 100% days 1-20; 80% days 21-100 (100 day visit max) Outpatient: 80%     Co-Pay: 20% Home Health: 100%      Co-Pay: none DME: 80%     Co-Pay: 20% Providers: pt's preference  SECONDARY: AARP      Policy#: [id number]      Subscriber: self Benefits:  Phone #: [phone]       Emergency Contact Information Contact Information    Name Relation Home Work Merom Spouse [phone]  [phone]   Tate,Sarah Daughter   [phone]   Nils Pyle Daughter        Current Medical History  Patient Admitting Diagnosis: left Femoral neck fx  History of Present Illness: [name] is a 80 y.o. male with history of COPD, HTN, who slipped and fell on to his left hip while dragging his trash can on 06/03/14. He had difficulty weight bearing on left leg and was admitted for workup. X rays done revealing subcapital left femoral neck fracture. He was evaluated by Dr. Alvan Dame and underwent closed reduction with pinning left femur on 06/04/14. Post op is PWB LLE and on Lovenox for DVT prophylaxis. He has had difficulty voiding post op as well as ABLA with drop in H/H to 12.6/37.6.  PT evaluation done today and patient limited by SOB with minimal activity. MD/ rehab team recommending CIR.  Past Medical History  Past Medical History  Diagnosis Date  . COPD (chronic obstructive pulmonary disease)   . Hyperlipidemia   . Hypothyroidism   . Pulmonary nodule, right   . Tobacco abuse     Quit 09/17/10    Family  History  family history is not on file.  Prior Rehab/Hospitalizations: Pt had hospitalization for COPD exacerbation and received follow up outpt PT for COPD issues.   Current Medications  Current facility-administered medications: 0.9 %  sodium chloride infusion, , Intravenous, Continuous, Allyne Gee, MD, Last Rate: 50 mL/hr at 06/03/14 2355;  0.9 %  sodium chloride infusion, , Intravenous, Continuous, Mauri Pole, MD, Stopped at 06/06/14 0800;  acetaminophen (TYLENOL) tablet 650 mg, 650 mg, Oral, Q6H PRN **OR** acetaminophen (TYLENOL) suppository 650 mg, 650 mg, Rectal, Q6H PRN, Mauri Pole, MD albuterol (PROVENTIL) (2.5 MG/3ML) 0.083% nebulizer solution 2.5 mg, 2.5 mg, Nebulization, Q4H PRN, Allyne Gee, MD, 2.5 mg at 06/05/14 0007;  alum & mag hydroxide-simeth (MAALOX/MYLANTA) 200-200-20 MG/5ML suspension 30 mL, 30 mL, Oral, Q4H PRN, Mauri Pole, MD, 30 mL at 06/06/14 2117;  budesonide-formoterol (SYMBICORT) 160-4.5 MCG/ACT inhaler 2 puff, 2 puff, Inhalation, BID, Delfina Redwood, MD, 2 puff at 06/07/14 0858 enoxaparin (LOVENOX) injection 40 mg, 40 mg, Subcutaneous, Q24H, Mauri Pole, MD, 40 mg at 94/85/46 2703;  folic acid (FOLVITE) tablet 1 mg, 1 mg, Oral, Daily, Allyne Gee, MD, 1 mg at 06/07/14  0948;  HYDROcodone-acetaminophen (NORCO/VICODIN) 5-325 MG per tablet 1-2 tablet, 1-2 tablet, Oral, Q6H PRN, Mauri Pole, MD, 2 tablet at 06/07/14 0709 levothyroxine (SYNTHROID, LEVOTHROID) tablet 50 mcg, 50 mcg, Oral, QAC breakfast, Allyne Gee, MD, 50 mcg at 06/07/14 0709;  menthol-cetylpyridinium (CEPACOL) lozenge 3 mg, 1 lozenge, Oral, PRN **OR** phenol (CHLORASEPTIC) mouth spray 1 spray, 1 spray, Mouth/Throat, PRN, Mauri Pole, MD methocarbamol (ROBAXIN) tablet 500 mg, 500 mg, Oral, Q6H PRN, 500 mg at 06/06/14 1328 **OR** methocarbamol (ROBAXIN) 500 mg in dextrose 5 % 50 mL IVPB, 500 mg, Intravenous, Q6H PRN, Mauri Pole, MD;  metoCLOPramide (REGLAN) tablet 5-10 mg, 5-10  mg, Oral, Q8H PRN **OR** metoCLOPramide (REGLAN) injection 5-10 mg, 5-10 mg, Intravenous, Q8H PRN, Mauri Pole, MD;  morphine 2 MG/ML injection 0.5 mg, 0.5 mg, Intravenous, Q2H PRN, Mauri Pole, MD multivitamin with minerals tablet 1 tablet, 1 tablet, Oral, Daily, Allyne Gee, MD, 1 tablet at 06/07/14 0948;  ondansetron (ZOFRAN) tablet 4 mg, 4 mg, Oral, Q6H PRN **OR** ondansetron (ZOFRAN) injection 4 mg, 4 mg, Intravenous, Q6H PRN, Mauri Pole, MD, 4 mg at 06/05/14 0940;  polyethylene glycol (MIRALAX / GLYCOLAX) packet 17 g, 17 g, Oral, Daily PRN, Mauri Pole, MD predniSONE (DELTASONE) tablet 2.5 mg, 2.5 mg, Oral, Q breakfast, Allyne Gee, MD, 2.5 mg at 06/07/14 0719;  senna-docusate (Senokot-S) tablet 2 tablet, 2 tablet, Oral, QHS, Delfina Redwood, MD, 2 tablet at 06/06/14 2114;  simvastatin (ZOCOR) tablet 5 mg, 5 mg, Oral, QHS, Allyne Gee, MD, 5 mg at 06/06/14 2114;  thiamine (VITAMIN B-1) tablet 100 mg, 100 mg, Oral, Daily, Allyne Gee, MD, 100 mg at 06/07/14 0948 tiotropium [facility]) inhalation capsule 18 mcg, 18 mcg, Inhalation, Daily, Allyne Gee, MD, 18 mcg at 06/07/14 7680  Patients Current Diet: Diet regular  Precautions / Restrictions Precautions Precautions: Fall Precaution Comments: monitor O2 sats Restrictions Weight Bearing Restrictions: Yes LLE Weight Bearing: Partial weight bearing LLE Partial Weight Bearing Percentage or Pounds: 50   Prior Activity Level Community (5-7x/wk): Pt is active at baseline and got out everyday. He meets friends for coffee three mornings a week at [employer]. 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  Home Assistive Devices / Equipment Home Equipment: None  Prior Functional Level Prior Function Level of Independence: Independent Comments: was on 2LO2 at night  Current Functional Level Cognition  Overall Cognitive Status: Within Functional  Limits for tasks assessed Orientation Level: Oriented X4    Extremity Assessment (includes Sensation/Coordination)          ADLs  Overall ADL's : Needs assistance/impaired Eating/Feeding: Independent Grooming: Oral care, Wash/dry face, Min guard, Standing Upper Body Bathing: Set up, Sitting Lower Body Bathing: Minimal assistance, Sit to/from stand Upper Body Dressing : Set up, Sitting Lower Body Dressing: Moderate assistance, Sit to/from stand Toilet Transfer: Min guard, Ambulation, RW (BSC over toilet) Toilet Transfer Details (indicate cue type and reason): simulated from chair Toileting- Clothing Manipulation and Hygiene: Min guard, Sit to/from stand Functional mobility during ADLs: Minimal assistance, Rolling walker General ADL Comments: Pt ambulated to bathroom for toileting and stood at sink for ~4 minutes for grooming tasks without requiring a rest break to address endurance. Pt then returned to recliner chair.     Mobility  Overal bed mobility: Needs Assistance Bed Mobility: Sit to Supine Supine to sit: Min guard, HOB elevated  Sit to supine: Min guard General bed mobility comments: pt up in chair and returned to chair    Transfers  Overall transfer level: Needs assistance Equipment used: Rolling walker (2 wheeled) Transfers: Sit to/from Stand Sit to Stand: Supervision General transfer comment: cues for hand placement and safety; no LOB noted     Ambulation / Gait / Stairs / Wheelchair Mobility  Ambulation/Gait Ambulation/Gait assistance: Museum/gallery curator (Feet): 80 Feet Assistive device: Rolling walker (2 wheeled) Gait Pattern/deviations: Step-to pattern, Decreased step length - right, Decreased stance time - left, Wide base of support, Trunk flexed Gait velocity: guarded Gait velocity interpretation: Below normal speed for age/gender General Gait Details: pt on 2L O2 with ambulation; at 98% while ambulating; pt demo SOB and incr fatigue with mobility;  required multiple standing rest breaks; no LOB noted with mobility; min (A) to steady and manage RW with directional changes only; min cues for PWB status     Posture / Balance      Special needs/care consideration BiPAP/CPAP no CPM no  Continuous Drip IV no  Dialysis no          Life Vest no  Oxygen - currently on 3L O2 by nasal cannula Special Bed no  Trach Size no  Wound Vac (area) no        Skin - current L hip incision bandaged                               Bowel mgmt: last BM on 06-03-14 Bladder mgmt: using urinal Diabetic mgmt no   Previous Home Environment Living Arrangements: Spouse/significant other Available Help at Discharge: Family, Available 24 hours/day Type of Home: House Home Layout: Two level, Able to live on main level with bedroom/bathroom Home Access: Stairs to enter [employer] of Steps: 1 Bathroom Shower/Tub: Tub/shower unit, Gaffer, Curtain (tub/shower on first floor) Bathroom Toilet: Standard Additional Comments: has a toilet riser, but may not be accessible  Discharge Living Setting Plans for Discharge Living Setting: Patient's home Type of Home at Discharge: House Discharge Home Layout: Two level, Able to live on main level with bedroom/bathroom Alternate Level Stairs-Number of Steps: flight Discharge Home Access: Stairs to enter Entrance Stairs-Number of Steps: 1 Discharge Bathroom Shower/Tub: Tub/shower unit, Walk-in shower (tub/shower on 1st floor) Discharge Bathroom Toilet: Standard Does the patient have any problems obtaining your medications?: No  Social/Family/Support Systems Patient Roles: Spouse, Psychologist, 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 Ind) Caregiver  Availability: 24/7 Discharge Plan Discussed with Primary Caregiver: Yes Is Caregiver In Agreement with Plan?: Yes Does Caregiver/Family have Issues with Lodging/Transportation while Pt is in Rehab?: No    Goals/Additional Needs Patient/Family Goal for Rehab: Mod Ind. with PT/OT, NA for SLP Expected length of stay: 7-9 days Cultural Considerations: pt is involved in his church but no specific cultural considerations Dietary Needs: regular diet Equipment Needs: to be determined Pt/Family Agrees to Admission and willing to participate: Yes (spoke with pt's wife and dtr) Program Orientation Provided & Reviewed with Pt/Caregiver Including Roles  & Responsibilities: Yes   Decrease burden of Care through IP rehab admission: NA  Possible need for SNF placement upon discharge: not anticipated  Patient Condition: This patient's condition remains as documented  in the consult dated 06-06-14, in which the Rehabilitation Physician determined and documented that the patient's condition is appropriate for intensive rehabilitative care in an inpatient rehabilitation facility. Will admit to inpatient rehab today.  Preadmission Screen Completed By:  Nanetta Batty, PT, 06/07/2014 10:50 AM ______________________________________________________________________   Discussed status with Dr. Naaman Plummer on 06-07-14 at 1040 and received telephone approval for admission today.  Admission Coordinator:  Nanetta Batty, PT, time 1040/Date 06-07-14

## 2014-06-07 NOTE — Interval H&P Note (Signed)
[name] was admitted today to Inpatient Rehabilitation with the diagnosis of left femoral neck fracture.  The patient's history has been reviewed, patient examined, and there is no change in status.  Patient continues to be appropriate for intensive inpatient rehabilitation.  I have reviewed the patient's chart and labs.  Questions were answered to the patient's satisfaction.  SWARTZ,ZACHARY T 06/07/2014, 8:37 PM

## 2014-06-07 NOTE — Progress Notes (Signed)
Meredith Staggers, MD Physician Signed Physical Medicine and Rehabilitation Consult Note 06/05/2014 3:50 PM  Related encounter: ED to Hosp-Admission (Discharged) from 06/03/2014 in [facility] Collapse All        Physical Medicine and Rehabilitation Consult   Reason for Consult: Left femur fracture.  Referring Physician: Dr. Conley Canal.    HPI: [name] is a 80 y.o. male with history of COPD, HTN, who slipped and fell on to his left hip while dragging his trash can on 06/03/14. He had difficulty weight bearing on left leg and was admitted for workup. X rays done revealing subcapital left femoral neck fracture. He was evaluated by Dr. Alvan Dame and underwent closed reduction with pinning left femur on 06/04/14. Post op is PWB LLE and on Lovenox for DVT prophylaxis. He has had difficulty voiding post op as well as ABLA with drop in H/H to 12.6/37.6. PT evaluation done today and patient limited by SOB with minimal activity. MD/ rehab team recommending CIR.    Review of Systems  HENT: Negative for hearing loss.  Eyes: Negative for blurred vision and double vision.  Respiratory: Positive for cough and shortness of breath (with minimal acitivity--paces himself).  Cardiovascular: Negative for chest pain and palpitations.  Gastrointestinal: Negative for heartburn, nausea, abdominal pain and constipation.  Genitourinary: Negative for urgency and frequency.  Musculoskeletal: Positive for joint pain.  Neurological: Negative for headaches.  Psychiatric/Behavioral: The patient is not nervous/anxious and does not have insomnia.      Past Medical History  Diagnosis Date  . COPD (chronic obstructive pulmonary disease)   . Hyperlipidemia   . Hypothyroidism   . Pulmonary nodule, right   . Tobacco abuse     Quit 09/17/10    Past Surgical History  Procedure Laterality Date  . Tracheostomy      after  aspiration event as a 80 y/o  . Electronavigational bronchoscopy  07/2010    History reviewed. No pertinent family history.    Social History: Married. Retired [employer] for [employer]. He reports that he quit smoking about 3 years ago. His smoking use included Cigarettes. He has a 55 pack-year smoking history. He has never used smokeless tobacco. He reports that he does not drink alcohol. His drug history is not on file.    Allergies  Allergen Reactions  . Sulfonamide Derivatives     unknown     Medications Prior to Admission  Medication Sig Dispense Refill  . albuterol (VENTOLIN HFA) 108 (90 BASE) MCG/ACT inhaler Inhale 2 puffs into the lungs every 4 (four) hours as needed for shortness of breath.     . budesonide-formoterol (SYMBICORT) 160-4.5 MCG/ACT inhaler Inhale 2 puffs into the lungs 2 (two) times daily. 1 Inhaler 5  . levothyroxine (SYNTHROID, LEVOTHROID) 50 MCG tablet Take 50 mcg by mouth daily.    . predniSONE (DELTASONE) 5 MG tablet Take 0.5 tablets (2.5 mg total) by mouth daily with breakfast. 30 tablet 3  . simvastatin (ZOCOR) 5 MG tablet Take 5 mg by mouth at bedtime.     Marland Kitchen tiotropium (SPIRIVA HANDIHALER) 18 MCG inhalation capsule Place 1 capsule (18 mcg total) into inhaler and inhale daily. 30 capsule 5    Home: Home Living Family/patient expects to be discharged to:: Private residence Living Arrangements: Spouse/significant other Available Help at Discharge: Family, Available 24 hours/day Type of Home: House Home Access: Stairs to enter [employer] of Steps: 1 Home Layout: One level Home Equipment:  None  Functional History: Prior Function Level of Independence: Independent Comments: was on 2LO2 at night Functional Status:  Mobility: Bed Mobility Overal bed mobility: Needs Assistance Bed Mobility: Supine to Sit Supine to sit: Min guard, HOB elevated General bed mobility comments: pt able  to manage LEs indep. Transfers Overall transfer level: Needs assistance Equipment used: Rolling walker (2 wheeled) Transfers: Sit to/from Stand Sit to Stand: Min guard General transfer comment: v/c's for hand placement Ambulation/Gait Ambulation/Gait assistance: Min assist Ambulation Distance (Feet): 20 Feet Assistive device: Rolling walker (2 wheeled) Gait Pattern/deviations: Step-to pattern, Antalgic Gait velocity interpretation: Below normal speed for age/gender General Gait Details: +SOB, SPo2 at 82% on 2LO2 via Branch, tolerance limited by SOB    ADL:    Cognition: Cognition Overall Cognitive Status: Within Functional Limits for tasks assessed Orientation Level: Oriented X4 Cognition Arousal/Alertness: Awake/alert Behavior During Therapy: WFL for tasks assessed/performed Overall Cognitive Status: Within Functional Limits for tasks assessed  Blood pressure 137/54, pulse 66, temperature 97.5 F (36.4 C), temperature source Oral, resp. rate 24, height 5\' 10"  (1.778 m), weight 87.091 kg (192 lb), SpO2 96 %. Physical Exam  Nursing note and vitals reviewed. Constitutional: He is oriented to person, place, and time. He appears well-developed and well-nourished.  HENT:  Head: Normocephalic and atraumatic.  Eyes: Conjunctivae are normal. Pupils are equal, round, and reactive to light.  Neck: Normal range of motion. Neck supple.  Cardiovascular: Normal rate and regular rhythm.  Respiratory: Effort normal and breath sounds normal. No respiratory distress. He has no wheezes.  Wearing oxygen  GI: Soft. Bowel sounds are normal. He exhibits no distension. There is no tenderness.  Musculoskeletal:  Moves BUE and RLE without difficulty. LLE with minimal discomfort with activity.  Neurological: He is alert and oriented to person, place, and time.  Speech clear. Follows commands without difficulty. UE's 4+/5. RLE 3+HF, 4/5 knee and ankle. Left lower ext limited by pain.  Skin: Skin is  warm and dry.  Psychiatric: He has a normal mood and affect. His behavior is normal. Judgment and thought content normal.     Lab Results Last 24 Hours    Results for orders placed or performed during the hospital encounter of 06/03/14 (from the past 24 hour(s))  CBC Status: Abnormal   Collection Time: 06/05/14 6:46 AM  Result Value Ref Range   WBC 10.5 4.0 - 10.5 K/uL   RBC 3.97 (L) 4.22 - 5.81 MIL/uL   Hemoglobin 12.6 (L) 13.0 - 17.0 g/dL   HCT 37.6 (L) 39.0 - 52.0 %   MCV 94.7 78.0 - 100.0 fL   MCH 31.7 26.0 - 34.0 pg   MCHC 33.5 30.0 - 36.0 g/dL   RDW 13.2 11.5 - 15.5 %   Platelets 169 150 - 400 K/uL  Basic metabolic panel Status: Abnormal   Collection Time: 06/05/14 6:46 AM  Result Value Ref Range   Sodium 135 (L) 137 - 147 mEq/L   Potassium 4.3 3.7 - 5.3 mEq/L   Chloride 99 96 - 112 mEq/L   CO2 25 19 - 32 mEq/L   Glucose, Bld 138 (H) 70 - 99 mg/dL   BUN 22 6 - 23 mg/dL   Creatinine, Ser 1.08 0.50 - 1.35 mg/dL   Calcium 8.5 8.4 - 10.5 mg/dL   GFR calc non Af Amer 61 (L) >90 mL/min   GFR calc Af Amer 71 (L) >90 mL/min   Anion gap 11 5 - 15      Imaging Results (Last  48 hours)    Dg Chest 1 View  06/03/2014 CLINICAL DATA: Fall. EXAM: CHEST - 1 VIEW COMPARISON: Oct 26, 2013. FINDINGS: The heart size and mediastinal contours are within normal limits. Both lungs are clear. No pneumothorax or pleural effusion is noted. The visualized skeletal structures are unremarkable. IMPRESSION: No acute cardiopulmonary abnormality seen. Electronically Signed By: Sabino Dick M.D. On: 06/03/2014 21:33   Dg Hip Complete Left  06/03/2014 CLINICAL DATA: Fall. Left hip pain. EXAM: LEFT HIP - COMPLETE 2+ VIEW COMPARISON: None FINDINGS: Abnormal flat and irregular appearance of the junction of the left femoral head and neck suspicious for subtle fracture. Spurring of the  acetabula. IMPRESSION: Nondisplaced subcapital fracture of the left femoral neck.  Electronically Signed By: Sherryl Barters M.D. On: 06/03/2014 21:33   Dg Hip Operative Left  06/04/2014 CLINICAL DATA: Status post ORIF of left hip fracture. EXAM: OPERATIVE LEFT HIP COMPARISON: 06/03/2014. FINDINGS: Two intraoperative fluoroscopic spot views of the left hip demonstrate interval placement of what appear to be 4 cannulated fixation screws traversing the previously noted subcapital femoral neck fracture. Anatomic alignment appears preserved. IMPRESSION: 1. Intraoperative documentation of ORIF for left subcapital femoral neck fracture, as above. Electronically Signed By: Vinnie Langton M.D. On: 06/04/2014 18:59   Dg Femur Left  06/03/2014 CLINICAL DATA: Fall. Left hip pain. EXAM: LEFT FEMUR - 2 VIEW COMPARISON: 06/03/2014 hip radiographs FINDINGS: Osteoarthritis of the knee with medial compartmental articular space narrowing. Vascular calcifications are present. Mild spurring along the articular margin of the patella. No fracture in the distal femur identified; the proximal femur is assessed on the hip radiographs. IMPRESSION: 1. No mid or distal femoral fracture is evident. Proximal femur assessed on hip radiographs. 2. Osteoarthritis of the knee. 3. Atherosclerosis. Electronically Signed By: Sherryl Barters M.D. On: 06/03/2014 21:32     Assessment/Plan: Diagnosis: left Femoral neck fx 1. Does the need for close, 24 hr/day medical supervision in concert with the patient's rehab needs make it unreasonable for this patient to be served in a less intensive setting? Yes 2. Co-Morbidities requiring supervision/potential complications: copd, hypoxia,  3. Due to bladder management, bowel management, safety, skin/wound care, disease management, medication administration, pain management and patient education, does the patient require 24 hr/day rehab nursing? Yes 4. Does  the patient require coordinated care of a physician, rehab nurse, PT (1-2 hrs/day, 5 days/week) and OT (1-2 hrs/day, 5 days/week) to address physical and functional deficits in the context of the above medical diagnosis(es)? Yes Addressing deficits in the following areas: balance, endurance, locomotion, strength, transferring, bowel/bladder control, bathing, dressing, feeding, grooming, toileting and psychosocial support 5. Can the patient actively participate in an intensive therapy program of at least 3 hrs of therapy per day at least 5 days per week? Yes 6. The potential for patient to make measurable gains while on inpatient rehab is excellent 7. Anticipated functional outcomes upon discharge from inpatient rehab are modified independent with PT, modified independent with OT, n/a with SLP. 8. Estimated rehab length of stay to reach the above functional goals is: 7-9 days 9. Does the patient have adequate social supports and living environment to accommodate these discharge functional goals? Yes 10. Anticipated D/C setting: Home 11. Anticipated post D/C treatments: HH therapy and Outpatient therapy 12. Overall Rehab/Functional Prognosis: excellent  RECOMMENDATIONS: This patient's condition is appropriate for continued rehabilitative care in the following setting: CIR Patient has agreed to participate in recommended program. Yes Note that insurance prior authorization may be required for reimbursement for recommended care.  Comment: Rehab Admissions Coordinator to follow up.  Thanks,  Meredith Staggers, MD, [facility]     06/05/2014       Revision History     Date/Time User Provider Type Action   06/06/2014 9:43 AM Meredith Staggers, MD Physician Sign   06/05/2014 4:54 PM Bary Leriche, PA-C Physician Assistant Share   View Details Report       Routing History     Date/Time From To Method   06/06/2014 9:43 AM Meredith Staggers, MD Meredith Staggers, MD In Basket

## 2014-06-07 NOTE — H&P (View-Only) (Signed)
Physical Medicine and Rehabilitation Admission H&P    Chief Complaint  Patient presents with  . Left hip fracture.     HPI: [name] is a 80 y.o. male with history of COPD, HTN, who slipped and fell on to his left hip while dragging his trash can on 06/03/14. He had difficulty weight bearing on left leg and was admitted for workup. X rays done revealing subcapital left femoral neck fracture. He was evaluated by Dr. Alvan Dame and underwent closed reduction with pinning left femur on 06/04/14. Post op is PWB LLE and on Lovenox for DVT prophylaxis. He has had difficulty voiding post op as well as ABLA with drop in H/H to 12.6/37.6. Therapy ongoing and patient limited by acute on chronic SOB with minimal activity. MD/ rehab team recommending CIR and patient admitted today.    ROS    Past Medical History  Diagnosis Date  . COPD (chronic obstructive pulmonary disease)   . Hyperlipidemia   . Hypothyroidism   . Pulmonary nodule, right   . Tobacco abuse     Quit 09/17/10    Past Surgical History  Procedure Laterality Date  . Tracheostomy      after aspiration event as a 80 y/o  . Electronavigational bronchoscopy  07/2010    History reviewed. No pertinent family history.    Social History:  Married. Retired [employer] for [employer]. He reports that he quit smoking about 3 years ago. His smoking use included Cigarettes. He has a 55 pack-year smoking history. He has never used smokeless tobacco. He reports that he does not drink alcohol. His drug history is not on file.    Allergies  Allergen Reactions  . Sulfonamide Derivatives     unknown    Medications Prior to Admission  Medication Sig Dispense Refill  . albuterol (VENTOLIN HFA) 108 (90 BASE) MCG/ACT inhaler Inhale 2 puffs into the lungs every 4 (four) hours as needed for shortness of breath.     . budesonide-formoterol (SYMBICORT) 160-4.5 MCG/ACT inhaler Inhale 2 puffs into the lungs 2 (two) times daily. 1 Inhaler 5    . levothyroxine (SYNTHROID, LEVOTHROID) 50 MCG tablet Take 50 mcg by mouth daily.    . predniSONE (DELTASONE) 5 MG tablet Take 0.5 tablets (2.5 mg total) by mouth daily with breakfast. 30 tablet 3  . simvastatin (ZOCOR) 5 MG tablet Take 5 mg by mouth at bedtime.      Marland Kitchen tiotropium (SPIRIVA HANDIHALER) 18 MCG inhalation capsule Place 1 capsule (18 mcg total) into inhaler and inhale daily. 30 capsule 5    Home: Home Living Family/patient expects to be discharged to:: Private residence Living Arrangements: Spouse/significant other Available Help at Discharge: Family, Available 24 hours/day Type of Home: House Home Access: Stairs to enter [employer] of Steps: 1 Home Layout: Two level, Able to live on main level with bedroom/bathroom Home Equipment: None Additional Comments: has a toilet riser, but may not be accessible   Functional History: Prior Function Level of Independence: Independent Comments: was on 2LO2 at night  Functional Status:  Mobility: Bed Mobility Overal bed mobility: Needs Assistance Bed Mobility: Sit to Supine Supine to sit: Min guard, HOB elevated Sit to supine: Min guard General bed mobility comments: pt up in chair and returned to chair Transfers Overall transfer level: Needs assistance Equipment used: Rolling walker (2 wheeled) Transfers: Sit to/from Stand Sit to Stand: Supervision General transfer comment: cues for hand placement and safety; no LOB noted  Ambulation/Gait Ambulation/Gait assistance: [employer]  assist Ambulation Distance (Feet): 80 Feet Assistive device: Rolling walker (2 wheeled) Gait Pattern/deviations: Step-to pattern, Decreased step length - right, Decreased stance time - left, Wide base of support, Trunk flexed Gait velocity: guarded Gait velocity interpretation: Below normal speed for age/gender General Gait Details: pt on 2L O2 with ambulation; at 98% while ambulating; pt demo SOB and incr fatigue with mobility; required  multiple standing rest breaks; no LOB noted with mobility; min (A) to steady and manage RW with directional changes only; min cues for PWB status     ADL: ADL Overall ADL's : Needs assistance/impaired Eating/Feeding: Independent Grooming: Oral care, Wash/dry face, Min guard, Standing Upper Body Bathing: Set up, Sitting Lower Body Bathing: Minimal assistance, Sit to/from stand Upper Body Dressing : Set up, Sitting Lower Body Dressing: Moderate assistance, Sit to/from stand Toilet Transfer: Min guard, Ambulation, RW (BSC over toilet) Toilet Transfer Details (indicate cue type and reason): simulated from chair Toileting- Clothing Manipulation and Hygiene: Min guard, Sit to/from stand Functional mobility during ADLs: Minimal assistance, Rolling walker General ADL Comments: Pt ambulated to bathroom for toileting and stood at sink for ~4 minutes for grooming tasks without requiring a rest break to address endurance. Pt then returned to recliner chair.   Cognition: Cognition Overall Cognitive Status: Within Functional Limits for tasks assessed Orientation Level: Oriented X4 Cognition Arousal/Alertness: Awake/alert Behavior During Therapy: WFL for tasks assessed/performed Overall Cognitive Status: Within Functional Limits for tasks assessed   Blood pressure 150/62, pulse 80, temperature 98.4 F (36.9 C), temperature source Oral, resp. rate 24, height 5\' 10"  (1.778 m), weight 87.091 kg (192 lb), SpO2 98 %. Physical Exam Nursing note and vitals reviewed. Constitutional: He is oriented to person, place, and time. He appears well-developed and well-nourished.  HENT: oral mucosa pink/moist Head: Normocephalic and atraumatic.  Eyes: Conjunctivae are normal. Pupils are equal, round, and reactive to light.  Neck: Normal range of motion. Neck supple.  Cardiovascular: Normal rate and regular rhythm.no murmur  Respiratory: Effort normal and breath sounds normal. No respiratory distress. He has  no wheezes.  Wearing oxygen  GI: Soft. Bowel sounds are normal. He exhibits no distension. There is no tenderness.  Musculoskeletal:  Moves BUE and RLE without difficulty. LLE with pain during passive ROM. Some swelling around incisions  Neurological: He is alert and oriented to person, place, and time.  Speech clear. Follows commands without difficulty. UE's 4+/5 prox to distal. RLE 3+HF, 4/5 knee and ankle. Left lower (pain) 1/5 hf, 2/5 kne and 4/5 adf/apf Skin: Skin is warm and dry.  Psychiatric: He has a normal mood and affect. His behavior is normal. Judgment and thought content normal.     No results found for this or any previous visit (from the past 48 hour(s)). No results found.     Medical Problem List and Plan: 1. Functional deficits secondary to Left femoral neck fracture s/p closed reduction/pinnnin 2.  DVT Prophylaxis/Anticoagulation: Pharmaceutical: Lovenox 3. Pain Management: hydrocodone and robaxin prn 4. Mood: pt in good spirits. Team to provide egosupport 5. Neuropsych: This patient is capabable of making decisions on his own behalf. 6. Skin/Wound Care: local care to incision. Decubitus precautions. Encourage appropriate intake 7. Fluids/Electrolytes/Nutrition: encourage po, follow up labwork on admit 8. COPD: spiriva, oxygen as needed 9. ABLA: counts fairly robust- vitamin/dietary sources 10. HTN: normotensive.  11. Urinary retention: pvr's low. I/o cath prn. Seems to be voiding fairly well over the last 48 hours      Post Admission Physician Evaluation: 1.  Functional deficits secondary  to Left femoral neck fracture.  2. Patient is admitted to receive collaborative, interdisciplinary care between the physiatrist, rehab nursing staff, and therapy team. 3. Patient's level of medical complexity and substantial therapy needs in context of that medical necessity cannot be provided at a lesser intensity of care such as a SNF. 4. Patient has experienced  substantial functional loss from his/her baseline which was documented above under the "Functional History" and "Functional Status" headings.  Judging by the patient's diagnosis, physical exam, and functional history, the patient has potential for functional progress which will result in measurable gains while on inpatient rehab.  These gains will be of substantial and practical use upon discharge  in facilitating mobility and self-care at the household level. 5. Physiatrist will provide 24 hour management of medical needs as well as oversight of the therapy plan/treatment and provide guidance as appropriate regarding the interaction of the two. 6. 24 hour rehab nursing will assist with bladder management, bowel management, safety, skin/wound care, disease management, medication administration, pain management and patient education  and help integrate therapy concepts, techniques,education, etc. 7. PT will assess and treat for/with: Lower extremity strength, range of motion, stamina, balance, functional mobility, safety, adaptive techniques and equipment, pain mgt, ortho precautions, pt/family ed.   Goals are: mod I. 8. OT will assess and treat for/with: ADL's, functional mobility, safety, upper extremity strength, adaptive techniques and equipment, pain mgt, ortho precautions, pt/family ed.   Goals are: mod I. Therapy may proceed with showering this patient if wound is covered 9. SLP will assess and treat for/with: n/a.  Goals are: n/a. 10. Case Management and Social Worker will assess and treat for psychological issues and discharge planning. 11. Team conference will be held weekly to assess progress toward goals and to determine barriers to discharge. 12. Patient will receive at least 3 hours of therapy per day at least 5 days per week. 13. ELOS: 7 days       14. Prognosis:  excellent     Meredith Staggers, MD, [facility] 06/07/2014   06/07/2014

## 2014-06-07 NOTE — Discharge Summary (Signed)
Physician Discharge Summary  [name] YDX:[id number] DOB: 18-Jul-1927 DOA: 06/03/2014  PCP: [name], MD  Admit date: 06/03/2014 Discharge date: 06/07/2014  Time spent: greater than 30 minutes  Recommendations for Outpatient Follow-up:  1. To inpatient rehab  Discharge Diagnoses:  Principal Problem:   Closed left hip fracture Active Problems:   Chronic respiratory failure with hypoxia   COPD (chronic obstructive pulmonary disease)   Hyperlipidemia   Hypothyroidism  Discharge Condition: stable  Filed Weights   06/03/14 2010  Weight: 87.091 kg (192 lb)    History of present illness:  80 y.o. male presents with a fractured hip. He states that he was dragging a trash can and slipped and fell. He did not have any dizziness or headaches. He had no chest pain. He was able to stand and get himself into the house. He states that he was not able to bear weight on the side. He does have COPD for last 20 years. Patient states that he does not smoke. He does not have any heart disease. He does not recall passing out. Right now he is comfortable other than pain.  Hospital Course:  Admitted to hospitalists, orthopedics and pulmonary consulting.  Principal Problem:  Closed left hip fracture: S/p reduction and percutaneous screws by Dr. Alvan Dame. Pulmonary medicine followed perioperatively and ordered stress dose steroids.  Patient has done well and will go to inpatient rehab unit.  Dr. Alvan Dame recommends ASA 325 mg bid for a month for DVT prophylaxis.  Remains on bowel regimen   Chronic respiratory failure with hypoxia: on home O2, mainly at night has remained stable without exacerbation. Has chronic cough which is unchanged from baseline.    Hypothyroidism: continue synthroid  Chronic steroids: continue prednisone 2.5 mg daily  Consultants:  Ortho  Pulmonary  PM&R  Discharge Exam: Filed Vitals:   06/07/14 0537  BP: 150/62  Pulse: 80  Temp: 98.4 F (36.9 C)  Resp:  24    General: in chair. Occasional cough. oriented Cardiovascular: RRR without MGR Respiratory: diminished throughout without WRR Abdomen: S, NT, ND Ext no CCE   Discharge Instructions    Diet general    Complete by:  As directed      Partial weight bearing    Complete by:  As directed   50%          Current Discharge Medication List    START taking these medications   Details  acetaminophen (TYLENOL) 325 MG tablet Take 2 tablets (650 mg total) by mouth every 6 (six) hours as needed for mild pain (or Fever >/= 101).    albuterol (PROVENTIL) (2.5 MG/3ML) 0.083% nebulizer solution Take 3 mLs (2.5 mg total) by nebulization every 4 (four) hours as needed for wheezing or shortness of breath. Qty: 75 mL, Refills: 12    aspirin EC 325 MG tablet Take 1 tablet (325 mg total) by mouth 2 (two) times daily. Take for 4 weeks Qty: 60 tablet, Refills: 0    guaiFENesin-dextromethorphan (ROBITUSSIN DM) 100-10 MG/5ML syrup Take 5 mLs by mouth every 4 (four) hours as needed for cough. Qty: 118 mL, Refills: 0    HYDROcodone-acetaminophen (NORCO) 5-325 MG per tablet Take 1-2 tablets by mouth every 6 (six) hours as needed. Qty: 60 tablet, Refills: 0    lidocaine (XYLOCAINE) 5 % ointment Apply 1 application topically as needed. For in and out caths Qty: 35.44 g, Refills: 0    methocarbamol (ROBAXIN) 500 MG tablet Take 1 tablet (500 mg total) by  mouth every 6 (six) hours as needed for muscle spasms.    senna-docusate (SENOKOT-S) 8.6-50 MG per tablet Take 2 tablets by mouth at bedtime.      CONTINUE these medications which have NOT CHANGED   Details  albuterol (VENTOLIN HFA) 108 (90 BASE) MCG/ACT inhaler Inhale 2 puffs into the lungs every 4 (four) hours as needed for shortness of breath.     budesonide-formoterol (SYMBICORT) 160-4.5 MCG/ACT inhaler Inhale 2 puffs into the lungs 2 (two) times daily. Qty: 1 Inhaler, Refills: 5    levothyroxine (SYNTHROID, LEVOTHROID) 50 MCG tablet Take 50  mcg by mouth daily.    predniSONE (DELTASONE) 5 MG tablet Take 0.5 tablets (2.5 mg total) by mouth daily with breakfast. Qty: 30 tablet, Refills: 3    simvastatin (ZOCOR) 5 MG tablet Take 5 mg by mouth at bedtime.      tiotropium (SPIRIVA HANDIHALER) 18 MCG inhalation capsule Place 1 capsule (18 mcg total) into inhaler and inhale daily. Qty: 30 capsule, Refills: 5       Allergies  Allergen Reactions  . Sulfonamide Derivatives     unknown   Follow-up Information    Follow up with Mauri Pole, MD. Schedule an appointment as soon as possible for a visit in 2 weeks.   Specialty:  Orthopedic Surgery   Contact information:   [address] [place] [address] [phone]        The results of significant diagnostics from this hospitalization (including imaging, microbiology, ancillary and laboratory) are listed below for reference.    Significant Diagnostic Studies: Dg Chest 1 View  06/03/2014   CLINICAL DATA:  Fall.  EXAM: CHEST - 1 VIEW  COMPARISON:  Oct 26, 2013.  FINDINGS: The heart size and mediastinal contours are within normal limits. Both lungs are clear. No pneumothorax or pleural effusion is noted. The visualized skeletal structures are unremarkable.  IMPRESSION: No acute cardiopulmonary abnormality seen.   Electronically Signed   By: Sabino Dick M.D.   On: 06/03/2014 21:33   Dg Hip Complete Left  06/03/2014   CLINICAL DATA:  Fall.  Left hip pain.  EXAM: LEFT HIP - COMPLETE 2+ VIEW  COMPARISON:  None  FINDINGS: Abnormal flat and irregular appearance of the junction of the left femoral head and neck suspicious for subtle fracture.  Spurring of the acetabula.  IMPRESSION: Nondisplaced subcapital fracture of the left femoral neck.   Electronically Signed   By: Sherryl Barters M.D.   On: 06/03/2014 21:33   Dg Hip Operative Left  06/04/2014   CLINICAL DATA:  Status post ORIF of left hip fracture.  EXAM: OPERATIVE LEFT HIP  COMPARISON:  06/03/2014.   FINDINGS: Two intraoperative fluoroscopic spot views of the left hip demonstrate interval placement of what appear to be 4 cannulated fixation screws traversing the previously noted subcapital femoral neck fracture. Anatomic alignment appears preserved.  IMPRESSION: 1. Intraoperative documentation of ORIF for left subcapital femoral neck fracture, as above.   Electronically Signed   By: Vinnie Langton M.D.   On: 06/04/2014 18:59   Dg Femur Left  06/03/2014   CLINICAL DATA:  Fall.  Left hip pain.  EXAM: LEFT FEMUR - 2 VIEW  COMPARISON:  06/03/2014 hip radiographs  FINDINGS: Osteoarthritis of the knee with medial compartmental articular space narrowing. Vascular calcifications are present. Mild spurring along the articular margin of the patella. No fracture in the distal femur identified; the proximal femur is assessed on the hip radiographs.  IMPRESSION: 1. No  mid or distal femoral fracture is evident. Proximal femur assessed on hip radiographs. 2. Osteoarthritis of the knee. 3. Atherosclerosis.   Electronically Signed   By: Sherryl Barters M.D.   On: 06/03/2014 21:32    Microbiology: Recent Results (from the past 240 hour(s))  Surgical pcr screen     Status: None   Collection Time: 06/04/14  9:13 AM  Result Value Ref Range Status   MRSA, PCR NEGATIVE NEGATIVE Final   Staphylococcus aureus NEGATIVE NEGATIVE Final    Comment:        The Xpert SA Assay (FDA approved for NASAL specimens in patients over 11 years of age), is one component of a comprehensive surveillance program.  Test performance has been validated by [employer] for patients greater than or equal to 63 year old. It is not intended to diagnose infection nor to guide or monitor treatment.      Labs: Basic Metabolic Panel:  Recent Labs Lab 06/03/14 2030 06/04/14 0444 06/05/14 0646  NA 140 140 135*  K 5.2 4.8 4.3  CL 102 103 99  CO2 26 25 25   GLUCOSE 152* 129* 138*  BUN 18 21 22   CREATININE 1.21 1.19 1.08   CALCIUM 9.2 8.6 8.5   Liver Function Tests:  Recent Labs Lab 06/04/14 0444  AST 17  ALT 14  ALKPHOS 77  BILITOT 0.8  PROT 6.5  ALBUMIN 3.3*   No results for input(s): LIPASE, AMYLASE in the last 168 hours. No results for input(s): AMMONIA in the last 168 hours. CBC:  Recent Labs Lab 06/03/14 2030 06/04/14 0854 06/05/14 0646  WBC 20.7* 11.6* 10.5  NEUTROABS 18.2*  --   --   HGB 13.1 13.2 12.6*  HCT 39.2 40.1 37.6*  MCV 82.7 92.6 94.7  PLT 260 211 169   Cardiac Enzymes: No results for input(s): CKTOTAL, CKMB, CKMBINDEX, TROPONINI in the last 168 hours. BNP: BNP (last 3 results) No results for input(s): PROBNP in the last 8760 hours. CBG: No results for input(s): GLUCAP in the last 168 hours.     SignedDelfina Redwood  [facility] 06/07/2014, 11:34 AM

## 2014-06-07 NOTE — Progress Notes (Signed)
[facility] Admission Coordinator Signed Physical Medicine and Rehabilitation PMR Pre-admission 06/07/2014 10:40 AM  Related encounter: ED to Hosp-Admission (Discharged) from 06/03/2014 in [facility] Collapse All   PMR Admission Coordinator Pre-Admission Assessment  Patient: [name] is an 80 y.o., male MRN: [id number] DOB: [birth year] Height: 5\' 10"  (177.8 cm) Weight: 87.091 kg (192 lb)  Insurance Information  PRIMARY: Medicare A & B Policy#: [id number] a Subscriber: self Pre-Cert#: verified in [employer]: retired Runner, broadcasting/film/video. Date: A: 01-20-95; B: 12-20-95 Deduct: $1260 Out of Pocket Max: none Life Max: unlimited CIR: 100% SNF: 100% days 1-20; 80% days 21-100 (100 day visit max) Outpatient: 80% Co-Pay: 20% Home Health: 100% Co-Pay: none DME: 80% Co-Pay: 20% Providers: pt's preference  SECONDARY: AARP Policy#: [id number] Subscriber: self Benefits: Phone #: [phone]   Emergency Contact Information Contact Information    Name Relation Home Work [place] Spouse [phone]  [phone]   Tate,Sarah Daughter   [phone]   Nils Pyle Daughter        Current Medical History  Patient Admitting Diagnosis: left Femoral neck fx  History of Present Illness: [name] is a 80 y.o. male with history of COPD, HTN, who slipped and fell on to his left hip while dragging his trash can on 06/03/14. He had difficulty weight bearing on left leg and was admitted for workup. X rays done revealing subcapital left femoral neck fracture. He was evaluated by Dr. Alvan Dame and underwent closed reduction with pinning left femur on 06/04/14. Post op is PWB LLE and on Lovenox for  DVT prophylaxis. He has had difficulty voiding post op as well as ABLA with drop in H/H to 12.6/37.6. PT evaluation done today and patient limited by SOB with minimal activity. MD/ rehab team recommending CIR.  Past Medical History  Past Medical History  Diagnosis Date  . COPD (chronic obstructive pulmonary disease)   . Hyperlipidemia   . Hypothyroidism   . Pulmonary nodule, right   . Tobacco abuse     Quit 09/17/10    Family History  family history is not on file.  Prior Rehab/Hospitalizations: Pt had hospitalization for COPD exacerbation and received follow up outpt PT for COPD issues.  Current Medications  Current facility-administered medications: 0.9 % sodium chloride infusion, , Intravenous, Continuous, Allyne Gee, MD, Last Rate: 50 mL/hr at 06/03/14 2355; 0.9 % sodium chloride infusion, , Intravenous, Continuous, Mauri Pole, MD, Stopped at 06/06/14 0800; acetaminophen (TYLENOL) tablet 650 mg, 650 mg, Oral, Q6H PRN **OR** acetaminophen (TYLENOL) suppository 650 mg, 650 mg, Rectal, Q6H PRN, Mauri Pole, MD albuterol (PROVENTIL) (2.5 MG/3ML) 0.083% nebulizer solution 2.5 mg, 2.5 mg, Nebulization, Q4H PRN, Allyne Gee, MD, 2.5 mg at 06/05/14 0007; alum & mag hydroxide-simeth (MAALOX/MYLANTA) 200-200-20 MG/5ML suspension 30 mL, 30 mL, Oral, Q4H PRN, Mauri Pole, MD, 30 mL at 06/06/14 2117; budesonide-formoterol (SYMBICORT) 160-4.5 MCG/ACT inhaler 2 puff, 2 puff, Inhalation, BID, Delfina Redwood, MD, 2 puff at 06/07/14 0858 enoxaparin (LOVENOX) injection 40 mg, 40 mg, Subcutaneous, Q24H, Mauri Pole, MD, 40 mg at 21/19/41 7408; folic acid (FOLVITE) tablet 1 mg, 1 mg, Oral, Daily, Allyne Gee, MD, 1 mg at 06/07/14 1448; HYDROcodone-acetaminophen (NORCO/VICODIN) 5-325 MG per tablet 1-2 tablet, 1-2 tablet, Oral, Q6H PRN, Mauri Pole, MD, 2 tablet at 06/07/14 0709 levothyroxine (SYNTHROID, LEVOTHROID) tablet 50 mcg, 50 mcg, Oral,  QAC breakfast, Allyne Gee, MD, 50 mcg at  06/07/14 0709; menthol-cetylpyridinium (CEPACOL) lozenge 3 mg, 1 lozenge, Oral, PRN **OR** phenol (CHLORASEPTIC) mouth spray 1 spray, 1 spray, Mouth/Throat, PRN, Mauri Pole, MD methocarbamol (ROBAXIN) tablet 500 mg, 500 mg, Oral, Q6H PRN, 500 mg at 06/06/14 1328 **OR** methocarbamol (ROBAXIN) 500 mg in dextrose 5 % 50 mL IVPB, 500 mg, Intravenous, Q6H PRN, Mauri Pole, MD; metoCLOPramide (REGLAN) tablet 5-10 mg, 5-10 mg, Oral, Q8H PRN **OR** metoCLOPramide (REGLAN) injection 5-10 mg, 5-10 mg, Intravenous, Q8H PRN, Mauri Pole, MD; morphine 2 MG/ML injection 0.5 mg, 0.5 mg, Intravenous, Q2H PRN, Mauri Pole, MD multivitamin with minerals tablet 1 tablet, 1 tablet, Oral, Daily, Allyne Gee, MD, 1 tablet at 06/07/14 0948; ondansetron (ZOFRAN) tablet 4 mg, 4 mg, Oral, Q6H PRN **OR** ondansetron (ZOFRAN) injection 4 mg, 4 mg, Intravenous, Q6H PRN, Mauri Pole, MD, 4 mg at 06/05/14 8127; polyethylene glycol (MIRALAX / GLYCOLAX) packet 17 g, 17 g, Oral, Daily PRN, Mauri Pole, MD predniSONE (DELTASONE) tablet 2.5 mg, 2.5 mg, Oral, Q breakfast, Allyne Gee, MD, 2.5 mg at 06/07/14 0719; senna-docusate (Senokot-S) tablet 2 tablet, 2 tablet, Oral, QHS, Delfina Redwood, MD, 2 tablet at 06/06/14 2114; simvastatin (ZOCOR) tablet 5 mg, 5 mg, Oral, QHS, Allyne Gee, MD, 5 mg at 06/06/14 2114; thiamine (VITAMIN B-1) tablet 100 mg, 100 mg, Oral, Daily, Allyne Gee, MD, 100 mg at 06/07/14 0948 tiotropium [facility]) inhalation capsule 18 mcg, 18 mcg, Inhalation, Daily, Allyne Gee, MD, 18 mcg at 06/07/14 5170  Patients Current Diet: Diet regular  Precautions / Restrictions Precautions Precautions: Fall Precaution Comments: monitor O2 sats Restrictions Weight Bearing Restrictions: Yes LLE Weight Bearing: Partial weight bearing LLE Partial Weight Bearing Percentage or Pounds: 50   Prior Activity Level Community (5-7x/wk): Pt is active at  baseline and got out everyday. He meets friends for coffee three mornings a week at [employer]. 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  Home Assistive Devices / Equipment Home Equipment: None  Prior Functional Level Prior Function Level of Independence: Independent Comments: was on 2LO2 at night  Current Functional Level Cognition  Overall Cognitive Status: Within Functional Limits for tasks assessed Orientation Level: Oriented X4   Extremity Assessment (includes Sensation/Coordination)          ADLs  Overall ADL's : Needs assistance/impaired Eating/Feeding: Independent Grooming: Oral care, Wash/dry face, Min guard, Standing Upper Body Bathing: Set up, Sitting Lower Body Bathing: Minimal assistance, Sit to/from stand Upper Body Dressing : Set up, Sitting Lower Body Dressing: Moderate assistance, Sit to/from stand Toilet Transfer: Min guard, Ambulation, RW (BSC over toilet) Toilet Transfer Details (indicate cue type and reason): simulated from chair Toileting- Clothing Manipulation and Hygiene: Min guard, Sit to/from stand Functional mobility during ADLs: Minimal assistance, Rolling walker General ADL Comments: Pt ambulated to bathroom for toileting and stood at sink for ~4 minutes for grooming tasks without requiring a rest break to address endurance. Pt then returned to recliner chair.     Mobility  Overal bed mobility: Needs Assistance Bed Mobility: Sit to Supine Supine to sit: Min guard, HOB elevated Sit to supine: Min guard General bed mobility comments: pt up in chair and returned to chair    Transfers  Overall transfer level: Needs assistance Equipment used: Rolling walker (2 wheeled) Transfers: Sit to/from Stand Sit to Stand: Supervision General transfer comment: cues for hand placement and safety; no LOB  noted     Ambulation / Gait / Stairs / Wheelchair  Mobility  Ambulation/Gait Ambulation/Gait assistance: Museum/gallery curator (Feet): 80 Feet Assistive device: Rolling walker (2 wheeled) Gait Pattern/deviations: Step-to pattern, Decreased step length - right, Decreased stance time - left, Wide base of support, Trunk flexed Gait velocity: guarded Gait velocity interpretation: Below normal speed for age/gender General Gait Details: pt on 2L O2 with ambulation; at 98% while ambulating; pt demo SOB and incr fatigue with mobility; required multiple standing rest breaks; no LOB noted with mobility; min (A) to steady and manage RW with directional changes only; min cues for PWB status     Posture / Balance      Special needs/care consideration BiPAP/CPAP no CPM no  Continuous Drip IV no  Dialysis no  Life Vest no  Oxygen - currently on 3L O2 by nasal cannula Special Bed no  Trach Size no  Wound Vac (area) no  Skin - current L hip incision bandaged  Bowel mgmt: last BM on 06-03-14 Bladder mgmt: using urinal Diabetic mgmt no   Previous Home Environment Living Arrangements: Spouse/significant other Available Help at Discharge: Family, Available 24 hours/day Type of Home: House Home Layout: Two level, Able to live on main level with bedroom/bathroom Home Access: Stairs to enter [employer] of Steps: 1 Bathroom Shower/Tub: Tub/shower unit, Gaffer, Curtain (tub/shower on first floor) Bathroom Toilet: Standard Additional Comments: has a toilet riser, but may not be accessible  Discharge Living Setting Plans for Discharge Living Setting: Patient's home Type of Home at Discharge: House Discharge Home Layout: Two level, Able to live on main level with bedroom/bathroom Alternate Level Stairs-Number of Steps: flight Discharge Home Access: Stairs to enter Entrance Stairs-Number of Steps: 1 Discharge Bathroom Shower/Tub: Tub/shower unit, Walk-in shower  (tub/shower on 1st floor) Discharge Bathroom Toilet: Standard Does the patient have any problems obtaining your medications?: No  Social/Family/Support Systems Patient Roles: Spouse, Psychologist, 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 Ind) Caregiver Availability: 24/7 Discharge Plan Discussed with Primary Caregiver: Yes Is Caregiver In Agreement with Plan?: Yes Does Caregiver/Family have Issues with Lodging/Transportation while Pt is in Rehab?: No    Goals/Additional Needs Patient/Family Goal for Rehab: Mod Ind. with PT/OT, NA for SLP Expected length of stay: 7-9 days Cultural Considerations: pt is involved in his church but no specific cultural considerations Dietary Needs: regular diet Equipment Needs: to be determined Pt/Family Agrees to Admission and willing to participate: Yes (spoke with pt's wife and dtr) Program Orientation Provided & Reviewed with Pt/Caregiver Including Roles & Responsibilities: Yes   Decrease burden of Care through IP rehab admission: NA  Possible need for SNF placement upon discharge: not anticipated  Patient Condition: This patient's condition remains as documented in the consult dated 06-06-14, in which the Rehabilitation Physician determined and documented that the patient's condition is appropriate for intensive rehabilitative care in an inpatient rehabilitation facility. Will admit to inpatient rehab today.  Preadmission Screen Completed By: Nanetta Batty, PT, 06/07/2014 10:50 AM ______________________________________________________________________  Discussed status with Dr. Naaman Plummer on 06-07-14 at 1040 and received telephone approval for admission today.  Admission Coordinator: Nanetta Batty, PT, time 1040/Date 06-07-14          Cosigned by: Meredith Staggers, MD at 06/07/2014 10:56 AM  Revision History  Date/Time User Provider Type Action   06/07/2014 10:56 AM Meredith Staggers, MD Physician Cosign   06/07/2014 10:50 AM [facility] Admission Coordinator Sign

## 2014-06-07 NOTE — H&P (Signed)
Physical Medicine and Rehabilitation Admission H&P    Chief Complaint  Patient presents with  . Left hip fracture.     HPI: [name] is a 80 y.o. male with history of COPD, HTN, who slipped and fell on to his left hip while dragging his trash can on 06/03/14. He had difficulty weight bearing on left leg and was admitted for workup. X rays done revealing subcapital left femoral neck fracture. He was evaluated by Dr. Alvan Dame and underwent closed reduction with pinning left femur on 06/04/14. Post op is PWB LLE and on Lovenox for DVT prophylaxis. He has had difficulty voiding post op as well as ABLA with drop in H/H to 12.6/37.6. Therapy ongoing and patient limited by acute on chronic SOB with minimal activity. MD/ rehab team recommending CIR and patient admitted today.    ROS    Past Medical History  Diagnosis Date  . COPD (chronic obstructive pulmonary disease)   . Hyperlipidemia   . Hypothyroidism   . Pulmonary nodule, right   . Tobacco abuse     Quit 09/17/10    Past Surgical History  Procedure Laterality Date  . Tracheostomy      after aspiration event as a 80 y/o  . Electronavigational bronchoscopy  07/2010    History reviewed. No pertinent family history.    Social History:  Married. Retired [employer] for [employer]. He reports that he quit smoking about 3 years ago. His smoking use included Cigarettes. He has a 55 pack-year smoking history. He has never used smokeless tobacco. He reports that he does not drink alcohol. His drug history is not on file.    Allergies  Allergen Reactions  . Sulfonamide Derivatives     unknown    Medications Prior to Admission  Medication Sig Dispense Refill  . albuterol (VENTOLIN HFA) 108 (90 BASE) MCG/ACT inhaler Inhale 2 puffs into the lungs every 4 (four) hours as needed for shortness of breath.     . budesonide-formoterol (SYMBICORT) 160-4.5 MCG/ACT inhaler Inhale 2 puffs into the lungs 2 (two) times daily. 1 Inhaler 5    . levothyroxine (SYNTHROID, LEVOTHROID) 50 MCG tablet Take 50 mcg by mouth daily.    . predniSONE (DELTASONE) 5 MG tablet Take 0.5 tablets (2.5 mg total) by mouth daily with breakfast. 30 tablet 3  . simvastatin (ZOCOR) 5 MG tablet Take 5 mg by mouth at bedtime.      Marland Kitchen tiotropium (SPIRIVA HANDIHALER) 18 MCG inhalation capsule Place 1 capsule (18 mcg total) into inhaler and inhale daily. 30 capsule 5    Home: Home Living Family/patient expects to be discharged to:: Private residence Living Arrangements: Spouse/significant other Available Help at Discharge: Family, Available 24 hours/day Type of Home: House Home Access: Stairs to enter [employer] of Steps: 1 Home Layout: Two level, Able to live on main level with bedroom/bathroom Home Equipment: None Additional Comments: has a toilet riser, but may not be accessible   Functional History: Prior Function Level of Independence: Independent Comments: was on 2LO2 at night  Functional Status:  Mobility: Bed Mobility Overal bed mobility: Needs Assistance Bed Mobility: Sit to Supine Supine to sit: Min guard, HOB elevated Sit to supine: Min guard General bed mobility comments: pt up in chair and returned to chair Transfers Overall transfer level: Needs assistance Equipment used: Rolling walker (2 wheeled) Transfers: Sit to/from Stand Sit to Stand: Supervision General transfer comment: cues for hand placement and safety; no LOB noted  Ambulation/Gait Ambulation/Gait assistance: [employer]  assist Ambulation Distance (Feet): 80 Feet Assistive device: Rolling walker (2 wheeled) Gait Pattern/deviations: Step-to pattern, Decreased step length - right, Decreased stance time - left, Wide base of support, Trunk flexed Gait velocity: guarded Gait velocity interpretation: Below normal speed for age/gender General Gait Details: pt on 2L O2 with ambulation; at 98% while ambulating; pt demo SOB and incr fatigue with mobility; required  multiple standing rest breaks; no LOB noted with mobility; min (A) to steady and manage RW with directional changes only; min cues for PWB status     ADL: ADL Overall ADL's : Needs assistance/impaired Eating/Feeding: Independent Grooming: Oral care, Wash/dry face, Min guard, Standing Upper Body Bathing: Set up, Sitting Lower Body Bathing: Minimal assistance, Sit to/from stand Upper Body Dressing : Set up, Sitting Lower Body Dressing: Moderate assistance, Sit to/from stand Toilet Transfer: Min guard, Ambulation, RW (BSC over toilet) Toilet Transfer Details (indicate cue type and reason): simulated from chair Toileting- Clothing Manipulation and Hygiene: Min guard, Sit to/from stand Functional mobility during ADLs: Minimal assistance, Rolling walker General ADL Comments: Pt ambulated to bathroom for toileting and stood at sink for ~4 minutes for grooming tasks without requiring a rest break to address endurance. Pt then returned to recliner chair.   Cognition: Cognition Overall Cognitive Status: Within Functional Limits for tasks assessed Orientation Level: Oriented X4 Cognition Arousal/Alertness: Awake/alert Behavior During Therapy: WFL for tasks assessed/performed Overall Cognitive Status: Within Functional Limits for tasks assessed   Blood pressure 150/62, pulse 80, temperature 98.4 F (36.9 C), temperature source Oral, resp. rate 24, height 5\' 10"  (1.778 m), weight 87.091 kg (192 lb), SpO2 98 %. Physical Exam Nursing note and vitals reviewed. Constitutional: He is oriented to person, place, and time. He appears well-developed and well-nourished.  HENT: oral mucosa pink/moist Head: Normocephalic and atraumatic.  Eyes: Conjunctivae are normal. Pupils are equal, round, and reactive to light.  Neck: Normal range of motion. Neck supple.  Cardiovascular: Normal rate and regular rhythm.no murmur  Respiratory: Effort normal and breath sounds normal. No respiratory distress. He has  no wheezes.  Wearing oxygen  GI: Soft. Bowel sounds are normal. He exhibits no distension. There is no tenderness.  Musculoskeletal:  Moves BUE and RLE without difficulty. LLE with pain during passive ROM. Some swelling around incisions  Neurological: He is alert and oriented to person, place, and time.  Speech clear. Follows commands without difficulty. UE's 4+/5 prox to distal. RLE 3+HF, 4/5 knee and ankle. Left lower (pain) 1/5 hf, 2/5 kne and 4/5 adf/apf Skin: Skin is warm and dry.  Psychiatric: He has a normal mood and affect. His behavior is normal. Judgment and thought content normal.     No results found for this or any previous visit (from the past 48 hour(s)). No results found.     Medical Problem List and Plan: 1. Functional deficits secondary to Left femoral neck fracture s/p closed reduction/pinnnin 2.  DVT Prophylaxis/Anticoagulation: Pharmaceutical: Lovenox 3. Pain Management: hydrocodone and robaxin prn 4. Mood: pt in good spirits. Team to provide egosupport 5. Neuropsych: This patient is capabable of making decisions on his own behalf. 6. Skin/Wound Care: local care to incision. Decubitus precautions. Encourage appropriate intake 7. Fluids/Electrolytes/Nutrition: encourage po, follow up labwork on admit 8. COPD: spiriva, oxygen as needed 9. ABLA: counts fairly robust- vitamin/dietary sources 10. HTN: normotensive.  11. Urinary retention: pvr's low. I/o cath prn. Seems to be voiding fairly well over the last 48 hours      Post Admission Physician Evaluation: 1.  Functional deficits secondary  to Left femoral neck fracture.  2. Patient is admitted to receive collaborative, interdisciplinary care between the physiatrist, rehab nursing staff, and therapy team. 3. Patient's level of medical complexity and substantial therapy needs in context of that medical necessity cannot be provided at a lesser intensity of care such as a SNF. 4. Patient has experienced  substantial functional loss from his/her baseline which was documented above under the "Functional History" and "Functional Status" headings.  Judging by the patient's diagnosis, physical exam, and functional history, the patient has potential for functional progress which will result in measurable gains while on inpatient rehab.  These gains will be of substantial and practical use upon discharge  in facilitating mobility and self-care at the household level. 5. Physiatrist will provide 24 hour management of medical needs as well as oversight of the therapy plan/treatment and provide guidance as appropriate regarding the interaction of the two. 6. 24 hour rehab nursing will assist with bladder management, bowel management, safety, skin/wound care, disease management, medication administration, pain management and patient education  and help integrate therapy concepts, techniques,education, etc. 7. PT will assess and treat for/with: Lower extremity strength, range of motion, stamina, balance, functional mobility, safety, adaptive techniques and equipment, pain mgt, ortho precautions, pt/family ed.   Goals are: mod I. 8. OT will assess and treat for/with: ADL's, functional mobility, safety, upper extremity strength, adaptive techniques and equipment, pain mgt, ortho precautions, pt/family ed.   Goals are: mod I. Therapy may proceed with showering this patient if wound is covered 9. SLP will assess and treat for/with: n/a.  Goals are: n/a. 10. Case Management and Social Worker will assess and treat for psychological issues and discharge planning. 11. Team conference will be held weekly to assess progress toward goals and to determine barriers to discharge. 12. Patient will receive at least 3 hours of therapy per day at least 5 days per week. 13. ELOS: 7 days       14. Prognosis:  excellent     Meredith Staggers, MD, [facility] 06/07/2014   06/07/2014

## 2014-06-08 ENCOUNTER — Inpatient Hospital Stay (HOSPITAL_COMMUNITY): Payer: Medicare Other | Admitting: *Deleted

## 2014-06-08 ENCOUNTER — Inpatient Hospital Stay (HOSPITAL_COMMUNITY): Payer: Medicare Other | Admitting: Occupational Therapy

## 2014-06-08 DIAGNOSIS — J438 Other emphysema: Secondary | ICD-10-CM

## 2014-06-08 DIAGNOSIS — S72002A Fracture of unspecified part of neck of left femur, initial encounter for closed fracture: Secondary | ICD-10-CM

## 2014-06-08 LAB — URINE CULTURE: Colony Count: 1000

## 2014-06-08 MED ORDER — HYDROCODONE-ACETAMINOPHEN 5-325 MG PO TABS
1.0000 | ORAL_TABLET | Freq: Four times a day (QID) | ORAL | Status: DC | PRN
  Filled 2014-06-08: qty 1

## 2014-06-08 MED ORDER — ONDANSETRON HCL 4 MG PO TABS: 4.0000 mg | ORAL_TABLET | Freq: Three times a day (TID) | ORAL | Status: DC | PRN

## 2014-06-08 MED ORDER — ONDANSETRON HCL 4 MG/2ML IJ SOLN: 4.0000 mg | Freq: Three times a day (TID) | INTRAMUSCULAR | Status: DC | PRN

## 2014-06-08 MED ORDER — AMLODIPINE BESYLATE 2.5 MG PO TABS
2.5000 mg | ORAL_TABLET | Freq: Every day | ORAL | Status: DC
  Administered 2014-06-08 – 2014-06-12 (×5): 2.5 mg via ORAL
  Filled 2014-06-08 (×6): qty 1

## 2014-06-08 NOTE — Evaluation (Signed)
Physical Therapy Assessment and Plan  Patient Details  Name: [name] MRN: [id number] Date of Birth: [birth year]  PT Diagnosis: Abnormality of gait,SOB,muscle weakness Rehab Potential: Excellent ELOS: 5-7 days   Today's Date: 06/08/2014 PT Individual Time: 1250-1350 PT Individual Time Calculation (min): 60 min    Problem List:  Patient Active Problem List   Diagnosis Date Noted  . Fracture of femoral neck, left 06/07/2014  . Hypothyroidism 06/04/2014  . Closed left hip fracture 06/03/2014  . COPD (chronic obstructive pulmonary disease) 06/03/2014  . Hyperlipidemia 06/03/2014  . Hip fracture 06/03/2014  . Chronic respiratory failure with hypoxia 10/27/2013  . 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 COPD (chronic obstructive pulmonary disease)   . Hyperlipidemia   . Hypothyroidism   . Pulmonary nodule, right   . Tobacco abuse     Quit 09/17/10   Past Surgical History:  Past Surgical History  Procedure Laterality Date  . Tracheostomy      after aspiration event as a 80 y/o  . Electronavigational bronchoscopy  07/2010    Assessment & Plan Clinical Impression:   [name] is a 80 y.o. male with history of COPD, HTN, who slipped and fell on to his left hip while dragging his trash can on 06/03/14. He had difficulty weight bearing on left leg and was admitted for workup. X rays done revealing subcapital left femoral neck fracture. He was evaluated by Dr. Alvan Dame and underwent closed reduction with pinning left femur on 06/04/14. Post op is PWB LLE and on Lovenox for DVT prophylaxis.  Patient currently requires min with mobility secondary to muscle weakness.  Prior to hospitalization, patient was independent  with mobility and lived with [facility] a House home.  Home access is 1Stairs to enter.  Patient will benefit from skilled PT intervention to maximize safe functional mobility for planned discharge home with  24 hour supervision.  Anticipate patient will benefit from follow up [place] at discharge.  PT - End of Session Activity Tolerance: Tolerates < 10 min activity with changes in vital signs Endurance Deficit: Yes PT Assessment Rehab Potential (ACUTE/IP ONLY): Excellent Barriers to Discharge: Other (comment) PT Patient demonstrates impairments in the following area(s): Endurance;Motor PT Transfers Functional Problem(s): Bed to Chair PT Locomotion Functional Problem(s): Ambulation;Wheelchair Mobility;Stairs PT Plan PT Intensity: Minimum of 1-2 x/day ,45 to 90 minutes PT Frequency: 5 out of 7 days PT Duration Estimated Length of Stay: 5-7 days PT Treatment/Interventions: Ambulation/gait training;Neuromuscular re-education;Patient/family education;Therapeutic Activities;Functional mobility training;Discharge planning;Therapeutic Exercise PT Transfers Anticipated Outcome(s): Modified independance for transfers PT Locomotion Anticipated Outcome(s): Modified indeopendance with short distance ambulation PT Recommendation Follow Up Recommendations: Home health PT Patient destination: Home Equipment Recommended: To be determined  Skilled Therapeutic Intervention Session I 1300-1400 (60 min) Patient session was focused on evaluation and assessment. Patient has participated in transfer training with SBA and gait with RW on short distance with SBA and often rest breaks due to SOB. Training in stairs training 1 x 3 steps with B Rails with min A.  W/c has been issued for longer distances and patient has been educated on use and management of parts-propulsion training on a distance of 150 feet. Patient's wife was present during session and stated she wants him to come home by christmas. Education on safety and goals that have been established, -pt and wife in agreement.  Session II 1510-1540 (30 min) Gait training with RW with rest breaks, close  O2 monitoring ,patient on 3 l/min O2 via [place]. Patient ambulated  from therapy gym to day room and back.  Training sit to stand transfers with use of UE and proper weight distribution due to PWB on L side. Patient returned to room , all needs within reach.         PT Evaluation Precautions/Restrictions Precautions Precautions: Fall Precaution Comments: monitor O2 Restrictions Weight Bearing Restrictions: Yes LLE Weight Bearing: Partial weight bearing LLE Partial Weight Bearing Percentage or Pounds: 50 General Chart Reviewed: Yes Family/Caregiver Present: Yes Vital SignsTherapy Vitals Pulse Rate: 70 BP: (!) 152/62 mmHg Patient Position (if appropriate): Sitting Pain Pain Assessment Pain Assessment: No/denies pain Pain Score: 0-No pain Home Living/Prior Functioning Home Living Available Help at Discharge: Available 24 hours/day Type of Home: House Home Access: Stairs to enter [employer] of Steps: 1 Home Layout: Two level Alternate Level Stairs-Number of Steps: Patient stays downstairs , bedroom and bathroom accesiable  Lives With: Family Prior Function Level of Independence: Independent with basic ADLs;Independent with gait;Independent with transfers;Independent with homemaking with ambulation  Able to Take Stairs?: Reciprically Driving: Yes Vocation: Retired  Associate Professor Overall Cognitive Status: Within Functional Limits for tasks assessed Arousal/Alertness: Awake/alert Orientation Level: Oriented X4 Attention: Focused Focused Attention: Appears intact Memory: Appears intact Awareness: Appears intact Problem Solving: Appears intact Sensation Sensation Light Touch: Appears Intact Stereognosis: Appears Intact Hot/Cold: Appears Intact Proprioception: Appears Intact Coordination Gross Motor Movements are Fluid and Coordinated: No Fine Motor Movements are Fluid and Coordinated: No Motor  Motor Motor: Within Functional Limits  Mobility Bed Mobility Bed Mobility: Supine to Sit;Sit to Supine Supine to Sit: 4:  Min guard Sit to Supine: 4: Min guard Transfers Transfers: Yes Sit to Stand: 4: Min guard Locomotion  Ambulation Ambulation: Yes Ambulation/Gait Assistance: 4: Min guard Ambulation Distance (Feet): 100 Feet Assistive device: Rolling walker Gait Gait: Yes Gait Pattern: Decreased step length - right Gait velocity: decreased velocity Stairs / Additional Locomotion Stairs: Yes Stairs Assistance: 4: Min guard Stair Management Technique: Two rails Number of Stairs: 3 Product manager Mobility: Yes Wheelchair Assistance: 4: Min Tour manager: Both upper extremities Distance: 100  Trunk/Postural Assessment  Cervical Assessment Cervical Assessment: Within Water engineer Thoracic Assessment: Within Functional Limits Lumbar Assessment Lumbar Assessment: Within Functional Limits Postural Control Postural Control: Within Functional Limits  Balance Balance Balance Assessed: Yes Extremity Assessment  RUE Assessment RUE Assessment: Within Functional Limits LUE Assessment LUE Assessment: Within Functional Limits RLE Assessment RLE Assessment: Within Functional Limits LLE Assessment LLE Assessment: Exceptions to [facility] LLE Strength Left Hip Flexion: 3+/5 Left Hip Extension: 3+/5 Left Hip ABduction: 3+/5 Left Hip ADduction: 3+/5 Left Knee Flexion: 3+/5 Left Knee Extension: 3+/5 Left Ankle Dorsiflexion: 4/5 Left Ankle Plantar Flexion: 4/5 Left Ankle Inversion: 4/5 Left Ankle Eversion: 4/5  FIM:  FIM - Bed/Chair Transfer Bed/Chair Transfer Assistive Devices: Arm rests Bed/Chair Transfer: 4: Bed > Chair or W/C: Min A (steadying Pt. > 75%) FIM - Locomotion: Wheelchair Distance: 100 Locomotion: Wheelchair: 1: Travels less than 50 ft with minimal assistance (Pt.>75%) FIM - Locomotion: Ambulation Locomotion: Ambulation Assistive Devices: Administrator Ambulation/Gait Assistance: 4: Min guard Locomotion: Ambulation: 1:  Travels less than 50 ft with minimal assistance (Pt.>75%) FIM - Locomotion: Stairs Locomotion: Scientist, physiological: Hand rail - 2 Locomotion: Stairs: 1: Up and Down < 4 stairs with minimal assistance (Pt.>75%)   Refer to Care Plan for Long Term Goals  Recommendations for other services: None  Discharge Criteria: Patient will  be discharged from PT if patient refuses treatment 3 consecutive times without medical reason, if treatment goals not met, if there is a change in medical status, if patient makes no progress towards goals or if patient is discharged from hospital.  The above assessment, treatment plan, treatment alternatives and goals were discussed and mutually agreed upon: by patient  Guadlupe Spanish 06/08/2014, 1:57 PM

## 2014-06-08 NOTE — Evaluation (Signed)
Occupational Therapy Assessment and Plan  Patient Details  Name: [name] MRN: [id number] Date of Birth: [birth year]  OT Diagnosis: muscle weakness (generalized) Rehab Potential: Rehab Potential (ACUTE ONLY): Excellent ELOS: 5-7 days   Today's Date: 06/08/2014 OT Individual Time: 9826-4158 and 1130- 1220 and 1430-1510 OT Individual Time Calculation (min): 10 min  and 50 min and 40 min  Problem List:  Patient Active Problem List   Diagnosis Date Noted  . Fracture of femoral neck, left 06/07/2014  . Hypothyroidism 06/04/2014  . Closed left hip fracture 06/03/2014  . COPD (chronic obstructive pulmonary disease) 06/03/2014  . Hyperlipidemia 06/03/2014  . Hip fracture 06/03/2014  . Chronic respiratory failure with hypoxia 10/27/2013  . 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 COPD (chronic obstructive pulmonary disease)   . Hyperlipidemia   . Hypothyroidism   . Pulmonary nodule, right   . Tobacco abuse     Quit 09/17/10   Past Surgical History:  Past Surgical History  Procedure Laterality Date  . Tracheostomy      after aspiration event as a 80 y/o  . Electronavigational bronchoscopy  07/2010    Assessment & Plan Clinical Impression:[name] is a 80 y.o. male with history of COPD, HTN, who slipped and fell on to his left hip while dragging his trash can on 06/03/14. He had difficulty weight bearing on left leg and was admitted for workup. X rays done revealing subcapital left femoral neck fracture. He was evaluated by Dr. Alvan Dame and underwent closed reduction with pinning left femur on 06/04/14. Post op is PWB LLE and on Lovenox for DVT prophylaxis. He has had difficulty voiding post op as well as ABLA with drop in H/H to 12.6/37.6.  Therapy ongoing and patient limited by acute on chronic SOB with minimal activity. MD/ rehab team recommending CIR and patient admitted today.     Patient transferred to CIR on  06/07/2014 .    Patient currently requires min with basic self-care skills secondary to muscle weakness, decreased oxygen support and decreased standing balance.  Prior to hospitalization, patient was fully independent, active in the community, exercised, and drove.  Patient will benefit from skilled intervention to increase independence with basic self-care skills and increase level of independence with iADL prior to discharge home with care partner.  Anticipate patient will require intermittent supervision and no further OT follow recommended.  OT - End of Session Activity Tolerance: Tolerates 10 - 20 min activity with multiple rests Endurance Deficit: Yes OT Assessment Rehab Potential (ACUTE ONLY): Excellent OT Patient demonstrates impairments in the following area(s): Balance;Endurance OT Basic ADL's Functional Problem(s): Bathing;Dressing;Toileting OT Transfers Functional Problem(s): Toilet;Tub/Shower OT Additional Impairment(s): None OT Plan OT Intensity: Minimum of 1-2 x/day, 45 to 90 minutes OT Frequency: 5 out of 7 days OT Duration/Estimated Length of Stay: 5-7 days OT Treatment/Interventions: Balance/vestibular training;Discharge planning;DME/adaptive equipment instruction;Functional mobility training;Patient/family education;Self Care/advanced ADL retraining;Therapeutic Activities OT Self Feeding Anticipated Outcome(s): I OT Basic Self-Care Anticipated Outcome(s): mod I OT Toileting Anticipated Outcome(s): mod I OT Bathroom Transfers Anticipated Outcome(s): mod I to toilet, supervision to tub bench OT Recommendation Patient destination: Home Follow Up Recommendations: None Equipment Recommended: 3 in 1 bedside comode;Tub/shower bench;To be determined (Pt's wife may have access to this equipment.)   Skilled Therapeutic Intervention  Pt on 3L of 02 in all sessions. Visit 1: No c/o pain. Nursing reports pt's blood pressure is 168/136 and she is waiting  on a blood pressure  medication from the pharmacy. He is not to be out of the bed at this time.   Pt was able to participate in an interview to review his PLOF, home environment, his goals, and part of the evaluation.  Visit 2: No treatment, nursing reports pt's blood pressure is still elevated.  Visit 3: Pt is now able to tolerate therapy. BP WNL. No c/o pain.  Pt's wife is present.  She is able to assist him at home full time. Pt anxious to get out of bed and participate in therapy. He was able to sit to EOB without A. He used a RW to ambulate to arm chair at sink with S maintaining his PWB status. Pt able to bathe all areas except for feet due to stiffness in hip. He then ambulated to toilet and able to sit on regular toilet without difficulty. He donned pants over feet. And pulled over hips with S. Needs A with TEDs and socks.  Pt breathes very heavily through his mouth and stated he has breathed this way for years from his COPD. Worked on slow nasal breathing in and out through his mouth. Pt transferred to recliner to eat his lunch.  Visit 4: no c/o pain. Pt seen this session to address bathroom transfers. Pt donned shoes, but unable to reach to tie them. He ambulated to toilet and completed toileting with supervision, then washed hands standing at sink.  He ambulated 1/2 way to ADL apt then took a 2 min standing rest break. He continued to apt to sit on edge of tub bench. Did not complete transfer today as his L hip was feeling stiff. He then sat in chair in apt to rest, stating, "my hip is not bothering me, it is my breathing". Pt then ambulated to gym and his PT arrived for his next session.  OT Evaluation Precautions/Restrictions  Precautions Precautions: Fall Precaution Comments: monitor O2 Restrictions Weight Bearing Restrictions: Yes LLE Weight Bearing: Partial weight bearing LLE Partial Weight Bearing Percentage or Pounds: 50   Vital Signs Therapy Vitals Temp: 97.7 F (36.5 C) Temp Source:  Oral Pulse Rate: (!) 56 Resp: 20 BP: (!) 143/58 mmHg Patient Position (if appropriate): Sitting Oxygen Therapy SpO2: 100 % O2 Device: Not Delivered Pain Pain Assessment Pain Assessment: No/denies pain Home Living/Prior Functioning Home Living Available Help at Discharge: Available 24 hours/day Type of Home: House Home Access: Stairs to enter Technical brewer of Steps: 1 Home Layout: Two level Alternate Level Stairs-Number of Steps: Patient stays downstairs , bedroom and bathroom with tub on main floor  Lives With: Family Prior Function Level of Independence: Independent with basic ADLs, Independent with gait, Independent with transfers, Independent with homemaking with ambulation  Able to Take Stairs?: Reciprically Driving: Yes Vocation: Retired Leisure: Hobbies-yes (Comment) (very active with his church) Comments: was on 2LO2 at night ADL ADL ADL Comments: Refer to FIM Vision/Perception  Vision- History Baseline Vision/History: Wears glasses (cataract surgery scheduled for next week, pt had to cancel) Wears Glasses: At all times Patient Visual Report: No change from baseline Vision- Assessment Vision Assessment?: No apparent visual deficits  Cognition Overall Cognitive Status: Within Functional Limits for tasks assessed Arousal/Alertness: Awake/alert Orientation Level: Oriented X4 Attention: Focused Focused Attention: Appears intact Memory: Appears intact Awareness: Appears intact Problem Solving: Appears intact Sensation Sensation Light Touch: Appears Intact Stereognosis: Appears Intact Hot/Cold: Appears Intact Proprioception: Appears Intact Coordination Gross Motor Movements are Fluid and Coordinated: No Fine Motor Movements are Fluid and  Coordinated: No Coordination and Movement Description: UE Coordination WFL Motor  Motor Motor: Within Functional Limits Mobility  Bed Mobility Bed Mobility: Supine to Sit;Sit to Supine Supine to Sit: 4: Min  guard Sit to Supine: 4: Min guard Transfers Sit to Stand: 4: Min guard  Trunk/Postural Assessment  Cervical Assessment Cervical Assessment: Within Functional Limits Thoracic Assessment Thoracic Assessment: Within Functional Limits Lumbar Assessment Lumbar Assessment: Within Functional Limits Postural Control Postural Control: Within Functional Limits  Balance Balance Balance Assessed: Yes Static Standing Balance Static Standing - Level of Assistance: 5: Stand by assistance Dynamic Standing Balance Dynamic Standing - Level of Assistance: 5: Stand by assistance Extremity/Trunk Assessment RUE Assessment RUE Assessment: Within Functional Limits LUE Assessment LUE Assessment: Within Functional Limits  FIM:  FIM - Eating Eating Activity: 7: Complete independence:no helper FIM - Grooming Grooming Steps: Wash, rinse, dry face;Wash, rinse, dry hands;Oral care, brush teeth, clean dentures Grooming: 7:Complete independence: no helper (standing at sink) FIM - Bathing Bathing Steps Patient Completed: Chest;Right Arm;Left Arm;Abdomen;Left upper leg;Right upper leg;Buttocks;Front perineal area Bathing: 4: Min-Patient completes 8-9 [id number] parts or 75+ percent FIM - Upper Body Dressing/Undressing Upper body dressing/undressing steps patient completed: Thread/unthread right sleeve of pullover shirt/dresss;Thread/unthread left sleeve of pullover shirt/dress;Put head through opening of pull over shirt/dress;Pull shirt over trunk Upper body dressing/undressing: 5: Set-up assist to: Obtain clothing/put away FIM - Lower Body Dressing/Undressing Lower body dressing/undressing steps patient completed: Thread/unthread right pants leg;Thread/unthread left pants leg;Pull pants up/down;Don/Doff left shoe;Don/Doff right shoe (no underwear by pt choice) Lower body dressing/undressing: 4: Min-Patient completed 75 plus % of tasks FIM - Toileting Toileting steps completed by patient: Performs perineal  hygiene;Adjust clothing prior to toileting;Adjust clothing after toileting Toileting: 5: Supervision: Safety issues/verbal cues FIM - Bed/Chair Transfer Bed/Chair Transfer Assistive Devices: WAdult nurseTransfer: 5: Supine > Sit: Supervision (verbal cues/safety issues);5: Bed > Chair or W/C: Supervision (verbal cues/safety issues) FIM - TRadio producerDevices: WMining engineerTransfers: 5-To toilet/BSC: Supervision (verbal cues/safety issues);5-From toilet/BSC: Supervision (verbal cues/safety issues) FIM - Tub/Shower Transfers Tub/shower Transfers: 0-Activity did not occur or was simulated   Refer to Care Plan for Long Term Goals  Recommendations for other services: None  Discharge Criteria: Patient will be discharged from OT if patient refuses treatment 3 consecutive times without medical reason, if treatment goals not met, if there is a change in medical status, if patient makes no progress towards goals or if patient is discharged from hospital.  The above assessment, treatment plan, treatment alternatives and goals were discussed and mutually agreed upon: by patient and by family  Joniqua Sidle 06/08/2014, 4:30 PM

## 2014-06-08 NOTE — Progress Notes (Signed)
Subjective: Patient has felt somewhat dizzy and nauseated since taking his pain medication this morning. He states that when he closes his eyes and then opens them he has some nausea. He denies any emesis. Nurse called me this morning with a blood pressure documenting a diastolic blood pressure over 130. I asked her to recheck that manually and that number was not confirmed. Patient is otherwise feeling well and complains of only minor discomfort of the hip.   Objective: BP 150/98 mmHg  Pulse 63  Temp(Src) 98.4 F (36.9 C) (Oral)  Resp 16  Ht 5\' 10"  (1.778 m)  Wt 196 lb 3.4 oz (89 kg)  BMI 28.15 kg/m2  SpO2 99% Elderly male in no acute distress. Chest clear to auscultation. Cardiac exam S1 and S2 are regular. Abdominal exam active bowel sounds, soft. Extremities no edema. Neurologic exam he is alert and oriented.  Assessment and plan:  Medical Problem List and Plan: 1. Functional deficits secondary to Left femoral neck fracture s/p closed reduction/pinnning 2. DVT Prophylaxis/Anticoagulation: Pharmaceutical: Lovenox 3. Pain Management: hydrocodone and robaxin prn I'm going to decrease the dose and frequency of hydrocodone. I think the pain medications are contributing to his nausea. 4. Mood: pt in good spirits. Team to provide egosupport 5. Neuropsych: This patient is capabable of making decisions on his own behalf. 6. Skin/Wound Care: local care to incision. Decubitus precautions. Encourage appropriate intake 7. Fluids/Electrolytes/Nutrition: encourage po, follow up labwork on admit 8. COPD: spiriva, oxygen as needed 9. ABLA: counts fairly robust- vitamin/dietary sources 10. HTN: Blood pressure does seem to be elevated. We will add amlodipine. 11. Urinary retention: pvr's low. I/o cath prn. Seems to be voiding fairly well over the last 48 hours

## 2014-06-08 NOTE — Significant Event (Signed)
Dr Leanne Chang called notified of b/p of 168/136 ,hr 63,sat 99% on 3 lit n/c. Pt c/o feeling dizzy , light headed. Manual b/p obtained as per MD request 150/98. Orders received . Will continue to monitor Pt.

## 2014-06-08 NOTE — Significant Event (Signed)
Norvasc 2.5 mg given as per order . One hour after Norvasc rechecked B/P and now 152/70 , HR 70. Pt denies being dizzy or lightheaded states " feels much better now". Will continue to monitor patient.

## 2014-06-09 ENCOUNTER — Inpatient Hospital Stay (HOSPITAL_COMMUNITY): Payer: Medicare Other | Admitting: Physical Therapy

## 2014-06-09 DIAGNOSIS — S72002D Fracture of unspecified part of neck of left femur, subsequent encounter for closed fracture with routine healing: Principal | ICD-10-CM

## 2014-06-09 DIAGNOSIS — I10 Essential (primary) hypertension: Secondary | ICD-10-CM

## 2014-06-09 NOTE — Progress Notes (Signed)
Subjective: Patient feels well this morning. Nausea has resolved. Dizziness has resolved. Yesterday I did decrease his pain medications. I am not sure if that's related to his improvement.   Objective: BP 176/79 mmHg  Pulse 82  Temp(Src) 98.3 F (36.8 C) (Oral)  Resp 19  Ht 5\' 10"  (1.778 m)  Wt 196 lb 3.4 oz (89 kg)  BMI 28.15 kg/m2  SpO2 96% Elderly male in no acute distress. Chest clear to auscultation. Cardiac exam S1 and S2 are regular. Abdominal exam active bowel sounds, soft. Extremities no edema. Neurologic exam he is alert and oriented.  Assessment and plan:  Medical Problem List and Plan: 1. Functional deficits secondary to Left femoral neck fracture s/p closed reduction/pinnning 2. DVT Prophylaxis/Anticoagulation: Pharmaceutical: Lovenox 3. Pain Management: He only has mild discomfort when he tries to move his affected leg. I'm going to decrease the dose and frequency of hydrocodone. I think the pain medications are contributing to his nausea. 4. Mood: pt in good spirits.  5. Neuropsych: This patient is capabable of making decisions on his own behalf. 6. Skin/Wound Care: local care to incision. Decubitus precautions. Encourage appropriate intake 7. Fluids/Electrolytes/Nutrition: encourage po, follow up labwork on admit 8. COPD: spiriva, oxygen as needed 9. ABLA: counts fairly robust- vitamin/dietary sources 10. HTN: Blood pressure ranging 143-176/58-79 Amlodipine started on 06/08/2014 11. Urinary retention: resolving

## 2014-06-09 NOTE — Progress Notes (Signed)
Physical Therapy Session Note  Patient Details  Name: [name] MRN: [id number] Date of Birth: [birth year]  Today's Date: 06/09/2014 PT Individual Time: 0800-0900 PT Individual Time Calculation (min): 60 min   Short Term Goals: Week 1:  PT Short Term Goal 1 (Week 1): ST=LTG  Skilled Therapeutic Interventions/Progress Updates:  Pt was seen bedside in the am. Pt transferred supine to edge of bed with bed rails and head of bed elevated, requiring S. Pt transferred sit to stand with rolling walker and S. Pt ambulated in and out of bathroom with rolling walker and S to min guard with verbal cues. Pt performed toilet transfers with grad bars and S. Pt propelled w/c to gym about 150 feet with B LEs and S. Pt ambulated 90 feet x 3 with rolling walker and S to min guard and occasional verbal cues. Pt performed LE exercises in w/c for strengthening and flexibility. O2 sat fluctuated between 91 to 98% on 3 liters. Pt propelled w/c with B LEs back room at end of treatment and left sitting up in w/c with call bell within reach.   Therapy Documentation Precautions:  Precautions Precautions: Fall Precaution Comments: monitor O2 Restrictions Weight Bearing Restrictions: Yes LLE Weight Bearing: Partial weight bearing LLE Partial Weight Bearing Percentage or Pounds: 50 General:   Pain: Pt c/o mild L hip pain.   Locomotion : Ambulation Ambulation/Gait Assistance: 4: Min guard;5: Supervision   See FIM for current functional status  Therapy/Group: Individual Therapy  Junaid, Wurzer 06/09/2014, 12:27 PM

## 2014-06-10 ENCOUNTER — Inpatient Hospital Stay (HOSPITAL_COMMUNITY): Payer: Medicare Other

## 2014-06-10 ENCOUNTER — Inpatient Hospital Stay (HOSPITAL_COMMUNITY): Payer: Medicare Other | Admitting: Occupational Therapy

## 2014-06-10 ENCOUNTER — Encounter (HOSPITAL_COMMUNITY): Payer: Medicare Other | Admitting: Occupational Therapy

## 2014-06-10 DIAGNOSIS — S72002S Fracture of unspecified part of neck of left femur, sequela: Secondary | ICD-10-CM

## 2014-06-10 DIAGNOSIS — J439 Emphysema, unspecified: Secondary | ICD-10-CM

## 2014-06-10 LAB — CBC WITH DIFFERENTIAL/PLATELET
BASOS ABS: 0 10*3/uL (ref 0.0–0.1)
BASOS PCT: 1 % (ref 0–1)
Eosinophils Absolute: 0.2 10*3/uL (ref 0.0–0.7)
Eosinophils Relative: 3 % (ref 0–5)
HCT: 37.4 % — ABNORMAL LOW (ref 39.0–52.0)
HEMOGLOBIN: 12 g/dL — AB (ref 13.0–17.0)
Lymphocytes Relative: 23 % (ref 12–46)
Lymphs Abs: 1.8 10*3/uL (ref 0.7–4.0)
MCH: 30.2 pg (ref 26.0–34.0)
MCHC: 32.1 g/dL (ref 30.0–36.0)
MCV: 94.2 fL (ref 78.0–100.0)
MONOS PCT: 10 % (ref 3–12)
Monocytes Absolute: 0.8 10*3/uL (ref 0.1–1.0)
NEUTROS ABS: 5 10*3/uL (ref 1.7–7.7)
NEUTROS PCT: 63 % (ref 43–77)
Platelets: 220 10*3/uL (ref 150–400)
RBC: 3.97 MIL/uL — ABNORMAL LOW (ref 4.22–5.81)
RDW: 13.2 % (ref 11.5–15.5)
WBC: 7.9 10*3/uL (ref 4.0–10.5)

## 2014-06-10 LAB — COMPREHENSIVE METABOLIC PANEL
ALK PHOS: 80 U/L (ref 39–117)
ALT: 38 U/L (ref 0–53)
ANION GAP: 9 (ref 5–15)
AST: 41 U/L — ABNORMAL HIGH (ref 0–37)
Albumin: 2.9 g/dL — ABNORMAL LOW (ref 3.5–5.2)
BUN: 21 mg/dL (ref 6–23)
CO2: 31 mEq/L (ref 19–32)
CREATININE: 1.06 mg/dL (ref 0.50–1.35)
Calcium: 8.9 mg/dL (ref 8.4–10.5)
Chloride: 99 mEq/L (ref 96–112)
GFR calc non Af Amer: 62 mL/min — ABNORMAL LOW (ref >90)
GFR, EST AFRICAN AMERICAN: 72 mL/min — AB (ref >90)
GLUCOSE: 107 mg/dL — AB (ref 70–99)
POTASSIUM: 4.6 meq/L (ref 3.7–5.3)
Sodium: 139 mEq/L (ref 137–147)
TOTAL PROTEIN: 6.3 g/dL (ref 6.0–8.3)
Total Bilirubin: 0.5 mg/dL (ref 0.3–1.2)

## 2014-06-10 NOTE — Progress Notes (Signed)
Occupational Therapy Session Note  Patient Details  Name: [name] MRN: [id number] Date of Birth: [birth year]  Today's Date: 06/10/2014 OT Individual Time: 0930-1030 OT Individual Time Calculation (min): 60 min   Skilled Therapeutic Interventions/Progress Updates:  Patient found seated in recliner with no complaints of pain. Patient eager to d/c prior to Christmas Eve. Patient stood with RW and distant supervision, patient then ambulated around room to gather necessary clothing and items for shower. Patient ambulated > ADL apartment, transferred onto couch, 02 sats=90% on 3 liters of 02. Therapist encouraged pursed lip breathing and patient ambulated > tub/shower for transfer onto tub transfer bench. Patient then completed ADL retraining in seated position. Patient distant supervision for this. Patient transferred out of tub, requiring min verbal cues for safety. UB/LB dressing completed with distant supervision and patient then ambulated back to couch for therapist to don bilateral TEDs. Patient worked on Animal nutritionist and therapist educated patient on how bending over constricts lungs making it harder to breathe. Therapist recommended patient cross feet over, bringing them up to him to tie; patient refused stated he wanted to do it his way. Patient ambulated back to room and left seated in recliner with all needs within reach. Patient required up to moderate verbal cues for pursed lip breathing secondary to shortness of breath during session. Patient's sats ranged from 87-93% and would increase within a few seconds if patient performed pursed lip breathing.   Precautions:  Precautions Precautions: Fall Precaution Comments: monitor O2 Restrictions Weight Bearing Restrictions: Yes LLE Weight Bearing: Partial weight bearing LLE Partial Weight Bearing Percentage or Pounds: 50  See FIM for current functional status  Therapy/Group: Individual Therapy  Goldia Ligman 06/10/2014,  10:41 AM
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
## 2014-06-10 NOTE — Progress Notes (Signed)
Occupational Therapy Session Note  Patient Details  Name: [name] MRN: [id number] Date of Birth: 20-Sep-1927  Today's Date: 06/10/2014 OT Individual Time: 1300-1330 OT Individual Time Calculation (min): 30 min    Short Term Goals: Week 1:   STG = LTGs due to ELOS  Skilled Therapeutic Interventions/Progress Updates:    Engaged in therapeutic activity with focus on functional mobility, activity tolerance, and transfers.  Pt ambulated to ADL apt with RW with overall supervision. Upon arrival to apt pt required rest break, educated on pursed lip breathing.  O2 94% on 3L O2 after 2-3 mins rest (unable to get a reading initially).  Performed toilet transfers and toileting in ADL apt with supervision.  Educated on proper technique and hand placement with sit > stand with RW.  Returned to room with supervision with use of RW and returned to recliner with all needs in reach.  Therapy Documentation Precautions:  Precautions Precautions: Fall Precaution Comments: monitor O2 Restrictions Weight Bearing Restrictions: Yes LLE Weight Bearing: Partial weight bearing LLE Partial Weight Bearing Percentage or Pounds: 50 General:   Vital Signs: Therapy Vitals Temp: 98.9 F (37.2 C) Temp Source: Oral Pulse Rate: 80 Resp: 18 BP: 119/67 mmHg Patient Position (if appropriate): Sitting Oxygen Therapy SpO2: 100 % O2 Device: Nasal Cannula O2 Flow Rate (L/min): 3 L/min Pain:  Pt with no c/o pain ADL: ADL ADL Comments: Refer to FIM  See FIM for current functional status  Therapy/Group: Individual Therapy  Simonne Come 06/10/2014, 4:09 PM

## 2014-06-10 NOTE — Progress Notes (Signed)
Physical Therapy Session Note  Patient Details  Name: [name] MRN: [id number] Date of Birth: [birth year]  Today's Date: 06/10/2014  Short Term Goals: Week 1:  PT Short Term Goal 1 (Week 1): ST=LTG  Session #1: Time: 800-900 (60 min) Denies pain at this time. Assisted pt off toilet at S level and gait using RW back to EOB to don shoes and Tedhose. Discussed goals in regard to pt's desire to d/c before Christmas (Friday). Gait down to therapy gym with 1 standing rest break using RW and cues for pursed lip breathing for functional mobility and endurance training at S level. Stair negotiation for 1 step for home entry simulation with steady A approaching S using RW (cues for which foot to lead with going up/down first and to maintain PWB status). Practiced bed mobility on flat surface from height of bed at home overall mod I for mobility. Car transfer with RW with overall S for cues for technique and hand placement (not to pull up on RW) in preparation for d/c. Gait trials with RW with overall S; cues for posture and standing rest breaks needed for longer distances with cues for pursed lip breathing. O2 remained > 94% on 3 L O2. Positioned in recliner end of session with all needs in place.    Session #2: Time: 1100-1130 (30 min) Denies pain. Session focused on functional gait with RW and focus on endurance and activity tolerance > 150' x 2 with a standing rest break on the way down and the way back. Cues for pursed lip breathing when SOB. Education on energy conservation techniques in the home as well. Anticipate pt will be ready for d/c home Wed at [facility] I/S level with RW and will discuss with primary team.   Therapy Documentation Precautions:  Precautions Precautions: Fall Precaution Comments: monitor O2 Restrictions Weight Bearing Restrictions: Yes LLE Weight Bearing: Partial weight bearing LLE Partial Weight Bearing Percentage or Pounds: 50  See FIM for current functional  status  Therapy/Group: Individual Therapy  [name] [facility] 06/10/2014, 11:37 AM

## 2014-06-10 NOTE — Progress Notes (Signed)
80 y.o. male with history of COPD, HTN, who slipped and fell on to his left hip while dragging his trash can on 06/03/14. He had difficulty weight bearing on left leg and was admitted for workup. X rays done revealing subcapital left femoral neck fracture. He was evaluated by Dr. Alvan Dame and underwent closed reduction with pinning left femur on 06/04/14. Post op is PWB LLE Subjective/Complaints:   Objective: Vital Signs: Blood pressure 150/68, pulse 78, temperature 97.7 F (36.5 C), temperature source Oral, resp. rate 18, height 5' 10" (1.778 m), weight 89 kg (196 lb 3.4 oz), SpO2 99 %. No results found. Results for orders placed or performed during the hospital encounter of 06/07/14 (from the past 72 hour(s))  Urine culture     Status: None   Collection Time: 06/07/14  4:39 PM  Result Value Ref Range   Specimen Description URINE, CLEAN CATCH    Special Requests NONE    Culture  Setup Time      06/07/2014 22:56 Performed at [facility]      1,000 COLONIES/ML Performed at [employer]      INSIGNIFICANT GROWTH Performed at [facility]    Report Status 06/08/2014 FINAL   Urinalysis, Routine w reflex microscopic     Status: None   Collection Time: 06/07/14  4:39 PM  Result Value Ref Range   Color, Urine YELLOW YELLOW   APPearance CLEAR CLEAR   Specific Gravity, Urine 1.010 1.005 - 1.030   pH 6.0 5.0 - 8.0   Glucose, UA NEGATIVE NEGATIVE mg/dL   Hgb urine dipstick NEGATIVE NEGATIVE   Bilirubin Urine NEGATIVE NEGATIVE   Ketones, ur NEGATIVE NEGATIVE mg/dL   Protein, ur NEGATIVE NEGATIVE mg/dL   Urobilinogen, UA 0.2 0.0 - 1.0 mg/dL   Nitrite NEGATIVE NEGATIVE   Leukocytes, UA NEGATIVE NEGATIVE    Comment: MICROSCOPIC NOT DONE ON URINES WITH NEGATIVE PROTEIN, BLOOD, LEUKOCYTES, NITRITE, OR GLUCOSE <1000 mg/dL.  Comprehensive metabolic panel     Status: Abnormal   Collection Time: 06/10/14  4:47 AM  Result Value Ref Range   Sodium 139 137 - 147 mEq/L   Potassium 4.6 3.7 - 5.3 mEq/L   Chloride 99 96 - 112 mEq/L   CO2 31 19 - 32 mEq/L   Glucose, Bld 107 (H) 70 - 99 mg/dL   BUN 21 6 - 23 mg/dL   Creatinine, Ser 1.06 0.50 - 1.35 mg/dL   Calcium 8.9 8.4 - 10.5 mg/dL   Total Protein 6.3 6.0 - 8.3 g/dL   Albumin 2.9 (L) 3.5 - 5.2 g/dL   AST 41 (H) 0 - 37 U/L   ALT 38 0 - 53 U/L   Alkaline Phosphatase 80 39 - 117 U/L   Total Bilirubin 0.5 0.3 - 1.2 mg/dL   GFR calc non Af Amer 62 (L) >90 mL/min   GFR calc Af Amer 72 (L) >90 mL/min    Comment: (NOTE) The eGFR has been calculated using the CKD EPI equation. This calculation has not been validated in all clinical situations. eGFR's persistently <90 mL/min signify possible Chronic Kidney Disease.    Anion gap 9 5 - 15  CBC WITH DIFFERENTIAL     Status: Abnormal   Collection Time: 06/10/14  4:47 AM  Result Value Ref Range   WBC 7.9 4.0 - 10.5 K/uL   RBC 3.97 (L) 4.22 - 5.81 MIL/uL   Hemoglobin 12.0 (L) 13.0 - 17.0 g/dL  HCT 37.4 (L) 39.0 - 52.0 %   MCV 94.2 78.0 - 100.0 fL   MCH 30.2 26.0 - 34.0 pg   MCHC 32.1 30.0 - 36.0 g/dL   RDW 13.2 11.5 - 15.5 %   Platelets 220 150 - 400 K/uL   Neutrophils Relative % 63 43 - 77 %   Neutro Abs 5.0 1.7 - 7.7 K/uL   Lymphocytes Relative 23 12 - 46 %   Lymphs Abs 1.8 0.7 - 4.0 K/uL   Monocytes Relative 10 3 - 12 %   Monocytes Absolute 0.8 0.1 - 1.0 K/uL   Eosinophils Relative 3 0 - 5 %   Eosinophils Absolute 0.2 0.0 - 0.7 K/uL   Basophils Relative 1 0 - 1 %   Basophils Absolute 0.0 0.0 - 0.1 K/uL     HEENT: normal Cardio: RRR and no murmur Resp: CTA B/L and unlabored GI: BS positive and NT, ND Extremity:  No Edema Skin:   Wound C/D/I Neuro: Alert/Oriented Musc/Skel:  Swelling LLE Gen NAD   Assessment/Plan: 1. Functional deficits secondary to Left femoral neck fracture s/p closed reduction which require 3+ hours per day of interdisciplinary therapy in a comprehensive inpatient rehab setting. Physiatrist  is providing close team supervision and 24 hour management of active medical problems listed below. Physiatrist and rehab team continue to assess barriers to discharge/monitor patient progress toward functional and medical goals. FIM: FIM - Bathing Bathing Steps Patient Completed: Chest, Right Arm, Left Arm, Abdomen, Left upper leg, Right upper leg, Buttocks, Front perineal area Bathing: 4: Min-Patient completes 8-9 10f10 parts or 75+ percent  FIM - Upper Body Dressing/Undressing Upper body dressing/undressing steps patient completed: Thread/unthread right sleeve of pullover shirt/dresss, Thread/unthread left sleeve of pullover shirt/dress, Put head through opening of pull over shirt/dress, Pull shirt over trunk Upper body dressing/undressing: 5: Set-up assist to: Obtain clothing/put away FIM - Lower Body Dressing/Undressing Lower body dressing/undressing steps patient completed: Thread/unthread right pants leg, Thread/unthread left pants leg, Pull pants up/down, Don/Doff left shoe, Don/Doff right shoe (no underwear by pt choice) Lower body dressing/undressing: 4: Min-Patient completed 75 plus % of tasks  FIM - Toileting Toileting steps completed by patient: Performs perineal hygiene, Adjust clothing prior to toileting, Adjust clothing after toileting Toileting: 5: Supervision: Safety issues/verbal cues  FIM - TRadio producerDevices: WEnvironmental consultant GProduct managerTransfers: 5-To toilet/BSC: Supervision (verbal cues/safety issues), 5-From toilet/BSC: Supervision (verbal cues/safety issues)  FIM - BControl and instrumentation engineerDevices: Walker, Bed rails, HOB elevated Bed/Chair Transfer: 5: Supine > Sit: Supervision (verbal cues/safety issues), 5: Bed > Chair or W/C: Supervision (verbal cues/safety issues), 5: Chair or W/C > Bed: Supervision (verbal cues/safety issues)  FIM - Locomotion: Wheelchair Distance: 100 Locomotion: Wheelchair: 5: Travels 150  ft or more: maneuvers on rugs and over door sills with supervision, cueing or coaxing FIM - Locomotion: Ambulation Locomotion: Ambulation Assistive Devices: WAdministratorAmbulation/Gait Assistance: 4: Min guard, 5: Supervision Locomotion: Ambulation: 2: Travels 50 - 149 ft with minimal assistance (Pt.>75%)  Comprehension Comprehension: 7-Follows complex conversation/direction: With no assist  Expression Expression: 7-Expresses complex ideas: With no assist  Social Interaction Social Interaction: 7-Interacts appropriately with others - No medications needed.  Problem Solving Problem Solving: 7-Solves complex problems: Recognizes & self-corrects  Memory Memory: 7-Complete Independence: No helper  Medical Problem List and Plan: 1. Functional deficits secondary to Left femoral neck fracture s/p closed reduction/pinnning 2. DVT Prophylaxis/Anticoagulation: Pharmaceutical: Lovenox 3. Pain Management: hydrocodone and robaxin prn  4. Mood: pt in good spirits. Team to provide egosupport 5. Neuropsych: This patient is capabable of making decisions on his own behalf. 6. Skin/Wound Care: local care to incision. Decubitus precautions. Encourage appropriate intake 7. Fluids/Electrolytes/Nutrition: encourage po, follow up labwork on admit 8. COPD: spiriva, oxygen as needed 9. ABLA: counts fairly robust- vitamin/dietary sources 10. HTN: normotensive.  11. Urinary retention: pvr's low. I/o cath prn. Seems to be voiding fairly well over the last 48 hours  LOS (Days) 3 A FACE TO FACE EVALUATION WAS PERFORMED  [name],[name] 06/10/2014, 7:23 AM

## 2014-06-11 ENCOUNTER — Inpatient Hospital Stay (HOSPITAL_COMMUNITY): Payer: Medicare Other

## 2014-06-11 ENCOUNTER — Encounter (HOSPITAL_COMMUNITY): Payer: Self-pay | Admitting: Orthopedic Surgery

## 2014-06-11 ENCOUNTER — Encounter (HOSPITAL_COMMUNITY): Payer: Medicare Other | Admitting: Occupational Therapy

## 2014-06-11 NOTE — Progress Notes (Signed)
Physical Therapy Session Note  Patient Details  Name: [name] MRN: [id number] Date of Birth: 10/14/1927  Today's Date: 06/11/2014 PT Individual Time: 1300-1400 PT Individual Time Calculation (min): 60 min   Short Term Goals: Week 1:  PT Short Term Goal 1 (Week 1): ST=LTG  Skilled Therapeutic Interventions/Progress Updates:    Session focused on functional gait with RW for general strengthening and endurance overall mod I on unit and able to go all the way to the gym without standing rest break, stair negotiation with bilateral rails and cues for PWB status for community mobility training, and introduced LE therex HEP in standing and supine for functional strengthening (standing L hip abduction, extension and hamstring curls; supine heel slides, SAQ, hip abduction, glut and quad sets and LAQ x 2 sets of 10 reps each). Dynamic gait through obstacle course for household and community mobility with RW navigating cones and stepping over simulated threshold 25' x 2; cues intermittently to stay inside RW during turns to decrease fall risk. Gait back to room mod I with RW and sats monitored throughout >90% on 2 LO2. Pt verbalizes that he feels ready for d/c tomorrow.   Therapy Documentation Precautions:  Precautions Precautions: Fall Precaution Comments: 3L O2 Restrictions Weight Bearing Restrictions: Yes LLE Weight Bearing: Partial weight bearing LLE Partial Weight Bearing Percentage or Pounds: 50  Pain:  Denies pain.  See FIM for current functional status  Therapy/Group: Individual Therapy  [name] [facility] 06/11/2014, 2:01 PM

## 2014-06-11 NOTE — Progress Notes (Signed)
Physical Therapy Discharge Summary  Patient Details  Name: [name] MRN: [id number] Date of Birth: [birth year]  Patient has met 6 of 6 long term goals due to improved activity tolerance, improved balance, increased strength, decreased pain, ability to compensate for deficits and functional use of  left lower extremity.  Pt continues with PWB to LLE. Patient to discharge at an ambulatory level Modified Independent with RW and using O2.  Pt is able to direct care if needed.  Reasons goals not met: n/a - all goals met at this time.  Recommendation:  Patient will benefit from ongoing skilled PT services in home health setting to continue to advance safe functional mobility, address ongoing impairments in gait, endurance, balance, oxygen support, strength, muscular endurance, and minimize fall risk.  Equipment: RW  Reasons for discharge: treatment goals met and discharge from hospital  Patient/family agrees with progress made and goals achieved: Yes  PT Discharge Precautions/Restrictions Precautions Precautions: Fall Precaution Comments: 3L O2 Restrictions Weight Bearing Restrictions: Yes LLE Weight Bearing: Partial weight bearing LLE Partial Weight Bearing Percentage or Pounds: 50  Cognition Overall Cognitive Status: Within Functional Limits for tasks assessed Safety/Judgment: Appears intact Sensation Sensation Light Touch: Appears Intact Proprioception: Appears Intact Coordination Gross Motor Movements are Fluid and Coordinated: Yes Motor  Motor Motor: Within Functional Limits  Locomotion  Ambulation Ambulation/Gait Assistance: 6: Modified independent (Device/Increase time)  Trunk/Postural Assessment  Cervical Assessment Cervical Assessment: Within Functional Limits Thoracic Assessment Thoracic Assessment: Within Functional Limits Lumbar Assessment Lumbar Assessment: Within Functional Limits Postural Control Postural Control: Within Functional Limits   Balance Balance Balance Assessed: Yes Static Standing Balance Static Standing - Level of Assistance: 6: Modified independent (Device/Increase time) (with RW) Dynamic Standing Balance Dynamic Standing - Level of Assistance: 6: Modified independent (Device/Increase time) (RW) Extremity Assessment  RUE Assessment RUE Assessment: Within Functional Limits LUE Assessment LUE Assessment: Within Functional Limits RLE Assessment RLE Assessment: Within Functional Limits (decreased muscular endurance) LLE Assessment LLE Assessment: Exceptions to [facility] LLE Strength LLE Overall Strength Comments: hip grossly 3+/5; knee and ankle WFL; decreased muscular endurance  See FIM for current functional status  [name] [facility] 06/11/2014, 2:04 PM

## 2014-06-11 NOTE — IPOC Note (Signed)
Overall Plan of Care [facility]) Patient Details Name: [name] MRN: [id number] DOB: 01-Sep-1927  Admitting Diagnosis: FEM ORIF  Hospital Problems: Principal Problem:   Fracture of femoral neck, left Active Problems:   COPD with emphysema     Functional Problem List: Nursing Bowel, Endurance, Pain, Safety, Skin Integrity  PT Endurance, Motor  OT Balance, Endurance  SLP    TR         Basic ADL's: OT Bathing, Dressing, Toileting     Advanced  ADL's: OT       Transfers: PT Bed to Chair  OT Toilet, Tub/Shower     Locomotion: PT Ambulation, Wheelchair Mobility, Stairs     Additional Impairments: OT None  SLP        TR      Anticipated Outcomes Item Anticipated Outcome  Self Feeding I  Swallowing      Basic self-care  mod I  Toileting  mod I   Bathroom Transfers mod I to toilet, supervision to tub bench  Bowel/Bladder  MODI  Transfers  Modified independance for transfers  Locomotion  Modified indeopendance with short distance ambulation  Communication     Cognition     Pain  LESS THAN 3  Safety/Judgment  MODI   Therapy Plan: PT Intensity: Minimum of 1-2 x/day ,45 to 90 minutes PT Frequency: 5 out of 7 days PT Duration Estimated Length of Stay: 5-7 days OT Intensity: Minimum of 1-2 x/day, 45 to 90 minutes OT Frequency: 5 out of 7 days OT Duration/Estimated Length of Stay: 5-7 days         Team Interventions: Nursing Interventions Patient/Family Education, Pain Management, Bowel Management, Skin Care/Wound Management, Medication Management, Discharge Planning  PT interventions Ambulation/gait training, Neuromuscular re-education, Patient/family education, Therapeutic Activities, Functional mobility training, Discharge planning, Therapeutic Exercise  OT Interventions Balance/vestibular training, Discharge planning, DME/adaptive equipment instruction, Functional mobility training, Patient/family education, Self Care/advanced ADL retraining,  Therapeutic Activities  SLP Interventions    TR Interventions    SW/CM Interventions Discharge Planning, Psychosocial Support, Patient/Family Education    Team Discharge Planning: Destination: PT-Home ,OT- Home , SLP-  Projected Follow-up: PT-Home health PT, OT-  None, SLP-  Projected Equipment Needs: PT-To be determined, OT- 3 in 1 bedside comode, Tub/shower bench, To be determined (Pt's wife may have access to this equipment.), SLP-  Equipment Details: PT- , OT-  Patient/family involved in discharge planning: PT- Patient, Family member/caregiver,  OT-Patient, Family member/caregiver, SLP-   MD ELOS: 7d Medical Rehab Prognosis:  Good Assessment: 80 y.o. male with history of COPD, HTN, who slipped and fell on to his left hip while dragging his trash can on 06/03/14. He had difficulty weight bearing on left leg and was admitted for workup. X rays done revealing subcapital left femoral neck fracture. He was evaluated by Dr. Alvan Dame and underwent closed reduction with pinning left femur on 06/04/14. Post op is PWB LLE and on Lovenox for DVT prophylaxis   Now requiring 24/7 Rehab RN,MD, as well as CIR level PT, OT and SLP.  Treatment team will focus on ADLs and mobility with goals set at Mod I  See Team Conference Notes for weekly updates to the plan of care

## 2014-06-11 NOTE — Progress Notes (Addendum)
Occupational Therapy Session Note & Discharge Summary  Patient Details  Name: Kay E Julin MRN: [id number] Date of Birth: 02/02/1928  Today's Date: 06/11/2014   SESSION NOTE OT Individual Time: 0930-1030 OT Individual Time Calculation (min): 60 min  Patient received seated in recliner on 2 liters of 02 with no complaints of pain. Patient stood with RW and ambulated around room to gather necessary items for his ADL. After gathering items, patient sat for seated rest break, sats=87% and patient performed pursed lip breathing. Sats quickly increased to <90% and patient ambulated > ADL apartment. Patient sat on couch and took rest break. Patient then ambulated into BR for tub/shower transfer and UB/LB bathing in sit<>stand position, using LH sponge for bilateral feet. Patient ambulated to couch for donning of socks and shoes, then ambulated back to room. Patient overall mod I for ADL tasks. Left patient seated in recliner with all needs within reach. Patient took seated rest breaks and performed pursed lip breathing as needed and appropriately throughout ADL session. Patient is excited to discharge > home tomorrow.  -----------------------------------------------------------------------------------------------------  DISCHARGE SUMMARY  Patient has met 6 of 6 long term goals due to improved activity tolerance, improved balance, postural control, ability to compensate for deficits, improved attention, improved awareness and improved coordination.  Patient to discharge at overall Modified Independent level.  No family present for family education. Patient states his wife will be able to provide the necessary supervision prn and assist with all IADL tasks.   Reasons goals not met: n/a, all goals met at this time.   Recommendation: No additional occupational therapy recommended at this time.  Equipment: tub transfer bench and 3-in-1  Reasons for discharge: treatment goals met and discharge from  hospital  Patient/family agrees with progress made and goals achieved: Yes  Precautions/Restrictions  Precautions Precautions: Fall Precaution Comments: 3L O2 Restrictions Weight Bearing Restrictions: Yes LLE Weight Bearing: Partial weight bearing LLE Partial Weight Bearing Percentage or Pounds: 50   Vital Signs Therapy Vitals BP: (!) 136/47 mmHg Patient Position (if appropriate): Sitting Oxygen Therapy SpO2: 98 % O2 Device: Nasal Cannula O2 Flow Rate (L/min): 2 L/min   ADL ADL ADL Comments: Refer to FIM   Vision/Perception  Vision- History Baseline Vision/History: Wears glasses Wears Glasses: At all times Patient Visual Report: No change from baseline Vision- Assessment Vision Assessment?: No apparent visual deficits   Cognition Overall Cognitive Status: Within Functional Limits for tasks assessed Arousal/Alertness: Awake/alert Orientation Level: Oriented X4 Focused Attention: Appears intact Memory: Appears intact Awareness: Appears intact Problem Solving: Appears intact Safety/Judgment: Appears intact   Sensation Sensation Light Touch: Appears Intact Proprioception: Appears Intact Coordination Gross Motor Movements are Fluid and Coordinated: Yes Fine Motor Movements are Fluid and Coordinated: Yes   Motor  Motor Motor: Within Functional Limits   Trunk/Postural Assessment  Cervical Assessment Cervical Assessment: Within Functional Limits Thoracic Assessment Thoracic Assessment: Within Functional Limits Lumbar Assessment Lumbar Assessment: Within Functional Limits Postural Control Postural Control: Within Functional Limits   Balance Balance Balance Assessed: Yes Static Standing Balance Static Standing - Level of Assistance: 6: Modified independent (Device/Increase time) (with RW) Dynamic Standing Balance Dynamic Standing - Level of Assistance: 6: Modified independent (Device/Increase time) (RW)   Extremity/Trunk Assessment RUE Assessment RUE  Assessment: Within Functional Limits LUE Assessment LUE Assessment: Within Functional Limits  See FIM for current functional status  CLAY,PATRICIA 06/11/2014, 11:39 AM  

## 2014-06-11 NOTE — Progress Notes (Signed)
Physical Therapy Session Note  Patient Details  Name: [name] MRN: [id number] Date of Birth: 09-07-1927  Today's Date: 06/11/2014 PT Individual Time: 0800-0900 PT Individual Time Calculation (min): 60 min   Short Term Goals: Week 1:  PT Short Term Goal 1 (Week 1): ST=LTG  Skilled Therapeutic Interventions/Progress Updates:   Pt presents in bed needing to use bathroom. Pt able to perform bed mobility and use RW while managing O2 tubing at mod I level to walk to and from bathroom, perform hygiene at toilet and hand hygiene standing at sink, and then don socks seated EOB to prepare for therapy. Focused on gait with RW for endurance and strengthening on unit multiple bouts with standing rest breaks as needed due to SOB (able to perform deep breathing independently). Repeated simulated car transfer at mod I level with RW, managing door as well. Stair negotiation for home entry with RW to go up/down 1 step at supervision level; able to recall correct sequence of which foot to lead with. Performed functional household gait and bed mobility in ADL apartment to simulate home environment at mod I level as well. D/c assessment initiated (see d/c summary for details). O2 on 3 L during session at 98%; trial without O2 on room air during gait and maintained > 90%. After long distance gait > 200' pt SOB and sats at 84%; donned 2 L O2 and returned > 90% within less than 2 min standing rest break. Notified RN of trials without O2. Pt continues to feel ready for d/c tomorrow and was safe with all mobility this AM. Pt made mod I in room with RW and RN made aware.  Therapy Documentation Precautions:  Precautions Precautions: Fall Precaution Comments: 3L O2 Restrictions Weight Bearing Restrictions: Yes LLE Weight Bearing: Partial weight bearing LLE Partial Weight Bearing Percentage or Pounds: 50  Pain:  Denies pain.  See FIM for current functional status  Therapy/Group: Individual Therapy  [name] [facility] 06/11/2014, 9:59 AM

## 2014-06-11 NOTE — Progress Notes (Signed)
80 y.o. male with history of COPD, HTN, who slipped and fell on to his left hip while dragging his trash can on 06/03/14. He had difficulty weight bearing on left leg and was admitted for workup. X rays done revealing subcapital left femoral neck fracture. He was evaluated by Dr. Alvan Dame and underwent closed reduction with pinning left femur on 06/04/14. Post op is PWB LLE Subjective/Complaints: No pain c/o No new issues overnite, No breathing problems , no bowel or bladder issues  Review of Systems - Negative except as above  Objective: Vital Signs: Blood pressure 141/61, pulse 80, temperature 98.4 F (36.9 C), temperature source Oral, resp. rate 20, height _0  (1.778 m), weight 89 kg (196 lb 3.4 oz), SpO2 98 %. No results found. Results for orders placed or performed during the hospital encounter of 06/07/14 (from the past 72 hour(s))  Comprehensive metabolic panel     Status: Abnormal   Collection Time: 06/10/14  4:47 AM  Result Value Ref Range   Sodium 139 137 - 147 mEq/L   Potassium 4.6 3.7 - 5.3 mEq/L   Chloride 99 96 - 112 mEq/L   CO2 31 19 - 32 mEq/L   Glucose, Bld 107 (H) 70 - 99 mg/dL   BUN 21 6 - 23 mg/dL   Creatinine, Ser 1.06 0.50 - 1.35 mg/dL   Calcium 8.9 8.4 - 10.5 mg/dL   Total Protein 6.3 6.0 - 8.3 g/dL   Albumin 2.9 (L) 3.5 - 5.2 g/dL   AST 41 (H) 0 - 37 U/L   ALT 38 0 - 53 U/L   Alkaline Phosphatase 80 39 - 117 U/L   Total Bilirubin 0.5 0.3 - 1.2 mg/dL   GFR calc non Af Amer 62 (L) >90 mL/min   GFR calc Af Amer 72 (L) >90 mL/min    Comment: (NOTE) The eGFR has been calculated using the CKD EPI equation. This calculation has not been validated in all clinical situations. eGFR's persistently <90 mL/min signify possible Chronic Kidney Disease.    Anion gap 9 5 - 15  CBC WITH DIFFERENTIAL     Status: Abnormal   Collection Time: 06/10/14  4:47 AM  Result Value Ref Range   WBC 7.9 4.0 - 10.5 K/uL   RBC 3.97 (L) 4.22 - 5.81 MIL/uL   Hemoglobin 12.0 (L) 13.0  - 17.0 g/dL   HCT 37.4 (L) 39.0 - 52.0 %   MCV 94.2 78.0 - 100.0 fL   MCH 30.2 26.0 - 34.0 pg   MCHC 32.1 30.0 - 36.0 g/dL   RDW 13.2 11.5 - 15.5 %   Platelets 220 150 - 400 K/uL   Neutrophils Relative % 63 43 - 77 %   Neutro Abs 5.0 1.7 - 7.7 K/uL   Lymphocytes Relative 23 12 - 46 %   Lymphs Abs 1.8 0.7 - 4.0 K/uL   Monocytes Relative 10 3 - 12 %   Monocytes Absolute 0.8 0.1 - 1.0 K/uL   Eosinophils Relative 3 0 - 5 %   Eosinophils Absolute 0.2 0.0 - 0.7 K/uL   Basophils Relative 1 0 - 1 %   Basophils Absolute 0.0 0.0 - 0.1 K/uL     HEENT: normal Cardio: RRR and no murmur Resp: CTA B/L and unlabored GI: BS positive and NT, ND Extremity:  No Edema Skin:   Wound C/D/I Neuro: Alert/Oriented Musc/Skel:  Swelling LLE Gen NAD   Assessment/Plan: 1. Functional deficits secondary to Left femoral neck fracture s/p closed reduction which  require 3+ hours per day of interdisciplinary therapy in a comprehensive inpatient rehab setting. Physiatrist is providing close team supervision and 24 hour management of active medical problems listed below. Physiatrist and rehab team continue to assess barriers to discharge/monitor patient progress toward functional and medical goals. FIM: FIM - Bathing Bathing Steps Patient Completed: Chest, Right Arm, Left Arm, Abdomen, Left upper leg, Right upper leg, Buttocks, Front perineal area, Right lower leg (including foot), Left lower leg (including foot) Bathing: 5: Supervision: Safety issues/verbal cues  FIM - Upper Body Dressing/Undressing Upper body dressing/undressing steps patient completed: Thread/unthread right sleeve of pullover shirt/dresss, Thread/unthread left sleeve of pullover shirt/dress, Put head through opening of pull over shirt/dress, Pull shirt over trunk Upper body dressing/undressing: 6: Assistive device (Comment) FIM - Lower Body Dressing/Undressing Lower body dressing/undressing steps patient completed: Thread/unthread right  pants leg, Thread/unthread left pants leg, Pull pants up/down, Don/Doff left shoe, Don/Doff right shoe Lower body dressing/undressing: 5: Supervision: Safety issues/verbal cues  FIM - Toileting Toileting steps completed by patient: Performs perineal hygiene, Adjust clothing prior to toileting, Adjust clothing after toileting Toileting Assistive Devices: Grab bar or rail for support Toileting: 5: Supervision: Safety issues/verbal cues  FIM - Radio producer Devices: Environmental consultant, Product manager Transfers: 5-To toilet/BSC: Supervision (verbal cues/safety issues), 5-From toilet/BSC: Supervision (verbal cues/safety issues)  FIM - Control and instrumentation engineer Devices: Copy: 6: Supine > Sit: No assist, 6: Sit > Supine: No assist, 5: Bed > Chair or W/C: Supervision (verbal cues/safety issues), 5: Chair or W/C > Bed: Supervision (verbal cues/safety issues)  FIM - Locomotion: Wheelchair Distance: 100 Locomotion: Wheelchair: 0: Activity did not occur FIM - Locomotion: Ambulation Locomotion: Ambulation Assistive Devices: Administrator Ambulation/Gait Assistance: 5: Supervision Locomotion: Ambulation: 2: Travels 50 - 149 ft with supervision/safety issues  Comprehension Comprehension Mode: Auditory Comprehension: 7-Follows complex conversation/direction: With no assist  Expression Expression Mode: Verbal Expression: 7-Expresses complex ideas: With no assist  Social Interaction Social Interaction: 7-Interacts appropriately with others - No medications needed.  Problem Solving Problem Solving: 7-Solves complex problems: Recognizes & self-corrects  Memory Memory: 7-Complete Independence: No helper  Medical Problem List and Plan: 1. Functional deficits secondary to Left femoral neck fracture s/p closed reduction/pinnning 2. DVT Prophylaxis/Anticoagulation: Pharmaceutical: Lovenox 3. Pain Management: hydrocodone and robaxin  prn 4. Mood: pt in good spirits. Team to provide egosupport 5. Neuropsych: This patient is capabable of making decisions on his own behalf. 6. Skin/Wound Care: local care to incision. Decubitus precautions. Encourage appropriate intake 7. Fluids/Electrolytes/Nutrition: encourage po, follow up labwork on admit 8. COPD: spiriva, oxygen as needed 9. ABLA: counts fairly robust- vitamin/dietary sources 10. HTN: normotensive.  11. Urinary retention: pvr's low. I/o cath prn. Seems to be voiding fairly well over the last 48 hours  LOS (Days) 4 A FACE TO FACE EVALUATION WAS PERFORMED  [name] 06/11/2014, 7:27 AM

## 2014-06-12 MED ORDER — METHOCARBAMOL 500 MG PO TABS: 500.0000 mg | ORAL_TABLET | Freq: Four times a day (QID) | ORAL | Status: DC | PRN

## 2014-06-12 MED ORDER — AMLODIPINE BESYLATE 2.5 MG PO TABS: 2.5000 mg | ORAL_TABLET | Freq: Every day | ORAL | Status: DC

## 2014-06-12 MED ORDER — ASPIRIN EC 325 MG PO TBEC: 325.0000 mg | DELAYED_RELEASE_TABLET | Freq: Two times a day (BID) | ORAL | Status: DC

## 2014-06-12 MED ORDER — POLYETHYLENE GLYCOL 3350 17 G PO PACK: 17.0000 g | PACK | Freq: Two times a day (BID) | ORAL | Status: DC

## 2014-06-12 MED ORDER — FOLIC ACID 1 MG PO TABS: 1.0000 mg | ORAL_TABLET | Freq: Every day | ORAL | Status: DC

## 2014-06-12 MED ORDER — TAB-A-VITE/IRON PO TABS
1.0000 | ORAL_TABLET | Freq: Every day | ORAL | Status: DC
Start: 2014-06-12 — End: 2014-12-30

## 2014-06-12 NOTE — Discharge Summary (Signed)
Physician Discharge Summary  Patient ID: [name] MRN: [id number] DOB/AGE: [birth year] 80 y.o.  Admit date: 06/07/2014 Discharge date: 06/12/2014  Discharge Diagnoses:  Principal Problem:   Fracture of femoral neck, left Active Problems:   COPD with emphysema   Discharged Condition: Stable.   Significant Diagnostic Studies: Dg Chest Port 1 View  06/07/2014   CLINICAL DATA:  History of COPD and pulmonary nodule. To begin rehabilitation.  EXAM: PORTABLE CHEST - 1 VIEW  COMPARISON:  06/03/2014 and 10/26/2013.  CT 01/23/2013.  FINDINGS: 1148 hr. The heart size and mediastinal contours are stable. The right lower lobe pulmonary nodule demonstrated by prior CT is not well visualized. Mild emphysematous changes are noted. There is no confluent airspace opacity or pleural effusion.  IMPRESSION: No active cardiopulmonary process. Right lower lobe pulmonary nodule is not radiographically visible.   Electronically Signed   By: Camie Patience M.D.   On: 06/07/2014 12:02    Labs:  Basic Metabolic Panel:  Recent Labs Lab 06/07/14 1205 06/10/14 0447  NA 138 139  K 4.3 4.6  CL 98 99  CO2 32 31  GLUCOSE 91 107*  BUN 21 21  CREATININE 1.21 1.06  CALCIUM 9.0 8.9    CBC:  Recent Labs Lab 06/07/14 1205 06/10/14 0447  WBC 8.9 7.9  NEUTROABS  --  5.0  HGB 12.8* 12.0*  HCT 39.7 37.4*  MCV 95.0 94.2  PLT 200 220    CBG: No results for input(s): GLUCAP in the last 168 hours.  Brief HPI:   [name] is a 80 y.o. male with history of COPD, HTN, who slipped and fell on to his left hip while dragging his trash can on 06/03/14. He had difficulty weight bearing on left leg and was admitted for workup. X rays done revealing subcapital left femoral neck fracture. He was evaluated by Dr. Alvan Dame and underwent closed reduction with pinning left femur on 06/04/14. Post op is PWB LLE and on Lovenox for DVT prophylaxis. He has had difficulty voiding post op as well as ABLA with drop in H/H  to 12.6/37.6. Therapy ongoing and patient limited by acute on chronic SOB with minimal activity. CIR was recommended for follow up therapy.    Hospital Course: [name] was admitted to rehab 06/07/2014 for inpatient therapies to consist of PT and OT at least three hours five days a week. Past admission physiatrist, therapy team and rehab RN have worked together to provide customized collaborative inpatient rehab. Pain has been well controlled and hip incision has healed well without signs/symptoms of infection. Follow up labs show that renal status and ABLA is stable. Blood pressures have been monitored on tid basis and is controlled. Po intake has been good and endurance has slowly improved. He is voiding without difficulty. He continues to require oxygen with all activity and is to follow up with pulmonary in few weeks for input on continued need for oxygen as activity tolerance continues to improve.  He was maintained on Lovenox during his hospitalization and was changed to ASA bid for DVT prophylaxis at discharge.  He has made good progress and is at modified independent level. He will continue to receive follow up HHPT by [facility] past discharge.    Rehab course: During patient's stay in rehab weekly team conferences were held to monitor patient's progress, set goals and discuss barriers to discharge. Patient has had improvement in activity tolerance, balance, postural control, as well as ability to compensate for  deficits. He is able  to ambulate 200 feet with RW at independent level. He is able to complete ADL tasks independently with multiple seated rest breaks. Family will provide assistance wit IADLs as needed past discharge.      Disposition: 01-Home or Self Care  Diet: Regular.  Special Instructions: 1. Partial weight on left leg till cleared by Dr. Alvan Dame.     Medication List    STOP taking these medications        acetaminophen 325 MG tablet  Commonly known as:   TYLENOL     lidocaine 5 % ointment  Commonly known as:  XYLOCAINE      TAKE these medications        amLODipine 2.5 MG tablet  Commonly known as:  NORVASC  Take 1 tablet (2.5 mg total) by mouth daily.     aspirin EC 325 MG tablet  Take 1 tablet (325 mg total) by mouth 2 (two) times daily. Take for 4 weeks     budesonide-formoterol 160-4.5 MCG/ACT inhaler  Commonly known as:  SYMBICORT  Inhale 2 puffs into the lungs 2 (two) times daily.     folic acid 1 MG tablet  Commonly known as:  FOLVITE  Take 1 tablet (1 mg total) by mouth daily.     guaiFENesin-dextromethorphan 100-10 MG/5ML syrup  Commonly known as:  ROBITUSSIN DM  Take 5 mLs by mouth every 4 (four) hours as needed for cough.     HYDROcodone-acetaminophen 5-325 MG per tablet  Commonly known as:  NORCO  Take 1-2 tablets by mouth every 6 (six) hours as needed.     levothyroxine 50 MCG tablet  Commonly known as:  SYNTHROID, LEVOTHROID  Take 50 mcg by mouth daily.     methocarbamol 500 MG tablet  Commonly known as:  ROBAXIN  Take 1 tablet (500 mg total) by mouth every 6 (six) hours as needed for muscle spasms.     multivitamins with iron Tabs tablet  Take 1 tablet by mouth daily.     polyethylene glycol packet  Commonly known as:  MIRALAX / GLYCOLAX  Take 17 g by mouth 2 (two) times daily.     predniSONE 5 MG tablet  Commonly known as:  DELTASONE  Take 0.5 tablets (2.5 mg total) by mouth daily with breakfast.     senna-docusate 8.6-50 MG per tablet  Commonly known as:  Senokot-S  Take 2 tablets by mouth at bedtime.     simvastatin 5 MG tablet  Commonly known as:  ZOCOR  Take 5 mg by mouth at bedtime.     tiotropium 18 MCG inhalation capsule  Commonly known as:  SPIRIVA HANDIHALER  Place 1 capsule (18 mcg total) into inhaler and inhale daily.     VENTOLIN HFA 108 (90 BASE) MCG/ACT inhaler  Generic drug:  albuterol  Inhale 2 puffs into the lungs every 4 (four) hours as needed for shortness of breath.      albuterol (2.5 MG/3ML) 0.083% nebulizer solution  Commonly known as:  PROVENTIL  Take 3 mLs (2.5 mg total) by nebulization every 4 (four) hours as needed for wheezing or shortness of breath.           Follow-up Information    Follow up with Meredith Staggers, MD.   Specialty:  Physical Medicine and Rehabilitation   Why:  As needed   Contact information:   [address], [place] [address] [phone]       Follow up  with Mauri Pole, MD. Call today.   Specialty:  Orthopedic Surgery   Why:  for post op check.   Contact information:   [address] [place] [address] [phone]       Follow up with [name], MD On 06/18/2014.   Specialty:  Internal Medicine   Why:  @ 1:50 pm  (arrive @ 1:35 and bring list of medications)   Contact information:   [place] [address] [phone]       Signed: Bary Leriche 06/12/2014, 12:47 PM

## 2014-06-12 NOTE — Discharge Planning (Signed)
Patient participated in discharge plan. Wife, and daughter present for discharge instruction. Indicate understanding of the discharge plan. Equipment deliver to patient  Room.

## 2014-06-12 NOTE — Progress Notes (Signed)
Social Work Discharge Note  The overall goal for the admission was met for:   Discharge location: Yes - home with wife who can provide 24/7 assistance  Length of Stay: Yes - 5 days  Discharge activity level: Yes - mod independent  Home/community participation: Yes  Services provided included: MD, RD, PT, OT, RN, TR, Pharmacy and [place]: Medicare and Private Insurance: [place]  Follow-up services arranged: Home Health: PT via [facility], DME: rolling walker, 3n1 commode and tub bench via [facility] and Patient/Family has no preference for HH/DME agencies  Comments (or additional information):  Patient/Family verbalized understanding of follow-up arrangements: Yes  Individual responsible for coordination of the follow-up plan: patient  Confirmed correct DME delivered: [name] 06/12/2014    [place], [place]

## 2014-06-12 NOTE — Discharge Instructions (Signed)
Inpatient Rehab Discharge Instructions  [name] Discharge date and time: 06/12/14   Activities/Precautions/ Functional Status: Activity: activity as tolerated. No Driving. Partial weight on Left leg.  Diet: regular diet Wound Care: keep wound clean and dry. Contact Dr. Alvan Dame if you develop redness, drainage or swelling of wound or fever and chills.   Functional status:  ___ No restrictions     ___ Walk up steps independently ___ 24/7 supervision/assistance   ___ Walk up steps with assistance _X__ Intermittent supervision/assistance  ___ Bathe/dress independently _X__ Walk with walker    ___ Bathe/dress with assistance _X__ Walk Independently    ___ Shower independently ___ Walk with assistance    ___ Shower with assistance ___ No alcohol     ___ Return to work/school ________    COMMUNITY REFERRALS UPON DISCHARGE:    Home Health:   PT                      Agency: [facility] Phone: [phone]   Medical Equipment/Items Ordered: walker, commode and tub bench                                                    Agency/Supplier:  [facility]     Special Instructions:    My questions have been answered and I understand these instructions. I will adhere to these goals and the provided educational materials after my discharge from the hospital.  Patient/Caregiver Signature _______________________________ Date __________  Clinician Signature _______________________________________ Date __________  Please bring this form and your medication list with you to all your follow-up doctor's appointments.

## 2014-06-12 NOTE — Patient Care Conference (Signed)
Inpatient RehabilitationTeam Conference and Plan of Care Update Date: 06/11/2014   Time: 2:20 PM    Patient Name: [name]      Medical Record Number: [id number]  Date of Birth: [birth year] Sex: Male         Room/Bed: 4M09C/4M09C-01 Payor Info: Payor: MEDICARE / Plan: MEDICARE PART A AND B / Product Type: *No Product type* /    Admitting Diagnosis: FEM ORIF  Admit Date/Time:  06/07/2014  4:12 PM Admission Comments: No comment available   Primary Diagnosis:  Fracture of femoral neck, left Principal Problem: Fracture of femoral neck, left  Patient Active Problem List   Diagnosis Date Noted  . Fracture of femoral neck, left 06/07/2014  . Hypothyroidism 06/04/2014  . Closed left hip fracture 06/03/2014  . COPD (chronic obstructive pulmonary disease) 06/03/2014  . Hyperlipidemia 06/03/2014  . Hip fracture 06/03/2014  . Chronic respiratory failure with hypoxia 10/27/2013  . COPD with emphysema 07/03/2007    Expected Discharge Date: Expected Discharge Date: 06/12/14  Team Members Present: Physician leading conference: Dr. Alysia Penna Social Worker Present: Lennart Pall, LCSW Nurse Present: Elliot Cousin, RN PT Present: [name], PT OT Present: Salome Spotted, OT;Patricia Lissa Hoard, OT Other (Discipline and Name): Danne Baxter, RN [facility]) PPS Coordinator present : Daiva Nakayama, RN, CRRN     Current Status/Progress Goal Weekly Team Focus  Medical   Pain control is good, chronic O2 only at night at home.  Home discharge medical stability, pain control  Discharge planning   Bowel/Bladder   Continent of bowel and bladder; LBM 12/20; on miralax bid  Continent of bowel and bladder  Offer bathroom PRN   Swallow/Nutrition/ Hydration             ADL's   mod I overall  mod I overall  d/c planning   Mobility   S/mod I overall  S/mod I overall  finalize d/c and goals met   Communication             Safety/Cognition/ Behavioral Observations            Pain   Denies   </=3  Assess pain qshift; offer pain medication prior to therapy    Skin   Incision to left hip  No new skin breakdown  Assess incision qshift    Rehab Goals Patient on target to meet rehab goals: Yes *See Care Plan and progress notes for long and short-term goals.  Barriers to Discharge: None except need some additional  patient and caregiver training    Possible Resolutions to Barriers:  Discharge home tomorrow    Discharge Planning/Teaching Needs:  home with wife who can provide 24/7 assistance      Team Discussion:  Reaching mod i goals and ready for d/c tomorrow  Revisions to Treatment Plan:  None   Continued Need for Acute Rehabilitation Level of Care: The patient requires daily medical management by a physician with specialized training in physical medicine and rehabilitation for the following conditions: Daily direction of a multidisciplinary physical rehabilitation program to ensure safe treatment while eliciting the highest outcome that is of practical value to the patient.: Yes Daily medical management of patient stability for increased activity during participation in an intensive rehabilitation regime.: Yes Daily analysis of laboratory values and/or radiology reports with any subsequent need for medication adjustment of medical intervention for : Post surgical problems;Other;Pulmonary problems  Lavere Stork 06/12/2014, 10:03 AM

## 2014-06-12 NOTE — Progress Notes (Signed)
Social Work Patient ID: [name], male   DOB: 10/30/1927, 80 y.o.   MRN: [id number]   Lennart Pall, LCSW Social Worker Signed  Patient Care Conference 06/12/2014 10:03 AM    Expand All Collapse All   Inpatient RehabilitationTeam Conference and Plan of Care Update Date: 06/11/2014   Time: 2:20 PM     Patient Name: [name]       Medical Record Number: [id number]  Date of Birth: 09/14/1927 Sex: Male         Room/Bed: 4M09C/4M09C-01 Payor Info: Payor: MEDICARE / Plan: MEDICARE PART A AND B / Product Type: *No Product type* /    Admitting Diagnosis: FEM ORIF   Admit Date/Time:  06/07/2014  4:12 PM Admission Comments: No comment available   Primary Diagnosis:  Fracture of femoral neck, left Principal Problem: Fracture of femoral neck, left    Patient Active Problem List     Diagnosis  Date Noted   .  Fracture of femoral neck, left  06/07/2014   .  Hypothyroidism  06/04/2014   .  Closed left hip fracture  06/03/2014   .  COPD (chronic obstructive pulmonary disease)  06/03/2014   .  Hyperlipidemia  06/03/2014   .  Hip fracture  06/03/2014   .  Chronic respiratory failure with hypoxia  10/27/2013   .  COPD with emphysema  07/03/2007     Expected Discharge Date: Expected Discharge Date: 06/12/14  Team Members Present: Physician leading conference: Dr. Alysia Penna Social Worker Present: Lennart Pall, LCSW Nurse Present: Elliot Cousin, RN PT Present: [name], PT OT Present: Salome Spotted, OT;Patricia Lissa Hoard, OT Other (Discipline and Name): Danne Baxter, RN [facility]) PPS Coordinator present : Daiva Nakayama, RN, CRRN        Current Status/Progress  Goal  Weekly Team Focus   Medical     Pain control is good, chronic O2 only at night at home.   Home discharge medical stability, pain control   Discharge planning   Bowel/Bladder     Continent of bowel and bladder; LBM 12/20; on miralax bid  Continent of bowel and bladder  Offer bathroom PRN   Swallow/Nutrition/  Hydration               ADL's     mod I overall  mod I overall  d/c planning   Mobility     S/mod I overall  S/mod I overall  finalize d/c and goals met   Communication               Safety/Cognition/ Behavioral Observations              Pain     Denies  </=3  Assess pain qshift; offer pain medication prior to therapy     Skin     Incision to left hip  No new skin breakdown  Assess incision qshift    Rehab Goals Patient on target to meet rehab goals: Yes *See Care Plan and progress notes for long and short-term goals.    Barriers to Discharge:  None except need some additional  patient and caregiver training     Possible Resolutions to Barriers:   Discharge home tomorrow     Discharge Planning/Teaching Needs:   home with wife who can provide 24/7 assistance        Team Discussion:    Reaching mod i goals and ready for d/c tomorrow   Revisions to Treatment Plan:    None  Continued Need for Acute Rehabilitation Level of Care: The patient requires daily medical management by a physician with specialized training in physical medicine and rehabilitation for the following conditions: Daily direction of a multidisciplinary physical rehabilitation program to ensure safe treatment while eliciting the highest outcome that is of practical value to the patient.: Yes Daily medical management of patient stability for increased activity during participation in an intensive rehabilitation regime.: Yes Daily analysis of laboratory values and/or radiology reports with any subsequent need for medication adjustment of medical intervention for : Post surgical problems;Other;Pulmonary problems  Jullia Mulligan 06/12/2014, 10:03 AM

## 2014-06-12 NOTE — Progress Notes (Signed)
80 y.o. male with history of COPD, HTN, who slipped and fell on to his left hip while dragging his trash can on 06/03/14. He had difficulty weight bearing on left leg and was admitted for workup. X rays done revealing subcapital left femoral neck fracture. He was evaluated by Dr. Alvan Dame and underwent closed reduction with pinning left femur on 06/04/14. Post op is PWB LLE Subjective/Complaints: No pain c/o No new issues overnite, No breathing problems , no bowel or bladder issues  Review of Systems - Negative except as above  Objective: Vital Signs: Blood pressure 142/60, pulse 80, temperature 98.4 F (36.9 C), temperature source Oral, resp. rate 18, height '5\' 10"'  (1.778 m), weight 89 kg (196 lb 3.4 oz), SpO2 100 %. No results found. Results for orders placed or performed during the hospital encounter of 06/07/14 (from the past 72 hour(s))  Comprehensive metabolic panel     Status: Abnormal   Collection Time: 06/10/14  4:47 AM  Result Value Ref Range   Sodium 139 137 - 147 mEq/L   Potassium 4.6 3.7 - 5.3 mEq/L   Chloride 99 96 - 112 mEq/L   CO2 31 19 - 32 mEq/L   Glucose, Bld 107 (H) 70 - 99 mg/dL   BUN 21 6 - 23 mg/dL   Creatinine, Ser 1.06 0.50 - 1.35 mg/dL   Calcium 8.9 8.4 - 10.5 mg/dL   Total Protein 6.3 6.0 - 8.3 g/dL   Albumin 2.9 (L) 3.5 - 5.2 g/dL   AST 41 (H) 0 - 37 U/L   ALT 38 0 - 53 U/L   Alkaline Phosphatase 80 39 - 117 U/L   Total Bilirubin 0.5 0.3 - 1.2 mg/dL   GFR calc non Af Amer 62 (L) >90 mL/min   GFR calc Af Amer 72 (L) >90 mL/min    Comment: (NOTE) The eGFR has been calculated using the CKD EPI equation. This calculation has not been validated in all clinical situations. eGFR's persistently <90 mL/min signify possible Chronic Kidney Disease.    Anion gap 9 5 - 15  CBC WITH DIFFERENTIAL     Status: Abnormal   Collection Time: 06/10/14  4:47 AM  Result Value Ref Range   WBC 7.9 4.0 - 10.5 K/uL   RBC 3.97 (L) 4.22 - 5.81 MIL/uL   Hemoglobin 12.0 (L) 13.0  - 17.0 g/dL   HCT 37.4 (L) 39.0 - 52.0 %   MCV 94.2 78.0 - 100.0 fL   MCH 30.2 26.0 - 34.0 pg   MCHC 32.1 30.0 - 36.0 g/dL   RDW 13.2 11.5 - 15.5 %   Platelets 220 150 - 400 K/uL   Neutrophils Relative % 63 43 - 77 %   Neutro Abs 5.0 1.7 - 7.7 K/uL   Lymphocytes Relative 23 12 - 46 %   Lymphs Abs 1.8 0.7 - 4.0 K/uL   Monocytes Relative 10 3 - 12 %   Monocytes Absolute 0.8 0.1 - 1.0 K/uL   Eosinophils Relative 3 0 - 5 %   Eosinophils Absolute 0.2 0.0 - 0.7 K/uL   Basophils Relative 1 0 - 1 %   Basophils Absolute 0.0 0.0 - 0.1 K/uL     HEENT: normal Cardio: RRR and no murmur Resp: CTA B/L and unlabored GI: BS positive and NT, ND Extremity:  No Edema Skin:   Wound C/D/I Neuro: Alert/Oriented Musc/Skel:  Left thigh without erythema to tenderness around surgical site Gen NAD   Assessment/Plan: 1. Functional deficits secondary to Left  femoral neck fracture s/p closed reduction which require 3+ hours per day of interdisciplinary therapy in a comprehensive inpatient rehab setting. Physiatrist is providing close team supervision and 24 hour management of active medical problems listed below. Physiatrist and rehab team continue to assess barriers to discharge/monitor patient progress toward functional and medical goals. FIM: FIM - Bathing Bathing Steps Patient Completed: Chest, Right Arm, Left Arm, Abdomen, Left upper leg, Right upper leg, Buttocks, Front perineal area, Right lower leg (including foot), Left lower leg (including foot) Bathing: 6: Assistive device (Comment) (LH sponge)  FIM - Upper Body Dressing/Undressing Upper body dressing/undressing steps patient completed: Thread/unthread right sleeve of pullover shirt/dresss, Thread/unthread left sleeve of pullover shirt/dress, Put head through opening of pull over shirt/dress, Pull shirt over trunk Upper body dressing/undressing: 6: Assistive device (Comment) FIM - Lower Body Dressing/Undressing Lower body dressing/undressing  steps patient completed: Thread/unthread right pants leg, Thread/unthread left pants leg, Pull pants up/down, Don/Doff left shoe, Don/Doff right shoe, Don/Doff right sock, Don/Doff left sock Lower body dressing/undressing: 6: Assistive device (Comment)  FIM - Toileting Toileting steps completed by patient: Performs perineal hygiene, Adjust clothing prior to toileting, Adjust clothing after toileting Toileting Assistive Devices: Grab bar or rail for support Toileting: 6: Assistive device: No helper  FIM - Radio producer Devices: Environmental consultant, Grab bars, Elevated toilet seat Toilet Transfers: 6-More than reasonable amt of time  FIM - Control and instrumentation engineer Devices: Copy: 6: Assistive device: no helper  FIM - Locomotion: Wheelchair Distance: 100 Locomotion: Wheelchair: 0: Activity did not occur (gait primary means) FIM - Locomotion: Ambulation Locomotion: Ambulation Assistive Devices: Administrator Ambulation/Gait Assistance: 6: Modified independent (Device/Increase time) Locomotion: Ambulation: 6: Travels 150 ft or more independently/takes more than reasonable amount of time  Comprehension Comprehension Mode: Auditory Comprehension: 7-Follows complex conversation/direction: With no assist  Expression Expression Mode: Verbal Expression: 7-Expresses complex ideas: With no assist  Social Interaction Social Interaction: 7-Interacts appropriately with others - No medications needed.  Problem Solving Problem Solving: 7-Solves complex problems: Recognizes & self-corrects  Memory Memory: 7-Complete Independence: No helper  Medical Problem List and Plan: 1. Functional deficits secondary to Left femoral neck fracture s/p closed reduction/pinnning 2. DVT Prophylaxis/Anticoagulation: Pharmaceutical: Lovenox 3. Pain Management: hydrocodone and robaxin prn 4. Mood: pt in good spirits. Team to provide egosupport 5.  Neuropsych: This patient is capabable of making decisions on his own behalf. 6. Skin/Wound Care: local care to incision. Decubitus precautions. Encourage appropriate intake 7. Fluids/Electrolytes/Nutrition: encourage po, follow up labwork on admit 8. COPD: spiriva, oxygen as needed 9. ABLA: counts fairly robust- vitamin/dietary sources 10. HTN: normotensive.  11. Urinary retention: pvr's low. I/o cath prn. Seems to be voiding fairly well over the last 48 hours  LOS (Days) 5 A FACE TO FACE EVALUATION WAS PERFORMED  KIRSTEINS,ANDREW E 06/12/2014, 7:10 AM

## 2014-07-05 DIAGNOSIS — I10 Essential (primary) hypertension: Secondary | ICD-10-CM | POA: Diagnosis not present

## 2014-07-05 DIAGNOSIS — J441 Chronic obstructive pulmonary disease with (acute) exacerbation: Secondary | ICD-10-CM | POA: Diagnosis not present

## 2014-07-05 DIAGNOSIS — S72002D Fracture of unspecified part of neck of left femur, subsequent encounter for closed fracture with routine healing: Secondary | ICD-10-CM | POA: Diagnosis not present

## 2014-07-05 DIAGNOSIS — W010XXD Fall on same level from slipping, tripping and stumbling without subsequent striking against object, subsequent encounter: Secondary | ICD-10-CM | POA: Diagnosis not present

## 2014-09-17 ENCOUNTER — Telehealth: Payer: Self-pay

## 2014-09-17 NOTE — Telephone Encounter (Signed)
09/17/14 Received 2 Disc from [facility] filed on shelf. [place]

## 2014-11-27 ENCOUNTER — Other Ambulatory Visit: Payer: Self-pay | Admitting: Pulmonary Disease

## 2014-12-30 ENCOUNTER — Encounter: Payer: Self-pay | Admitting: Pulmonary Disease

## 2014-12-30 ENCOUNTER — Ambulatory Visit (INDEPENDENT_AMBULATORY_CARE_PROVIDER_SITE_OTHER): Payer: Medicare Other | Admitting: Pulmonary Disease

## 2014-12-30 VITALS — BP 152/90 | HR 54 | Ht 70.0 in | Wt 184.8 lb

## 2014-12-30 DIAGNOSIS — G4734 Idiopathic sleep related nonobstructive alveolar hypoventilation: Secondary | ICD-10-CM | POA: Diagnosis not present

## 2014-12-30 DIAGNOSIS — J432 Centrilobular emphysema: Secondary | ICD-10-CM | POA: Diagnosis not present

## 2014-12-30 DIAGNOSIS — J9611 Chronic respiratory failure with hypoxia: Secondary | ICD-10-CM

## 2014-12-30 NOTE — Patient Instructions (Signed)
Follow up in 6 months 

## 2014-12-30 NOTE — Progress Notes (Signed)
Chief Complaint  Patient presents with  . Follow-up    Pt reports still using O2 at night - 2liters. Pt notes some increased SOB with exertion but has not had to use O2 with exertion. Pt reports good tolerance of Spiriva, Symbicort and Ventolin.     History of Present Illness: [name] is a 81 y.o. 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  His breathing has been about the same.  He gets occasional cough and chest congestion.  He tries to keep up with his activity.  He has to pace himself.  He uses oxygen at night.  He checks his oxygen at home, and this doesn't go below 90%.  He gets his medications through the [place].  TESTS: PFT 07/14/06 >> FEV1 1.41(51%), FEV1% 60, TLC 7.3(123%), DLCO 75%, no BD  PFT 07/29/10 >> FEV1 1.30(50%), FEV1% 50, TLC 6.39(100%), DLCO 68%, no BD Spirometry 11/17/10 >> FEV1 1.15(36%), FEV1% 56 ONO with RA 11/30/12 >> Test time 9 hrs 30 min.  Mean SpO2 92.8%, low SpO2 86%.  Spent 3 min with SpO2 < 88% Spirometry 10/26/13 >> FEV1 0.92 (30%), FEV1% 39 ONO with 2 liters 01/16/14 >> test time 8 hrs 36 min. Basal SpO2 93%, low SpO2 85%. Spent 4 min with SpO2 < 88%.  PMHx >> HLD, Hypothyroidism  PSHx, Medications, Allergies, Fhx, Shx reviewed.  Physical Exam: BP 152/90 mmHg  Pulse 54  Ht 5\' 10"  (1.778 m)  Wt 184 lb 12.8 oz (83.825 kg)  BMI 26.52 kg/m2  SpO2 100%  General - No distress ENT - No sinus tenderness, no oral exudate, no LAN Cardiac - s1s2 regular, no murmur Chest - decreased breath sounds, no wheeze/rales Back - No focal tenderness Abd - Soft, non-tender Ext - No edema Neuro - Normal strength Skin - No rashes Psych - normal mood, and behavior   Impression:  COPD with emphysema.  Prednisone dependent since May 2015. Plan: - stable on spiriva, symbicort, and prn ventolin  - he is to continue 2.5 mg prednisone daily >> advised he could try changing prednisone to qod after  his Summer trips if his breathing is doing okay  Chronic hypoxic respiratory failure. Plan: - he is to continue 2 liters oxygen at night - explained that he can contact his DME to try setting up temporary oxygen supplies for when he travels >> he can call back if he needs order for this   [name], MD [facility] Pager:  [phone]

## 2015-04-01 ENCOUNTER — Telehealth: Payer: Self-pay | Admitting: Pulmonary Disease

## 2015-04-01 NOTE — Telephone Encounter (Signed)
Spoke with pt, states that he needs to make an appt for sx clearance for hip replacement.  Pt scheduled with [facility] as [facility] does not have openings before his sx.  Nothing further needed.

## 2015-04-07 ENCOUNTER — Ambulatory Visit (INDEPENDENT_AMBULATORY_CARE_PROVIDER_SITE_OTHER): Payer: Medicare Other | Admitting: Internal Medicine

## 2015-04-07 ENCOUNTER — Ambulatory Visit (INDEPENDENT_AMBULATORY_CARE_PROVIDER_SITE_OTHER)
Admission: RE | Admit: 2015-04-07 | Discharge: 2015-04-07 | Disposition: A | Discharge status: Home / Self Care | Payer: Medicare Other | Source: Ambulatory Visit | Attending: Internal Medicine | Admitting: Internal Medicine

## 2015-04-07 ENCOUNTER — Ambulatory Visit: Payer: Medicare Other | Admitting: Adult Health

## 2015-04-07 ENCOUNTER — Encounter: Payer: Self-pay | Admitting: Internal Medicine

## 2015-04-07 VITALS — BP 132/60 | HR 95 | Ht 70.0 in | Wt 189.8 lb

## 2015-04-07 DIAGNOSIS — Z23 Encounter for immunization: Secondary | ICD-10-CM

## 2015-04-07 DIAGNOSIS — J449 Chronic obstructive pulmonary disease, unspecified: Secondary | ICD-10-CM

## 2015-04-07 DIAGNOSIS — J9611 Chronic respiratory failure with hypoxia: Secondary | ICD-10-CM

## 2015-04-07 NOTE — Patient Instructions (Signed)
Please remember to go to the  x-ray department downstairs for your tests - we will call you with the results when they are available and officially clear you for surgery then  The most important aspect of you post op care is to mobilize asap and minimize the amt of pain meds you take to control but not eliminate the pain   Pulmonary follow up can be as needed as inpatient and per Dr Halford Chessman as outpatient

## 2015-04-07 NOTE — Progress Notes (Signed)
Chief Complaint  Patient presents with  . Follow-up    Pt reports still using O2 at night - 2liters. Pt notes some increased SOB with exertion but has not had to use O2 with exertion. Pt reports good tolerance of Spiriva, Symbicort and Ventolin.     History of Present Illness: [name] is a 81 y.o. 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    TESTS: PFT 07/14/06 >> FEV1 1.41(51%), FEV1% 60, TLC 7.3(123%), DLCO 75%, no BD  PFT 07/29/10 >> FEV1 1.30(50%), FEV1% 50, TLC 6.39(100%), DLCO 68%, no BD Spirometry 11/17/10 >> FEV1 1.15(36%), FEV1% 56 ONO with RA 11/30/12 >> Test time 9 hrs 30 min.  Mean SpO2 92.8%, low SpO2 86%.  Spent 3 min with SpO2 < 88% Spirometry 10/26/13 >> FEV1 0.92 (30%), FEV1% 39 ONO with 2 liters 01/16/14 >> test time 8 hrs 36 min. Basal SpO2 93%, low SpO2 85%. Spent 4 min with SpO2 < 88%.    04/07/2015  consultation/Nyanna Heideman re: GOLD III/IV copd steroid dep since May 2015 @ 2.5mg  qod quit smoking 2012  Chief Complaint  Patient presents with  . Advice Only    Pt of VS. Here for surgery clearance for hip replacement. Pt states breathing same since last visit. No c/o cough, congestion, wheezing, chest pain, or fever    No am cough/ congestion / walking is about as well as ever Walks wm with cane slow pace MMRC2 = can't walk a nl pace on a flat grade s sob/ can still walk aisles at grocery store slowly but limited by hip pain> sob from doing more, even at slow pace  No obvious day to day or daytime variability or assoc excess/ purulent sputum cp or chest tightness, subjective wheeze or overt sinus or hb symptoms. No unusual exp hx or h/o childhood pna/ asthma or knowledge of premature birth.  Sleeping ok without nocturnal  or early am exacerbation  of respiratory  c/o's or need for noct saba. Also denies any obvious fluctuation of symptoms with weather or environmental changes or other aggravating or  alleviating factors except as outlined above   Current Medications, Allergies, Complete Past Medical History, Past Surgical History, Family History, and Social History were reviewed in [employer] record.  ROS  The following are not active complaints unless bolded sore throat, dysphagia, dental problems, itching, sneezing,  nasal congestion or excess/ purulent secretions, ear ache,   fever, chills, sweats, unintended wt loss, classically pleuritic or exertional cp, hemoptysis,  orthopnea pnd or leg swelling, presyncope, palpitations, abdominal pain, anorexia, nausea, vomiting, diarrhea  or change in bowel or bladder habits, change in stools or urine, dysuria,hematuria,  rash, arthralgias, visual complaints, headache, numbness, weakness or ataxia or problems with walking or coordination,  change in mood/affect or memory.              PMHx >> HLD, Hypothyroidism  PSHx, Medications, Allergies, Fhx, Shx reviewed.  Physical Exam:   amb wm nad / on RA / walks with crutch  Wt Readings from Last 3 Encounters:  04/07/15 189 lb 12.8 oz (86.093 kg)  12/30/14 184 lb 12.8 oz (83.825 kg)  06/07/14 196 lb 3.4 oz (89 kg)    Vital signs reviewed     General - No distress ENT - No sinus tenderness, no oral exudate, no LAN Cardiac - s1s2 regular, no murmur Chest - decreased breath sounds, no wheeze/rales Back - No focal tenderness Abd -  Soft, non-tender/ pos [place] late insp Ext - No edema Neuro - Normal strength Skin - No rashes Psych - normal mood, and behavior   CXR PA and Lateral:   04/07/2015 :    I personally reviewed images and agree with radiology impression as follows:   No active cardiopulmonary disease.

## 2015-04-08 NOTE — Assessment & Plan Note (Signed)
Chronic noct 02 2lpm  - HC03 31 on 06/10/14 c/w mild hypercarbic component  May need to be bridged with bipap but I don't foresee him being vent dep post op unless there are unforeseen complications

## 2015-04-08 NOTE — Assessment & Plan Note (Addendum)
Spirometry 10/26/13   FEV0.92 (30%)  ratio 39  - spirometry 04/07/2015   FEV1  0.91 (29%) ratio 42  He has baseline severe airflow obst despite laba/lama/ics and qod prednisone > surprisingly well compensated and probably not very symptomatic at this point as very sedentary due to hip pain which he says he can't live with so reasonable to go ahead with hip surgery as planned as acceptable risk.   periop he needs duonebs qid and stress steroids   Post op he needs mobilization and min narcs and pulmonary f/u prn   Discussed in detail all the  indications, usual  risks and alternatives  relative to the benefits with patient who agrees to proceed with hip surgery as planned (L THR)   Total  time devoted to discussion = 26m review case with pt/ discussion/ counseling/ giving and going over instructions (see avs)

## 2015-04-08 NOTE — Progress Notes (Signed)
Quick Note:  Spoke with pt and notified of results per Dr. Wert. Pt verbalized understanding and denied any questions.  ______ 

## 2015-04-15 NOTE — Patient Instructions (Addendum)
[place]  04/15/2015   Your procedure is scheduled on:   04-28-2015 Monday  Enter through [facility]  Entrance and follow signs to [facility]. Arrive at    1030    AM .  (Limit 1 person with you).  Call this number if you have problems the morning of surgery: [phone]  Or Presurgical Testing [phone].   For Living Will and/or Health Care Power Attorney Forms: please provide copy for your medical record,may bring AM of surgery(Forms should be already notarized -we do not provide this service).(04-16-15 Yes, will provide AM of surgery).   For Cpap use: Bring mask and tubing only.   Do not eat food/ or drink: After Midnight.  Exception: may have clear liquids:up to 6 Hours before arrival. Nothing after: 0730 AM  Clear liquids include soda, tea, black coffee, apple or grape juice, broth.  Take these medicines the morning of surgery with A SIP OF WATER-   (DO NOT TAKE ANY DIABETIC MEDS AM OF SURGERY) : Levothyroxine. Prednisone-if applies. Use/bring Inhalers.   Do not wear jewelry, make-up or nail polish.  Do not wear deodorant, lotions, powders, or perfumes.   Do not shave legs and under arms- 48 hours(2 days) prior to first CHG shower.(Shaving face and neck okay.)  Do not bring valuables to the hospital.(Hospital is not responsible for lost valuables).  Contacts, dentures or removable bridgework, body piercing, hair pins may not be worn into surgery.  Leave suitcase in the car. After surgery it may be brought to your room.  For patients admitted to the hospital, checkout time is 11:00 AM the day of discharge.(Restricted visitors-Any Persons displaying flu-like symptoms or illness).    Patients discharged the day of surgery will not be allowed to drive home. Must have responsible person with you x 24 hours once discharged.  Name and phone number of your driver: [place] [phone] cell     Please read over the following fact sheets that  you were given:  CHG(Chlorhexidine Gluconate 4% Surgical Soap) use, MRSA Information, Blood Transfusion fact sheet, Incentive Spirometry Instruction.  Remember : Type/Screen "Blue armbands" - may not be removed once applied(would result in being retested AM of surgery, if removed).         [facility] - Preparing for Surgery Before surgery, you can play an important role.  Because skin is not sterile, your skin needs to be as free of germs as possible.  You can reduce the number of germs on your skin by washing with CHG (chlorahexidine gluconate) soap before surgery.  CHG is an antiseptic cleaner which kills germs and bonds with the skin to continue killing germs even after washing. Please DO NOT use if you have an allergy to CHG or antibacterial soaps.  If your skin becomes reddened/irritated stop using the CHG and inform your nurse when you arrive at Short Stay. Do not shave (including legs and underarms) for at least 48 hours prior to the first CHG shower.  You may shave your face/neck. Please follow these instructions carefully:  1.  Shower with CHG Soap the night before surgery and the  morning of Surgery.  2.  If you choose to wash your hair, wash your hair first as usual with your  normal  shampoo.  3.  After you shampoo, rinse your hair and body thoroughly to remove the  shampoo.  4.  Use CHG as you would any other liquid soap.  You can apply chg directly  to the skin and wash                       Gently with a scrungie or clean washcloth.  5.  Apply the CHG Soap to your body ONLY FROM THE NECK DOWN.   Do not use on face/ open                           Wound or open sores. Avoid contact with eyes, ears mouth and genitals (private parts).                       Wash face,  Genitals (private parts) with your normal soap.             6.  Wash thoroughly, paying special attention to the area where your surgery  will be performed.  7.  Thoroughly rinse your body with  warm water from the neck down.  8.  DO NOT shower/wash with your normal soap after using and rinsing off  the CHG Soap.                9.  Pat yourself dry with a clean towel.            10.  Wear clean pajamas.            11.  Place clean sheets on your bed the night of your first shower and do not  sleep with pets. Day of Surgery : Do not apply any lotions/deodorants the morning of surgery.  Please wear clean clothes to the hospital/surgery center.  FAILURE TO FOLLOW THESE INSTRUCTIONS MAY RESULT IN THE CANCELLATION OF YOUR SURGERY PATIENT SIGNATURE_________________________________  NURSE SIGNATURE__________________________________  ________________________________________________________________________   Adam Phenix  An incentive spirometer is a tool that can help keep your lungs clear and active. This tool measures how well you are filling your lungs with each breath. Taking long deep breaths may help reverse or decrease the chance of developing breathing (pulmonary) problems (especially infection) following:  A long period of time when you are unable to move or be active. BEFORE THE PROCEDURE   If the spirometer includes an indicator to show your best effort, your nurse or respiratory therapist will set it to a desired goal.  If possible, sit up straight or lean slightly forward. Try not to slouch.  Hold the incentive spirometer in an upright position. INSTRUCTIONS FOR USE   Sit on the edge of your bed if possible, or sit up as far as you can in bed or on a chair.  Hold the incentive spirometer in an upright position.  Breathe out normally.  Place the mouthpiece in your mouth and seal your lips tightly around it.  Breathe in slowly and as deeply as possible, raising the piston or the ball toward the top of the column.  Hold your breath for 3-5 seconds or for as long as possible. Allow the piston or ball to fall to the bottom of the column.  Remove the  mouthpiece from your mouth and breathe out normally.  Rest for a few seconds and repeat Steps 1 through 7 at least 10 times every 1-2 hours when you are awake. Take your time and take a few normal breaths between deep breaths.  The spirometer may include an indicator to  show your best effort. Use the indicator as a goal to work toward during each repetition.  After each set of 10 deep breaths, practice coughing to be sure your lungs are clear. If you have an incision (the cut made at the time of surgery), support your incision when coughing by placing a pillow or rolled up towels firmly against it. Once you are able to get out of bed, walk around indoors and cough well. You may stop using the incentive spirometer when instructed by your caregiver.  RISKS AND COMPLICATIONS  Take your time so you do not get dizzy or light-headed.  If you are in pain, you may need to take or ask for pain medication before doing incentive spirometry. It is harder to take a deep breath if you are having pain. AFTER USE  Rest and breathe slowly and easily.  It can be helpful to keep track of a log of your progress. Your caregiver can provide you with a simple table to help with this. If you are using the spirometer at home, follow these instructions: [place] IF:   You are having difficultly using the spirometer.  You have trouble using the spirometer as often as instructed.  Your pain medication is not giving enough relief while using the spirometer.  You develop fever of 100.5 F (38.1 C) or higher. SEEK IMMEDIATE MEDICAL CARE IF:   You cough up bloody sputum that had not been present before.  You develop fever of 102 F (38.9 C) or greater.  You develop worsening pain at or near the incision site. MAKE SURE YOU:   Understand these instructions.  Will watch your condition.  Will get help right away if you are not doing well or get worse. Document Released: 10/18/2006 Document Revised:  08/30/2011 Document Reviewed: 12/19/2006 ExitCare Patient Information 2014 Exit[employer], [place].   ________________________________________________________________________  WHAT IS A BLOOD TRANSFUSION? Blood Transfusion Information  A transfusion is the replacement of blood or some of its parts. Blood is made up of multiple cells which provide different functions.  Red blood cells carry oxygen and are used for blood loss replacement.  White blood cells fight against infection.  Platelets control bleeding.  Plasma helps clot blood.  Other blood products are available for specialized needs, such as hemophilia or other clotting disorders. BEFORE THE TRANSFUSION  Who gives blood for transfusions?   Healthy volunteers who are fully evaluated to make sure their blood is safe. This is blood bank blood. Transfusion therapy is the safest it has ever been in the practice of medicine. Before blood is taken from a donor, a complete history is taken to make sure that person has no history of diseases nor engages in risky social behavior (examples are intravenous drug use or sexual activity with multiple partners). The donor's travel history is screened to minimize risk of transmitting infections, such as malaria. The donated blood is tested for signs of infectious diseases, such as HIV and hepatitis. The blood is then tested to be sure it is compatible with you in order to minimize the chance of a transfusion reaction. If you or a relative donates blood, this is often done in anticipation of surgery and is not appropriate for emergency situations. It takes many days to process the donated blood. RISKS AND COMPLICATIONS Although transfusion therapy is very safe and saves many lives, the main dangers of transfusion include:   Getting an infectious disease.  Developing a transfusion reaction. This is an allergic reaction to  something in the blood you were given. Every precaution is taken to prevent  this. The decision to have a blood transfusion has been considered carefully by your caregiver before blood is given. Blood is not given unless the benefits outweigh the risks. AFTER THE TRANSFUSION  Right after receiving a blood transfusion, you will usually feel much better and more energetic. This is especially true if your red blood cells have gotten low (anemic). The transfusion raises the level of the red blood cells which carry oxygen, and this usually causes an energy increase.  The nurse administering the transfusion will monitor you carefully for complications. HOME CARE INSTRUCTIONS  No special instructions are needed after a transfusion. You may find your energy is better. Speak with your caregiver about any limitations on activity for underlying diseases you may have. SEEK MEDICAL CARE IF:   Your condition is not improving after your transfusion.  You develop redness or irritation at the intravenous (IV) site. SEEK IMMEDIATE MEDICAL CARE IF:  Any of the following symptoms occur over the next 12 hours:  Shaking chills.  You have a temperature by mouth above 102 F (38.9 C), not controlled by medicine.  Chest, back, or muscle pain.  People around you feel you are not acting correctly or are confused.  Shortness of breath or difficulty breathing.  Dizziness and fainting.  You get a rash or develop hives.  You have a decrease in urine output.  Your urine turns a dark color or changes to pink, red, or brown. Any of the following symptoms occur over the next 10 days:  You have a temperature by mouth above 102 F (38.9 C), not controlled by medicine.  Shortness of breath.  Weakness after normal activity.  The white part of the eye turns yellow (jaundice).  You have a decrease in the amount of urine or are urinating less often.  Your urine turns a dark color or changes to pink, red, or brown. Document Released: 06/04/2000 Document Revised: 08/30/2011 Document  Reviewed: 01/22/2008 [facility] Patient Information 2014 [place], [place].  _______________________________________________________________________

## 2015-04-16 ENCOUNTER — Encounter (HOSPITAL_COMMUNITY): Payer: Self-pay

## 2015-04-16 ENCOUNTER — Encounter (HOSPITAL_COMMUNITY)
Admission: RE | Admit: 2015-04-16 | Discharge: 2015-04-16 | Disposition: A | Discharge status: Home / Self Care | Payer: Medicare Other | Source: Ambulatory Visit | Attending: Orthopedic Surgery | Admitting: Orthopedic Surgery

## 2015-04-16 DIAGNOSIS — Z01818 Encounter for other preprocedural examination: Secondary | ICD-10-CM | POA: Insufficient documentation

## 2015-04-16 HISTORY — DX: Personal history of urinary calculi: Z87.442

## 2015-04-16 HISTORY — DX: Dependence on supplemental oxygen: Z99.81

## 2015-04-16 HISTORY — DX: Malignant (primary) neoplasm, unspecified: C80.1

## 2015-04-16 HISTORY — DX: Unspecified osteoarthritis, unspecified site: M19.90

## 2015-04-16 LAB — URINALYSIS, ROUTINE W REFLEX MICROSCOPIC
Bilirubin Urine: NEGATIVE
GLUCOSE, UA: NEGATIVE mg/dL
Hgb urine dipstick: NEGATIVE
KETONES UR: NEGATIVE mg/dL
LEUKOCYTES UA: NEGATIVE
NITRITE: NEGATIVE
PH: 7 (ref 5.0–8.0)
Protein, ur: NEGATIVE mg/dL
Specific Gravity, Urine: 1.013 (ref 1.005–1.030)
Urobilinogen, UA: 1 mg/dL (ref 0.0–1.0)

## 2015-04-16 LAB — SURGICAL PCR SCREEN
MRSA, PCR: NEGATIVE
STAPHYLOCOCCUS AUREUS: NEGATIVE

## 2015-04-16 LAB — TYPE AND SCREEN
ABO/RH(D): A POS
Antibody Screen: NEGATIVE

## 2015-04-16 LAB — CBC
HEMATOCRIT: 40.5 % (ref 39.0–52.0)
HEMOGLOBIN: 13.5 g/dL (ref 13.0–17.0)
MCH: 31 pg (ref 26.0–34.0)
MCHC: 33.3 g/dL (ref 30.0–36.0)
MCV: 93.1 fL (ref 78.0–100.0)
Platelets: 295 10*3/uL (ref 150–400)
RBC: 4.35 MIL/uL (ref 4.22–5.81)
RDW: 13.2 % (ref 11.5–15.5)
WBC: 7.6 10*3/uL (ref 4.0–10.5)

## 2015-04-16 LAB — BASIC METABOLIC PANEL
ANION GAP: 7 (ref 5–15)
BUN: 15 mg/dL (ref 6–20)
CALCIUM: 9.2 mg/dL (ref 8.9–10.3)
CO2: 30 mmol/L (ref 22–32)
Chloride: 103 mmol/L (ref 101–111)
Creatinine, Ser: 1.24 mg/dL (ref 0.61–1.24)
GFR calc non Af Amer: 51 mL/min — ABNORMAL LOW (ref >60)
GFR, EST AFRICAN AMERICAN: 59 mL/min — AB (ref >60)
Glucose, Bld: 103 mg/dL — ABNORMAL HIGH (ref 65–99)
POTASSIUM: 4.9 mmol/L (ref 3.5–5.1)
SODIUM: 140 mmol/L (ref 135–145)

## 2015-04-16 LAB — PROTIME-INR
INR: 1.09 (ref 0.00–1.49)
PROTHROMBIN TIME: 14.3 s (ref 11.6–15.2)

## 2015-04-16 LAB — ABO/RH: ABO/RH(D): A POS

## 2015-04-16 LAB — APTT: APTT: 29 s (ref 24–37)

## 2015-04-16 NOTE — Pre-Procedure Instructions (Signed)
04-16-15 Clearance note(Dr. Halford Chessman) with chart. CXR(04-07-15) with chart.

## 2015-04-17 NOTE — H&P (Signed)
TOTAL HIP REVISION ADMISSION H&P  Patient is admitted for conversion of left hip percutaneous screw fixation to anterior total hip arthroplasty.  Subjective:  Chief Complaint:    Left hip pain and failure of previous hip surgery   HPI: [name], 81 y.o. male, has a history of pain and functional disability in the left hip due to failure of previous hip surgery and patient has failed non-surgical conservative treatments for greater than 12 weeks to include NSAID's and/or analgesics, supervised PT with diminished ADL's post treatment, use of assistive devices and activity modification. The indications for the revision total hip arthroplasty are fracture or mechanical failure of one or more component.  Onset of symptoms was gradual starting 1 month ago with gradually worsening course since that time.  Prior procedures on the left hip include percutaneous screw fixation.  Patient currently rates pain in the left hip at 9 out of 10 with activity.  There is worsening of pain with activity and weight bearing, trendelenberg gait, pain that interfers with activities of daily living and pain with passive range of motion. Patient has evidence of failure of the percutaneous screws by imaging studies.  This condition presents safety issues increasing the risk of falls.    There is no current active infection.  Risks, benefits and expectations were discussed with the patient.  Risks including but not limited to the risk of anesthesia, blood clots, nerve damage, blood vessel damage, failure of the prosthesis, infection and up to and including death.  Patient understand the risks, benefits and expectations and wishes to proceed with surgery.   PCP: [name], MD  D/C Plans:      Home with HHPT  Post-op Meds:       No Rx given   Tranexamic Acid:      To be given - IV  Decadron:      Is to be given  FYI:     ASA post-op  Norco post-op (had after previous surgery)    Patient Active Problem List    Diagnosis Date Noted  . Fracture of femoral neck, left ([place]) 06/07/2014  . Hypothyroidism 06/04/2014  . Closed left hip fracture ([place]) 06/03/2014  . COPD GOLD IV 06/03/2014  . Hyperlipidemia 06/03/2014  . Hip fracture ([place]) 06/03/2014  . Chronic respiratory failure with hypoxia ([place]) 10/27/2013  . COPD with emphysema ([place]) 07/03/2007   Past Medical History  Diagnosis Date  . COPD (chronic obstructive pulmonary disease) ([place])   . Hyperlipidemia   . Hypothyroidism   . Pulmonary nodule, right   . Tobacco abuse     Quit 09/17/10  . History of oxygen administration     Oxygen @ 2 l/m nasally bedtime  . History of kidney stones     lithotripsy x1   . Arthritis     osteoarthritis  . Cancer ([place])     skin cancer only.-no melanoma.    Past Surgical History  Procedure Laterality Date  . Tracheostomy      after aspiration event as a 81 y/o  . Electronavigational bronchoscopy  07/2010  . Hip pinning,cannulated Left 06/04/2014    Procedure: LEFT HIP REDUCTION WITH PERCUTANEOUS SCREWS;  Surgeon: Mauri Pole, MD;  Location: [facility];  Service: Orthopedics;  Laterality: Left;  . Cataract extraction, bilateral      No prescriptions prior to admission   Allergies  Allergen Reactions  . Sulfonamide Derivatives     Unknown - as a child    Social History  Substance Use Topics  . Smoking status: Former Smoker -- 1.00 packs/day for 55 years    Types: Cigarettes    Quit date: 04/22/2011  . Smokeless tobacco: Never Used     Comment: occasionally smoked 2-3 times a week--1 cig each time  . Alcohol Use: Yes     Comment: rare  social    No family history on file.    Review of Systems  Constitutional: Negative.   HENT: Negative.   Eyes: Negative.   Respiratory: Positive for shortness of breath (on exertion).   Cardiovascular: Negative.   Gastrointestinal: Negative.   Genitourinary: Negative.   Musculoskeletal: Positive for joint pain.  Skin: Negative.   Neurological: Negative.    Endo/Heme/Allergies: Negative.   Psychiatric/Behavioral: Negative.     Objective:  Physical Exam  Vital signs in last 24 hours: Temp:  [97.7 F (36.5 C)] 97.7 F (36.5 C) (10/26 1420) Pulse Rate:  [85] 85 (10/26 1420) Resp:  [18] 18 (10/26 1420) BP: (137)/(61) 137/61 mmHg (10/26 1420) SpO2:  [99 %] 99 % (10/26 1420) Weight:  [84.369 kg (186 lb)] 84.369 kg (186 lb) (10/26 1420)   Labs:   Estimated body mass index is 26.52 kg/(m^2) as calculated from the following:   Height as of 12/30/14: 5\' 10"  (1.778 m).   Weight as of 12/30/14: 83.825 kg (184 lb 12.8 oz).  Imaging Review:  Plain radiographs demonstrate severe degenerative joint disease of the left hip(s). There is evidence of  failure of the percutaneous screws.The bone quality appears to be good for age and reported activity level.   Assessment/Plan:  End stage arthritis, left hip(s) with failed previous arthroplasty.  The patient history, physical examination, clinical judgement of the provider and imaging studies are consistent with end stage degenerative joint disease of the left hip and failure of the previous hip surgery. Revision total hip arthroplasty is deemed medically necessary. The treatment options including medical management, injection therapy, arthroscopy and arthroplasty were discussed at length. The risks and benefits of total hip arthroplasty were presented and reviewed. The risks due to aseptic loosening, infection, stiffness, dislocation/subluxation,  thromboembolic complications and other imponderables were discussed.  The patient acknowledged the explanation, agreed to proceed with the plan and consent was signed. Patient is being admitted for inpatient treatment for surgery, pain control, PT, OT, prophylactic antibiotics, VTE prophylaxis, progressive ambulation and ADL's and discharge planning. The patient is planning to be discharged home with home health services.     [name]    PA-C  04/17/2015, 9:36 AM

## 2015-04-28 ENCOUNTER — Encounter (HOSPITAL_COMMUNITY)
Admission: RE | Disposition: A | Discharge status: Skilled Nursing Facility | Payer: Self-pay | Source: Ambulatory Visit | Attending: Orthopedic Surgery

## 2015-04-28 ENCOUNTER — Inpatient Hospital Stay (HOSPITAL_COMMUNITY): Payer: Medicare Other | Admitting: Certified Registered Nurse Anesthetist

## 2015-04-28 ENCOUNTER — Encounter (HOSPITAL_COMMUNITY): Payer: Self-pay | Admitting: *Deleted

## 2015-04-28 ENCOUNTER — Inpatient Hospital Stay (HOSPITAL_COMMUNITY): Payer: Medicare Other

## 2015-04-28 ENCOUNTER — Inpatient Hospital Stay (HOSPITAL_COMMUNITY)
Admission: RE | Admit: 2015-04-28 | Discharge: 2015-05-02 | DRG: 908 | Disposition: A | Discharge status: Skilled Nursing Facility | Payer: Medicare Other | Source: Ambulatory Visit | Attending: Orthopedic Surgery | Admitting: Orthopedic Surgery

## 2015-04-28 DIAGNOSIS — E663 Overweight: Secondary | ICD-10-CM | POA: Diagnosis present

## 2015-04-28 DIAGNOSIS — J449 Chronic obstructive pulmonary disease, unspecified: Secondary | ICD-10-CM | POA: Diagnosis present

## 2015-04-28 DIAGNOSIS — Z87891 Personal history of nicotine dependence: Secondary | ICD-10-CM

## 2015-04-28 DIAGNOSIS — M87352 Other secondary osteonecrosis, left femur: Secondary | ICD-10-CM | POA: Diagnosis present

## 2015-04-28 DIAGNOSIS — E039 Hypothyroidism, unspecified: Secondary | ICD-10-CM | POA: Diagnosis present

## 2015-04-28 DIAGNOSIS — M25552 Pain in left hip: Secondary | ICD-10-CM | POA: Diagnosis present

## 2015-04-28 DIAGNOSIS — Z6826 Body mass index (BMI) 26.0-26.9, adult: Secondary | ICD-10-CM | POA: Diagnosis not present

## 2015-04-28 DIAGNOSIS — M810 Age-related osteoporosis without current pathological fracture: Secondary | ICD-10-CM | POA: Diagnosis present

## 2015-04-28 DIAGNOSIS — Z01812 Encounter for preprocedural laboratory examination: Secondary | ICD-10-CM

## 2015-04-28 DIAGNOSIS — M9689 Other intraoperative and postprocedural complications and disorders of the musculoskeletal system: Secondary | ICD-10-CM | POA: Diagnosis present

## 2015-04-28 DIAGNOSIS — Y793 Surgical instruments, materials and orthopedic devices (including sutures) associated with adverse incidents: Secondary | ICD-10-CM | POA: Diagnosis present

## 2015-04-28 DIAGNOSIS — Y838 Other surgical procedures as the cause of abnormal reaction of the patient, or of later complication, without mention of misadventure at the time of the procedure: Secondary | ICD-10-CM | POA: Diagnosis present

## 2015-04-28 DIAGNOSIS — Z96649 Presence of unspecified artificial hip joint: Secondary | ICD-10-CM

## 2015-04-28 HISTORY — PX: CONVERSION TO TOTAL HIP: SHX5784

## 2015-04-28 SURGERY — CONVERSION, PREVIOUS HIP SURGERY, TO TOTAL HIP ARTHROPLASTY
Anesthesia: Spinal | Site: Hip | Laterality: Left

## 2015-04-28 MED ORDER — BUPIVACAINE HCL (PF) 0.5 % IJ SOLN
INTRAMUSCULAR | Status: DC | PRN
  Administered 2015-04-28: 3 mL

## 2015-04-28 MED ORDER — HYDROCODONE-ACETAMINOPHEN 7.5-325 MG PO TABS
1.0000 | ORAL_TABLET | ORAL | Status: DC
  Administered 2015-04-28: 1 via ORAL
  Administered 2015-04-29 (×2): 2 via ORAL
  Administered 2015-04-29: 1 via ORAL
  Administered 2015-04-29 – 2015-04-30 (×4): 2 via ORAL
  Administered 2015-04-30 (×2): 1 via ORAL
  Administered 2015-04-30 – 2015-05-01 (×2): 2 via ORAL
  Administered 2015-05-01 (×2): 1 via ORAL
  Filled 2015-04-28: qty 1
  Filled 2015-04-28: qty 2
  Filled 2015-04-28: qty 1
  Filled 2015-04-28: qty 2
  Filled 2015-04-28: qty 1
  Filled 2015-04-28 (×3): qty 2
  Filled 2015-04-28: qty 1
  Filled 2015-04-28: qty 2
  Filled 2015-04-28: qty 1
  Filled 2015-04-28: qty 2
  Filled 2015-04-28: qty 1
  Filled 2015-04-28 (×3): qty 2

## 2015-04-28 MED ORDER — MEPERIDINE HCL 50 MG/ML IJ SOLN: 6.2500 mg | INTRAMUSCULAR | Status: DC | PRN

## 2015-04-28 MED ORDER — FENTANYL CITRATE (PF) 100 MCG/2ML IJ SOLN
25.0000 ug | INTRAMUSCULAR | Status: DC | PRN
  Administered 2015-04-28 (×2): 25 ug via INTRAVENOUS

## 2015-04-28 MED ORDER — BISACODYL 10 MG RE SUPP: 10.0000 mg | Freq: Every day | RECTAL | Status: DC | PRN

## 2015-04-28 MED ORDER — ALUM & MAG HYDROXIDE-SIMETH 200-200-20 MG/5ML PO SUSP: 30.0000 mL | ORAL | Status: DC | PRN

## 2015-04-28 MED ORDER — POLYETHYLENE GLYCOL 3350 17 G PO PACK
17.0000 g | PACK | Freq: Two times a day (BID) | ORAL | Status: DC
  Administered 2015-04-28 – 2015-05-02 (×8): 17 g via ORAL

## 2015-04-28 MED ORDER — METHOCARBAMOL 1000 MG/10ML IJ SOLN
500.0000 mg | Freq: Four times a day (QID) | INTRAVENOUS | Status: DC | PRN
  Administered 2015-04-28: 500 mg via INTRAVENOUS
  Filled 2015-04-28 (×2): qty 5

## 2015-04-28 MED ORDER — HYDROCORTISONE NA SUCCINATE PF 100 MG IJ SOLR
INTRAMUSCULAR | Status: DC | PRN
  Administered 2015-04-28: 100 mg via INTRAVENOUS

## 2015-04-28 MED ORDER — CEFAZOLIN SODIUM-DEXTROSE 2-3 GM-% IV SOLR
2.0000 g | Freq: Four times a day (QID) | INTRAVENOUS | Status: AC
  Administered 2015-04-28 – 2015-04-29 (×2): 2 g via INTRAVENOUS
  Filled 2015-04-28 (×2): qty 50

## 2015-04-28 MED ORDER — HYDROMORPHONE HCL 1 MG/ML IJ SOLN
0.5000 mg | INTRAMUSCULAR | Status: DC | PRN
  Administered 2015-04-28 – 2015-04-29 (×4): 0.5 mg via INTRAVENOUS
  Filled 2015-04-28 (×4): qty 1

## 2015-04-28 MED ORDER — FENTANYL CITRATE (PF) 100 MCG/2ML IJ SOLN
INTRAMUSCULAR | Status: AC
  Filled 2015-04-28: qty 2

## 2015-04-28 MED ORDER — TIOTROPIUM BROMIDE MONOHYDRATE 18 MCG IN CAPS
18.0000 ug | ORAL_CAPSULE | Freq: Every day | RESPIRATORY_TRACT | Status: DC
  Administered 2015-04-29 – 2015-05-02 (×4): 18 ug via RESPIRATORY_TRACT
  Filled 2015-04-28: qty 5

## 2015-04-28 MED ORDER — PROMETHAZINE HCL 25 MG/ML IJ SOLN: 6.2500 mg | INTRAMUSCULAR | Status: DC | PRN

## 2015-04-28 MED ORDER — FENTANYL CITRATE (PF) 100 MCG/2ML IJ SOLN
INTRAMUSCULAR | Status: AC
  Filled 2015-04-28: qty 4

## 2015-04-28 MED ORDER — LACTATED RINGERS IV SOLN
INTRAVENOUS | Status: DC
  Administered 2015-04-28 (×2): via INTRAVENOUS
  Administered 2015-04-28: 1000 mL via INTRAVENOUS

## 2015-04-28 MED ORDER — PROPOFOL 10 MG/ML IV BOLUS
INTRAVENOUS | Status: AC
  Filled 2015-04-28: qty 20

## 2015-04-28 MED ORDER — METOCLOPRAMIDE HCL 10 MG PO TABS: 5.0000 mg | ORAL_TABLET | Freq: Three times a day (TID) | ORAL | Status: DC | PRN

## 2015-04-28 MED ORDER — PROPOFOL 500 MG/50ML IV EMUL
INTRAVENOUS | Status: DC | PRN
  Administered 2015-04-28: 50 ug/kg/min via INTRAVENOUS

## 2015-04-28 MED ORDER — ALBUTEROL SULFATE (2.5 MG/3ML) 0.083% IN NEBU: 2.5000 mg | INHALATION_SOLUTION | RESPIRATORY_TRACT | Status: DC | PRN

## 2015-04-28 MED ORDER — ONDANSETRON HCL 4 MG PO TABS: 4.0000 mg | ORAL_TABLET | Freq: Four times a day (QID) | ORAL | Status: DC | PRN

## 2015-04-28 MED ORDER — CHLORHEXIDINE GLUCONATE 4 % EX LIQD: 60.0000 mL | Freq: Once | CUTANEOUS | Status: DC

## 2015-04-28 MED ORDER — DIPHENHYDRAMINE HCL 25 MG PO CAPS
25.0000 mg | ORAL_CAPSULE | Freq: Four times a day (QID) | ORAL | Status: DC | PRN
  Administered 2015-04-30 (×2): 25 mg via ORAL
  Filled 2015-04-28 (×2): qty 1

## 2015-04-28 MED ORDER — ASPIRIN EC 325 MG PO TBEC
325.0000 mg | DELAYED_RELEASE_TABLET | Freq: Two times a day (BID) | ORAL | Status: DC
  Administered 2015-04-29 – 2015-05-02 (×7): 325 mg via ORAL
  Filled 2015-04-28 (×10): qty 1

## 2015-04-28 MED ORDER — FENTANYL CITRATE (PF) 100 MCG/2ML IJ SOLN
INTRAMUSCULAR | Status: DC | PRN
  Administered 2015-04-28: 50 ug via INTRAVENOUS

## 2015-04-28 MED ORDER — ONDANSETRON HCL 4 MG/2ML IJ SOLN: 4.0000 mg | Freq: Four times a day (QID) | INTRAMUSCULAR | Status: DC | PRN

## 2015-04-28 MED ORDER — CEFAZOLIN SODIUM-DEXTROSE 2-3 GM-% IV SOLR
2.0000 g | INTRAVENOUS | Status: AC
  Administered 2015-04-28: 2 g via INTRAVENOUS

## 2015-04-28 MED ORDER — MENTHOL 3 MG MT LOZG: 1.0000 | LOZENGE | OROMUCOSAL | Status: DC | PRN

## 2015-04-28 MED ORDER — MIDAZOLAM HCL 2 MG/2ML IJ SOLN: 0.5000 mg | Freq: Once | INTRAMUSCULAR | Status: DC | PRN

## 2015-04-28 MED ORDER — SIMVASTATIN 5 MG PO TABS
5.0000 mg | ORAL_TABLET | Freq: Every day | ORAL | Status: DC
  Administered 2015-04-28 – 2015-05-01 (×4): 5 mg via ORAL
  Filled 2015-04-28 (×5): qty 1

## 2015-04-28 MED ORDER — DEXAMETHASONE SODIUM PHOSPHATE 10 MG/ML IJ SOLN
10.0000 mg | Freq: Once | INTRAMUSCULAR | Status: AC
  Administered 2015-04-28: 10 mg via INTRAVENOUS

## 2015-04-28 MED ORDER — ONDANSETRON HCL 4 MG/2ML IJ SOLN
INTRAMUSCULAR | Status: DC | PRN
  Administered 2015-04-28: 4 mg via INTRAVENOUS

## 2015-04-28 MED ORDER — SODIUM CHLORIDE 0.9 % IV SOLN
100.0000 mL/h | INTRAVENOUS | Status: DC
  Administered 2015-04-28 – 2015-05-01 (×3): 100 mL/h via INTRAVENOUS
  Filled 2015-04-28 (×9): qty 1000

## 2015-04-28 MED ORDER — DOCUSATE SODIUM 100 MG PO CAPS
100.0000 mg | ORAL_CAPSULE | Freq: Two times a day (BID) | ORAL | Status: DC
  Administered 2015-04-28 – 2015-05-02 (×8): 100 mg via ORAL

## 2015-04-28 MED ORDER — BUDESONIDE-FORMOTEROL FUMARATE 160-4.5 MCG/ACT IN AERO
2.0000 | INHALATION_SPRAY | Freq: Two times a day (BID) | RESPIRATORY_TRACT | Status: DC
  Administered 2015-04-28 – 2015-05-02 (×8): 2 via RESPIRATORY_TRACT
  Filled 2015-04-28: qty 6

## 2015-04-28 MED ORDER — PHENOL 1.4 % MT LIQD: 1.0000 | OROMUCOSAL | Status: DC | PRN

## 2015-04-28 MED ORDER — CEFAZOLIN SODIUM-DEXTROSE 2-3 GM-% IV SOLR
INTRAVENOUS | Status: AC
Start: 2015-04-28 — End: 2015-04-28
  Filled 2015-04-28: qty 50

## 2015-04-28 MED ORDER — FERROUS SULFATE 325 (65 FE) MG PO TABS
325.0000 mg | ORAL_TABLET | Freq: Three times a day (TID) | ORAL | Status: DC
  Administered 2015-04-29 – 2015-05-02 (×8): 325 mg via ORAL
  Filled 2015-04-28 (×13): qty 1

## 2015-04-28 MED ORDER — SODIUM CHLORIDE 0.9 % IR SOLN
Status: DC | PRN
  Administered 2015-04-28: 1000 mL

## 2015-04-28 MED ORDER — HYDROCORTISONE NA SUCCINATE PF 100 MG IJ SOLR
INTRAMUSCULAR | Status: AC
  Filled 2015-04-28: qty 2

## 2015-04-28 MED ORDER — METOCLOPRAMIDE HCL 5 MG/ML IJ SOLN: 5.0000 mg | Freq: Three times a day (TID) | INTRAMUSCULAR | Status: DC | PRN

## 2015-04-28 MED ORDER — LEVOTHYROXINE SODIUM 50 MCG PO TABS
50.0000 ug | ORAL_TABLET | Freq: Every day | ORAL | Status: DC
  Administered 2015-04-29 – 2015-05-02 (×4): 50 ug via ORAL
  Filled 2015-04-28 (×5): qty 1

## 2015-04-28 MED ORDER — TRANEXAMIC ACID 1000 MG/10ML IV SOLN
1000.0000 mg | Freq: Once | INTRAVENOUS | Status: AC
  Administered 2015-04-28: 1000 mg via INTRAVENOUS
  Filled 2015-04-28: qty 10

## 2015-04-28 MED ORDER — EPHEDRINE SULFATE 50 MG/ML IJ SOLN
INTRAMUSCULAR | Status: DC | PRN
  Administered 2015-04-28: 10 mg via INTRAVENOUS
  Administered 2015-04-28 (×3): 5 mg via INTRAVENOUS

## 2015-04-28 MED ORDER — METHOCARBAMOL 500 MG PO TABS
500.0000 mg | ORAL_TABLET | Freq: Four times a day (QID) | ORAL | Status: DC | PRN
  Administered 2015-05-01: 500 mg via ORAL
  Filled 2015-04-28 (×2): qty 1

## 2015-04-28 MED ORDER — ONDANSETRON HCL 4 MG/2ML IJ SOLN
INTRAMUSCULAR | Status: AC
  Filled 2015-04-28: qty 2

## 2015-04-28 MED ORDER — MAGNESIUM CITRATE PO SOLN: 1.0000 | Freq: Once | ORAL | Status: DC | PRN

## 2015-04-28 MED ORDER — DEXAMETHASONE SODIUM PHOSPHATE 10 MG/ML IJ SOLN
10.0000 mg | Freq: Once | INTRAMUSCULAR | Status: AC
  Administered 2015-04-29: 10 mg via INTRAVENOUS
  Filled 2015-04-28: qty 1

## 2015-04-28 MED ORDER — PROPOFOL 10 MG/ML IV BOLUS
INTRAVENOUS | Status: DC | PRN
  Administered 2015-04-28 (×2): 10 mg via INTRAVENOUS

## 2015-04-28 MED ORDER — PREDNISONE 2.5 MG PO TABS
2.5000 mg | ORAL_TABLET | ORAL | Status: DC
  Administered 2015-04-30 – 2015-05-02 (×2): 2.5 mg via ORAL
  Filled 2015-04-28 (×2): qty 1

## 2015-04-28 SURGICAL SUPPLY — 34 items
BAG ZIPLOCK 12X15 (MISCELLANEOUS) IMPLANT
CAPT HIP TOTAL 2 ×2 IMPLANT
CLOTH BEACON ORANGE TIMEOUT ST (SAFETY) ×2 IMPLANT
COVER PERINEAL POST (MISCELLANEOUS) ×2 IMPLANT
DRAPE STERI IOBAN 125X83 (DRAPES) ×2 IMPLANT
DRAPE U-SHAPE 47X51 STRL (DRAPES) ×4 IMPLANT
DRSG AQUACEL AG ADV 3.5X 6 (GAUZE/BANDAGES/DRESSINGS) ×2 IMPLANT
DRSG AQUACEL AG ADV 3.5X10 (GAUZE/BANDAGES/DRESSINGS) ×2 IMPLANT
DURAPREP 26ML APPLICATOR (WOUND CARE) ×2 IMPLANT
ELECT REM PT RETURN 15FT ADLT (MISCELLANEOUS) IMPLANT
ELECT REM PT RETURN 9FT ADLT (ELECTROSURGICAL) ×2
ELECTRODE REM PT RTRN 9FT ADLT (ELECTROSURGICAL) ×1 IMPLANT
GLOVE BIOGEL M 7.0 STRL (GLOVE) IMPLANT
GLOVE BIOGEL M STRL SZ7.5 (GLOVE) IMPLANT
GLOVE BIOGEL PI IND STRL 7.5 (GLOVE) ×2 IMPLANT
GLOVE BIOGEL PI IND STRL 8.5 (GLOVE) ×1 IMPLANT
GLOVE BIOGEL PI INDICATOR 7.5 (GLOVE) ×2
GLOVE BIOGEL PI INDICATOR 8.5 (GLOVE) ×1
GLOVE ECLIPSE 8.0 STRL XLNG CF (GLOVE) ×4 IMPLANT
GLOVE ORTHO TXT STRL SZ7.5 (GLOVE) ×8 IMPLANT
GOWN STRL REUS W/TWL LRG LVL3 (GOWN DISPOSABLE) ×2 IMPLANT
GOWN STRL REUS W/TWL XL LVL3 (GOWN DISPOSABLE) ×2 IMPLANT
HOLDER FOLEY CATH W/STRAP (MISCELLANEOUS) ×2 IMPLANT
LIQUID BAND (GAUZE/BANDAGES/DRESSINGS) ×2 IMPLANT
PACK ANTERIOR HIP CUSTOM (KITS) ×2 IMPLANT
SAW OSC TIP CART 19.5X105X1.3 (SAW) ×2 IMPLANT
SUT MNCRL AB 4-0 PS2 18 (SUTURE) ×2 IMPLANT
SUT VIC AB 1 CT1 36 (SUTURE) ×6 IMPLANT
SUT VIC AB 2-0 CT1 27 (SUTURE) ×2
SUT VIC AB 2-0 CT1 TAPERPNT 27 (SUTURE) ×2 IMPLANT
SUT VLOC 180 0 24IN GS25 (SUTURE) ×2 IMPLANT
TRAY FOLEY W/METER SILVER 14FR (SET/KITS/TRAYS/PACK) IMPLANT
TRAY FOLEY W/METER SILVER 16FR (SET/KITS/TRAYS/PACK) ×2 IMPLANT
WATER STERILE IRR 1500ML POUR (IV SOLUTION) ×2 IMPLANT

## 2015-04-28 NOTE — Transfer of Care (Signed)
Immediate Anesthesia Transfer of Care Note  Patient: [name]  Procedure(s) Performed: Procedure(s): LEFT HIP CONVERSION PREVIOUS HIP SURGERY TO TOTAL HIP ARTHROPLASTY (ANTERIOR) (Left)  Patient Location: PACU  Anesthesia Type:Spinal  Level of Consciousness: sedated  Airway & Oxygen Therapy: Patient Spontanous Breathing and Patient connected to face mask oxygen  Post-op Assessment: Report given to RN and Post -op Vital signs reviewed and stable  Post vital signs: Reviewed and stable  Last Vitals:  Filed Vitals:   04/28/15 1022  BP: 162/74  Pulse: 72  Temp: 36.3 C  Resp: 18    Complications: No apparent anesthesia complications

## 2015-04-28 NOTE — Anesthesia Postprocedure Evaluation (Signed)
  Anesthesia Post-op Note  Patient: [name]  Procedure(s) Performed: Procedure(s): LEFT HIP CONVERSION PREVIOUS HIP SURGERY TO TOTAL HIP ARTHROPLASTY (ANTERIOR) (Left)  Patient Location: PACU  Anesthesia Type:Spinal  Level of Consciousness: awake, alert , oriented and patient cooperative  Airway and Oxygen Therapy: Patient Spontanous Breathing and Patient connected to nasal cannula oxygen  Post-op Pain: none  Post-op Assessment: Post-op Vital signs reviewed, Patient's Cardiovascular Status Stable, Respiratory Function Stable, Patent Airway, No signs of Nausea or vomiting, Pain level controlled, No headache, No backache and Spinal receding   LLE Sensation: Numbness   RLE Sensation: Numbness L Sensory Level: T12-Inguinal (groin) region R Sensory Level: T12-Inguinal (groin) region  Post-op Vital Signs: Reviewed and stable  Last Vitals:  Filed Vitals:   04/28/15 1630  BP: 132/55  Pulse: 63  Temp: 36.3 C  Resp: 17    Complications: No apparent anesthesia complications

## 2015-04-28 NOTE — Anesthesia Preprocedure Evaluation (Addendum)
Anesthesia Evaluation  Patient identified by MRN, date of birth, ID band Patient awake    Reviewed: Allergy & Precautions, NPO status , Patient's Chart, lab work & pertinent test results  History of Anesthesia Complications Negative for: history of anesthetic complications  Airway Mallampati: II  TM Distance: >3 FB Neck ROM: Full    Dental  (+) Missing, Dental Advisory Given, Caps, Implants   Pulmonary shortness of breath, COPD (steroids),  COPD inhaler and oxygen dependent, former smoker (quit 2012),    breath sounds clear to auscultation       Cardiovascular (-) angina Rhythm:Regular Rate:Normal  '08 ECHO:  EF 75%, very mild AS   Neuro/Psych negative neurological ROS     GI/Hepatic negative GI ROS, Neg liver ROS,   Endo/Other  Hypothyroidism   Renal/GU negative Renal ROS     Musculoskeletal  (+) Arthritis , Osteoarthritis,    Abdominal   Peds  Hematology negative hematology ROS (+)   Anesthesia Other Findings   Reproductive/Obstetrics                           Anesthesia Physical Anesthesia Plan  ASA: III  Anesthesia Plan: Spinal   Post-op Pain Management:    Induction:   Airway Management Planned: Natural Airway and Simple Face Mask  Additional Equipment:   Intra-op Plan:   Post-operative Plan:   Informed Consent: I have reviewed the patients History and Physical, chart, labs and discussed the procedure including the risks, benefits and alternatives for the proposed anesthesia with the patient or authorized representative who has indicated his/her understanding and acceptance.   Dental advisory given  Plan Discussed with: CRNA and Surgeon  Anesthesia Plan Comments: (Plan routine monitors, SAB)        Anesthesia Quick Evaluation

## 2015-04-28 NOTE — Progress Notes (Signed)
Utilization review completed.  

## 2015-04-28 NOTE — Anesthesia Procedure Notes (Addendum)
Spinal Patient location during procedure: OR Start time: 04/28/2015 12:54 PM End time: 04/28/2015 12:59 PM Staffing Anesthesiologist: Annye Asa Resident/CRNA: Darlys Gales R Performed by: resident/CRNA  Preanesthetic Checklist Completed: patient identified, site marked, surgical consent, pre-op evaluation, timeout performed, IV checked, risks and benefits discussed and monitors and equipment checked Spinal Block Patient position: sitting Prep: Betadine Patient monitoring: heart rate, continuous pulse ox and blood pressure Approach: midline Location: L3-4 Injection technique: single-shot Needle Needle type: Spinocan  Needle gauge: 22 G Needle length: 9 cm Needle insertion depth: 8 cm Assessment Sensory level: T6 Additional Notes Expiration date of kit checked and confirmed. Patient tolerated procedure well, without complications.

## 2015-04-28 NOTE — Interval H&P Note (Signed)
History and Physical Interval Note:  04/28/2015 11:54 AM  [name]  has presented today for surgery, with the diagnosis of left failed percutaneous failed hip surgery  The various methods of treatment have been discussed with the patient and family. After consideration of risks, benefits and other options for treatment, the patient has consented to  Procedure(s): [place] (ANTERIOR) (Left) as a surgical intervention .  The patient's history has been reviewed, patient examined, no change in status, stable for surgery.  I have reviewed the patient's chart and labs.  Questions were answered to the patient's satisfaction.     Mauri Pole

## 2015-04-28 NOTE — Progress Notes (Signed)
Portable AP Pelvis and Lateral Left Hip X-rays done. 

## 2015-04-28 NOTE — Brief Op Note (Signed)
04/28/2015  3:31 PM  PATIENT:  [name]  81 y.o. 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  PROCEDURE:  Procedure(s): LEFT HIP CONVERSION PREVIOUS HIP SURGERY TO TOTAL HIP ARTHROPLASTY (ANTERIOR) (Left)  SURGEON:  Surgeon(s) and Role:    * Paralee Cancel, MD - Primary  PHYSICIAN ASSISTANT: Danae Orleans, PA-C  ANESTHESIA:   spinal  EBL:  Total I/O In: 2000 [I.V.:2000] Out: 750 [Urine:150; Blood:600]  BLOOD ADMINISTERED:none  DRAINS: none   LOCAL MEDICATIONS USED:  NONE  SPECIMEN:  No Specimen  DISPOSITION OF SPECIMEN:  N/A  COUNTS:  YES  TOURNIQUET:  * No tourniquets in log *  DICTATION: .Other Dictation: Dictation Number [id number]  PLAN OF CARE: Admit to inpatient   PATIENT DISPOSITION:  PACU - hemodynamically stable.   Delay start of Pharmacological VTE agent (>24hrs) due to surgical blood loss or risk of bleeding: no

## 2015-04-29 ENCOUNTER — Encounter (HOSPITAL_COMMUNITY): Payer: Self-pay | Admitting: Orthopedic Surgery

## 2015-04-29 LAB — CBC
HEMATOCRIT: 33.1 % — AB (ref 39.0–52.0)
HEMOGLOBIN: 11 g/dL — AB (ref 13.0–17.0)
MCH: 30.6 pg (ref 26.0–34.0)
MCHC: 33.2 g/dL (ref 30.0–36.0)
MCV: 91.9 fL (ref 78.0–100.0)
Platelets: 240 10*3/uL (ref 150–400)
RBC: 3.6 MIL/uL — AB (ref 4.22–5.81)
RDW: 13.1 % (ref 11.5–15.5)
WBC: 10.7 10*3/uL — ABNORMAL HIGH (ref 4.0–10.5)

## 2015-04-29 LAB — BASIC METABOLIC PANEL
ANION GAP: 7 (ref 5–15)
BUN: 20 mg/dL (ref 6–20)
CHLORIDE: 103 mmol/L (ref 101–111)
CO2: 27 mmol/L (ref 22–32)
Calcium: 8.4 mg/dL — ABNORMAL LOW (ref 8.9–10.3)
Creatinine, Ser: 1.13 mg/dL (ref 0.61–1.24)
GFR calc non Af Amer: 57 mL/min — ABNORMAL LOW (ref >60)
Glucose, Bld: 148 mg/dL — ABNORMAL HIGH (ref 65–99)
POTASSIUM: 4.7 mmol/L (ref 3.5–5.1)
Sodium: 137 mmol/L (ref 135–145)

## 2015-04-29 NOTE — Evaluation (Signed)
Physical Therapy Evaluation Patient Details Name: [name] MRN: [id number] DOB: 12-11-1927 Today's Date: 04/29/2015   History of Present Illness  Pt s/p L THR - revision from hip pinning 12/15.  Clinical Impression  Pt s/p L THR presents with decreased L LE strength/ROM and post op pain limiting functional mobility.  Pt should progress well but may need follow up rehab at SNF level to maximize IND and safety prior to return home.    Follow Up Recommendations Home health PT;SNF (dependent on acute stay progress)    Equipment Recommendations  None recommended by PT    Recommendations for Other Services OT consult     Precautions / Restrictions Precautions Precautions: Fall Restrictions Weight Bearing Restrictions: No Other Position/Activity Restrictions: WBAT      Mobility  Bed Mobility Overal bed mobility: Needs Assistance Bed Mobility: Supine to Sit     Supine to sit: Min assist;Mod assist     General bed mobility comments: cues for sequence and use of R LE to self assist  Transfers Overall transfer level: Needs assistance Equipment used: Rolling walker (2 wheeled) Transfers: Sit to/from Stand Sit to Stand: Min assist;Mod assist         General transfer comment: cues for LE management and use of UEs to self assist  Ambulation/Gait Ambulation/Gait assistance: Min assist;Mod assist Ambulation Distance (Feet): 6 Feet Assistive device: Rolling walker (2 wheeled) Gait Pattern/deviations: Step-to pattern;Decreased step length - left;Decreased step length - right;Shuffle;Trunk flexed Gait velocity: decr   General Gait Details: cues for sequence, posture and position from RW - distance ltd by c/o dizziness  Stairs            Wheelchair Mobility    Modified Rankin (Stroke Patients Only)       Balance                                             Pertinent Vitals/Pain Pain Assessment: 0-10 Pain Score: 4  (at rest but 8/10  with activity) Pain Location: L hip Pain Descriptors / Indicators: Aching;Sore Pain Intervention(s): Limited activity within patient's tolerance;Monitored during session;Premedicated before session;Ice applied    Home Living Family/patient expects to be discharged to:: Private residence Living Arrangements: Spouse/significant other Available Help at Discharge: Family Type of Home: House Home Access: Stairs to enter   [employer] of Steps: 1 - threshold Home Layout: One level Home Equipment: Environmental consultant - 2 wheels;Cane - single point;Shower seat;Toilet riser;Bedside commode Additional Comments: has a toilet riser, but may not be accessible    Prior Function Level of Independence: Independent         Comments: wears 2L/min 02 at night     Hand Dominance   Dominant Hand: Right    Extremity/Trunk Assessment   Upper Extremity Assessment: Overall WFL for tasks assessed           Lower Extremity Assessment: LLE deficits/detail   LLE Deficits / Details: Strength at hip 2+/5 with AAROM at hip to 80 flex and 25 abd  Cervical / Trunk Assessment: Normal  Communication   Communication: No difficulties  Cognition Arousal/Alertness: Awake/alert Behavior During Therapy: WFL for tasks assessed/performed Overall Cognitive Status: Within Functional Limits for tasks assessed                      General Comments      Exercises Total  Joint Exercises Ankle Circles/Pumps: AROM;15 reps;Supine;Both Quad Sets: AROM;Both;10 reps;Supine Heel Slides: AAROM;Left;15 reps;Supine Hip ABduction/ADduction: AAROM;Left;15 reps;Supine      Assessment/Plan    PT Assessment Patient needs continued PT services  PT Diagnosis Difficulty walking   PT Problem List Decreased strength;Decreased range of motion;Decreased activity tolerance;Decreased balance;Decreased mobility;Decreased knowledge of use of DME;Pain  PT Treatment Interventions DME instruction;Gait training;Stair  training;Therapeutic activities;Functional mobility training;Therapeutic exercise;Patient/family education   PT Goals (Current goals can be found in the Care Plan section) Acute Rehab PT Goals Patient Stated Goal: Resume previous lifestyle with decreased pain PT Goal Formulation: With patient Time For Goal Achievement: 05/03/15 Potential to Achieve Goals: Good    Frequency 7X/week   Barriers to discharge        Co-evaluation               End of Session Equipment Utilized During Treatment: Gait belt Activity Tolerance: Other (comment) (Ltd by dizziness - BP 115/56) Patient left: in chair;with call bell/phone within reach Nurse Communication: Mobility status         Time: 0825-0858 PT Time Calculation (min) (ACUTE ONLY): 33 min   Charges:   PT Evaluation $Initial PT Evaluation Tier I: 1 Procedure PT Treatments $Therapeutic Exercise: 8-22 mins   PT G Codes:        [name] 2015-05-19, 12:05 PM

## 2015-04-29 NOTE — Progress Notes (Signed)
CSW met with pt / spouse to assist with d/c planning. [facility] ( pt's choice ) is able to accept pt if ST Rehab is needed. MD is hoping pt will progress well enough to return home with HHPT. CSW will assist with d/c planning as needed.  Purcell Nails LCSW [phone]

## 2015-04-29 NOTE — Care Management Note (Signed)
Case Management Note  Patient Details  Name: [name] MRN: [id number] Date of Birth: May 19, 1928  Subjective/Objective:                  LEFT HIP CONVERSION PREVIOUS HIP SURGERY TO TOTAL HIP ARTHROPLASTY (ANTERIOR) (Left) Action/Plan: Discharge planning Expected Discharge Date:                  Expected Discharge Plan:  [name]  In-House Referral:     Discharge planning Services  CM Consult  Post Acute Care Choice:    Choice offered to:     DME Arranged:    DME Agency:     HH Arranged:    [name] Agency:     Status of Service:  Completed, signed off  Medicare Important Message Given:    Date Medicare IM Given:    Medicare IM give by:    Date Additional Medicare IM Given:    Additional Medicare Important Message give by:     If discussed at [place] of Stay Meetings, dates discussed:    Additional Comments: CM notes pt to go to SNF; CSW aware and arranging.  No other CM needs were communicated. Dellie Catholic, RN 04/29/2015, 2:40 PM

## 2015-04-29 NOTE — Discharge Instructions (Signed)

## 2015-04-29 NOTE — NC FL2 (Signed)
[place] LEVEL OF CARE SCREENING TOOL     IDENTIFICATION  Patient Name: [name] Birthdate: Oct 24, 1927 Sex: male Admission Date (Current Location): 04/28/2015  [facility] and [place] Number:     Facility and Address:  [facility],  [place] [address] [place]      Provider Number: [id number]  Attending Physician Name and Address:  Paralee Cancel, MD  Relative Name and Phone Number:       Current Level of Care: Hospital Recommended Level of Care: [place] Prior Approval Number:    Date Approved/Denied:   PASRR Number: [id number]  Discharge Plan: SNF    Current Diagnoses: Patient Active Problem List   Diagnosis Date Noted  . S/P left hip conversion to [facility] 04/28/2015  . Fracture of femoral neck, left ([place]) 06/07/2014  . Hypothyroidism 06/04/2014  . Closed left hip fracture ([place]) 06/03/2014  . COPD GOLD IV 06/03/2014  . Hyperlipidemia 06/03/2014  . Hip fracture ([place]) 06/03/2014  . Chronic respiratory failure with hypoxia ([place]) 10/27/2013  . COPD with emphysema ([place]) 07/03/2007    Orientation ACTIVITIES/SOCIAL BLADDER RESPIRATION    Self, Time, Situation, Place  Active Continent Normal  BEHAVIORAL SYMPTOMS/MOOD NEUROLOGICAL BOWEL NUTRITION STATUS   (no behaviors)   Continent Diet  PHYSICIAN VISITS COMMUNICATION OF NEEDS Height & Weight Skin    Verbally 5\' 10"  (177.8 cm) 186 lbs. Surgical wounds          AMBULATORY STATUS RESPIRATION    Assist extensive Normal      Personal Care Assistance Level of Assistance               Functional Limitations Info                SPECIAL CARE FACTORS FREQUENCY  PT (By licensed PT)     PT Frequency: 6/7 x wkly             Additional Factors Info  Code Status Code Status Info: FULL CODE             Current Medications (04/29/2015): Current Facility-Administered Medications  Medication Dose Route Frequency Provider Last Rate Last Dose  .  albuterol (PROVENTIL) (2.5 MG/3ML) 0.083% nebulizer solution 2.5 mg  2.5 mg Inhalation Q4H PRN Danae Orleans, PA-C      . alum & mag hydroxide-simeth (MAALOX/MYLANTA) 200-200-20 MG/5ML suspension 30 mL  30 mL Oral Q4H PRN Danae Orleans, PA-C      . aspirin EC tablet 325 mg  325 mg Oral BID Danae Orleans, PA-C   325 mg at 04/29/15 0747  . bisacodyl (DULCOLAX) suppository 10 mg  10 mg Rectal Daily PRN Danae Orleans, PA-C      . budesonide-formoterol (SYMBICORT) 160-4.5 MCG/ACT inhaler 2 puff  2 puff Inhalation BID Danae Orleans, PA-C   2 puff at 04/29/15 0857  . dexamethasone (DECADRON) injection 10 mg  10 mg Intravenous Once Danae Orleans, PA-C      . diphenhydrAMINE (BENADRYL) capsule 25 mg  25 mg Oral Q6H PRN Danae Orleans, PA-C      . docusate sodium (COLACE) capsule 100 mg  100 mg Oral BID Danae Orleans, PA-C   100 mg at 04/28/15 2338  . ferrous sulfate tablet 325 mg  325 mg Oral TID PC Danae Orleans, PA-C      . HYDROcodone-acetaminophen (NORCO) 7.5-325 MG per tablet 1-2 tablet  1-2 tablet Oral Q4H Danae Orleans, PA-C   2 tablet at 04/29/15 0746  . HYDROmorphone (DILAUDID) injection  0.5-1 mg  0.5-1 mg Intravenous Q2H PRN Danae Orleans, PA-C   0.5 mg at 04/28/15 2347  . levothyroxine (SYNTHROID, LEVOTHROID) tablet 50 mcg  50 mcg Oral QAC breakfast Danae Orleans, PA-C   50 mcg at 04/29/15 0747  . magnesium citrate solution 1 Bottle  1 Bottle Oral Once PRN Danae Orleans, PA-C      . menthol-cetylpyridinium (CEPACOL) lozenge 3 mg  1 lozenge Oral PRN Danae Orleans, PA-C       Or  . phenol (CHLORASEPTIC) mouth spray 1 spray  1 spray Mouth/Throat PRN Danae Orleans, PA-C      . methocarbamol (ROBAXIN) tablet 500 mg  500 mg Oral Q6H PRN Danae Orleans, PA-C       Or  . methocarbamol (ROBAXIN) 500 mg in dextrose 5 % 50 mL IVPB  500 mg Intravenous Q6H PRN Danae Orleans, PA-C   500 mg at 04/28/15 1658  . metoCLOPramide (REGLAN) tablet 5-10 mg  5-10 mg Oral Q8H PRN Danae Orleans, PA-C        Or  . metoCLOPramide (REGLAN) injection 5-10 mg  5-10 mg Intravenous Q8H PRN Danae Orleans, PA-C      . ondansetron [facility]) tablet 4 mg  4 mg Oral Q6H PRN Danae Orleans, PA-C       Or  . ondansetron [facility]) injection 4 mg  4 mg Intravenous Q6H PRN Danae Orleans, PA-C      . polyethylene glycol (MIRALAX / GLYCOLAX) packet 17 g  17 g Oral BID Danae Orleans, PA-C   17 g at 04/28/15 2338  . [START ON 04/30/2015] predniSONE (DELTASONE) tablet 2.5 mg  2.5 mg Oral Q48H Matthew Babish, PA-C      . simvastatin (ZOCOR) tablet 5 mg  5 mg Oral QHS Danae Orleans, PA-C   5 mg at 04/28/15 2338  . sodium chloride 0.9 % 1,000 mL with potassium chloride 10 mEq infusion  100 mL/hr Intravenous Continuous Danae Orleans, PA-C 20 mL/hr at 04/29/15 0452 20 mL/hr at 04/29/15 0452  . tiotropium [facility]) inhalation capsule 18 mcg  18 mcg Inhalation Daily Danae Orleans, PA-C   18 mcg at 04/29/15 4098   Do not use this list as official medication orders. Please verify with discharge summary.  Discharge Medications:   Medication List    ASK your doctor about these medications        albuterol 108 (90 BASE) MCG/ACT inhaler  Commonly known as:  PROVENTIL HFA;VENTOLIN HFA  Inhale 2 puffs into the lungs every 4 (four) hours as needed for wheezing or shortness of breath.     budesonide-formoterol 160-4.5 MCG/ACT inhaler  Commonly known as:  SYMBICORT  Inhale 2 puffs into the lungs 2 (two) times daily.     levothyroxine 50 MCG tablet  Commonly known as:  SYNTHROID, LEVOTHROID  Take 50 mcg by mouth daily.     predniSONE 5 MG tablet  Commonly known as:  DELTASONE  Take 0.5 tablets (2.5 mg total) by mouth daily with breakfast.     simvastatin 5 MG tablet  Commonly known as:  ZOCOR  Take 5 mg by mouth at bedtime.     tiotropium 18 MCG inhalation capsule  Commonly known as:  SPIRIVA HANDIHALER  Place 1 capsule (18 mcg total) into inhaler and inhale daily.        Relevant Imaging Results:  Relevant  Lab Results:  Recent Labs    Additional Information SS # [id number]  Mahogani Holohan, Randall An, LCSW

## 2015-04-29 NOTE — Progress Notes (Signed)
Physical Therapy Treatment Patient Details Name: [name] MRN: [id number] DOB: 1927-12-08 Today's Date: 04/29/2015    History of Present Illness Pt s/p L THR - revision from hip pinning 12/15.    PT Comments    Pt continues motivated but ltd by c/o dizziness with attempts to mobilize.  RN aware.  Follow Up Recommendations  Home health PT;SNF     Equipment Recommendations  None recommended by PT    Recommendations for Other Services OT consult     Precautions / Restrictions Precautions Precautions: Fall Restrictions Weight Bearing Restrictions: Yes LLE Weight Bearing: Weight bearing as tolerated    Mobility  Bed Mobility Overal bed mobility: Needs Assistance Bed Mobility: Sit to Supine       Sit to supine: Min assist;Mod assist   General bed mobility comments: Cues for sequence and use of R LE to self assist  Transfers Overall transfer level: Needs assistance Equipment used: Rolling walker (2 wheeled) Transfers: Sit to/from Stand Sit to Stand: Min assist;Mod assist         General transfer comment: cues for LE management and use of UEs to self assist  Ambulation/Gait Ambulation/Gait assistance: Min assist;Mod assist;+2 safety/equipment Ambulation Distance (Feet): 35 Feet Assistive device: Rolling walker (2 wheeled) Gait Pattern/deviations: Step-to pattern;Decreased step length - right;Decreased step length - left;Shuffle;Trunk flexed Gait velocity: decr   General Gait Details: cues for sequence, posture and position from RW - distance ltd by c/o dizziness   Stairs            Wheelchair Mobility    Modified Rankin (Stroke Patients Only)       Balance Overall balance assessment: Needs assistance Sitting-balance support: No upper extremity supported;Feet supported Sitting balance-Leahy Scale: Good     Standing balance support: Bilateral upper extremity supported;During functional activity Standing balance-Leahy Scale: Fair                       Cognition Arousal/Alertness: Awake/alert Behavior During Therapy: WFL for tasks assessed/performed Overall Cognitive Status: Within Functional Limits for tasks assessed                      Exercises      General Comments        Pertinent Vitals/Pain Pain Assessment: 0-10 Pain Score: 7  Pain Location: L hip Pain Descriptors / Indicators: Aching;Sore Pain Intervention(s): Limited activity within patient's tolerance;Monitored during session;Premedicated before session;Ice applied    Home Living                      Prior Function            PT Goals (current goals can now be found in the care plan section) Acute Rehab PT Goals Patient Stated Goal: Regain independence PT Goal Formulation: With patient Time For Goal Achievement: 05/03/15 Potential to Achieve Goals: Good Progress towards PT goals: Progressing toward goals    Frequency  7X/week    PT Plan Current plan remains appropriate    Co-evaluation             End of Session Equipment Utilized During Treatment: Gait belt Activity Tolerance: Other (comment) (Ltd by c/o dizziness - BP 130/49) Patient left: in bed;with call bell/phone within reach;with family/visitor present     Time: 2947-6546 PT Time Calculation (min) (ACUTE ONLY): 19 min  Charges:  $Gait Training: 8-22 mins  G Codes:      [name] 2015/05/19, 3:59 PM

## 2015-04-29 NOTE — Op Note (Signed)
NAME:  [name], [name] NO.:  [id number]  MEDICAL RECORD NO.:  [id number]  LOCATION:  38                         FACILITY:  [facility]  PHYSICIAN:  Pietro Cassis. Alvan Dame, M.D.  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  PROCEDURES:  1. Conversion of failed left hip surgery. 2. Left total hip arthroplasty. 3. Procedure consists of removing 4 cancellous screws as well as total     hip arthroplasty utilizing DePuy components to size 52 mm Pinnacle     shell, 36+ 4 neutral AltrX liner, and a 7 high Tri-Lock stem with     36+ 1.5 metal ball.  SURGEON:  Pietro Cassis. Alvan Dame, M.D.  ASSISTANT:  Danae Orleans, PA-C.  Note that the [name] was present for the entirety of the case from preoperative position, perioperative management of the operative extremity, general facilitation of the case, and primary wound closure.  ANESTHESIA:  Spinal.  SPECIMENS:  None.  COMPLICATIONS:  None.  BLOOD LOSS:  600 mL.  DRAINS:  None.  INDICATIONS FOR PROCEDURE:  [name] is an 81 year old gentleman with a history of previous left femoral neck fracture.  He has had a percutaneous cannula screw fixation.  In the postoperative period, he was seen and had evidence of standard union of his femoral neck fracture, but developed avascular necrosis later.  He is in the office with increasing left hip pain, with radiographic changes obvious for femoral head avascular necrosis and collapse.  At 81 years of age, he did not wish to proceed  without having any further intervention carried out.  We discussed the risks of infection, DVT, component failure, need for future revision surgeries. He at this point is ready to proceed with conversion to total hip arthroplasty.  Consent obtained.  PROCEDURE IN DETAIL:  The patient was brought to operative theater. Once adequate anesthesia, preop antibiotics, Ancef, 1 g of tranexamic acid as well as 10 mg of Decadron administered.  He was positioned supine on the Hana table.  Once he was adequately positioned with bony prominences padded, he was positioned supine on the Hana table.  Both feet were placed in the boots.  The left lower extremity was then prepped and draped in a sterile fashion.  The time-out was performed identifying the patient, planned procedure, and extremity.  We had marked out his previous incision from previous percutaneous screw removal.  At this point following the time-out, the incision was made 2 cm lateral to the anterior-superior iliac spine in line with the tensor fascia lata muscle.  Soft tissue planes were created.  The tensor fascia was then exposed and the tensor fascia muscle was swept laterally identifying the pericapsular fat tissue.  The circumflex vessels were cauterized and pericapsular fat removed.  I then created an L  capsulotomy preserving the inferior and proximal leaflets with tagged sutures placed. Retractors were then placed intra-articularly.  I had the hip exposed and now attended to the removal of the screws.  I tried to internally rotate the hip and see if I could get the screws out through the same incision that I had created for the hip arthroplasty; however, the hip was tight enough with internal rotation that I was unable to get them, even though I could palpate the most anterior of the proximal screws.  I then elected to make an incision via the previous based incision from his index surgery.  Soft tissue dissection was  carried down through the iliotibial band and the screw was palpable.  We were able to determine that the screw heads were a 5.0-mm head and after getting appropriate instrumentation, all screws were removed without complication or concerns.  Once the screw was removed, we attended back to the hip portion of the case.  Traction was applied to the femur and the neck osteotomy made from trochanteric fossa towards the lesser trochanter.  The femoral head was removed and identified significant femoral head flattening and wrinkling of the articular cartilage as anticipated based on radiographic findings.  Traction was let off and retractors placed anterior, inferior, and posterior.  Labrum and other soft tissue debrided around the hip.  I then began reaming with a 47-mm reamer up to a 51-mm reamer and selected a 52-mm cup.  The knee was noted to have some osteoporotic ilium, for which I placed bone graft from the femoral head into this defect.  A 52- mm cup was then impacted.  The orientation of the cup was confirmed radiographically to have approximately 15-20 degrees of anteversion, forward flexion and abduction about 40 degrees.  The anterior rim of the acetabulum was exposed above the acetabular component.  A single cancellous screw was placed in the ilium.  At this point, the hole eliminator was placed followed by the final 36+ 4 neutral AltrX liner was impacted with good visualized fit.  At this point, the J lateral hook for the table was placed along the proximal aspect of lateral femur and the hip rotated to 100 degrees.  A medial retractor was placed at the medial calcar and then proximally the trochanter retractors were placed.  I then elevated the proximal and posterior capsular tissues off the inside portion of the greater trochanter allowing for further visualization of the proximal femur. With the femur adequately exposed, I used a box osteotome followed by starting  chili pepper broach.  I then broached up to a size 7 broach. With a 7 broach in place, I did a trial reduction, a high-offset neck and 36, 1.5 ball.  The retractors were removed and the hip was reduced.  Fluoroscopy was used to confirm the adequate fill of the prosthesis followed by the position of the components overall.  Once I was satisfied with his positioning, the hip was dislocated. Trial components removed.  The final 7 high Tri-Lock stem was opened. We irrigated the canal again, the final stem was impacted and sat at the level where the broach was.  Based on this, the trial reduction, a 36+ 1.5 metal ball selected based on his age, it was impacted on the clean and dry trunnion and the hip reduced.  The reduction was confirmed without any soft tissue impingement.  At this point, the anterior capsular leaflets were reapproximated to one another using #1 Vicryl.  The remaining wounds were  closed with #1 Vicryl in the tensor lata fascia as well as #0 V-Loc.  Over the incision where the screw was removed by placing #1 Vicryl interrupted single horizontal figure-of-eight suture into the iliotibial band.  The remainder of soft tissues were closed with 2-0 Vicryl and running 4-0 Monocryls.  The both wounds were then cleaned, dried, and dressed sterilely using surgical glue and Aquacel dressing.  He was subsequently brought to the recovery room in stable condition tolerating the procedure well.  Findings reviewed with family.  I am going to allow him to be weightbearing as tolerated, will keep him in hospital for 2-3 days to assess his functionality before determine disposition.     Pietro Cassis Alvan Dame, M.D.     MDO/MEDQ  D:  04/28/2015  T:  04/29/2015  Job:  [id number]

## 2015-04-29 NOTE — Clinical Social Work Note (Signed)
Clinical Social Work Assessment  Patient Details  Name: [name] MRN: [id number] Date of Birth: Dec 23, 1927  Date of referral:  04/29/15               Reason for consult:  Discharge Planning, Facility Placement                Permission sought to share information with:  Chartered certified accountant granted to share information::  Yes, Verbal Permission Granted  Name::        Agency::     Relationship::     Contact Information:     Housing/Transportation Living arrangements for the past 2 months:  Single Family Home Source of Information:  Patient Patient Interpreter Needed:  None Criminal Activity/Legal Involvement Pertinent to Current Situation/Hospitalization:  No - Comment as needed Significant Relationships:    Lives with:  Spouse Do you feel safe going back to the place where you live?  Yes Need for family participation in patient care:  Yes (Comment)  Care giving concerns:  Pt reports that his spouse feels ST Rehab is needed prior to returning home.    Social Worker assessment / plan:  Pt hospitalized on 04/28/15 for pre planned Left THR.  CSW met with pt to assist with d/c planning. PT recommends HHPT vs SNF depending on progress.. Pt reports that his spouse feels he will need ST Rehab at d/c. Pt reports that he would prefer to return home but will follow MD / PT recommendations. CSW will meet again with pt this afternoon following his PT session.  Employment status:  Retired Forensic scientist:  Medicare PT Recommendations:  Not assessed at this time [place] / Referral to community resources:  [place]  Patient/Family's Response to care:  Disposition has not yet been determined.  Patient/Family's Understanding of and Emotional Response to Diagnosis, Current Treatment, and Prognosis:  Pt is aware of his medical status. He hopes to have PT at home but will accept ST Rehab if needed.  Emotional Assessment Appearance:  Appears  stated age Attitude/Demeanor/Rapport:  Other (cooperative) Affect (typically observed):  Calm, Pleasant Orientation:  Oriented to Self, Oriented to Place, Oriented to  Time, Oriented to Situation Alcohol / Substance use:  Not Applicable Psych involvement (Current and /or in the community):  No (Comment)  Discharge Needs  Concerns to be addressed:  Discharge Planning Concerns Readmission within the last 30 days:  No Current discharge risk:  None Barriers to Discharge:  No Barriers Identified   Luretha Rued, [place] 04/29/2015, 10:42 AM

## 2015-04-29 NOTE — Progress Notes (Signed)
Patient ID: [name], male   DOB: September 13, 1927, 81 y.o.   MRN: [id number] Subjective: 1 Day Post-Op Procedure(s) (LRB): LEFT HIP CONVERSION PREVIOUS HIP SURGERY TO TOTAL HIP ARTHROPLASTY (ANTERIOR) (Left)    Patient reports pain as moderate.  Pain with hip flexion activity.  No events over night  Objective:   VITALS:   Filed Vitals:   04/29/15 0838  BP: 117/52  Pulse: 78  Temp: 97.5 F (36.4 C)  Resp: 16    Neurovascular intact Incision: dressing C/D/I, left hip wounds X2  LABS  Recent Labs  04/29/15 0432  HGB 11.0*  HCT 33.1*  WBC 10.7*  PLT 240     Recent Labs  04/29/15 0432  NA 137  K 4.7  BUN 20  CREATININE 1.13  GLUCOSE 148*    No results for input(s): LABPT, INR in the last 72 hours.   Assessment/Plan: 1 Day Post-Op Procedure(s) (LRB): LEFT HIP CONVERSION PREVIOUS HIP SURGERY TO TOTAL HIP ARTHROPLASTY (ANTERIOR) (Left)   Advance diet Up with therapy Discharge home with home health hopefully if we can get him to progress with therapy over next couple days

## 2015-04-29 NOTE — Evaluation (Signed)
Occupational Therapy Evaluation Patient Details Name: [name] MRN: [id number] DOB: [birth year] Today's Date: 04/29/2015    History of Present Illness Pt s/p L THR - revision from hip pinning 12/15.   Clinical Impression   Patient presenting with decreased ADL and functional mobility independence. Pt with increased dizziness during OT eval which limited his ability to participate. Patient independent to mod I PTA. Patient currently functioning at an overall min to mod assist level. Patient will benefit from acute OT to increase overall independence in the areas of ADLs, functional mobility, and overall safety in order to safely discharge to venue listed below vs home depending on acute stay.     Follow Up Recommendations  SNF;Supervision/Assistance - 24 hour    Equipment Recommendations  Other (comment) (AE - reacher, sock aid, LH sponge, LH shoe horn)    Recommendations for Other Services  None at this time   Precautions / Restrictions Precautions Precautions: Fall Restrictions Weight Bearing Restrictions: Yes LLE Weight Bearing: Weight bearing as tolerated Other Position/Activity Restrictions: WBAT    Mobility Bed Mobility Overal bed mobility: Needs Assistance Bed Mobility: Supine to Sit     Supine to sit: Min assist;Mod assist     General bed mobility comments: Pt found seated in recliner upon OT entering/exiting room. See PT note for more information.   Transfers Overall transfer level: Needs assistance Equipment used: Rolling walker (2 wheeled) Transfers: Sit to/from Stand Sit to Stand: Min assist;Mod assist General transfer comment: cues for LE management and use of UEs to self assist    Balance Overall balance assessment: Needs assistance Sitting-balance support: No upper extremity supported;Feet supported Sitting balance-Leahy Scale: Good     Standing balance support: Bilateral upper extremity supported;During functional activity Standing  balance-Leahy Scale: Fair    ADL Overall ADL's : Needs assistance/impaired Eating/Feeding: Set up;Sitting   Grooming: Set up;Sitting   Upper Body Bathing: Set up;Sitting   Lower Body Bathing: Sit to/from stand;Minimal assistance   Upper Body Dressing : Set up;Sitting   Lower Body Dressing: Moderate assistance;Sit to/from Health and safety inspector Details (indicate cue type and reason): did not occur due to dizziness/lightheadedness   Toileting - Clothing Manipulation Details (indicate cue type and reason): did not occur due to dizziness/lightheadedness   Tub/Shower Transfer Details (indicate cue type and reason): did not occur due to dizziness/lightheadedness Functional mobility during ADLs: Rolling walker;Cueing for safety;Minimal assistance General ADL Comments: Pt limited due to complaints of lightheadedness and dizziness. Pt found seated in recliner and stated he has been feeling like this even before getting up with PT this morning. Pt on 2 L/min 02 and =90%, when taking pt off 02 and on RA, sats decreased to 79%. Pt's wife present towards middle of session with multiple questions. Therapist discussed use of hip kit to help increase independence with LB ADLs, therapist showed wife technique for donning TED hose. Pt has all needed DME for safe and effective transfers. Pt will need education on transfers in/out of shower.     Pertinent Vitals/Pain Pain Assessment: 0-10 Pain Score: 9  Pain Location: Left hip (with activity) Pain Descriptors / Indicators: Aching;Sore;Grimacing Pain Intervention(s): Limited activity within patient's tolerance;Monitored during session;Repositioned     Hand Dominance Right   Extremity/Trunk Assessment Upper Extremity Assessment Upper Extremity Assessment: Overall WFL for tasks assessed   Lower Extremity Assessment Lower Extremity Assessment: Defer to PT evaluation LLE Deficits / Details: Strength at hip 2+/5 with AAROM at hip to 80 flex and  25  abd   Cervical / Trunk Assessment Cervical / Trunk Assessment: Normal   Communication Communication Communication: No difficulties   Cognition Arousal/Alertness: Awake/alert Behavior During Therapy: WFL for tasks assessed/performed Overall Cognitive Status: Within Functional Limits for tasks assessed              Home Living Family/patient expects to be discharged to:: Private residence Living Arrangements: Spouse/significant other Available Help at Discharge: Family Type of Home: House Home Access: Stairs to enter [employer] of Steps: 1 - threshold   Home Layout: One level     Bathroom Shower/Tub: Tub/shower unit;Walk-in shower;Curtain;Door   [employer] Toilet: Standard Bathroom Accessibility: Yes   Home Equipment: Environmental consultant - 2 wheels;Cane - single point;Shower seat;Toilet riser;Bedside commode   Additional Comments: has a toilet riser, but may not be accessible      Prior Functioning/Environment Level of Independence: Independent  Comments: wears 2L/min 02 at night    OT Diagnosis: Generalized weakness;Acute pain   OT Problem List: Decreased strength;Decreased range of motion;Decreased activity tolerance;Impaired balance (sitting and/or standing);Decreased safety awareness;Decreased knowledge of use of DME or AE;Pain   OT Treatment/Interventions: Self-care/ADL training;Therapeutic exercise;Energy conservation;DME and/or AE instruction;Therapeutic activities;Patient/family education;Balance training    OT Goals(Current goals can be found in the care plan section) Acute Rehab OT Goals Patient Stated Goal: go to rehab OT Goal Formulation: With patient/family Time For Goal Achievement: 05/13/15 Potential to Achieve Goals: Good ADL Goals Pt Will Perform Grooming: standing;with modified independence Pt Will Perform Lower Body Bathing: with modified independence;sit to/from stand;with adaptive equipment Pt Will Perform Lower Body Dressing: with modified  independence;sit to/from stand;with adaptive equipment Pt Will Transfer to Toilet: with modified independence;ambulating;bedside commode Pt Will Perform Tub/Shower Transfer: Tub transfer;ambulating;rolling walker;with supervision;shower seat Additional ADL Goal #1: Pt will be mod I with functional mobility using RW  OT Frequency: Min 2X/week   Barriers to D/C: None known at this time    End of Session Equipment Utilized During Treatment: Gait belt;Rolling walker  Activity Tolerance: Other (comment) (limited by dizziness and lightheadedness) Patient left: in chair;with call bell/phone within reach;with family/visitor present   Time: 3754-3606 OT Time Calculation (min): 28 min Charges:  OT General Charges $OT Visit: 1 Procedure OT Evaluation $Initial OT Evaluation Tier I: 1 Procedure OT Treatments $Self Care/Home Management : 8-22 mins  Ranbir Chew , MS, OTR/L, CLT Pager: [phone]  04/29/2015, 12:52 PM

## 2015-04-30 DIAGNOSIS — E663 Overweight: Secondary | ICD-10-CM | POA: Diagnosis present

## 2015-04-30 LAB — BASIC METABOLIC PANEL
ANION GAP: 5 (ref 5–15)
ANION GAP: 5 (ref 5–15)
BUN: 27 mg/dL — ABNORMAL HIGH (ref 6–20)
BUN: 31 mg/dL — AB (ref 6–20)
CALCIUM: 8.3 mg/dL — AB (ref 8.9–10.3)
CALCIUM: 8.6 mg/dL — AB (ref 8.9–10.3)
CO2: 30 mmol/L (ref 22–32)
CO2: 30 mmol/L (ref 22–32)
CREATININE: 1.27 mg/dL — AB (ref 0.61–1.24)
Chloride: 99 mmol/L — ABNORMAL LOW (ref 101–111)
Chloride: 101 mmol/L (ref 101–111)
Creatinine, Ser: 1.35 mg/dL — ABNORMAL HIGH (ref 0.61–1.24)
GFR calc Af Amer: 58 mL/min — ABNORMAL LOW (ref >60)
GFR calc Af Amer: 54 mL/min — ABNORMAL LOW (ref >60)
GFR, EST NON AFRICAN AMERICAN: 50 mL/min — AB (ref >60)
GFR, EST NON AFRICAN AMERICAN: 46 mL/min — AB (ref >60)
GLUCOSE: 119 mg/dL — AB (ref 65–99)
GLUCOSE: 129 mg/dL — AB (ref 65–99)
Potassium: 4.6 mmol/L (ref 3.5–5.1)
Potassium: 5.3 mmol/L — ABNORMAL HIGH (ref 3.5–5.1)
Sodium: 134 mmol/L — ABNORMAL LOW (ref 135–145)
Sodium: 136 mmol/L (ref 135–145)

## 2015-04-30 LAB — CBC
HEMATOCRIT: 31 % — AB (ref 39.0–52.0)
Hemoglobin: 10.4 g/dL — ABNORMAL LOW (ref 13.0–17.0)
MCH: 31.2 pg (ref 26.0–34.0)
MCHC: 33.5 g/dL (ref 30.0–36.0)
MCV: 93.1 fL (ref 78.0–100.0)
PLATELETS: 243 10*3/uL (ref 150–400)
RBC: 3.33 MIL/uL — ABNORMAL LOW (ref 4.22–5.81)
RDW: 13.5 % (ref 11.5–15.5)
WBC: 15.1 10*3/uL — AB (ref 4.0–10.5)

## 2015-04-30 NOTE — Progress Notes (Signed)
     Subjective: 2 Days Post-Op Procedure(s) (LRB): LEFT HIP CONVERSION PREVIOUS HIP SURGERY TO TOTAL HIP ARTHROPLASTY (ANTERIOR) (Left)   Patient reports pain as mild, pain controlled well.  We did discuss his his elevated BUN and Creat.  Discussed more fluids today and monitoring with labs later today.  Objective:   VITALS:   Filed Vitals:   04/30/15 0636  BP: 128/53  Pulse: 62  Temp: 97.5 F (36.4 C)  Resp: 16    Dorsiflexion/Plantar flexion intact Incision: dressing C/D/I No cellulitis present Compartment soft  LABS  Recent Labs  04/29/15 0432 04/30/15 0405  HGB 11.0* 10.4*  HCT 33.1* 31.0*  WBC 10.7* 15.1*  PLT 240 243     Recent Labs  04/29/15 0432 04/30/15 0405  NA 137 134*  K 4.7 4.6  BUN 20 27*  CREATININE 1.13 1.27*  GLUCOSE 148* 119*     Assessment/Plan: 2 Days Post-Op Procedure(s) (LRB): LEFT HIP CONVERSION PREVIOUS HIP SURGERY TO TOTAL HIP ARTHROPLASTY (ANTERIOR) (Left) Continue IV fluids BMET monitor later today Up with therapy Discharge to SNF / home possibly tomorrow if he is progressing well  Overweight (BMI 25-29.9) Estimated body mass index is 26.69 kg/(m^2) as calculated from the following:   Height as of this encounter: 5\' 10"  (1.778 m).   Weight as of this encounter: 84.369 kg (186 lb). Patient also counseled that weight may inhibit the healing process Patient counseled that losing weight will help with future health issues     [place]. Lashay Osborne   PAC  04/30/2015, 9:38 AM

## 2015-04-30 NOTE — Progress Notes (Signed)
Physical Therapy Treatment Patient Details Name: [name] MRN: [id number] DOB: [birth year] Today's Date: 04/30/2015    History of Present Illness Pt s/p L THR - revision from hip pinning 12/15.    PT Comments    Pt continues motivated but ltd by dizziness with activity.  Orthostatic BPs taken and recorded by nursing with Standing BP dropping to 81/45.  Follow Up Recommendations  SNF     Equipment Recommendations  None recommended by PT    Recommendations for Other Services OT consult     Precautions / Restrictions Precautions Precautions: Fall Restrictions Weight Bearing Restrictions: No LLE Weight Bearing: Weight bearing as tolerated    Mobility  Bed Mobility Overal bed mobility: Needs Assistance Bed Mobility: Sit to Supine       Sit to supine: Min assist   General bed mobility comments: Cues for sequence and use of R LE to self assist  Transfers Overall transfer level: Needs assistance Equipment used: Rolling walker (2 wheeled) Transfers: Sit to/from Stand Sit to Stand: Min guard         General transfer comment: cues for LE management and use of UEs to self assist  Ambulation/Gait Ambulation/Gait assistance: Min assist Ambulation Distance (Feet): 6 Feet Assistive device: Rolling walker (2 wheeled) Gait Pattern/deviations: Step-to pattern;Decreased step length - right;Decreased step length - left;Shuffle;Trunk flexed Gait velocity: decr   General Gait Details: cues for sequence, posture and position from RW - distance ltd by c/o dizziness   Stairs            Wheelchair Mobility    Modified Rankin (Stroke Patients Only)       Balance                                    Cognition Arousal/Alertness: Awake/alert Behavior During Therapy: WFL for tasks assessed/performed Overall Cognitive Status: Within Functional Limits for tasks assessed                      Exercises Total Joint Exercises Ankle  Circles/Pumps: AROM;15 reps;Supine;Both Quad Sets: AROM;Both;10 reps;Supine Heel Slides: AAROM;Left;Supine;20 reps Hip ABduction/ADduction: AAROM;Left;15 reps;Supine    General Comments        Pertinent Vitals/Pain Pain Assessment: 0-10 Pain Score: 5  Pain Location: L hip/thigh Pain Descriptors / Indicators: Aching;Sore Pain Intervention(s): Limited activity within patient's tolerance;Monitored during session;Premedicated before session;Ice applied    Home Living                      Prior Function            PT Goals (current goals can now be found in the care plan section) Acute Rehab PT Goals Patient Stated Goal: Regain independence PT Goal Formulation: With patient Time For Goal Achievement: 05/03/15 Potential to Achieve Goals: Good Progress towards PT goals: Not progressing toward goals - comment (orthostatic)    Frequency  7X/week    PT Plan Discharge plan needs to be updated    Co-evaluation             End of Session Equipment Utilized During Treatment: Gait belt Activity Tolerance: Other (comment) (orthostatic) Patient left: in bed;with call bell/phone within reach;with family/visitor present     Time: 1448-1530 PT Time Calculation (min) (ACUTE ONLY): 42 min  Charges:  $Therapeutic Exercise: 8-22 mins $Therapeutic Activity: 8-22 mins  G Codes:      Aureliano Oshields 2015/05/26, 5:13 PM

## 2015-04-30 NOTE — Progress Notes (Signed)
Orthostatics done by [facility] with physical therapy.  Supine      136/61 Sitting       134/52 Standing   107/45 Ambulating 81/45  Pt returned to bed by therapy.  BP 141/61.  Will continue to monitor.

## 2015-04-30 NOTE — Clinical Social Work Placement (Signed)
   CLINICAL SOCIAL WORK PLACEMENT  NOTE  Date:  04/30/2015  Patient Details  Name: TEE RICHESON MRN: [id number] Date of Birth: Dec 07, 1927  Clinical Social Work is seeking post-discharge placement for this patient at the [place] level of care (*CSW will initial, date and re-position this form in  chart as items are completed):  No   Patient/family provided with [facility] Work Department's list of facilities offering this level of care within the geographic area requested by the patient (or if unable, by the patient's family).  Yes   Patient/family informed of their freedom to choose among providers that offer the needed level of care, that participate in Medicare, Medicaid or managed care program needed by the patient, have an available bed and are willing to accept the patient.  No   Patient/family informed of [facility]'s ownership interest in [facility] and [facility], as well as of the fact that they are under no obligation to receive care at these facilities.  PASRR submitted to EDS on 04/29/15     PASRR number received on 04/29/15     Existing PASRR number confirmed on       FL2 transmitted to all facilities in geographic area requested by pt/family on 04/29/15     FL2 transmitted to all facilities within larger geographic area on       Patient informed that his/her managed care company has contracts with or will negotiate with certain facilities, including the following:        Yes   Patient/family informed of bed offers received.  Patient chooses bed at [facility]     Physician recommends and patient chooses bed at      Patient to be transferred to   on  .  Patient to be transferred to facility by       Patient family notified on   of transfer.  Name of family member notified:        PHYSICIAN       Additional Comment:    _______________________________________________ Luretha Rued,  [place] 04/30/2015, 2:01 PM

## 2015-04-30 NOTE — Progress Notes (Signed)
Physical Therapy Treatment Patient Details Name: [name] MRN: [id number] DOB: 1928-05-30 Today's Date: 04/30/2015    History of Present Illness Pt s/p L THR - revision from hip pinning 12/15.    PT Comments    Pt motivated but progress with mobility ltd by continued c/o dizziness.  Follow Up Recommendations  Home health PT;SNF     Equipment Recommendations  None recommended by PT    Recommendations for Other Services OT consult     Precautions / Restrictions Precautions Precautions: Fall Restrictions Weight Bearing Restrictions: No LLE Weight Bearing: Weight bearing as tolerated    Mobility  Bed Mobility Overal bed mobility: Needs Assistance Bed Mobility: Supine to Sit     Supine to sit: Min assist     General bed mobility comments: Cues for sequence and use of R LE to self assist  Transfers Overall transfer level: Needs assistance Equipment used: Rolling walker (2 wheeled) Transfers: Sit to/from Stand Sit to Stand: Min assist         General transfer comment: cues for LE management and use of UEs to self assist  Ambulation/Gait Ambulation/Gait assistance: Min assist Ambulation Distance (Feet): 54 Feet Assistive device: Rolling walker (2 wheeled) Gait Pattern/deviations: Step-to pattern;Decreased step length - right;Decreased step length - left;Shuffle;Trunk flexed Gait velocity: decr   General Gait Details: cues for sequence, posture and position from RW - multiple standing rests 2* fatigue and distance ltd by c/o dizziness   Stairs            Wheelchair Mobility    Modified Rankin (Stroke Patients Only)       Balance                                    Cognition Arousal/Alertness: Awake/alert Behavior During Therapy: WFL for tasks assessed/performed Overall Cognitive Status: Within Functional Limits for tasks assessed                      Exercises Total Joint Exercises Ankle Circles/Pumps: AROM;15  reps;Supine;Both Quad Sets: AROM;Both;10 reps;Supine Heel Slides: AAROM;Left;Supine;20 reps Hip ABduction/ADduction: AAROM;Left;15 reps;Supine    General Comments        Pertinent Vitals/Pain Pain Assessment: 0-10 Pain Score: 6  Pain Location: L hip/thigh Pain Descriptors / Indicators: Aching;Sore Pain Intervention(s): Limited activity within patient's tolerance;Monitored during session;Premedicated before session;Ice applied    Home Living                      Prior Function            PT Goals (current goals can now be found in the care plan section) Acute Rehab PT Goals Patient Stated Goal: Regain independence PT Goal Formulation: With patient Time For Goal Achievement: 05/03/15 Potential to Achieve Goals: Good Progress towards PT goals: Progressing toward goals    Frequency  7X/week    PT Plan Current plan remains appropriate    Co-evaluation             End of Session Equipment Utilized During Treatment: Gait belt Activity Tolerance: Other (comment) (continued c/o dizziness) Patient left: in chair;with call bell/phone within reach     Time: 1100-1135 PT Time Calculation (min) (ACUTE ONLY): 35 min  Charges:  $Gait Training: 8-22 mins $Therapeutic Exercise: 8-22 mins  G Codes:      Alizandra Loh 2015/05/13, 12:48 PM

## 2015-05-01 LAB — CBC
HCT: 30 % — ABNORMAL LOW (ref 39.0–52.0)
Hemoglobin: 9.7 g/dL — ABNORMAL LOW (ref 13.0–17.0)
MCH: 30.1 pg (ref 26.0–34.0)
MCHC: 32.3 g/dL (ref 30.0–36.0)
MCV: 93.2 fL (ref 78.0–100.0)
PLATELETS: 213 10*3/uL (ref 150–400)
RBC: 3.22 MIL/uL — AB (ref 4.22–5.81)
RDW: 13.6 % (ref 11.5–15.5)
WBC: 11.1 10*3/uL — AB (ref 4.0–10.5)

## 2015-05-01 LAB — BASIC METABOLIC PANEL
ANION GAP: 4 — AB (ref 5–15)
BUN: 26 mg/dL — ABNORMAL HIGH (ref 6–20)
CO2: 30 mmol/L (ref 22–32)
Calcium: 8.5 mg/dL — ABNORMAL LOW (ref 8.9–10.3)
Chloride: 104 mmol/L (ref 101–111)
Creatinine, Ser: 1.14 mg/dL (ref 0.61–1.24)
GFR, EST NON AFRICAN AMERICAN: 57 mL/min — AB (ref >60)
GLUCOSE: 101 mg/dL — AB (ref 65–99)
POTASSIUM: 5 mmol/L (ref 3.5–5.1)
SODIUM: 138 mmol/L (ref 135–145)

## 2015-05-01 MED ORDER — SODIUM CHLORIDE 0.9 % IV SOLN
INTRAVENOUS | Status: DC
  Administered 2015-05-01: 21:00:00 via INTRAVENOUS
  Administered 2015-05-01: 50 mL/h via INTRAVENOUS

## 2015-05-01 MED ORDER — POLYETHYLENE GLYCOL 3350 17 G PO PACK: 17.0000 g | PACK | Freq: Two times a day (BID) | ORAL | Status: DC

## 2015-05-01 MED ORDER — TAMSULOSIN HCL 0.4 MG PO CAPS
0.4000 mg | ORAL_CAPSULE | Freq: Once | ORAL | Status: AC
  Administered 2015-05-01: 0.4 mg via ORAL
  Filled 2015-05-01: qty 1

## 2015-05-01 MED ORDER — TIZANIDINE HCL 4 MG PO TABS: 4.0000 mg | ORAL_TABLET | Freq: Four times a day (QID) | ORAL | Status: DC | PRN

## 2015-05-01 MED ORDER — ASPIRIN 325 MG PO TBEC: 325.0000 mg | DELAYED_RELEASE_TABLET | Freq: Two times a day (BID) | ORAL | Status: DC

## 2015-05-01 MED ORDER — ACETAMINOPHEN 500 MG PO TABS
1000.0000 mg | ORAL_TABLET | Freq: Three times a day (TID) | ORAL | Status: DC
  Administered 2015-05-02 (×2): 1000 mg via ORAL
  Filled 2015-05-01 (×4): qty 2

## 2015-05-01 MED ORDER — TRAMADOL HCL 50 MG PO TABS
50.0000 mg | ORAL_TABLET | Freq: Four times a day (QID) | ORAL | Status: DC | PRN
  Administered 2015-05-01: 100 mg via ORAL
  Administered 2015-05-02: 50 mg via ORAL
  Filled 2015-05-01: qty 2
  Filled 2015-05-01: qty 1

## 2015-05-01 MED ORDER — HYDROCODONE-ACETAMINOPHEN 5-325 MG PO TABS: 1.0000 | ORAL_TABLET | Freq: Four times a day (QID) | ORAL | Status: DC | PRN

## 2015-05-01 MED ORDER — FERROUS SULFATE 325 (65 FE) MG PO TABS: 325.0000 mg | ORAL_TABLET | Freq: Three times a day (TID) | ORAL | Status: DC

## 2015-05-01 MED ORDER — DOCUSATE SODIUM 100 MG PO CAPS: 100.0000 mg | ORAL_CAPSULE | Freq: Two times a day (BID) | ORAL | Status: DC

## 2015-05-01 NOTE — Progress Notes (Signed)
Physical Therapy Treatment Patient Details Name: [name] MRN: [id number] DOB: 06/29/1927 Today's Date: 05/01/2015    History of Present Illness Pt s/p L THR - revision from hip pinning 12/15.    PT Comments    Pt motivated but progressing slowly and ltd by continued c/o dizziness, SOB with activity 2* COPD, limited endurance and episodes of balance loss with ambulation.   Pt would benefit from follow up rehab at SNF level to maximize IND and SAFETY and minimize FALL risk prior to return home with ltd assist.  Follow Up Recommendations  SNF     Equipment Recommendations  None recommended by PT    Recommendations for Other Services OT consult     Precautions / Restrictions Precautions Precautions: Fall Restrictions Weight Bearing Restrictions: No LLE Weight Bearing: Weight bearing as tolerated    Mobility  Bed Mobility Overal bed mobility: Needs Assistance Bed Mobility: Sit to Supine       Sit to supine: Mod assist   General bed mobility comments: cues for sequence and assist with Bil LEs 2* pt fatigue  Transfers Overall transfer level: Needs assistance Equipment used: Rolling walker (2 wheeled) Transfers: Sit to/from Stand Sit to Stand: Min assist         General transfer comment: cues for LE management and use of UEs to self assist; min assist to bring wt up and fwd and to initially balance in standing  Ambulation/Gait Ambulation/Gait assistance: Min assist;Mod assist;+2 safety/equipment Ambulation Distance (Feet): 28 Feet (3x) Assistive device: Rolling walker (2 wheeled) Gait Pattern/deviations: Step-to pattern;Decreased step length - right;Decreased step length - left;Shuffle;Trunk flexed Gait velocity: decr   General Gait Details: Pt requiring cues for posture and postion from RW with multiple rests required 2* SOB and dizziness.  Pt requiring mod assist for one episode buckling while coughing in standing.   Stairs            Wheelchair  Mobility    Modified Rankin (Stroke Patients Only)       Balance Overall balance assessment: Needs assistance Sitting-balance support: Feet supported;No upper extremity supported Sitting balance-Leahy Scale: Good     Standing balance support: Bilateral upper extremity supported Standing balance-Leahy Scale: Fair                      Cognition Arousal/Alertness: Awake/alert Behavior During Therapy: WFL for tasks assessed/performed Overall Cognitive Status: Within Functional Limits for tasks assessed                      Exercises      General Comments        Pertinent Vitals/Pain Pain Assessment: 0-10 Pain Score: 4  Pain Location: L hip Pain Descriptors / Indicators: Aching;Sore Pain Intervention(s): Premedicated before session;Monitored during session;Limited activity within patient's tolerance;Ice applied    Home Living                      Prior Function            PT Goals (current goals can now be found in the care plan section) Acute Rehab PT Goals Patient Stated Goal: Regain independence PT Goal Formulation: With patient Time For Goal Achievement: 05/03/15 Potential to Achieve Goals: Fair Progress towards PT goals: Progressing toward goals    Frequency  7X/week    PT Plan Current plan remains appropriate    Co-evaluation             End  of Session Equipment Utilized During Treatment: Gait belt Activity Tolerance: Patient limited by fatigue;Other (comment) (ongoing dizziness with BP 143/56 and HR 115 while ambulating) Patient left: in bed;with call bell/phone within reach;with family/visitor present     Time: [id number] PT Time Calculation (min) (ACUTE ONLY): 47 min  Charges:  $Gait Training: 23-37 mins                    G Codes:      Ondine Gemme 05/22/2015, 5:21 PM

## 2015-05-01 NOTE — Progress Notes (Signed)
Physical Therapy Treatment Patient Details Name: [name] MRN: [id number] DOB: June 16, 1928 Today's Date: 05/01/2015    History of Present Illness Pt s/p L THR - revision from hip pinning 12/15.    PT Comments    Pt continues ltd by dizziness at rest and increased with mobilization - BP 115/54 in standing.  RN aware.  Pt unsafe for dc home with spouse at this time.  Follow Up Recommendations  SNF     Equipment Recommendations  None recommended by PT    Recommendations for Other Services OT consult     Precautions / Restrictions Precautions Precautions: Fall Restrictions Weight Bearing Restrictions: No LLE Weight Bearing: Weight bearing as tolerated    Mobility  Bed Mobility Overal bed mobility: Needs Assistance Bed Mobility: Supine to Sit     Supine to sit: Min guard     General bed mobility comments: OOB wtih OT  Transfers Overall transfer level: Needs assistance Equipment used: Rolling walker (2 wheeled) Transfers: Sit to/from Stand Sit to Stand: Min assist         General transfer comment: cues for LE management and use of UEs to self assist  Ambulation/Gait             General Gait Details: Pt stood only with min guard attempting to urinate without success.  Returned to sitting with c/o increasing dizziness - BP 115/54 with HR 96   Stairs            Wheelchair Mobility    Modified Rankin (Stroke Patients Only)       Balance Overall balance assessment: Needs assistance Sitting-balance support: No upper extremity supported;Feet supported Sitting balance-Leahy Scale: Good     Standing balance support: Bilateral upper extremity supported;During functional activity Standing balance-Leahy Scale: Fair                      Cognition Arousal/Alertness: Awake/alert Behavior During Therapy: WFL for tasks assessed/performed Overall Cognitive Status: Within Functional Limits for tasks assessed                       Exercises      General Comments        Pertinent Vitals/Pain Pain Assessment: 0-10 Pain Score: 3  Faces Pain Scale: Hurts little more Pain Location: L hip Pain Descriptors / Indicators: Aching;Sore Pain Intervention(s): Limited activity within patient's tolerance;Monitored during session;Premedicated before session;Ice applied    Home Living                      Prior Function            PT Goals (current goals can now be found in the care plan section) Acute Rehab PT Goals Patient Stated Goal: Regain independence PT Goal Formulation: With patient Time For Goal Achievement: 05/03/15 Potential to Achieve Goals: Fair Progress towards PT goals: Not progressing toward goals - comment (ltd by fatigue and increasing dizziness with time up on feet)    Frequency  7X/week    PT Plan Current plan remains appropriate    Co-evaluation             End of Session Equipment Utilized During Treatment: Gait belt Activity Tolerance: Other (comment) (dizziness with activity) Patient left: in chair;with call bell/phone within reach     Time: 1138-1213 PT Time Calculation (min) (ACUTE ONLY): 35 min  Charges:  $Therapeutic Activity: 23-37 mins  G Codes:      Acsa Estey 05/08/15, 12:52 PM

## 2015-05-01 NOTE — Progress Notes (Signed)
Occupational Therapy Treatment Patient Details Name: [name] MRN: [id number] DOB: [birth year] Today's Date: 05/01/2015    History of present illness Pt s/p L THR - revision from hip pinning 12/15.   OT comments  Patient making slow progress towards OT goals, will continue plan of care for now and continue to recommend ST SNF. Pt limited by deconditioning and complaints of dizziness. Pt found supine in bed with request to go into BR. Pt ambulated into BR and BP=182/64. Pt with unsuccessful urine output, pt reports trying to go into urinal all morning. Therapist checked on patient multiple times and patient continued to want to sit on toilet. Therefore, notified NT of patient's whereabouts and left patient seated on toilet. Would like to see patient again before the weekend to go over use of AE to increase independence with LB ADLs. Will check back to do this as schedule allows.    Follow Up Recommendations  SNF;Supervision/Assistance - 24 hour    Equipment Recommendations  Other (comment) (TBD next venue of care. Am recommending AE - reahcer, sock aid, LH sponge, LH shoe horn)    Recommendations for Other Services  None at this time   Precautions / Restrictions Precautions Precautions: Fall Restrictions Weight Bearing Restrictions: Yes LLE Weight Bearing: Weight bearing as tolerated       Mobility Bed Mobility Overal bed mobility: Needs Assistance Bed Mobility: Supine to Sit     Supine to sit: Min guard     General bed mobility comments: Cues for safety and sequencing for bed mobility   Transfers Overall transfer level: Needs assistance Equipment used: Rolling walker (2 wheeled) Transfers: Sit to/from Stand Sit to Stand: Min assist         General transfer comment: cues for LE management and use of UEs to self assist    Balance Overall balance assessment: Needs assistance Sitting-balance support: No upper extremity supported;Feet supported Sitting  balance-Leahy Scale: Good     Standing balance support: Bilateral upper extremity supported;During functional activity Standing balance-Leahy Scale: Fair    ADL Toilet Transfer: Minimal assistance;RW;BSC;Grab bars Functional mobility during ADLs: Rolling walker;Cueing for safety;Minimal assistance General ADL Comments: Pt ambulated into BR by request. Pt required min verbal cues for safety with RW.      Cognition Behavior During Therapy: WFL for tasks assessed/performed Overall Cognitive Status: Within Functional Limits for tasks assessed                 Pertinent Vitals/ Pain       Pain Assessment: Faces Faces Pain Scale: Hurts little more Pain Location: L hip Pain Descriptors / Indicators: Sore;Discomfort;Guarding Pain Intervention(s): Monitored during session   Frequency Min 2X/week     Progress Toward Goals  OT Goals(current goals can now befound in the care plan section)  Progress towards OT goals: Progressing toward goals     Plan Discharge plan remains appropriate    End of Session Equipment Utilized During Treatment: Gait belt;Rolling walker   Activity Tolerance Patient tolerated treatment well   Patient Left with call bell/phone within reach (On toilet, NT notified of patient's whereabouts)  Nurse Communication Mobility status    Time: [id number] OT Time Calculation (min): 22 min  Charges: OT General Charges $OT Visit: 1 Procedure OT Treatments $Self Care/Home Management : 8-22 mins  Winnifred Dufford , MS, OTR/L, CLT Pager: [phone]  05/01/2015, 9:52 AM

## 2015-05-01 NOTE — Care Management Important Message (Signed)
Important Message  Patient Details  Name: [name] MRN: [id number] Date of Birth: [birth year]   Medicare Important Message Given:  Yes    Camillo Flaming 05/01/2015, 12:29 [place] Message  Patient Details  Name: [name] MRN: [id number] Date of Birth: [birth year]   Medicare Important Message Given:  Yes    Camillo Flaming 05/01/2015, 12:25 PM

## 2015-05-01 NOTE — Progress Notes (Signed)
     Subjective: 3 Days Post-Op Procedure(s) (LRB): LEFT HIP CONVERSION PREVIOUS HIP SURGERY TO TOTAL HIP ARTHROPLASTY (ANTERIOR) (Left)   Patient reports pain as mild, pain controlled. States some dizziness, but ok with sitting.  States that the last time he had to change his medications, but currently he is on what he left the hospital on last time.  We will change the medication up a little bit.   Objective:   VITALS:   Filed Vitals:   05/01/15 0527  BP: 159/59  Pulse: 77  Temp: 98.7 F (37.1 C)  Resp: 15    Dorsiflexion/Plantar flexion intact Incision: dressing C/D/I No cellulitis present Compartment soft  LABS  Recent Labs  04/29/15 0432 04/30/15 0405 05/01/15 0525  HGB 11.0* 10.4* 9.7*  HCT 33.1* 31.0* 30.0*  WBC 10.7* 15.1* 11.1*  PLT 240 243 213     Recent Labs  04/30/15 0405 04/30/15 1825 05/01/15 0525  NA 134* 136 138  K 4.6 5.3* 5.0  BUN 27* 31* 26*  CREATININE 1.27* 1.35* 1.14  GLUCOSE 119* 129* 101*     Assessment/Plan: 3 Days Post-Op Procedure(s) (LRB): LEFT HIP CONVERSION PREVIOUS HIP SURGERY TO TOTAL HIP ARTHROPLASTY (ANTERIOR) (Left)  Up with therapy  D/c planning being work on.    [name]   PAC  05/01/2015, 10:21 AM

## 2015-05-02 MED ORDER — TAMSULOSIN HCL 0.4 MG PO CAPS: 0.4000 mg | ORAL_CAPSULE | Freq: Every day | ORAL | Status: DC

## 2015-05-02 MED ORDER — TAMSULOSIN HCL 0.4 MG PO CAPS
0.4000 mg | ORAL_CAPSULE | Freq: Every day | ORAL | Status: DC
  Administered 2015-05-02: 0.4 mg via ORAL
  Filled 2015-05-02 (×2): qty 1

## 2015-05-02 MED ORDER — ASPIRIN 325 MG PO TBEC: 325.0000 mg | DELAYED_RELEASE_TABLET | Freq: Two times a day (BID) | ORAL | Status: DC

## 2015-05-02 MED ORDER — TRAMADOL HCL 50 MG PO TABS: 50.0000 mg | ORAL_TABLET | Freq: Four times a day (QID) | ORAL | Status: DC | PRN

## 2015-05-02 MED ORDER — ACETAMINOPHEN 500 MG PO TABS: 1000.0000 mg | ORAL_TABLET | Freq: Three times a day (TID) | ORAL | Status: DC

## 2015-05-02 NOTE — Progress Notes (Signed)
Physical Therapy Treatment Patient Details Name: [name] MRN: [id number] DOB: [birth year] Today's Date: 05/02/2015    History of Present Illness Pt s/p L THR - revision from hip pinning 12/15.    PT Comments    Pt with improved mood and activity tolerance this am 2* partial resolution of ongoing dizziness.  Reviewed therex, car transfers and pursed lip breathing with pt.  Follow Up Recommendations  SNF     Equipment Recommendations  None recommended by PT    Recommendations for Other Services OT consult     Precautions / Restrictions Precautions Precautions: Fall Restrictions Weight Bearing Restrictions: No LLE Weight Bearing: Weight bearing as tolerated    Mobility  Bed Mobility               General bed mobility comments: OOB with nursing  Transfers Overall transfer level: Needs assistance Equipment used: Rolling walker (2 wheeled) Transfers: Sit to/from Stand Sit to Stand: Min assist         General transfer comment: cues for LE management and use of UEs to self assist; min assist to bring wt up and fwd and to initially balance in standing  Ambulation/Gait Ambulation/Gait assistance: Min assist Ambulation Distance (Feet): 33 Feet (3x) Assistive device: Rolling walker (2 wheeled) Gait Pattern/deviations: Step-to pattern;Decreased step length - right;Decreased step length - left;Shuffle;Trunk flexed Gait velocity: decr   General Gait Details: Pt requiring cues for pursed lip breathing,posture and position from RW with multiple rests required 2* SOB and dizziness.   Stairs            Wheelchair Mobility    Modified Rankin (Stroke Patients Only)       Balance                                    Cognition Arousal/Alertness: Awake/alert Behavior During Therapy: WFL for tasks assessed/performed Overall Cognitive Status: Within Functional Limits for tasks assessed                      Exercises Total Joint  Exercises Ankle Circles/Pumps: AROM;15 reps;Supine;Both Quad Sets: AROM;Both;10 reps;Supine Heel Slides: AAROM;Left;Supine;20 reps Hip ABduction/ADduction: AAROM;Left;Supine;20 reps Long Arc Quad: 10 reps;AAROM;AROM;Left    General Comments        Pertinent Vitals/Pain Pain Assessment: 0-10 Pain Score: 3  Pain Location: L hip Pain Descriptors / Indicators: Aching;Sore Pain Intervention(s): Limited activity within patient's tolerance;Monitored during session;Premedicated before session;Ice applied    Home Living                      Prior Function            PT Goals (current goals can now be found in the care plan section) Acute Rehab PT Goals Patient Stated Goal: Regain independence PT Goal Formulation: With patient Time For Goal Achievement: 05/03/15 Potential to Achieve Goals: Fair Progress towards PT goals: Progressing toward goals    Frequency  7X/week    PT Plan Current plan remains appropriate    Co-evaluation             End of Session Equipment Utilized During Treatment: Gait belt Activity Tolerance: Patient tolerated treatment well;Patient limited by fatigue Patient left: in chair;with call bell/phone within reach;with family/visitor present     Time: [id number] PT Time Calculation (min) (ACUTE ONLY): 38 min  Charges:  $Gait Training: 8-22 mins $Therapeutic Exercise: 8-22 mins $  Therapeutic Activity: 8-22 mins                    G Codes:      Brandalynn Ofallon 2015-05-16, 1:11 PM

## 2015-05-02 NOTE — Progress Notes (Signed)
     Subjective: 4 Days Post-Op Procedure(s) (LRB): LEFT HIP CONVERSION PREVIOUS HIP SURGERY TO TOTAL HIP ARTHROPLASTY (ANTERIOR) (Left)   Patient reports pain as mild, pain controlled. Unsure at this point if the change in pain medication is helping with his dizziness yet.  It was reported that he did have to be catheterrized last do to not being able to urinate on his own.  States that this has happened previously after a lung surgery he had, result of that was seeing a Dealer and having the prostate worked on.   Objective:   VITALS:   Filed Vitals:   05/02/15 0549  BP: 138/53  Pulse: 85  Temp: 98 F (36.7 C)  Resp: 15    Dorsiflexion/Plantar flexion intact Incision: dressing C/D/I No cellulitis present Compartment soft  LABS  Recent Labs  04/30/15 0405 05/01/15 0525  HGB 10.4* 9.7*  HCT 31.0* 30.0*  WBC 15.1* 11.1*  PLT 243 213     Recent Labs  04/30/15 0405 04/30/15 1825 05/01/15 0525  NA 134* 136 138  K 4.6 5.3* 5.0  BUN 27* 31* 26*  CREATININE 1.27* 1.35* 1.14  GLUCOSE 119* 129* 101*     Assessment/Plan: 4 Days Post-Op Procedure(s) (LRB): LEFT HIP CONVERSION PREVIOUS HIP SURGERY TO TOTAL HIP ARTHROPLASTY (ANTERIOR) (Left)   Up with therapy Discharge to SNF eventually, when ready Follow up in 2 weeks at [facility]. Follow up with OLIN,Serayah Yazdani D in 2 weeks.  Contact information:  [facility] [address] [place] [phone]       .     [name] [name]   PAC  05/02/2015, 7:56 AM

## 2015-05-02 NOTE — Clinical Social Work Placement (Signed)
   CLINICAL SOCIAL WORK PLACEMENT  NOTE  Date:  05/02/2015  Patient Details  Name: [name] MRN: [id number] Date of Birth: December 09, 1927  Clinical Social Work is seeking post-discharge placement for this patient at the [place] level of care (*CSW will initial, date and re-position this form in  chart as items are completed):  No   Patient/family provided with [place] Work Department's list of facilities offering this level of care within the geographic area requested by the patient (or if unable, by the patient's family).  Yes   Patient/family informed of their freedom to choose among providers that offer the needed level of care, that participate in Medicare, Medicaid or managed care program needed by the patient, have an available bed and are willing to accept the patient.  No   Patient/family informed of [facility]'s ownership interest in [facility] and [facility], as well as of the fact that they are under no obligation to receive care at these facilities.  PASRR submitted to EDS on 04/29/15     PASRR number received on 04/29/15     Existing PASRR number confirmed on       FL2 transmitted to all facilities in geographic area requested by pt/family on 04/29/15     FL2 transmitted to all facilities within larger geographic area on       Patient informed that his/her managed care company has contracts with or will negotiate with certain facilities, including the following:        Yes   Patient/family informed of bed offers received.  Patient chooses bed at [facility]     Physician recommends and patient chooses bed at      Patient to be transferred to [facility] at [place] on 05/02/15.  Patient to be transferred to facility by [facility]     Patient family notified on 05/02/15 of transfer.  Name of family member notified:  SPOUSE     PHYSICIAN       Additional Comment: Pt / spouse are in agreement  with d/c to [facility] today. PT has approved transport by car. NSG has reviewed d/c summary, scripts, avs. D/C summary sent to SNF for review prior to d/c. D/C packet provided to pt / spouse prior to d/c.   _______________________________________________ Luretha Rued, LCSW 05/02/2015, 1:43 PM

## 2015-05-02 NOTE — Discharge Summary (Signed)
Physician Discharge Summary  Patient ID: [name] MRN: [id number] DOB/AGE: 1928/06/20 81 y.o.  Admit date: 04/28/2015 Discharge date:  05/02/2015  Procedures:  Procedure(s) (LRB): LEFT HIP CONVERSION PREVIOUS HIP SURGERY TO TOTAL HIP ARTHROPLASTY (ANTERIOR) (Left)  Attending Physician:  Dr. Paralee Cancel   Admission Diagnoses:   Left hip pain and failure of previous hip surgery   Discharge Diagnoses:  Principal Problem:   S/P left hip conversion to [facility] 2 Active Problems:   Overweight (BMI 25.0-29.9)  Past Medical History  Diagnosis Date  . COPD (chronic obstructive pulmonary disease) ([place])   . Hyperlipidemia   . Hypothyroidism   . Pulmonary nodule, right   . Tobacco abuse     Quit 09/17/10  . History of oxygen administration     Oxygen @ 2 l/m nasally bedtime  . History of kidney stones     lithotripsy x1   . Arthritis     osteoarthritis  . Cancer ([place])     skin cancer only.-no melanoma.    HPI:    [name], 81 y.o. male, has a history of pain and functional disability in the left hip due to failure of previous hip surgery and patient has failed non-surgical conservative treatments for greater than 12 weeks to include NSAID's and/or analgesics, supervised PT with diminished ADL's post treatment, use of assistive devices and activity modification. The indications for the revision total hip arthroplasty are fracture or mechanical failure of one or more component. Onset of symptoms was gradual starting 1 month ago with gradually worsening course since that time. Prior procedures on the left hip include percutaneous screw fixation. Patient currently rates pain in the left hip at 9 out of 10 with activity. There is worsening of pain with activity and weight bearing, trendelenberg gait, pain that interfers with activities of daily living and pain with passive range of motion. Patient has evidence of failure of the percutaneous screws by imaging studies. This  condition presents safety issues increasing the risk of falls.  There is no current active infection. Risks, benefits and expectations were discussed with the patient. Risks including but not limited to the risk of anesthesia, blood clots, nerve damage, blood vessel damage, failure of the prosthesis, infection and up to and including death. Patient understand the risks, benefits and expectations and wishes to proceed with surgery.   PCP: [name], MD   Discharged Condition: good  Hospital Course:  Patient underwent the above stated procedure on 04/28/2015. Patient tolerated the procedure well and brought to the recovery room in good condition and subsequently to the floor.  POD #1 BP: 117/52 ; Pulse: 78 ; Temp: 97.5 F (36.4 C) ; Resp: 16 Patient reports pain as moderate. Pain with hip flexion activity. No events over night. Neurovascular intact and incision: dressing C/D/I, left hip wounds X2  LABS  Basename    HGB  11.0  HCT  33.1   POD #2  BP: 128/53 ; Pulse: 62 ; Temp: 97.5 F (36.4 C) ; Resp: 16 Patient reports pain as mild, pain controlled well. We did discuss his his elevated BUN and Creat. Discussed more fluids today and monitoring with labs later today. Dorsiflexion/plantar flexion intact, incision: dressing C/D/I, no cellulitis present and compartment soft.   LABS  Basename    HGB  10.4  HCT  31.0   POD #3  BP: 159/59 ; Pulse: 77 ; Temp: 98.7 F (37.1 C) ; Resp: 15 Patient reports pain as mild, pain controlled. States  some dizziness, but ok with sitting. States that the last time he had to change his medications, but currently he is on what he left the hospital on last time. We will change the medication up a little bit.  Dorsiflexion/plantar flexion intact, incision: dressing C/D/I, no cellulitis present and compartment soft.   LABS  Basename    HGB  9.7  HCT  30.0   POD #4 BP: 138/53 ; Pulse: 85 ; Temp: 98 F (36.7 C) ; Resp: 15 Patient reports  pain as mild, pain controlled. Unsure at this point if the change in pain medication is helping with his dizziness yet. It was reported that he did have to be catheterrized last do to not being able to urinate on his own. He has since been able to urinate and feels that he has emptied his bladder.  Ready to be discharged to skilled nursing facility.  LABS  Basename    HGB  10.4  HCT  31.0     Discharge Exam: General appearance: alert, cooperative and no distress Extremities: Homans sign is negative, no sign of DVT, no edema, redness or tenderness in the calves or thighs and no ulcers, gangrene or trophic changes  Disposition:   Skilled nursing facility with follow up in 2 weeks   Follow-up Information    Follow up with Mauri Pole, MD. Schedule an appointment as soon as possible for a visit in 2 weeks.   Specialty:  Orthopedic Surgery   Contact information:   [address] [place] [address] [id number]       Discharge Instructions    Call MD / Call 911    Complete by:  As directed   If you experience chest pain or shortness of breath, CALL 911 and be transported to the hospital emergency room.  If you develope a fever above 101 F, pus (white drainage) or increased drainage or redness at the wound, or calf pain, call your surgeon's office.     Change dressing    Complete by:  As directed   Maintain surgical dressing until follow up in the clinic. If the edges start to pull up, may reinforce with tape. If the dressing is no longer working, may remove and cover with gauze and tape, but must keep the area dry and clean.  Call with any questions or concerns.     Constipation Prevention    Complete by:  As directed   Drink plenty of fluids.  Prune juice may be helpful.  You may use a stool softener, such as Colace (over the counter) 100 mg twice a day.  Use MiraLax (over the counter) for constipation as needed.     Diet - low sodium heart healthy    Complete  by:  As directed      Discharge instructions    Complete by:  As directed   Maintain surgical dressing until follow up in the clinic. If the edges start to pull up, may reinforce with tape. If the dressing is no longer working, may remove and cover with gauze and tape, but must keep the area dry and clean.  Follow up in 2 weeks at [facility]. Call with any questions or concerns.     Increase activity slowly as tolerated    Complete by:  As directed   Weight bearing as tolerated with assist device (walker, cane, etc) as directed, use it as long as suggested by your surgeon or therapist, typically at least 4-6 weeks.  TED hose    Complete by:  As directed   Use stockings (TED hose) for 2 weeks on both leg(s).  You may remove them at night for sleeping.             Medication List    TAKE these medications        acetaminophen 500 MG tablet  Commonly known as:  TYLENOL  Take 2 tablets (1,000 mg total) by mouth every 8 (eight) hours.     albuterol 108 (90 BASE) MCG/ACT inhaler  Commonly known as:  PROVENTIL HFA;VENTOLIN HFA  Inhale 2 puffs into the lungs every 4 (four) hours as needed for wheezing or shortness of breath.     aspirin 325 MG EC tablet  Take 1 tablet (325 mg total) by mouth 2 (two) times daily.     budesonide-formoterol 160-4.5 MCG/ACT inhaler  Commonly known as:  SYMBICORT  Inhale 2 puffs into the lungs 2 (two) times daily.     docusate sodium 100 MG capsule  Commonly known as:  COLACE  Take 1 capsule (100 mg total) by mouth 2 (two) times daily.     ferrous sulfate 325 (65 FE) MG tablet  Take 1 tablet (325 mg total) by mouth 3 (three) times daily after meals.     levothyroxine 50 MCG tablet  Commonly known as:  SYNTHROID, LEVOTHROID  Take 50 mcg by mouth daily.     polyethylene glycol packet  Commonly known as:  MIRALAX / GLYCOLAX  Take 17 g by mouth 2 (two) times daily.     predniSONE 5 MG tablet  Commonly known as:  DELTASONE  Take 0.5  tablets (2.5 mg total) by mouth daily with breakfast.     simvastatin 5 MG tablet  Commonly known as:  ZOCOR  Take 5 mg by mouth at bedtime.     tamsulosin 0.4 MG Caps capsule  Commonly known as:  FLOMAX  Take 1 capsule (0.4 mg total) by mouth daily after breakfast.     tiotropium 18 MCG inhalation capsule  Commonly known as:  SPIRIVA HANDIHALER  Place 1 capsule (18 mcg total) into inhaler and inhale daily.     traMADol 50 MG tablet  Commonly known as:  ULTRAM  Take 1-2 tablets (50-100 mg total) by mouth every 6 (six) hours as needed for severe pain.         Signed: [place]. Zita Ozimek   PA-C  05/02/2015, 12:30 PM

## 2015-05-02 NOTE — Progress Notes (Signed)
Report called and given to [facility], IV removed Neta Mends RN 1:30 PM  05-02-2015

## 2015-07-11 ENCOUNTER — Encounter: Payer: Self-pay | Admitting: Pulmonary Disease

## 2015-07-11 ENCOUNTER — Other Ambulatory Visit (INDEPENDENT_AMBULATORY_CARE_PROVIDER_SITE_OTHER): Payer: Medicare Other

## 2015-07-11 ENCOUNTER — Ambulatory Visit (INDEPENDENT_AMBULATORY_CARE_PROVIDER_SITE_OTHER): Payer: Medicare Other | Admitting: Pulmonary Disease

## 2015-07-11 VITALS — BP 142/80 | HR 85 | Ht 70.0 in | Wt 181.0 lb

## 2015-07-11 DIAGNOSIS — R0600 Dyspnea, unspecified: Secondary | ICD-10-CM | POA: Diagnosis not present

## 2015-07-11 DIAGNOSIS — J432 Centrilobular emphysema: Secondary | ICD-10-CM | POA: Diagnosis not present

## 2015-07-11 DIAGNOSIS — J9611 Chronic respiratory failure with hypoxia: Secondary | ICD-10-CM

## 2015-07-11 LAB — COMPREHENSIVE METABOLIC PANEL
ALK PHOS: 87 U/L (ref 39–117)
ALT: 16 U/L (ref 0–53)
AST: 15 U/L (ref 0–37)
Albumin: 3.8 g/dL (ref 3.5–5.2)
BILIRUBIN TOTAL: 0.3 mg/dL (ref 0.2–1.2)
BUN: 16 mg/dL (ref 6–23)
CO2: 30 meq/L (ref 19–32)
Calcium: 9.3 mg/dL (ref 8.4–10.5)
Chloride: 101 mEq/L (ref 96–112)
Creatinine, Ser: 1.1 mg/dL (ref 0.40–1.50)
GFR: 67.55 mL/min (ref >60.00)
GLUCOSE: 93 mg/dL (ref 70–99)
POTASSIUM: 4 meq/L (ref 3.5–5.1)
Sodium: 139 mEq/L (ref 135–145)
Total Protein: 7.7 g/dL (ref 6.0–8.3)

## 2015-07-11 LAB — CBC
HEMATOCRIT: 38.5 % — AB (ref 39.0–52.0)
HEMOGLOBIN: 12.6 g/dL — AB (ref 13.0–17.0)
MCHC: 32.7 g/dL (ref 30.0–36.0)
MCV: 90.4 fl (ref 78.0–100.0)
Platelets: 482 10*3/uL — ABNORMAL HIGH (ref 150.0–400.0)
RBC: 4.26 Mil/uL (ref 4.22–5.81)
RDW: 13.2 % (ref 11.5–15.5)
WBC: 8.3 10*3/uL (ref 4.0–10.5)

## 2015-07-11 NOTE — Progress Notes (Signed)
Chief Complaint  Patient presents with  . Follow-up    Pt c/o increased SOB since hip surgery (04/28/15). Pt notes that SOB is only with exertion, never at rest. Denies chest tightness/CP, wheezing.    History of Present Illness: [name] is a 82 y.o. 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  He had hip surgery in November 2016.  He has noticed getting winded quicker since then.  He denies chest pain.  He gets cough with clear sputum.  He notices more shortness of breath when he uses his upper body muscles.  He denies leg swelling or hemoptysis.  He is using his oxygen at night.  He gets his medications through the [place].  TESTS: PFT 07/14/06 >> FEV1 1.41(51%), FEV1% 60, TLC 7.3(123%), DLCO 75%, no BD  PFT 07/29/10 >> FEV1 1.30(50%), FEV1% 50, TLC 6.39(100%), DLCO 68%, no BD Spirometry 11/17/10 >> FEV1 1.15(36%), FEV1% 56 ONO with RA 11/30/12 >> Test time 9 hrs 30 min.  Mean SpO2 92.8%, low SpO2 86%.  Spent 3 min with SpO2 < 88% Spirometry 10/26/13 >> FEV1 0.92 (30%), FEV1% 39 ONO with 2 liters 01/16/14 >> test time 8 hrs 36 min. Basal SpO2 93%, low SpO2 85%. Spent 4 min with SpO2 < 88%.  PMHx >> HLD, Hypothyroidism  PSHx, Medications, Allergies, Fhx, Shx reviewed.  Physical Exam: BP 142/80 mmHg  Pulse 85  Ht 5\' 10"  (1.778 m)  Wt 181 lb (82.101 kg)  BMI 25.97 kg/m2  SpO2 98%  General - No distress ENT - No sinus tenderness, no oral exudate, no LAN Cardiac - s1s2 regular, no murmur Chest - decreased breath sounds, no wheeze/rales Back - No focal tenderness Abd - Soft, non-tender Ext - No edema Neuro - Normal strength Skin - No rashes Psych - normal mood, and behavior   Impression:  Dyspnea. He has noticed this more since hip surgery.   Plan: - will check CBC, CMET, D dimer and then decide if additional assessment is needed  COPD with emphysema.  Prednisone dependent since May 2015. Plan: -  continue spiriva, symbicort, and prn ventolin  - continue prednisone 5 mg daily  Chronic hypoxic respiratory failure. Plan: - he is to continue 2 liters oxygen at night - he monitors his pulse ox at home   [name], MD [place] Pager:  [phone]

## 2015-07-11 NOTE — Patient Instructions (Signed)
Lab tests today Follow up in 6 months 

## 2015-07-12 LAB — D-DIMER, QUANTITATIVE (NOT AT ARMC): D DIMER QUANT: 1.44 ug{FEU}/mL — AB (ref 0.00–0.48)

## 2015-07-14 ENCOUNTER — Ambulatory Visit (INDEPENDENT_AMBULATORY_CARE_PROVIDER_SITE_OTHER)
Admission: RE | Admit: 2015-07-14 | Discharge: 2015-07-14 | Disposition: A | Discharge status: Home / Self Care | Payer: Medicare Other | Source: Ambulatory Visit | Attending: Pulmonary Disease | Admitting: Pulmonary Disease

## 2015-07-14 ENCOUNTER — Telehealth: Payer: Self-pay | Admitting: Pulmonary Disease

## 2015-07-14 DIAGNOSIS — R7989 Other specified abnormal findings of blood chemistry: Secondary | ICD-10-CM

## 2015-07-14 DIAGNOSIS — R791 Abnormal coagulation profile: Secondary | ICD-10-CM

## 2015-07-14 DIAGNOSIS — R0600 Dyspnea, unspecified: Secondary | ICD-10-CM

## 2015-07-14 MED ORDER — IOHEXOL 350 MG/ML SOLN
80.0000 mL | Freq: Once | INTRAVENOUS | Status: AC | PRN
  Administered 2015-07-14: 80 mL via INTRAVENOUS

## 2015-07-14 NOTE — Telephone Encounter (Signed)
CMP Latest Ref Rng 07/11/2015 05/01/2015 04/30/2015  Glucose 70 - 99 mg/dL 93 101(H) 129(H)  BUN 6 - 23 mg/dL 16 26(H) 31(H)  Creatinine 0.40 - 1.50 mg/dL 1.10 1.14 1.35(H)  Sodium 135 - 145 mEq/L 139 138 136  Potassium 3.5 - 5.1 mEq/L 4.0 5.0 5.3(H)  Chloride 96 - 112 mEq/L 101 104 101  CO2 19 - 32 mEq/L 30 30 30   Calcium 8.4 - 10.5 mg/dL 9.3 8.5(L) 8.6(L)  Total Protein 6.0 - 8.3 g/dL 7.7 - -  Total Bilirubin 0.2 - 1.2 mg/dL 0.3 - -  Alkaline Phos 39 - 117 U/L 87 - -  AST 0 - 37 U/L 15 - -  ALT 0 - 53 U/L 16 - -     CBC Latest Ref Rng 07/11/2015 05/01/2015 04/30/2015  WBC 4.0 - 10.5 K/uL 8.3 11.1(H) 15.1(H)  Hemoglobin 13.0 - 17.0 g/dL 12.6(L) 9.7(L) 10.4(L)  Hematocrit 39.0 - 52.0 % 38.5(L) 30.0(L) 31.0(L)  Platelets 150.0 - 400.0 K/uL 482.0(H) 213 243    Lab Results  Component Value Date   DDIMER 1.44* 07/11/2015    Discussed results with pt.  D-dimer elevated.  Had recent hip surgery, and reports worsening dyspnea since then.  Will arrange for CT angio chest/PE protocol to further assess.

## 2015-07-16 ENCOUNTER — Telehealth: Payer: Self-pay | Admitting: Pulmonary Disease

## 2015-07-16 NOTE — Telephone Encounter (Signed)
Please let him know that CT chest did not show any clots in his lungs.  Nothing else new to explain his symptoms of dypsnea.  No change to current treatment plan.

## 2015-07-16 NOTE — Telephone Encounter (Signed)
Called and spoke to pt. Pt is requesting the results of the CT chest that was done on 1.23.17.   Dr. Halford Chessman please advise. Thanks.

## 2015-07-16 NOTE — Telephone Encounter (Signed)
Patient notified of CT results. Patient verbalized understanding. Nothing further needed.

## 2015-08-25 ENCOUNTER — Other Ambulatory Visit: Payer: Self-pay | Admitting: Pulmonary Disease

## 2016-01-30 ENCOUNTER — Encounter: Payer: Self-pay | Admitting: Pulmonary Disease

## 2016-01-30 ENCOUNTER — Ambulatory Visit: Payer: Medicare Other | Admitting: Pulmonary Disease

## 2016-01-30 ENCOUNTER — Ambulatory Visit (INDEPENDENT_AMBULATORY_CARE_PROVIDER_SITE_OTHER): Payer: Medicare Other | Admitting: Pulmonary Disease

## 2016-01-30 VITALS — BP 138/80 | HR 90 | Ht 70.0 in | Wt 186.6 lb

## 2016-01-30 DIAGNOSIS — J9611 Chronic respiratory failure with hypoxia: Secondary | ICD-10-CM | POA: Diagnosis not present

## 2016-01-30 DIAGNOSIS — J432 Centrilobular emphysema: Secondary | ICD-10-CM

## 2016-01-30 NOTE — Patient Instructions (Signed)
Follow up in 6 months 

## 2016-01-30 NOTE — Progress Notes (Signed)
Current Outpatient Prescriptions on File Prior to Visit  Medication Sig  . albuterol (PROVENTIL HFA;VENTOLIN HFA) 108 (90 BASE) MCG/ACT inhaler Inhale 2 puffs into the lungs every 4 (four) hours as needed for wheezing or shortness of breath.  . budesonide-formoterol (SYMBICORT) 160-4.5 MCG/ACT inhaler Inhale 2 puffs into the lungs 2 (two) times daily.  Marland Kitchen levothyroxine (SYNTHROID, LEVOTHROID) 50 MCG tablet Take 50 mcg by mouth daily.  . predniSONE (DELTASONE) 5 MG tablet Take 0.5 tablets (2.5 mg total) by mouth daily with breakfast. (Patient taking differently: Take 2.5 mg by mouth every other day. )  . simvastatin (ZOCOR) 5 MG tablet Take 5 mg by mouth at bedtime.    Marland Kitchen tiotropium (SPIRIVA HANDIHALER) 18 MCG inhalation capsule Place 1 capsule (18 mcg total) into inhaler and inhale daily.   No current facility-administered medications on file prior to visit.     Chief Complaint  Patient presents with  . Follow-up    6 mo rov. breathing is baseline. c/o cont sob w/exertion & occ prod cough w/white to yellowish mucus. on 2L 02 qsh.    Pulmonary tests PFT 07/14/06 >> FEV1 1.41(51%), FEV1% 60, TLC 7.3(123%), DLCO 75%, no BD  PFT 07/29/10 >> FEV1 1.30(50%), FEV1% 50, TLC 6.39(100%), DLCO 68%, no BD Spirometry 11/17/10 >> FEV1 1.15(36%), FEV1% 56 ONO with RA 11/30/12 >> Test time 9 hrs 30 min.  Mean SpO2 92.8%, low SpO2 86%.  Spent 3 min with SpO2 < 88% Spirometry 10/26/13 >> FEV1 0.92 (30%), FEV1% 39 ONO with 2 liters 01/16/14 >> test time 8 hrs 36 min. Basal SpO2 93%, low SpO2 85%. Spent 4 min with SpO2 < 88%. CT angio chest 07/14/15 >> atherosclerosis, several nodules  Past medical history HLD, Hypothyroidism  Past surgical history, Family history, Social history, Allergies reviewed.  Vital signs BP 138/80 (BP Location: Right Arm, Cuff Size: Normal)   Pulse 90   Ht 5\' 10"  (1.778 m)   Wt 186 lb 9.6 oz (84.6 kg)   SpO2 99%   BMI 26.77 kg/m   History of Present Illness: [name]  is a 82 y.o. 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  His symptoms have been stable.  He can keep up with light activities.  He has occasional cough with thick, white sputum.  He denies chest pain, fever, hemoptysis, or leg swelling.  He uses albuterol several times per week.  He feels his inhaler technique is okay, and doesn't feel like he needs nebulizer therapy.  His wife gets concerned when he breaths through his mouth.  He is using his oxygen at night.  He gets his medications through the [place].   Physical Exam:  General - No distress ENT - No sinus tenderness, no oral exudate, no LAN Cardiac - s1s2 regular, no murmur Chest - decreased breath sounds, no wheeze/rales Back - No focal tenderness Abd - Soft, non-tender Ext - No edema Neuro - Normal strength Skin - No rashes Psych - normal mood, and behavior   Impression:  COPD with emphysema. - Prednisone dependent since May 2015 >> continue prednisone 5 mg daily - continue spiriva, symbicort, and prn ventolin   Chronic hypoxic respiratory failure. - he is to continue 2 liters oxygen at night - he monitors his pulse ox at home   Patient Instructions  Follow up in 6 months    [name], MD [place] [facility] Pager:  [phone] 01/30/2016, 4:14 PM

## 2016-02-02 IMAGING — RF DG HIP OPERATIVE*L*
1 series · 2 of 2 positions shown · non-contrast
Comparison: 06/03/2014.

CLINICAL DATA: Status post ORIF of left hip fracture.

EXAM:
OPERATIVE LEFT HIP

[Series 1: run · 2 of 2 slices shown]
[im 1/2]
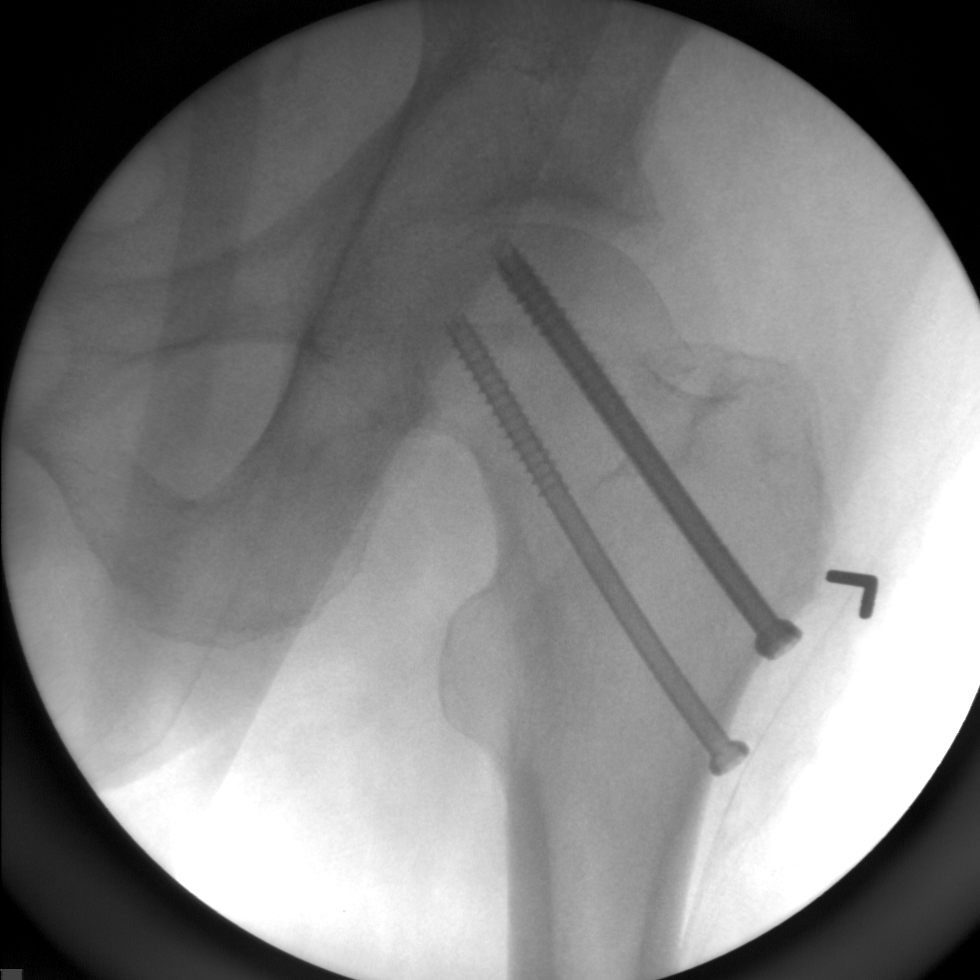
[im 2/2]
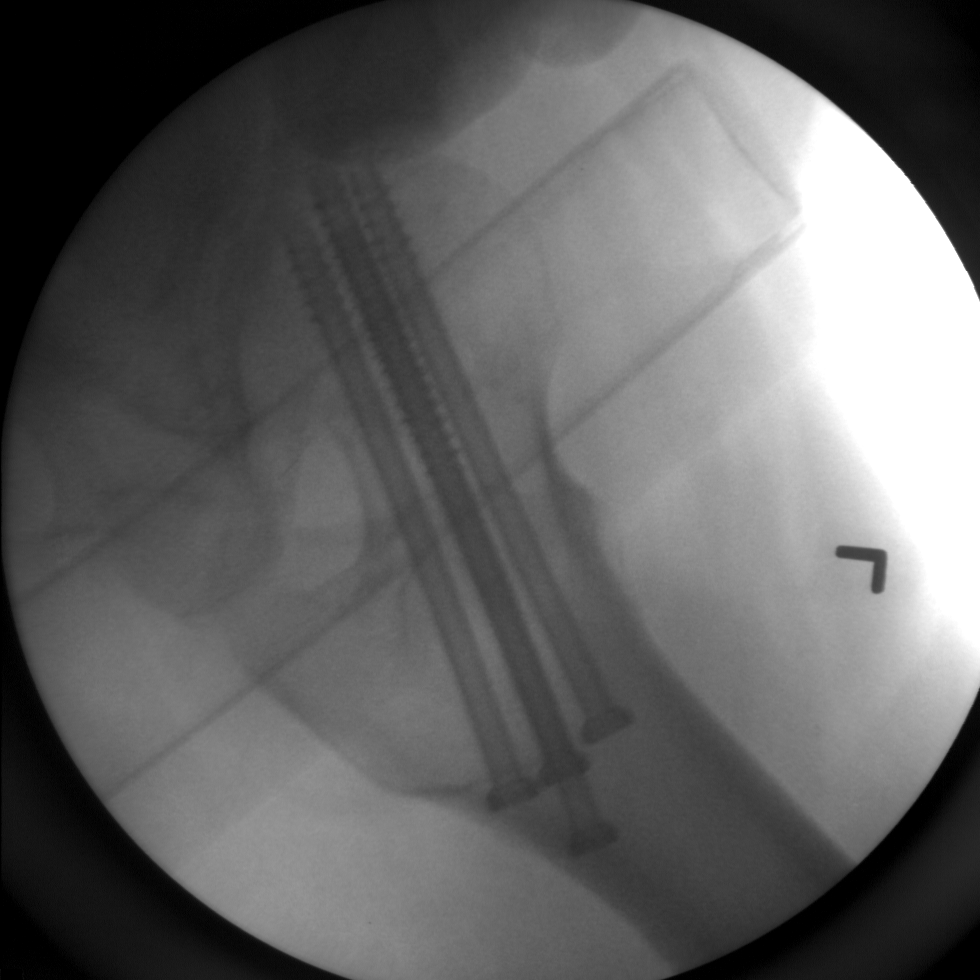

[2 of 2 positions shown; findings below may reference images not displayed]

FINDINGS: Two intraoperative fluoroscopic spot views of the left hip
demonstrate interval placement of what appear to be 4 cannulated
fixation screws traversing the previously noted subcapital femoral
neck fracture. Anatomic alignment appears preserved.
IMPRESSION: 1. Intraoperative documentation of ORIF for left subcapital femoral
neck fracture, as above.

## 2016-07-29 ENCOUNTER — Encounter: Payer: Self-pay | Admitting: Pulmonary Disease

## 2016-07-29 ENCOUNTER — Ambulatory Visit (INDEPENDENT_AMBULATORY_CARE_PROVIDER_SITE_OTHER): Payer: Medicare Other | Admitting: Pulmonary Disease

## 2016-07-29 ENCOUNTER — Telehealth: Payer: Self-pay | Admitting: Pulmonary Disease

## 2016-07-29 VITALS — BP 132/80 | HR 82 | Ht 70.0 in | Wt 190.2 lb

## 2016-07-29 DIAGNOSIS — R918 Other nonspecific abnormal finding of lung field: Secondary | ICD-10-CM

## 2016-07-29 DIAGNOSIS — J432 Centrilobular emphysema: Secondary | ICD-10-CM

## 2016-07-29 DIAGNOSIS — J9611 Chronic respiratory failure with hypoxia: Secondary | ICD-10-CM

## 2016-07-29 DIAGNOSIS — R0609 Other forms of dyspnea: Secondary | ICD-10-CM | POA: Diagnosis not present

## 2016-07-29 DIAGNOSIS — G4734 Idiopathic sleep related nonobstructive alveolar hypoventilation: Secondary | ICD-10-CM

## 2016-07-29 MED ORDER — ALBUTEROL SULFATE (2.5 MG/3ML) 0.083% IN NEBU: 2.5000 mg | INHALATION_SOLUTION | Freq: Four times a day (QID) | RESPIRATORY_TRACT | 12 refills | Status: DC

## 2016-07-29 MED ORDER — ALBUTEROL SULFATE (2.5 MG/3ML) 0.083% IN NEBU: 2.5000 mg | INHALATION_SOLUTION | Freq: Four times a day (QID) | RESPIRATORY_TRACT | 12 refills | Status: DC | PRN

## 2016-07-29 MED ORDER — GUAIFENESIN ER 600 MG PO TB12: 1200.0000 mg | ORAL_TABLET | Freq: Two times a day (BID) | ORAL | Status: AC | PRN

## 2016-07-29 NOTE — Progress Notes (Signed)
Current Outpatient Prescriptions on File Prior to Visit  Medication Sig  . albuterol (PROVENTIL HFA;VENTOLIN HFA) 108 (90 BASE) MCG/ACT inhaler Inhale 2 puffs into the lungs every 4 (four) hours as needed for wheezing or shortness of breath.  . budesonide-formoterol (SYMBICORT) 160-4.5 MCG/ACT inhaler Inhale 2 puffs into the lungs 2 (two) times daily.  Marland Kitchen levothyroxine (SYNTHROID, LEVOTHROID) 50 MCG tablet Take 50 mcg by mouth daily.  . predniSONE (DELTASONE) 5 MG tablet Take 0.5 tablets (2.5 mg total) by mouth daily with breakfast. (Patient taking differently: Take 2.5 mg by mouth every other day. )  . simvastatin (ZOCOR) 5 MG tablet Take 5 mg by mouth at bedtime.    Marland Kitchen tiotropium (SPIRIVA HANDIHALER) 18 MCG inhalation capsule Place 1 capsule (18 mcg total) into inhaler and inhale daily.   No current facility-administered medications on file prior to visit.     Chief Complaint  Patient presents with  . Follow-up    Pt c/o increased SOB, pt states that it is worsening. Pt notes that he did not have this trouble until he had his hip surgery x 1 year ago. Pt states that his O2 stays in upper 90s around 95-98%. Pt states that he is using his Symbicort and Spiriva as directed. Pt wants to discuss medication alternative if available>>combination.     Pulmonary function testing PFT 07/14/06 >> FEV1 1.41(51%), FEV1% 60, TLC 7.3(123%), DLCO 75%, no BD  PFT 07/29/10 >> FEV1 1.30(50%), FEV1% 50, TLC 6.39(100%), DLCO 68%, no BD Spirometry 11/17/10 >> FEV1 1.15(36%), FEV1% 56 Spirometry 10/26/13 >> FEV1 0.92 (30%), FEV1% 39 Spirometry 04/07/15 >> FEV1 0.91, FEV1% 42  Pulmonary tests ONO with RA 11/30/12 >> Test time 9 hrs 30 min.  Mean SpO2 92.8%, low SpO2 86%.  Spent 3 min with SpO2 < 88% ONO with 2 liters 01/16/14 >> test time 8 hrs 36 min. Basal SpO2 93%, low SpO2 85%. Spent 4 min with SpO2 < 88%. CT angio chest 07/14/15 >> atherosclerosis, several nodules  Past medical history HLD,  Hypothyroidism  Past surgical history, Family history, Social history, Allergies reviewed.  Vital signs BP 132/80 (BP Location: Left Arm, Cuff Size: Normal)   Pulse 82   Ht 5\' 10"  (1.778 m)   Wt 190 lb 3.2 oz (86.3 kg)   SpO2 97%   BMI 27.29 kg/m   History of Present Illness: [name] is a 83 y.o. 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  He continues to have dyspnea on exertion.  This has been getting worse since his hip surgery.  His symptom progression has been gradual.  He gets cough with chest congestion, and sometimes hard to bring up sputum.  He denies chest pain, fever, sinus congestion, skin rash, sore throat, leg swelling, or abdominal pain.    He checks his oxygen during the day with activity.  He is using his oxygen at night.  He gets his medications through the [place].   Physical Exam:  General - pleasant ENT - no sinus tenderness, no LAN, no oral exudate Cardiac - regular, no murmur Chest - decreased BS, no wheeze Back - no tenderness Abd - soft, non tender Ext - no edema Neuro - normal strength Skin - no rashes Psych - normal mood   Impression:  Dyspnea. - likely related to deconditioning after hip surgery and progressive of emphysema - encouraged him to maintain his activity level as tolerated  COPD with emphysema. - Prednisone dependent since  May 2015 >> continue 5 mg daily - continue spiriva, symbicort - will have him try nebulized albuterol - he will call if he wants to change LAMA, LABA, and ICS to nebulizer - he can try mucinex for chest congestion  Chronic hypoxic respiratory failure. - continue 2 liters oxygen at night - he monitors his oxygen during the day with home pulse oximeter  History of lung nodules. - these have been waxing/waning over several years - we had detailed discussion about whether additional follow up is needed - he is comfortable with deferring  f/u imaging studies unless his symptoms progress   Patient Instructions  Try mucinex twice per day as needed for chest congestion  Try albuterol in nebulizer every 4 to 6 hours as needed for cough, wheeze, or chest congestion  Call if you want to change some of your other inhalers to nebulized form  Follow up in 6 months    [name], MD [place] [facility] Pager:  [phone] 07/29/2016, 10:24 AM

## 2016-07-29 NOTE — Telephone Encounter (Signed)
Please change script to one vial nebulized q6hrs.

## 2016-07-29 NOTE — Telephone Encounter (Signed)
Rx has been corrected per VS. It has been sent to [facility]. Nothing further was needed.

## 2016-07-29 NOTE — Telephone Encounter (Signed)
Pt just seen today by VS: Patient Instructions  Try mucinex twice per day as needed for chest congestion   Try albuterol in nebulizer every 4 to 6 hours as needed for cough, wheeze, or chest congestion   Call if you want to change some of your other inhalers to nebulized form   Follow up in 6 months   Per Medicare, they will not pay for the albuterol neb with "AS NEEDED" in the directions.  Dr Halford Chessman, please advise if you are okay with adjusting directions.  Thank you.

## 2016-07-29 NOTE — Patient Instructions (Signed)
Try mucinex twice per day as needed for chest congestion  Try albuterol in nebulizer every 4 to 6 hours as needed for cough, wheeze, or chest congestion  Call if you want to change some of your other inhalers to nebulized form  Follow up in 6 months

## 2016-07-30 ENCOUNTER — Telehealth: Payer: Self-pay | Admitting: Pulmonary Disease

## 2016-07-30 MED ORDER — ALBUTEROL SULFATE (2.5 MG/3ML) 0.083% IN NEBU: 2.5000 mg | INHALATION_SOLUTION | Freq: Four times a day (QID) | RESPIRATORY_TRACT | 12 refills | Status: DC

## 2016-07-30 NOTE — Telephone Encounter (Signed)
Called and spoke to pt's wife. She states the pt picked up his nebulizer machine but thought he would also be picking up his albuterol neb med too. Advised her that the medication was sent to [facility] mail order pharmacy on 07/29/16 and will be mailed to them. Pt's wife verbalized understanding and denied any further questions or concerns at this time.

## 2016-07-30 NOTE — Telephone Encounter (Signed)
Rx has been fixed and faxed to [facility]. Nothing further was needed.

## 2016-09-06 DIAGNOSIS — C4490 Unspecified malignant neoplasm of skin, unspecified: Secondary | ICD-10-CM

## 2016-09-06 HISTORY — DX: Unspecified malignant neoplasm of skin, unspecified: C44.90

## 2016-09-13 ENCOUNTER — Encounter: Payer: Self-pay | Admitting: Radiation Oncology

## 2016-09-14 ENCOUNTER — Encounter: Payer: Self-pay | Admitting: Radiation Oncology

## 2016-09-14 NOTE — Progress Notes (Signed)
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 Dianna Limbo, Md  Past skin cancers, if any: left ear scabbed and incision,   1) Location/Histology/Intervention: Mohs Surgery 08/26/16 left ear  2) Location/Histology/Intervention: 04/19/16 suture removal with Dr. Harvel Quale   3) Location/Histology/Intervention:   History of Blistering sunburns, if any: [name] worked outdoors, numerous sunburns, no sunscreen, worse in [place] in [place], January 1949,swimming,    [place]: NO  Prior radiation?  NO  Pacemaker/ICD?  NO  Is the patient on methotrexate? NO   Current Complaints / other details:  Married, 4 children, COPD, sob,  Father unknown cancer,  BP (!) 157/79 (BP Location: Left Arm, Patient Position: Sitting, Cuff Size: Normal)   Pulse 97   Temp 97.7 F (36.5 C) (Oral)   Resp (!) 22 Comment: has copd  sob w/exertion  Ht 5\' 10"  (1.778 m)   Wt 182 lb 9.6 oz (82.8 kg)   SpO2 100% Comment: room air after sitting a few minutes  BMI 26.20 kg/m   Wt Readings from Last 3 Encounters:  09/15/16 182 lb 9.6 oz (82.8 kg)  07/29/16 190 lb 3.2 oz (86.3 kg)  01/30/16 186 lb 9.6 oz (84.6 kg)

## 2016-09-15 ENCOUNTER — Encounter: Payer: Self-pay | Admitting: Radiation Oncology

## 2016-09-15 ENCOUNTER — Ambulatory Visit
Admission: RE | Admit: 2016-09-15 | Discharge: 2016-09-15 | Disposition: A | Discharge status: Home / Self Care | Payer: Medicare Other | Source: Ambulatory Visit | Attending: Radiation Oncology | Admitting: Radiation Oncology

## 2016-09-15 VITALS — BP 157/79 | HR 97 | Temp 97.7°F | Resp 22 | Ht 70.0 in | Wt 182.6 lb

## 2016-09-15 DIAGNOSIS — Z79899 Other long term (current) drug therapy: Secondary | ICD-10-CM | POA: Insufficient documentation

## 2016-09-15 DIAGNOSIS — Z96642 Presence of left artificial hip joint: Secondary | ICD-10-CM | POA: Diagnosis not present

## 2016-09-15 DIAGNOSIS — M1990 Unspecified osteoarthritis, unspecified site: Secondary | ICD-10-CM | POA: Insufficient documentation

## 2016-09-15 DIAGNOSIS — Z510 Encounter for antineoplastic radiation therapy: Secondary | ICD-10-CM | POA: Diagnosis not present

## 2016-09-15 DIAGNOSIS — Z87442 Personal history of urinary calculi: Secondary | ICD-10-CM | POA: Diagnosis not present

## 2016-09-15 DIAGNOSIS — J449 Chronic obstructive pulmonary disease, unspecified: Secondary | ICD-10-CM | POA: Insufficient documentation

## 2016-09-15 DIAGNOSIS — Z87891 Personal history of nicotine dependence: Secondary | ICD-10-CM | POA: Diagnosis not present

## 2016-09-15 DIAGNOSIS — C44229 Squamous cell carcinoma of skin of left ear and external auricular canal: Secondary | ICD-10-CM | POA: Diagnosis not present

## 2016-09-15 DIAGNOSIS — Z85828 Personal history of other malignant neoplasm of skin: Secondary | ICD-10-CM | POA: Diagnosis not present

## 2016-09-15 DIAGNOSIS — E039 Hypothyroidism, unspecified: Secondary | ICD-10-CM | POA: Diagnosis not present

## 2016-09-15 DIAGNOSIS — Z882 Allergy status to sulfonamides status: Secondary | ICD-10-CM | POA: Diagnosis not present

## 2016-09-15 DIAGNOSIS — E785 Hyperlipidemia, unspecified: Secondary | ICD-10-CM | POA: Diagnosis not present

## 2016-09-15 HISTORY — DX: Unspecified malignant neoplasm of skin, unspecified: C44.90

## 2016-09-15 NOTE — Progress Notes (Signed)
Please see the Nurse Progress Note in the MD Initial Consult Encounter for this patient. 

## 2016-09-15 NOTE — Progress Notes (Signed)
Radiation Oncology         [phone]) [phone] ________________________________  Name: [name] MRN: [id number]  Date: 09/15/2016  DOB: Sep 20, 1927  HE:NIDPOE,UMPNTIR J, MD  Izora Ribas, MD     REFERRING PHYSICIAN: Izora Ribas, MD   DIAGNOSIS: The encounter diagnosis was Squamous cell cancer of external ear, left.   HISTORY OF PRESENT ILLNESS: [name] is a 83 y.o. male seen at the request of Dr. 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  The patient has been followed by Dr. Shon Millet, of Dermatology at [facility], for the past 5 years for a history of non-melanoma skin cancer. He has had numerous skin excisions for suspicious lesions, particularly on his face. He had previous Moh's procedure with Dr. Harvel Quale in October 2017. In follow up with Dr. Shon Millet on 08/26/16, he mentioned concerns with soreness in his left ear, and was found to have scaling papules on his forehead, nose, left cheek, left leg, and left ear. Shave biopsies of the forehead, nose, and left ear were obtained at that time. Biopsy of the forehead revealed basal cell carcinoma (nodular type), biopsy of the nose revealed actinic keratosis, and biopsy of the left ear revealed invasive squamous cell carcinoma (extending to the deep margin).  The patient then presented to Dr. Harvel Quale of the [place] on 09/06/16 who performed shave biopsy of the left malar area revealing inflamed seborrheic keratosis, and mohs surgery of the left crus of anthelix revealing extensive perineural invasive by squamous cell carcinoma.  The patient and his wife present today to discuss radiation to the left ear for the management of his disease.  PREVIOUS RADIATION THERAPY: No   PAST MEDICAL HISTORY:  Past Medical History:  Diagnosis Date  . Arthritis    osteoarthritis  . Cancer ([place])    skin cancer only.-no melanoma.  Marland Kitchen COPD (chronic obstructive pulmonary disease) ([place])   . History  of kidney stones    lithotripsy x1   . History of oxygen administration    Oxygen @ 2 l/m nasally bedtime  . Hyperlipidemia   . Hypothyroidism   . Pulmonary nodule, right   . 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 Tobacco abuse    Quit 09/17/10       PAST SURGICAL HISTORY: Past Surgical History:  Procedure Laterality Date  . CATARACT EXTRACTION, BILATERAL    . CONVERSION TO TOTAL HIP Left 04/28/2015   Procedure: LEFT HIP CONVERSION PREVIOUS HIP SURGERY TO TOTAL HIP ARTHROPLASTY (ANTERIOR);  Surgeon: Paralee Cancel, MD;  Location: [facility];  Service: Orthopedics;  Laterality: Left;  . electronavigational bronchoscopy  07/2010  . HIP PINNING,CANNULATED Left 06/04/2014   Procedure: LEFT HIP REDUCTION WITH PERCUTANEOUS SCREWS;  Surgeon: Mauri Pole, MD;  Location: [place];  Service: Orthopedics;  Laterality: Left;  . TRACHEOSTOMY     after aspiration event as a 83 y/o     FAMILY HISTORY:  Family History  Problem Relation Age of Onset  . Cancer Neg Hx      SOCIAL HISTORY:  reports that he quit smoking about 5 years ago. His smoking use included Cigarettes. He has a 55.00 pack-year smoking history. He has never used smokeless tobacco. He reports that he drinks alcohol. He reports that he does not use drugs. The patient is married and lives in [place]. He is a former Actor.   ALLERGIES: Sulfonamide derivatives   MEDICATIONS:  Current  Outpatient Prescriptions  Medication Sig Dispense Refill  . albuterol (PROVENTIL HFA;VENTOLIN HFA) 108 (90 BASE) MCG/ACT inhaler Inhale 2 puffs into the lungs every 4 (four) hours as needed for wheezing or shortness of breath.    Marland Kitchen albuterol (PROVENTIL) (2.5 MG/3ML) 0.083% nebulizer solution Take 3 mLs (2.5 mg total) by nebulization every 6 (six) hours. 360 mL 12  . budesonide-formoterol (SYMBICORT) 160-4.5 MCG/ACT inhaler Inhale 2 puffs into the lungs 2 (two) times daily. 1 Inhaler 5  . guaiFENesin (MUCINEX) 600 MG 12 hr tablet  Take 2 tablets (1,200 mg total) by mouth 2 (two) times daily as needed.    Marland Kitchen levothyroxine (SYNTHROID, LEVOTHROID) 50 MCG tablet Take 50 mcg by mouth daily.    . predniSONE (DELTASONE) 5 MG tablet Take 0.5 tablets (2.5 mg total) by mouth daily with breakfast. (Patient taking differently: Take 2.5 mg by mouth every other day. ) 30 tablet 3  . simvastatin (ZOCOR) 5 MG tablet Take 5 mg by mouth at bedtime.      Marland Kitchen tiotropium (SPIRIVA HANDIHALER) 18 MCG inhalation capsule Place 1 capsule (18 mcg total) into inhaler and inhale daily. 30 capsule 5   No current facility-administered medications for this encounter.      REVIEW OF SYSTEMS: On review of systems, the patient reports that he is doing well overall. He denies any chest pain, shortness of breath, cough, fevers, chills, night sweats, unintended weight changes. He denies any bowel or bladder disturbances, and denies abdominal pain, nausea or vomiting. He reports he's healing well without drainage at the site of the incision. There is minimal bruising and erythema but no concerns with palpable warmth of the treatment. He denies any new musculoskeletal or joint aches or pains. A complete review of systems is obtained and is otherwise negative.     PHYSICAL EXAM:  Wt Readings from Last 3 Encounters:  09/15/16 182 lb 9.6 oz (82.8 kg)  07/29/16 190 lb 3.2 oz (86.3 kg)  01/30/16 186 lb 9.6 oz (84.6 kg)   Temp Readings from Last 3 Encounters:  09/15/16 97.7 F (36.5 C) (Oral)  05/02/15 98 F (36.7 C) (Oral)  04/16/15 97.7 F (36.5 C) (Oral)   BP Readings from Last 3 Encounters:  09/15/16 (!) 157/79  07/29/16 132/80  01/30/16 138/80   Pulse Readings from Last 3 Encounters:  09/15/16 97  07/29/16 82  01/30/16 90   Pain Assessment Pain Score: 0-No pain Pain Loc: Ear (left ear soreness)/10  In general this is a well appearing Caucasian male in no acute distress. He is alert and oriented x4 and appropriate throughout the examination.  HEENT reveals that the patient is normocephalic, atraumatic. EOMs are intact. PERRLA. Skin is intact with evidence of previous Moh's procedure of the left scalp, as well as previous biopsy site scars along the right forehead. Along the pinna of the left ear, it appears that he's healing well since his surgical resection. There is one suture anteriorly and about 6 sutures posteriorly. There is an eschar and granulation tissue present at the external auditory meatus. Cardiovascular exam reveals a regular rate and rhythm, no clicks rubs or murmurs are auscultated. Chest is clear to auscultation bilaterally.   ECOG = 0  0 - Asymptomatic (Fully active, able to carry on all predisease activities without restriction)  1 - Symptomatic but completely ambulatory (Restricted in physically strenuous activity but ambulatory and able to carry out work of a light or sedentary nature. For example, light housework, office work)  2 -  Symptomatic, <50% in bed during the day (Ambulatory and capable of all self care but unable to carry out any work activities. Up and about more than 50% of waking hours)  3 - Symptomatic, >50% in bed, but not bedbound (Capable of only limited self-care, confined to bed or chair 50% or more of waking hours)  4 - Bedbound (Completely disabled. Cannot carry on any self-care. Totally confined to bed or chair)  5 - Death   Eustace Pen MM, Creech RH, Tormey DC, et al. [phone]). "Toxicity and response criteria of the [facility]". [place] Oncol. 5 (6): 649-55    LABORATORY DATA:  Lab Results  Component Value Date   WBC 8.3 07/11/2015   HGB 12.6 (L) 07/11/2015   HCT 38.5 (L) 07/11/2015   MCV 90.4 07/11/2015   PLT 482.0 (H) 07/11/2015   Lab Results  Component Value Date   NA 139 07/11/2015   K 4.0 07/11/2015   CL 101 07/11/2015   CO2 30 07/11/2015   Lab Results  Component Value Date   ALT 16 07/11/2015   AST 15 07/11/2015   ALKPHOS 87 07/11/2015    BILITOT 0.3 07/11/2015      RADIOGRAPHY: No results found.     IMPRESSION/PLAN: 1. 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 The patient's case was reviewed by Dr. Lisbeth Renshaw. Dr. Lisbeth Renshaw recommends a course of radiotherapy over 4-5 weeks of electron treatment, and outlines the importance of a slower treatment due to the treatment of cartilaginous tissue. He recommends continued healing for about 3 weeks prior to considering radiation planning and subsequent treatment. We discussed the risks, benefits, short, and long term effects of radiotherapy, and the patient is interested in proceeding. Dr. Lisbeth Renshaw discusses the delivery and logistics of radiotherapy. The patient is interested in moving forward with this form of treatment. We will ask our staff to coordinate simulation the week of October 04, 2016, and proceed at that time if he's healed appropriately enough to move forward.  In a visit lasting 60 minutes, greater than 50% of the time was spent face to face discussing his surgical care, pathology, and coordinating the patient's care.   The above documentation reflects my direct findings during this shared patient visit. Please see the separate note by Dr. Lisbeth Renshaw on this date for the remainder of the patient's plan of care.    Carola Rhine, PAC  This document serves as a record of services personally performed by Shona Simpson, PA-C and Kyung Rudd, MD. It was created on their behalf by Darcus Austin, a trained medical scribe. The creation of this record is based on the scribe's personal observations and the providers' statements to them. This document has been checked and approved by the attending provider.

## 2016-09-21 NOTE — Addendum Note (Signed)
Encounter addended by: Hayden Pedro, PA-C on: 09/21/2016  8:02 AM<BR>    Actions taken: Sign clinical note

## 2016-09-21 NOTE — Progress Notes (Signed)
This is an addendum to include photos of the paitent's left ear during examination on 09/15/16   The measurement on the ruler is in standard international units and the eschar and wound bed are about 2 cm.     Carola Rhine, PAC

## 2016-10-04 ENCOUNTER — Ambulatory Visit
Admission: RE | Admit: 2016-10-04 | Discharge: 2016-10-04 | Disposition: A | Discharge status: Home / Self Care | Payer: Medicare Other | Source: Ambulatory Visit | Attending: Radiation Oncology | Admitting: Radiation Oncology

## 2016-10-04 DIAGNOSIS — Z510 Encounter for antineoplastic radiation therapy: Secondary | ICD-10-CM | POA: Diagnosis not present

## 2016-10-04 DIAGNOSIS — C44229 Squamous cell carcinoma of skin of left ear and external auricular canal: Secondary | ICD-10-CM

## 2016-10-06 ENCOUNTER — Ambulatory Visit: Payer: Medicare Other | Admitting: Radiation Oncology

## 2016-10-06 ENCOUNTER — Ambulatory Visit: Payer: Medicare Other

## 2016-10-07 ENCOUNTER — Ambulatory Visit: Payer: Medicare Other

## 2016-10-07 DIAGNOSIS — Z510 Encounter for antineoplastic radiation therapy: Secondary | ICD-10-CM | POA: Diagnosis not present

## 2016-10-08 ENCOUNTER — Ambulatory Visit: Payer: Medicare Other

## 2016-10-11 ENCOUNTER — Ambulatory Visit: Payer: Medicare Other

## 2016-10-12 ENCOUNTER — Other Ambulatory Visit: Payer: Self-pay | Admitting: Pulmonary Disease

## 2016-10-12 ENCOUNTER — Ambulatory Visit: Payer: Medicare Other

## 2016-10-13 ENCOUNTER — Ambulatory Visit
Admission: RE | Admit: 2016-10-13 | Discharge: 2016-10-13 | Disposition: A | Discharge status: Home / Self Care | Payer: Medicare Other | Source: Ambulatory Visit | Attending: Radiation Oncology | Admitting: Radiation Oncology

## 2016-10-13 ENCOUNTER — Ambulatory Visit: Payer: Medicare Other

## 2016-10-13 DIAGNOSIS — Z510 Encounter for antineoplastic radiation therapy: Secondary | ICD-10-CM | POA: Diagnosis not present

## 2016-10-14 ENCOUNTER — Ambulatory Visit: Payer: Medicare Other

## 2016-10-14 ENCOUNTER — Ambulatory Visit
Admission: RE | Admit: 2016-10-14 | Discharge: 2016-10-14 | Disposition: A | Discharge status: Home / Self Care | Payer: Medicare Other | Source: Ambulatory Visit | Attending: Radiation Oncology | Admitting: Radiation Oncology

## 2016-10-14 DIAGNOSIS — Z510 Encounter for antineoplastic radiation therapy: Secondary | ICD-10-CM | POA: Diagnosis not present

## 2016-10-15 ENCOUNTER — Ambulatory Visit
Admission: RE | Admit: 2016-10-15 | Discharge: 2016-10-15 | Disposition: A | Discharge status: Home / Self Care | Payer: Medicare Other | Source: Ambulatory Visit | Attending: Radiation Oncology | Admitting: Radiation Oncology

## 2016-10-15 ENCOUNTER — Ambulatory Visit: Payer: Medicare Other

## 2016-10-15 DIAGNOSIS — C44229 Squamous cell carcinoma of skin of left ear and external auricular canal: Secondary | ICD-10-CM | POA: Insufficient documentation

## 2016-10-15 DIAGNOSIS — Z510 Encounter for antineoplastic radiation therapy: Secondary | ICD-10-CM | POA: Diagnosis not present

## 2016-10-15 NOTE — Progress Notes (Signed)
  Radiation Oncology         [phone] ________________________________  Name: JUSIAH AGUAYO MRN: [id number]  Date: 10/04/2016  DOB: Mar 23, 1928  SIMULATION AND TREATMENT PLANNING NOTE  DIAGNOSIS:     ICD-9-CM ICD-10-CM   1. Squamous cell carcinoma, ear, left 173.22 C44.229      Site:  Left ear  NARRATIVE:  The patient was brought to the treatment machine for an electron set up/in relation .  Identity was confirmed.  All relevant records and images related to the planned course of therapy were reviewed.   Written consent to proceed with treatment was confirmed which was freely given after reviewing the details related to the planned course of therapy had been reviewed with the patient.  Then, the patient was set-up in a stable reproducible  supine position for radiation therapy.  CT images were obtained.    Then the target was outlined .   A total of 1 complex treatment devices were fabricated which relate to the designed radiation treatment fields, an en face electron field. a special port plan is requested and the patient's treatment will be prescribed to the appropriate dose   PLAN:  The patient will receive 55 Gy 22 fractions.  ________________________________   Jodelle Gross, MD, PhD

## 2016-10-18 ENCOUNTER — Ambulatory Visit: Payer: Medicare Other

## 2016-10-18 ENCOUNTER — Ambulatory Visit
Admission: RE | Admit: 2016-10-18 | Discharge: 2016-10-18 | Disposition: A | Discharge status: Home / Self Care | Payer: Medicare Other | Source: Ambulatory Visit | Attending: Radiation Oncology | Admitting: Radiation Oncology

## 2016-10-18 DIAGNOSIS — Z510 Encounter for antineoplastic radiation therapy: Secondary | ICD-10-CM | POA: Diagnosis not present

## 2016-10-19 ENCOUNTER — Ambulatory Visit: Payer: Medicare Other

## 2016-10-19 ENCOUNTER — Ambulatory Visit
Admission: RE | Admit: 2016-10-19 | Discharge: 2016-10-19 | Disposition: A | Discharge status: Home / Self Care | Payer: Medicare Other | Source: Ambulatory Visit | Attending: Radiation Oncology | Admitting: Radiation Oncology

## 2016-10-19 DIAGNOSIS — Z510 Encounter for antineoplastic radiation therapy: Secondary | ICD-10-CM | POA: Diagnosis not present

## 2016-10-20 ENCOUNTER — Ambulatory Visit: Payer: Medicare Other

## 2016-10-20 ENCOUNTER — Ambulatory Visit
Admission: RE | Admit: 2016-10-20 | Discharge: 2016-10-20 | Disposition: A | Discharge status: Home / Self Care | Payer: Medicare Other | Source: Ambulatory Visit | Attending: Radiation Oncology | Admitting: Radiation Oncology

## 2016-10-20 DIAGNOSIS — Z510 Encounter for antineoplastic radiation therapy: Secondary | ICD-10-CM | POA: Diagnosis not present

## 2016-10-21 ENCOUNTER — Ambulatory Visit
Admission: RE | Admit: 2016-10-21 | Discharge: 2016-10-21 | Disposition: A | Discharge status: Home / Self Care | Payer: Medicare Other | Source: Ambulatory Visit | Attending: Radiation Oncology | Admitting: Radiation Oncology

## 2016-10-21 ENCOUNTER — Ambulatory Visit: Payer: Medicare Other

## 2016-10-21 DIAGNOSIS — Z510 Encounter for antineoplastic radiation therapy: Secondary | ICD-10-CM | POA: Diagnosis not present

## 2016-10-22 ENCOUNTER — Ambulatory Visit: Payer: Medicare Other

## 2016-10-22 ENCOUNTER — Ambulatory Visit
Admission: RE | Admit: 2016-10-22 | Discharge: 2016-10-22 | Disposition: A | Discharge status: Home / Self Care | Payer: Medicare Other | Source: Ambulatory Visit | Attending: Radiation Oncology | Admitting: Radiation Oncology

## 2016-10-22 DIAGNOSIS — Z510 Encounter for antineoplastic radiation therapy: Secondary | ICD-10-CM | POA: Diagnosis not present

## 2016-10-25 ENCOUNTER — Ambulatory Visit: Payer: Medicare Other

## 2016-10-25 ENCOUNTER — Ambulatory Visit
Admission: RE | Admit: 2016-10-25 | Discharge: 2016-10-25 | Disposition: A | Discharge status: Home / Self Care | Payer: Medicare Other | Source: Ambulatory Visit | Attending: Radiation Oncology | Admitting: Radiation Oncology

## 2016-10-25 DIAGNOSIS — Z510 Encounter for antineoplastic radiation therapy: Secondary | ICD-10-CM | POA: Diagnosis not present

## 2016-10-25 DIAGNOSIS — C44229 Squamous cell carcinoma of skin of left ear and external auricular canal: Secondary | ICD-10-CM

## 2016-10-25 MED ORDER — BIAFINE EX EMUL
Freq: Every day | CUTANEOUS | Status: DC
  Administered 2016-10-25: 12:00:00 via TOPICAL

## 2016-10-25 NOTE — Progress Notes (Signed)
Pt education done, biafine to left ear and skin daily after radiation and at bedtime if needed, skin irrittion only side effect on consent form that was signed by the patirnt, no c/o pain, just itching, mild erythmea and crusting insie ear, applied biafine on ear,inside and back, patient verbal understanding not to apply until after radiation daily and at bedtime if needed 11:55 AM

## 2016-10-26 ENCOUNTER — Ambulatory Visit
Admission: RE | Admit: 2016-10-26 | Discharge: 2016-10-26 | Disposition: A | Discharge status: Home / Self Care | Payer: Medicare Other | Source: Ambulatory Visit | Attending: Radiation Oncology | Admitting: Radiation Oncology

## 2016-10-26 ENCOUNTER — Ambulatory Visit: Payer: Medicare Other

## 2016-10-26 DIAGNOSIS — Z510 Encounter for antineoplastic radiation therapy: Secondary | ICD-10-CM | POA: Diagnosis not present

## 2016-10-27 ENCOUNTER — Ambulatory Visit
Admission: RE | Admit: 2016-10-27 | Discharge: 2016-10-27 | Disposition: A | Discharge status: Home / Self Care | Payer: Medicare Other | Source: Ambulatory Visit | Attending: Radiation Oncology | Admitting: Radiation Oncology

## 2016-10-27 ENCOUNTER — Ambulatory Visit: Payer: Medicare Other

## 2016-10-27 DIAGNOSIS — Z510 Encounter for antineoplastic radiation therapy: Secondary | ICD-10-CM | POA: Diagnosis not present

## 2016-10-28 ENCOUNTER — Ambulatory Visit
Admission: RE | Admit: 2016-10-28 | Discharge: 2016-10-28 | Disposition: A | Discharge status: Home / Self Care | Payer: Medicare Other | Source: Ambulatory Visit | Attending: Radiation Oncology | Admitting: Radiation Oncology

## 2016-10-28 ENCOUNTER — Ambulatory Visit: Payer: Medicare Other

## 2016-10-28 DIAGNOSIS — Z510 Encounter for antineoplastic radiation therapy: Secondary | ICD-10-CM | POA: Diagnosis not present

## 2016-10-29 ENCOUNTER — Ambulatory Visit: Payer: Medicare Other

## 2016-10-29 ENCOUNTER — Ambulatory Visit
Admission: RE | Admit: 2016-10-29 | Discharge: 2016-10-29 | Disposition: A | Discharge status: Home / Self Care | Payer: Medicare Other | Source: Ambulatory Visit | Attending: Radiation Oncology | Admitting: Radiation Oncology

## 2016-10-29 DIAGNOSIS — Z510 Encounter for antineoplastic radiation therapy: Secondary | ICD-10-CM | POA: Diagnosis not present

## 2016-11-01 ENCOUNTER — Ambulatory Visit
Admission: RE | Admit: 2016-11-01 | Discharge: 2016-11-01 | Disposition: A | Discharge status: Home / Self Care | Payer: Medicare Other | Source: Ambulatory Visit | Attending: Radiation Oncology | Admitting: Radiation Oncology

## 2016-11-01 ENCOUNTER — Ambulatory Visit: Payer: Medicare Other

## 2016-11-01 DIAGNOSIS — Z510 Encounter for antineoplastic radiation therapy: Secondary | ICD-10-CM | POA: Diagnosis not present

## 2016-11-02 ENCOUNTER — Ambulatory Visit: Payer: Medicare Other

## 2016-11-02 ENCOUNTER — Ambulatory Visit
Admission: RE | Admit: 2016-11-02 | Discharge: 2016-11-02 | Disposition: A | Discharge status: Home / Self Care | Payer: Medicare Other | Source: Ambulatory Visit | Attending: Radiation Oncology | Admitting: Radiation Oncology

## 2016-11-02 DIAGNOSIS — Z510 Encounter for antineoplastic radiation therapy: Secondary | ICD-10-CM | POA: Diagnosis not present

## 2016-11-03 ENCOUNTER — Ambulatory Visit: Payer: Medicare Other

## 2016-11-03 ENCOUNTER — Ambulatory Visit
Admission: RE | Admit: 2016-11-03 | Discharge: 2016-11-03 | Disposition: A | Discharge status: Home / Self Care | Payer: Medicare Other | Source: Ambulatory Visit | Attending: Radiation Oncology | Admitting: Radiation Oncology

## 2016-11-03 DIAGNOSIS — Z510 Encounter for antineoplastic radiation therapy: Secondary | ICD-10-CM | POA: Diagnosis not present

## 2016-11-04 ENCOUNTER — Ambulatory Visit: Payer: Medicare Other

## 2016-11-04 ENCOUNTER — Ambulatory Visit
Admission: RE | Admit: 2016-11-04 | Discharge: 2016-11-04 | Disposition: A | Discharge status: Home / Self Care | Payer: Medicare Other | Source: Ambulatory Visit | Attending: Radiation Oncology | Admitting: Radiation Oncology

## 2016-11-04 DIAGNOSIS — Z510 Encounter for antineoplastic radiation therapy: Secondary | ICD-10-CM | POA: Diagnosis not present

## 2016-11-05 ENCOUNTER — Ambulatory Visit
Admission: RE | Admit: 2016-11-05 | Discharge: 2016-11-05 | Disposition: A | Discharge status: Home / Self Care | Payer: Medicare Other | Source: Ambulatory Visit | Attending: Radiation Oncology | Admitting: Radiation Oncology

## 2016-11-05 DIAGNOSIS — Z510 Encounter for antineoplastic radiation therapy: Secondary | ICD-10-CM | POA: Diagnosis not present

## 2016-11-08 ENCOUNTER — Ambulatory Visit
Admission: RE | Admit: 2016-11-08 | Discharge: 2016-11-08 | Disposition: A | Discharge status: Home / Self Care | Payer: Medicare Other | Source: Ambulatory Visit | Attending: Radiation Oncology | Admitting: Radiation Oncology

## 2016-11-08 DIAGNOSIS — Z510 Encounter for antineoplastic radiation therapy: Secondary | ICD-10-CM | POA: Diagnosis not present

## 2016-11-09 ENCOUNTER — Ambulatory Visit
Admission: RE | Admit: 2016-11-09 | Discharge: 2016-11-09 | Disposition: A | Discharge status: Home / Self Care | Payer: Medicare Other | Source: Ambulatory Visit | Attending: Radiation Oncology | Admitting: Radiation Oncology

## 2016-11-09 DIAGNOSIS — Z510 Encounter for antineoplastic radiation therapy: Secondary | ICD-10-CM | POA: Diagnosis not present

## 2016-11-10 ENCOUNTER — Ambulatory Visit
Admission: RE | Admit: 2016-11-10 | Discharge: 2016-11-10 | Disposition: A | Discharge status: Home / Self Care | Payer: Medicare Other | Source: Ambulatory Visit | Attending: Radiation Oncology | Admitting: Radiation Oncology

## 2016-11-10 DIAGNOSIS — Z510 Encounter for antineoplastic radiation therapy: Secondary | ICD-10-CM | POA: Diagnosis not present

## 2016-11-11 ENCOUNTER — Encounter: Payer: Self-pay | Admitting: Radiation Oncology

## 2016-11-11 ENCOUNTER — Ambulatory Visit
Admission: RE | Admit: 2016-11-11 | Discharge: 2016-11-11 | Disposition: A | Discharge status: Home / Self Care | Payer: Medicare Other | Source: Ambulatory Visit | Attending: Radiation Oncology | Admitting: Radiation Oncology

## 2016-11-11 DIAGNOSIS — C44229 Squamous cell carcinoma of skin of left ear and external auricular canal: Secondary | ICD-10-CM | POA: Insufficient documentation

## 2016-11-11 DIAGNOSIS — Z79899 Other long term (current) drug therapy: Secondary | ICD-10-CM | POA: Insufficient documentation

## 2016-11-11 DIAGNOSIS — Z882 Allergy status to sulfonamides status: Secondary | ICD-10-CM | POA: Insufficient documentation

## 2016-11-11 DIAGNOSIS — Z510 Encounter for antineoplastic radiation therapy: Secondary | ICD-10-CM | POA: Diagnosis not present

## 2016-11-11 NOTE — Progress Notes (Signed)
Squamous cell cancer of external ear, left.Squamous cell cancer of external ear, left

## 2016-11-11 NOTE — Progress Notes (Signed)
Buren BradsheSquamous cell cancer of external ear, left has received 22 treatment to his left ear, using Biafine cream. toLeft ear and hydrocortisone with erythema, denies pain to the left ear. Appetite is good.  Reports fatigue most of the day.  EOT instructions given to use the Biafine cream x two weeks.  One month follow up card given. Wt Readings from Last 3 Encounters:  11/11/16 181 lb 3.2 oz (82.2 kg)  09/15/16 182 lb 9.6 oz (82.8 kg)  07/29/16 190 lb 3.2 oz (86.3 kg)  BP 137/69   Pulse 79   Temp 97.7 F (36.5 C) (Oral)   Resp 18   Ht 5\' 10"  (1.778 m)   Wt 181 lb 3.2 oz (82.2 kg)   SpO2 100%   BMI 26.00 kg/m

## 2016-11-16 ENCOUNTER — Encounter: Payer: Self-pay | Admitting: Radiation Oncology

## 2016-11-16 NOTE — Progress Notes (Signed)
  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 Patient exhibited bright erythema to the right ear without pain.  Plan: The patient has completed radiation treatment. The patient will return to radiation oncology clinic for routine followup in one month. I advised them to call or return sooner if they have any questions or concerns related to their recovery or treatment.  ------------------------------------------------  Jodelle Gross, MD, PhD  This document serves as a record of services personally performed by Kyung Rudd, MD. It was created on his behalf by Bethann Humble, a trained medical scribe. The creation of this record is based on the scribe's personal observations and the provider's statements to them. This document has been checked and approved by the attending provider.

## 2016-12-05 IMAGING — DX DG CHEST 2V
3 series · 3 of 3 positions shown · non-contrast
Comparison: 06/07/2014

CLINICAL DATA: Preop left hip replacement

EXAM:
CHEST  2 VIEW

[chest pa (1 of 2)]
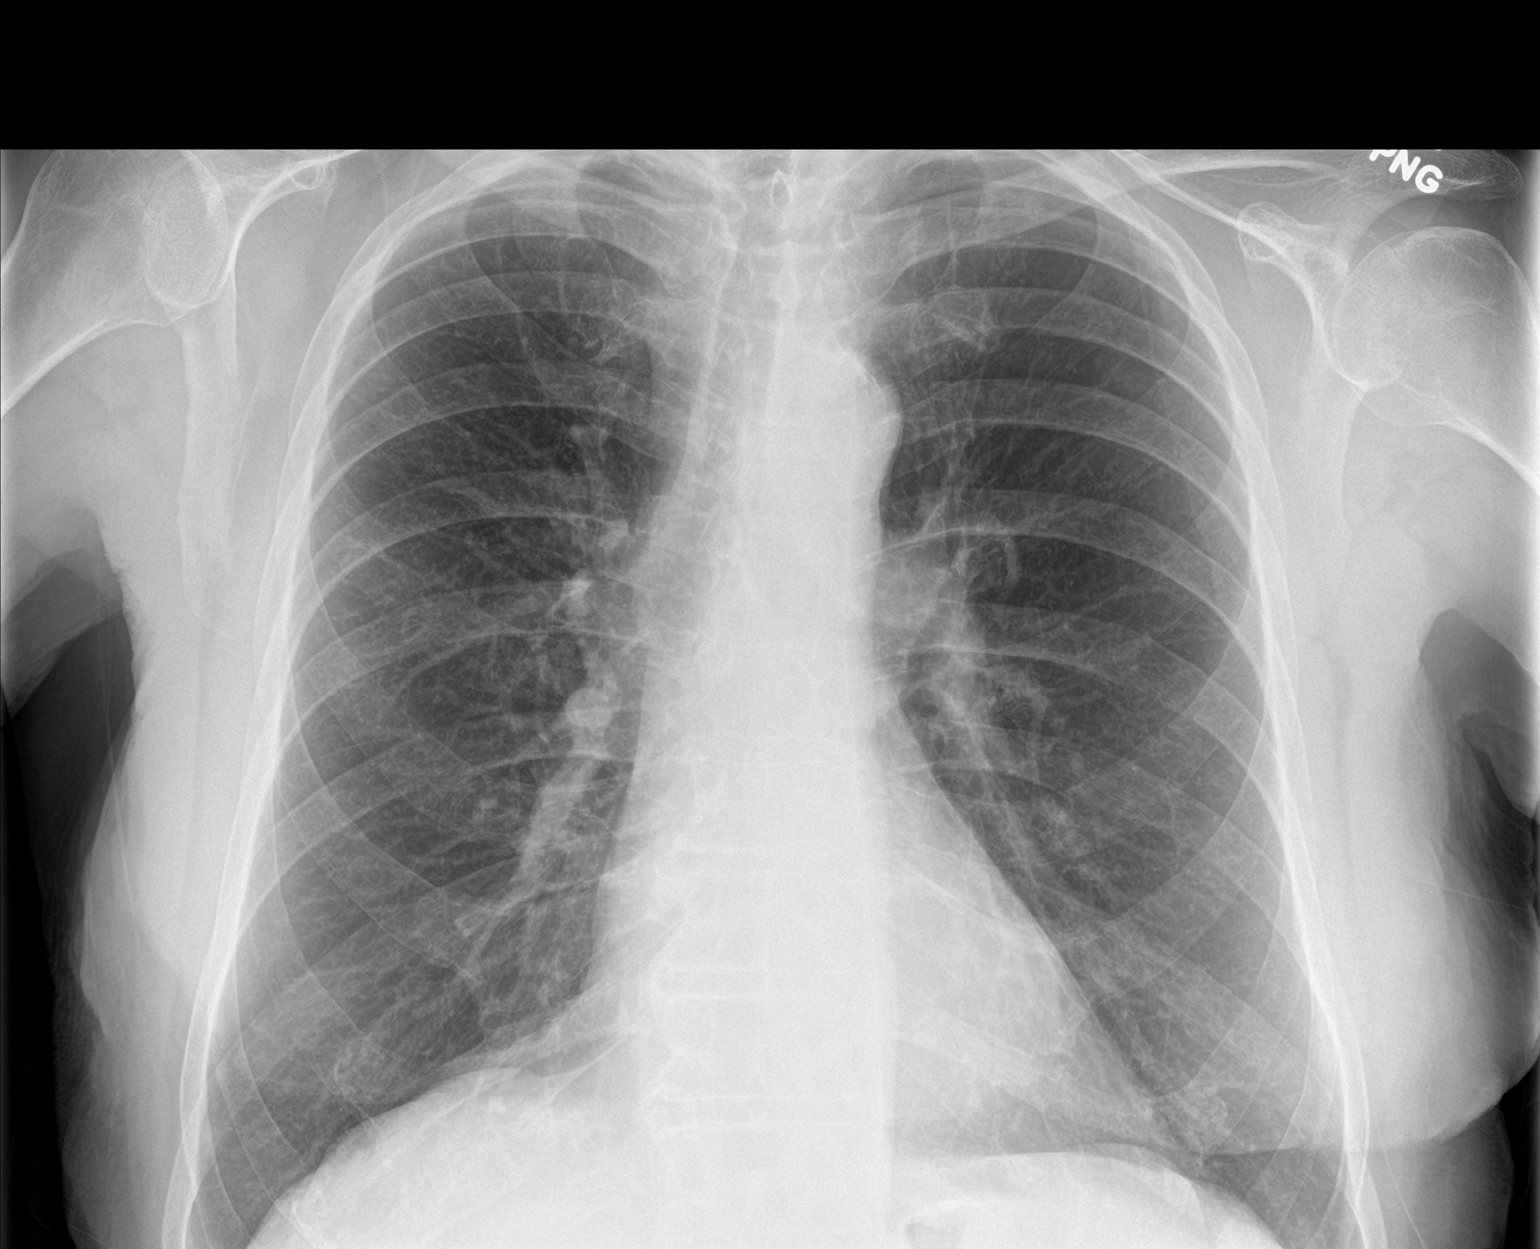

[chest lat]
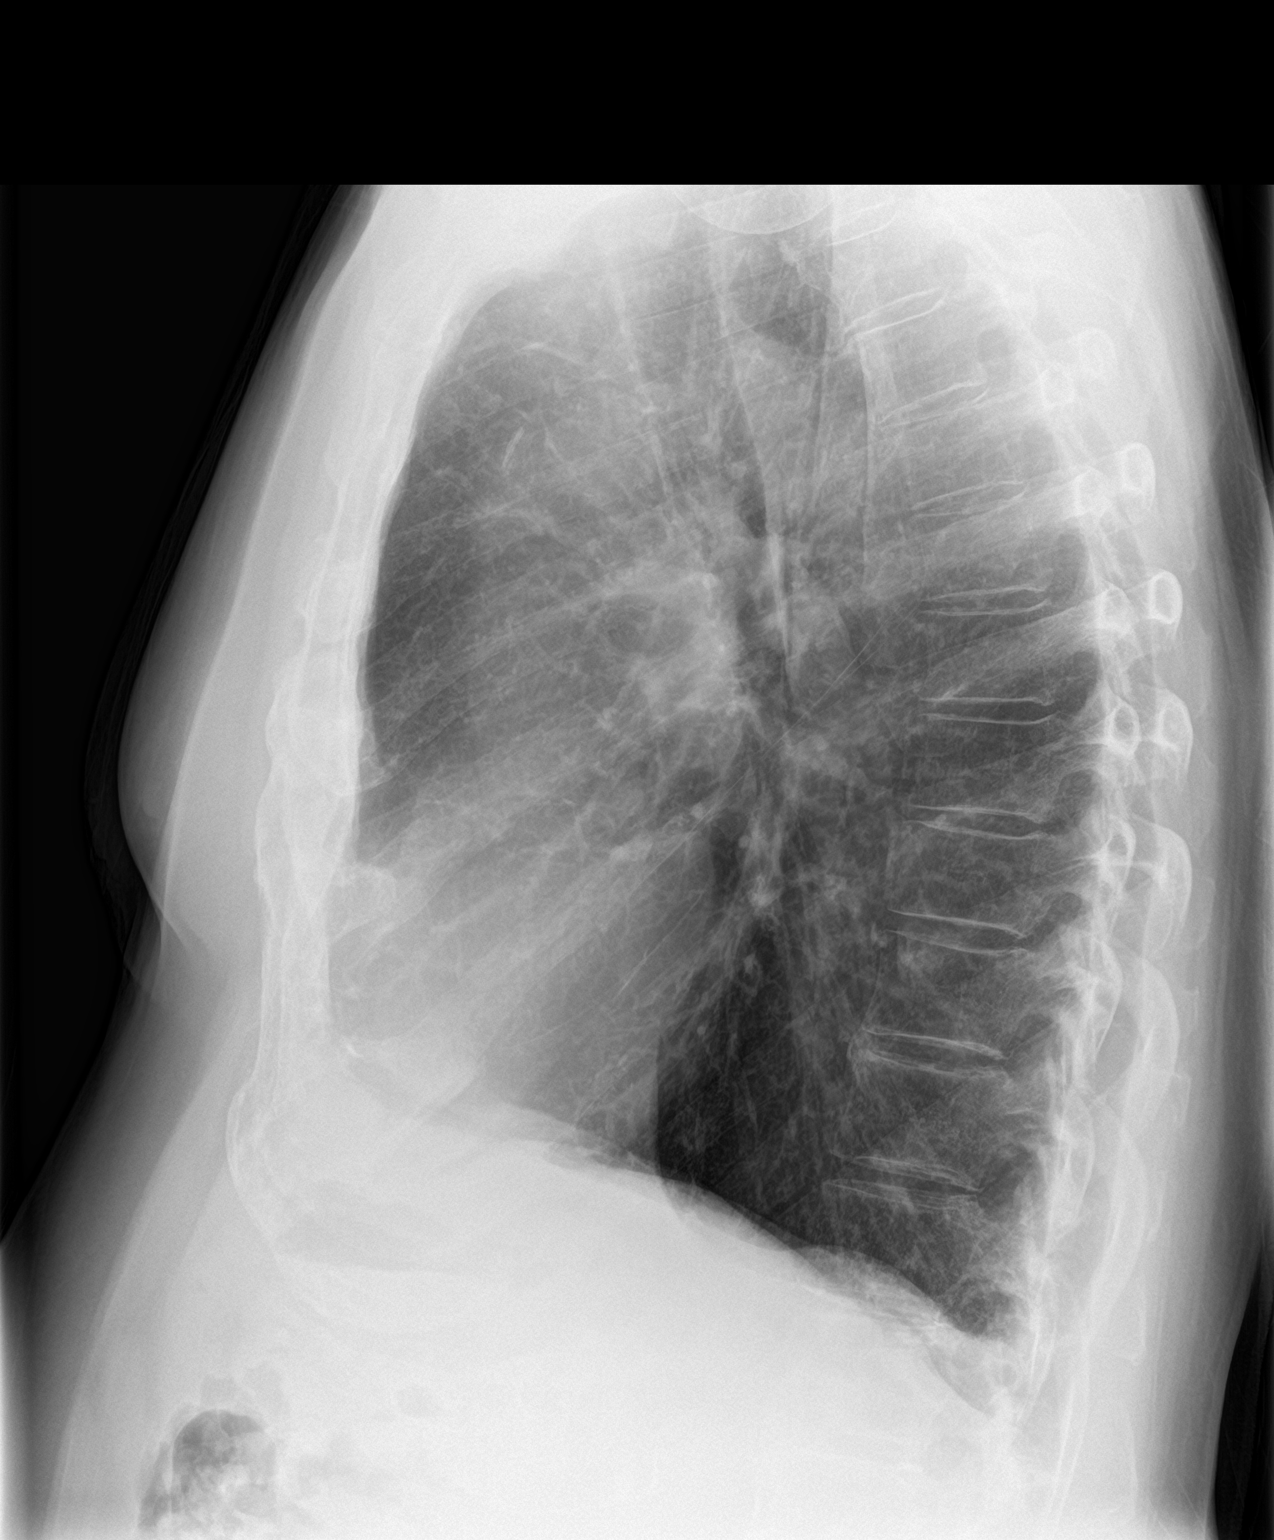

[chest pa (2 of 2)]
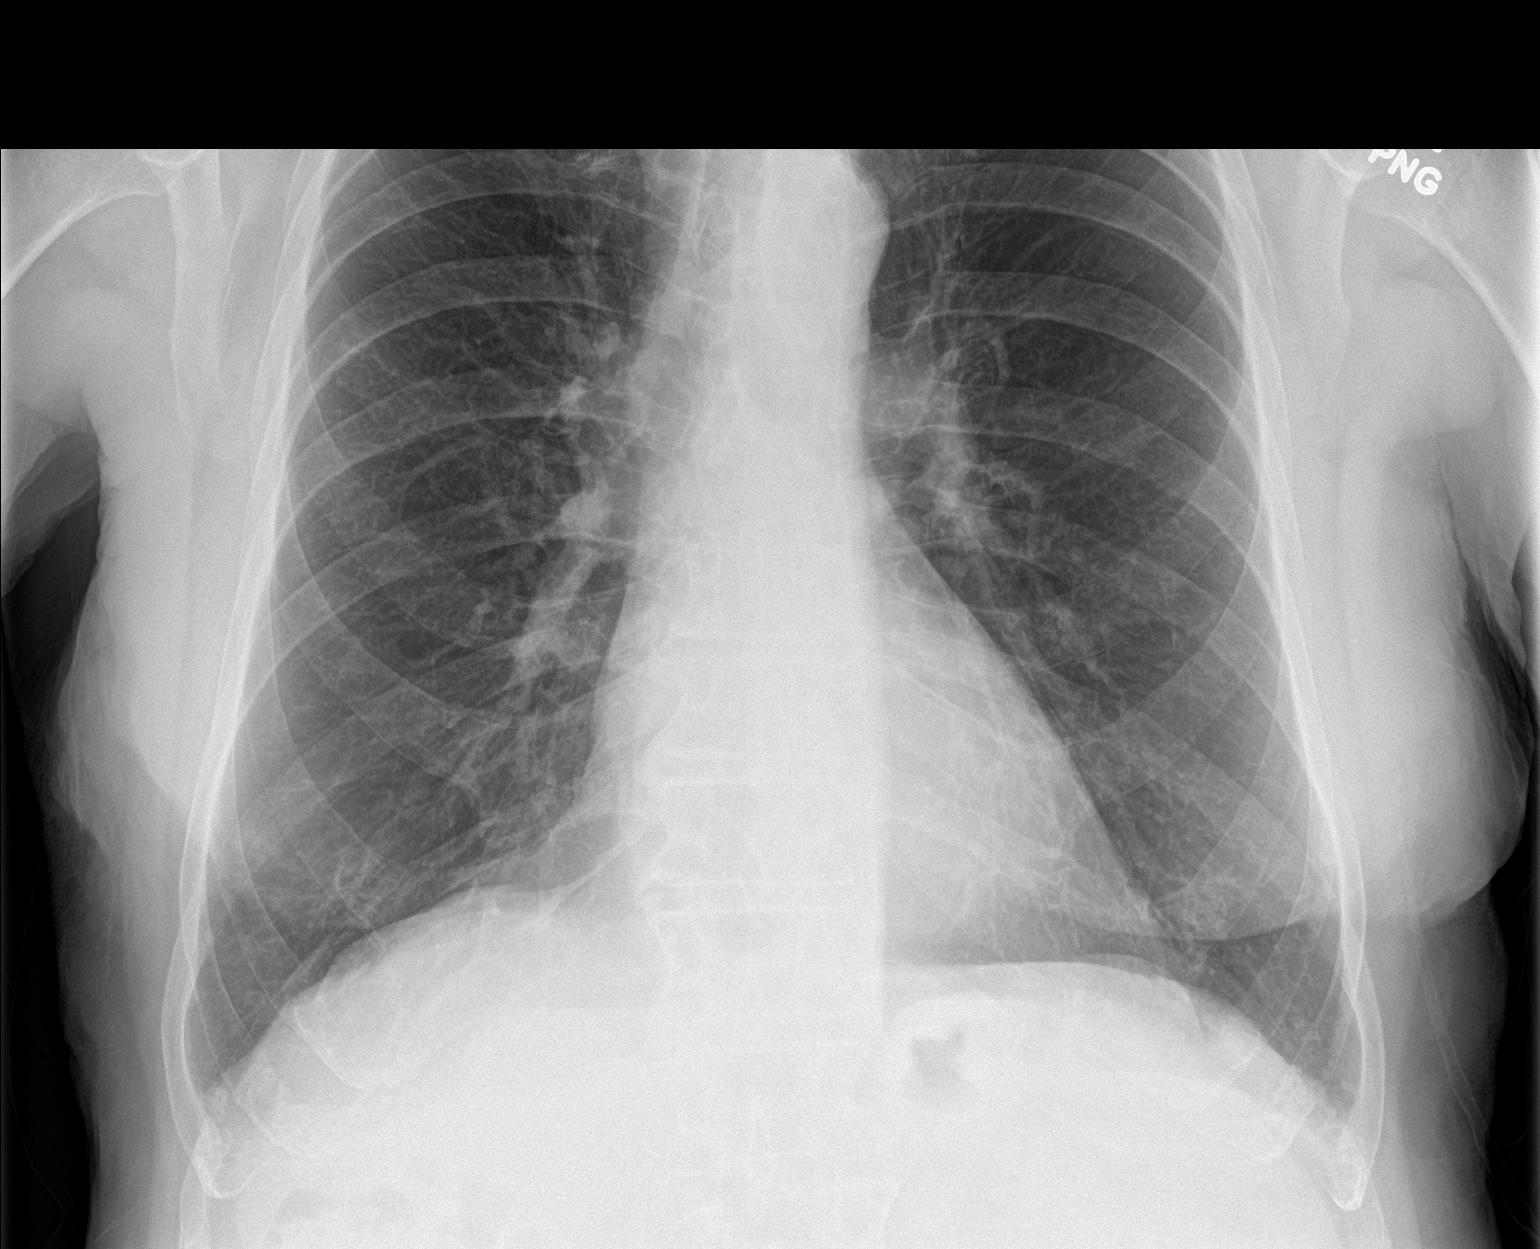

[3 of 3 positions shown; findings below may reference images not displayed]

FINDINGS: The heart size and mediastinal contours are within normal limits.
Both lungs are clear. The visualized skeletal structures are
unremarkable.
IMPRESSION: No active cardiopulmonary disease.

## 2016-12-26 IMAGING — DX DG HIP (WITH OR WITHOUT PELVIS) 1V PORT*L*
2 series · 2 of 2 positions shown · non-contrast
Comparison: None.

CLINICAL DATA: Status post left hip arthroplasty.

EXAM:
DG HIP (WITH OR WITHOUT PELVIS) 1V PORT LEFT

[pelvis ap]
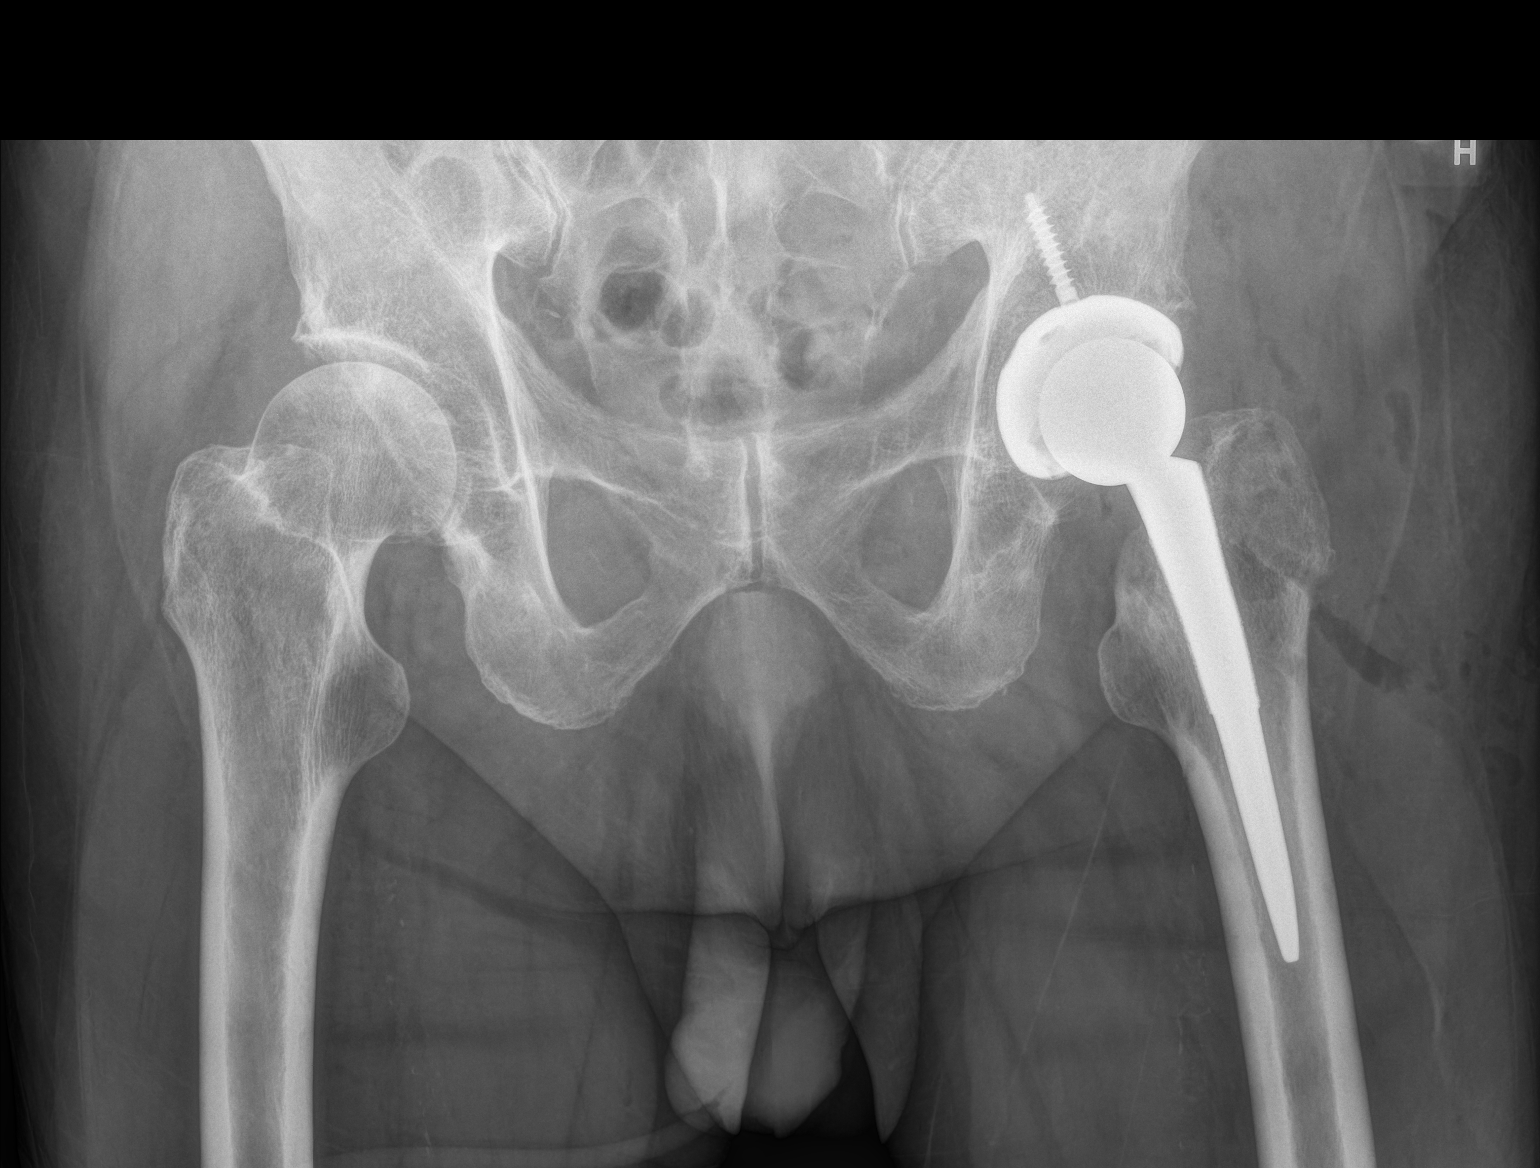

[hip x-table]
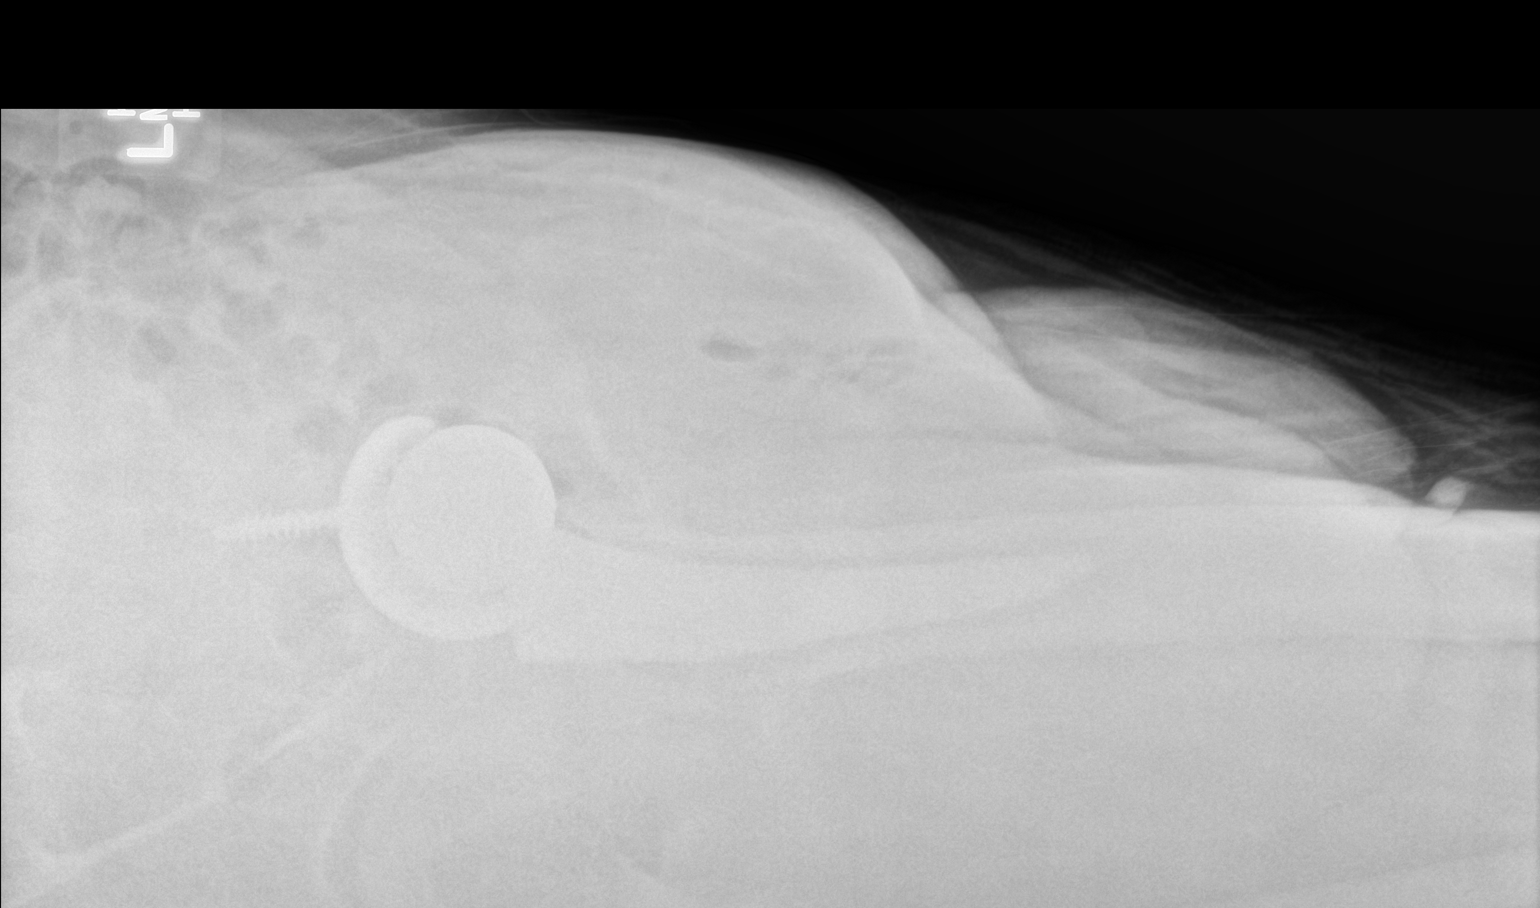

[2 of 2 positions shown; findings below may reference images not displayed]

FINDINGS: The femoral and acetabular components of the left arthroplasty are
well-seated and aligned. There is no convincing acute fracture or
operative complication.
IMPRESSION: Well aligned left hip prosthesis.

## 2017-01-05 ENCOUNTER — Encounter: Payer: Self-pay | Admitting: Radiation Oncology

## 2017-01-05 ENCOUNTER — Ambulatory Visit
Admission: RE | Admit: 2017-01-05 | Discharge: 2017-01-05 | Disposition: A | Discharge status: Home / Self Care | Payer: Medicare Other | Source: Ambulatory Visit | Attending: Radiation Oncology | Admitting: Radiation Oncology

## 2017-01-05 VITALS — BP 143/67 | HR 80 | Temp 97.8°F | Resp 18 | Ht 70.0 in | Wt 179.8 lb

## 2017-01-05 DIAGNOSIS — C44229 Squamous cell carcinoma of skin of left ear and external auricular canal: Secondary | ICD-10-CM

## 2017-01-05 DIAGNOSIS — Z79899 Other long term (current) drug therapy: Secondary | ICD-10-CM | POA: Diagnosis not present

## 2017-01-05 DIAGNOSIS — Z882 Allergy status to sulfonamides status: Secondary | ICD-10-CM | POA: Diagnosis not present

## 2017-01-05 NOTE — Addendum Note (Signed)
Encounter addended by: Malena Edman, RN on: 01/05/2017  3:18 PM<BR>    Actions taken: Charge Capture section accepted

## 2017-01-05 NOTE — Progress Notes (Signed)
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 The patient tolerated radiotherapy very well, he did develop some breakdown of the skin, and perform dressing changes with plate milligrams sterile water per his dermatologist. He appeared to heal well, and he has follow-up with Dr. Shon Millet in [place] in September 2018.                  On review of systems, the patient states he is doing very well overall. He states that his skin is healed nicely that he does have a small disruption of the skin circumferentially that is about 2 mm in diameter at the base of the pinna. He denies any concerns with his skin otherwise. No other complaints or verbalized.  ALLERGIES:  is allergic to sulfonamide derivatives.  Meds: Current Outpatient Prescriptions  Medication Sig Dispense Refill  . albuterol (PROVENTIL HFA;VENTOLIN HFA) 108 (90 BASE) MCG/ACT inhaler Inhale 2 puffs into the lungs every 4 (four) hours as needed for wheezing or shortness of breath.    Marland Kitchen albuterol (PROVENTIL) (2.5 MG/3ML) 0.083% nebulizer solution Take 3 mLs (2.5 mg total) by nebulization every 6 (six) hours. 360 mL 12  . budesonide-formoterol (SYMBICORT) 160-4.5 MCG/ACT inhaler Inhale 2 puffs into the lungs 2 (two) times daily. 1 Inhaler 5  . guaiFENesin (MUCINEX) 600 MG 12 hr tablet Take 2 tablets (1,200 mg total) by mouth 2 (two) times daily as needed.    Marland Kitchen levothyroxine (SYNTHROID, LEVOTHROID) 50 MCG tablet Take 50 mcg by mouth daily.    . predniSONE (DELTASONE) 5 MG tablet Take 0.5 tablets (2.5 mg total) by  mouth daily with breakfast. (Patient taking differently: Take 2.5 mg by mouth every other day. Taking 1/2 tablet every other day) 30 tablet 3  . predniSONE (DELTASONE) 5 MG tablet TAKE 1 TABLET (5 MG TOTAL) BY MOUTH DAILY WITH BREAKFAST. 30 tablet 3  . simvastatin (ZOCOR) 5 MG tablet Take 5 mg by mouth at bedtime.      Marland Kitchen tiotropium (SPIRIVA HANDIHALER) 18 MCG inhalation capsule Place 1 capsule (18 mcg total) into inhaler and inhale daily. 30 capsule 5   No current facility-administered medications for this encounter.     Physical Findings:  height is 5\' 10"  (1.778 m) and weight is 179 lb 12.8 oz (81.6 kg). His oral temperature is 97.8 F (36.6 C). His blood pressure is 143/67 (abnormal) and his pulse is 80. His respiration is 18 and oxygen saturation is 100%.  Pain Assessment Pain Score: 0-No pain/10 In general this is a well appearing Caucasian male in no acute distress. He's alert and oriented x4 and appropriate throughout the examination. Cardiopulmonary assessment is negative for acute distress and he exhibits normal effort. The patient's left pinna is examined. No evidence of desquamation or skin breakdown is identified. There is a small skin disruption circumferentially that is of about 2 mm at the base of the pinna.  Lab Findings: Lab Results  Component Value Date   WBC 8.3 07/11/2015   HGB 12.6 (L) 07/11/2015   HCT 38.5 (L) 07/11/2015  MCV 90.4 07/11/2015   PLT 482.0 (H) 07/11/2015     Radiographic Findings: No results found.  Impression/Plan: 1. 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 He is planning to follow up with his dermatologist in September. We will follow him as needed moving forward, and he is encouraged to call if he has questions or concerns regarding his prior treatment.       Carola Rhine, PAC

## 2017-01-28 ENCOUNTER — Encounter: Payer: Self-pay | Admitting: Pulmonary Disease

## 2017-01-28 ENCOUNTER — Ambulatory Visit (INDEPENDENT_AMBULATORY_CARE_PROVIDER_SITE_OTHER): Payer: Medicare Other | Admitting: Pulmonary Disease

## 2017-01-28 VITALS — BP 124/68 | HR 71 | Ht 70.0 in | Wt 177.6 lb

## 2017-01-28 DIAGNOSIS — J9611 Chronic respiratory failure with hypoxia: Secondary | ICD-10-CM

## 2017-01-28 DIAGNOSIS — J432 Centrilobular emphysema: Secondary | ICD-10-CM | POA: Diagnosis not present

## 2017-01-28 NOTE — Patient Instructions (Signed)
Follow up in 6 months 

## 2017-01-28 NOTE — Progress Notes (Signed)
Current Outpatient Prescriptions on File Prior to Visit  Medication Sig  . albuterol (PROVENTIL HFA;VENTOLIN HFA) 108 (90 BASE) MCG/ACT inhaler Inhale 2 puffs into the lungs every 4 (four) hours as needed for wheezing or shortness of breath.  Marland Kitchen albuterol (PROVENTIL) (2.5 MG/3ML) 0.083% nebulizer solution Take 3 mLs (2.5 mg total) by nebulization every 6 (six) hours.  . budesonide-formoterol (SYMBICORT) 160-4.5 MCG/ACT inhaler Inhale 2 puffs into the lungs 2 (two) times daily.  Marland Kitchen guaiFENesin (MUCINEX) 600 MG 12 hr tablet Take 2 tablets (1,200 mg total) by mouth 2 (two) times daily as needed.  Marland Kitchen levothyroxine (SYNTHROID, LEVOTHROID) 50 MCG tablet Take 50 mcg by mouth daily.  . predniSONE (DELTASONE) 5 MG tablet Take 0.5 tablets (2.5 mg total) by mouth daily with breakfast. (Patient taking differently: Take 2.5 mg by mouth every other day. Taking 1/2 tablet every other day)  . predniSONE (DELTASONE) 5 MG tablet TAKE 1 TABLET (5 MG TOTAL) BY MOUTH DAILY WITH BREAKFAST.  Marland Kitchen simvastatin (ZOCOR) 5 MG tablet Take 5 mg by mouth at bedtime.    Marland Kitchen tiotropium (SPIRIVA HANDIHALER) 18 MCG inhalation capsule Place 1 capsule (18 mcg total) into inhaler and inhale daily.   No current facility-administered medications on file prior to visit.     Chief Complaint  Patient presents with  . Follow-up    Pt notes recent Radiation treatment on left ear x 20 days and he states that he has had no energy since completing. Pt states that this breathing has been a little more short.     Pulmonary function testing PFT 07/14/06 >> FEV1 1.41(51%), FEV1% 60, TLC 7.3(123%), DLCO 75%, no BD  PFT 07/29/10 >> FEV1 1.30(50%), FEV1% 50, TLC 6.39(100%), DLCO 68%, no BD Spirometry 11/17/10 >> FEV1 1.15(36%), FEV1% 56 Spirometry 10/26/13 >> FEV1 0.92 (30%), FEV1% 39 Spirometry 04/07/15 >> FEV1 0.91, FEV1% 42  Pulmonary tests ONO with RA 11/30/12 >> Test time 9 hrs 30 min.  Mean SpO2 92.8%, low SpO2 86%.  Spent 3 min with SpO2 <  88% ONO with 2 liters 01/16/14 >> test time 8 hrs 36 min. Basal SpO2 93%, low SpO2 85%. Spent 4 min with SpO2 < 88%. CT angio chest 07/14/15 >> atherosclerosis, several nodules  Past medical history HLD, Hypothyroidism  Past surgical history, Family history, Social history, Allergies reviewed.  Vital signs BP 124/68 (BP Location: Left Arm, Cuff Size: Normal)   Pulse 71   Ht 5\' 10"  (1.778 m)   Wt 177 lb 9.6 oz (80.6 kg)   SpO2 98%   BMI 25.48 kg/m   History of Present Illness: [name] is a 83 y.o. 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  He had radiation tx to his Lt inner ear.  This wiped him out, and he is slowly recovering from this.  He gets winded with activity.  He goes to exercise twice per week, and checks his pulse ox.  Never goes below 90%.  He is using oxygen at night.  He gets occasional cough and clear sputum.  Denies chest pain, fever, or hemoptysis.  He has noticed tinging in his feet when he sits still.  He gets his medications through the [place].   Physical Exam:  General - pleasant Eyes - pupils reactive ENT - no sinus tenderness, no oral exudate, no LAN Cardiac - regular, no murmur Chest - no wheeze, rales Abd - soft, non tender Ext - no edema Skin - no rashes  Neuro - normal strength Psych - normal mood  Impression:  COPD with emphysema. - prednisone dependent since May 2015 - continue prednisone 5 mg daily - continue spiriva, symbicort and prn albuterol  Chronic hypoxic respiratory failure. - continue 2 liters oxygen at night   Patient Instructions  Follow up in 6 months    [name], MD [facility] Pager:  [phone] 01/28/2017, 1:59 PM

## 2017-07-19 ENCOUNTER — Other Ambulatory Visit: Payer: Self-pay | Admitting: Pulmonary Disease

## 2017-07-21 ENCOUNTER — Other Ambulatory Visit: Payer: Self-pay

## 2017-07-21 MED ORDER — ALBUTEROL SULFATE (2.5 MG/3ML) 0.083% IN NEBU: INHALATION_SOLUTION | RESPIRATORY_TRACT | 10 refills | Status: DC

## 2017-07-29 ENCOUNTER — Ambulatory Visit: Payer: Medicare Other | Admitting: Pulmonary Disease

## 2017-08-31 ENCOUNTER — Encounter: Payer: Self-pay | Admitting: Pulmonary Disease

## 2017-08-31 ENCOUNTER — Ambulatory Visit (INDEPENDENT_AMBULATORY_CARE_PROVIDER_SITE_OTHER): Payer: Medicare Other | Admitting: Pulmonary Disease

## 2017-08-31 ENCOUNTER — Telehealth: Payer: Self-pay | Admitting: Pulmonary Disease

## 2017-08-31 VITALS — BP 124/70 | HR 100 | Ht 70.0 in | Wt 179.0 lb

## 2017-08-31 DIAGNOSIS — J432 Centrilobular emphysema: Secondary | ICD-10-CM

## 2017-08-31 DIAGNOSIS — J449 Chronic obstructive pulmonary disease, unspecified: Secondary | ICD-10-CM | POA: Diagnosis not present

## 2017-08-31 NOTE — Patient Instructions (Signed)
Will arrange for referral to pulmonary rehab  Okay to change inhalers to stiolto and asmanex

## 2017-08-31 NOTE — Progress Notes (Signed)
[facility]  Chief Complaint  Patient presents with  . Follow-up    Pt has some SOB with exertion    Vital signs: BP 124/70 (BP Location: Left Arm, Cuff Size: Normal)   Pulse 100   Ht 5\' 10"  (1.778 m)   Wt 179 lb (81.2 kg)   SpO2 99%   BMI 25.68 kg/m   History of Present Illness: [name] is a 84 y.o. 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  He follows at the [facility].  He was seen by Dr. Gwenette Greet there to get his medications.  He was advised that trelegy wasn't available through the [facility], but he could get stiolto and asmanex.  He gets winded if he works outside.  He does exercise a few times per week on stationary bike.  He is not having much cough, wheeze, or sputum.  Denies fever, chest pain, hemoptysis, or leg swelling.  Physical Exam:  General - pleasant Eyes - pupils reactive ENT - no sinus tenderness, no oral exudate, no LAN Cardiac - regular, no murmur Chest - no wheeze, rales Abd - soft, non tender Ext - no edema Skin - no rashes Neuro - normal strength Psych - normal mood  Assessment/Plan:  COPD with emphysema. - prednisone dependent since May 2015 - continue prednisone 5 mg daily - okay for him to use stiolto and asmanex, and he will get this through the [facility] - will refer him back to pulmonary rehab  Chronic hypoxic respiratory failure. - continue 2 liters oxygen at night   Patient Instructions  Will arrange for referral to pulmonary rehab  Okay to change inhalers to stiolto and asmanex    [name], MD [place] 08/31/2017, 1:49 PM Pager:  [phone]  Flow Sheet  Pulmonary tests: PFT 07/14/06 >> FEV1 1.41(51%), FEV1% 60, TLC 7.3(123%), DLCO 75%, no BD  PFT 07/29/10 >> FEV1 1.30(50%), FEV1% 50, TLC 6.39(100%), DLCO 68%, no BD Spirometry 11/17/10 >> FEV1 1.15(36%), FEV1% 56 Spirometry 10/26/13 >> FEV1 0.92 (30%),  FEV1% 39 Spirometry 04/07/15 >> FEV1 0.91, FEV1% 42  Pulmonary tests ONO with RA 11/30/12 >> Test time 9 hrs 30 min.  Mean SpO2 92.8%, low SpO2 86%.  Spent 3 min with SpO2 < 88% ONO with 2 liters 01/16/14 >> test time 8 hrs 36 min. Basal SpO2 93%, low SpO2 85%. Spent 4 min with SpO2 < 88%. CT angio chest 07/14/15 >> atherosclerosis, several nodules  Past Medical History: He  has a past medical history of Arthritis, Cancer ([place]), COPD (chronic obstructive pulmonary disease) ([place]), History of kidney stones, History of oxygen administration, Hyperlipidemia, Hypothyroidism, Pulmonary nodule, right, Skin cancer (09/06/2016), and Tobacco abuse.  Past Surgical History: He  has a past surgical history that includes Tracheostomy; electronavigational bronchoscopy (07/2010); Hip pinning, cannulated (Left, 06/04/2014); Cataract extraction, bilateral; and Conversion to total hip (Left, 04/28/2015).  Family History: His family history is not on file.  Social History: He  reports that he quit smoking about 6 years ago. His smoking use included cigarettes. He has a 55.00 pack-year smoking history. he has never used smokeless tobacco. He reports that he drinks alcohol. He reports that he does not use drugs.  Medications: Allergies as of 08/31/2017      Reactions   Sulfonamide Derivatives    Unknown - as a child      Medication List        Accurate as of 08/31/17  1:49 PM. Always use your most recent med list.          albuterol 108 (90 Base) MCG/ACT inhaler Commonly known as:  PROVENTIL HFA;VENTOLIN HFA Inhale 2 puffs into the lungs every 4 (four) hours as needed for wheezing or shortness of breath.   albuterol (2.5 MG/3ML) 0.083% nebulizer solution Commonly known as:  PROVENTIL USE 1 VIAL IN NEBULIZER EVERY 6 HOURS. Generic: VENTOLIN   budesonide-formoterol 160-4.5 MCG/ACT inhaler Commonly known as:  SYMBICORT Inhale 2 puffs into the lungs 2 (two) times daily.   guaiFENesin 600 MG 12 hr  tablet Commonly known as:  MUCINEX Take 2 tablets (1,200 mg total) by mouth 2 (two) times daily as needed.   levothyroxine 50 MCG tablet Commonly known as:  SYNTHROID, LEVOTHROID Take 50 mcg by mouth daily.   predniSONE 5 MG tablet Commonly known as:  DELTASONE Take 0.5 tablets (2.5 mg total) by mouth daily with breakfast.   simvastatin 5 MG tablet Commonly known as:  ZOCOR Take 5 mg by mouth at bedtime.   tiotropium 18 MCG inhalation capsule Commonly known as:  SPIRIVA HANDIHALER Place 1 capsule (18 mcg total) into inhaler and inhale daily.

## 2017-08-31 NOTE — Telephone Encounter (Signed)
Order has been sent to Rehab

## 2017-08-31 NOTE — Telephone Encounter (Signed)
Ok order placed with different diagnosis. FYI PCC's

## 2017-09-01 ENCOUNTER — Telehealth (HOSPITAL_COMMUNITY): Payer: Self-pay

## 2017-09-01 NOTE — Telephone Encounter (Signed)
Patient returned phone call and stated he is interested in the [facility] - patient would like to attend the 1:30pm exc class. As of right now, the 1:30pm class is full. I explained to patient that once a spot becomes available we will give him a call to schedule and patient stated he understands. Went over insurance with patient and patient verbally stated that he understands what he is responsible for.

## 2017-09-01 NOTE — Telephone Encounter (Signed)
Called and spoke with wife of patient and wife stated he is interested in the program. Patient is currently out running errands right now but will call back when he returns. Will follow up if no phone call.

## 2017-09-01 NOTE — Telephone Encounter (Signed)
Patients insurance is active and benefits verified through Medicare A/B - Patient is responsible for a 20% co-insurance.   AARP is a supplement plan, will cover what Medicare does not. Covered at 100%.

## 2017-09-07 ENCOUNTER — Telehealth (HOSPITAL_COMMUNITY): Payer: Self-pay

## 2017-09-07 NOTE — Telephone Encounter (Signed)
Patient returned phone call in regards to PR - scheduled orientation on 10/03/2017 at 9:30am. Patient will attend the 1:30pm exc class. Mailed packet.

## 2017-09-20 ENCOUNTER — Ambulatory Visit (HOSPITAL_COMMUNITY)
Admission: EM | Admit: 2017-09-20 | Discharge: 2017-09-20 | Disposition: A | Discharge status: Home / Self Care | Payer: Medicare Other | Attending: Family Medicine | Admitting: Family Medicine

## 2017-09-20 ENCOUNTER — Encounter (HOSPITAL_COMMUNITY): Payer: Self-pay | Admitting: Family Medicine

## 2017-09-20 DIAGNOSIS — J441 Chronic obstructive pulmonary disease with (acute) exacerbation: Secondary | ICD-10-CM

## 2017-09-20 MED ORDER — PREDNISONE 20 MG PO TABS: ORAL_TABLET | ORAL | 0 refills | Status: DC

## 2017-09-20 MED ORDER — LEVOFLOXACIN 500 MG PO TABS: 500.0000 mg | ORAL_TABLET | Freq: Every day | ORAL | 0 refills | Status: DC

## 2017-09-20 NOTE — ED Triage Notes (Signed)
Pt here with cough and SOB; pt sts yellow sputum; pt noted labored breathing

## 2017-09-20 NOTE — ED Provider Notes (Signed)
[facility]   [id number] 09/20/17 Arrival Time: 1028   SUBJECTIVE:  [name] is a 84 y.o. male who presents to the urgent care with complaint of shortness of breath in the context of COPD.  Patient is currently relying on inhalers noted below. No current facility-administered medications on file prior to encounter.    Current Outpatient Medications on File Prior to Encounter  Medication Sig Dispense Refill  . albuterol (PROVENTIL HFA;VENTOLIN HFA) 108 (90 BASE) MCG/ACT inhaler Inhale 2 puffs into the lungs every 4 (four) hours as needed for wheezing or shortness of breath.    Marland Kitchen albuterol (PROVENTIL) (2.5 MG/3ML) 0.083% nebulizer solution USE 1 VIAL IN NEBULIZER EVERY 6 HOURS. Generic: VENTOLIN 120 vial 10  . budesonide-formoterol (SYMBICORT) 160-4.5 MCG/ACT inhaler Inhale 2 puffs into the lungs 2 (two) times daily. 1 Inhaler 5  . guaiFENesin (MUCINEX) 600 MG 12 hr tablet Take 2 tablets (1,200 mg total) by mouth 2 (two) times daily as needed.    Marland Kitchen levothyroxine (SYNTHROID, LEVOTHROID) 50 MCG tablet Take 50 mcg by mouth daily.    . predniSONE (DELTASONE) 5 MG tablet Take 0.5 tablets (2.5 mg total) by mouth daily with breakfast. (Patient taking differently: Take 2.5 mg by mouth every other day. Taking 1/2 tablet every other day) 30 tablet 3  . simvastatin (ZOCOR) 5 MG tablet Take 5 mg by mouth at bedtime.      Marland Kitchen tiotropium (SPIRIVA HANDIHALER) 18 MCG inhalation capsule Place 1 capsule (18 mcg total) into inhaler and inhale daily. 30 capsule 5   The symptoms began 2 days ago and patient is coughing colored phlegm.   Past Medical History:  Diagnosis Date  . Arthritis    osteoarthritis  . Cancer ([place])    skin cancer only.-no melanoma.  Marland Kitchen COPD (chronic obstructive pulmonary disease) ([place])   . History of kidney stones    lithotripsy x1   . History of oxygen administration    Oxygen @ 2 l/m nasally bedtime  . Hyperlipidemia   . Hypothyroidism   . Pulmonary nodule, right    . 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 Tobacco abuse    Quit 09/17/10   Family History  Problem Relation Age of Onset  . Cancer Neg Hx    Social History   Socioeconomic History  . Marital status: Married    Spouse name: Not on file  . Number of children: Not on file  . Years of education: Not on file  . Highest education level: Not on file  Occupational History  . Not on file  Social Needs  . Financial resource strain: Not on file  . Food insecurity:    Worry: Not on file    Inability: Not on file  . Transportation needs:    Medical: Not on file    Non-medical: Not on file  Tobacco Use  . Smoking status: Former Smoker    Packs/day: 1.00    Years: 55.00    Pack years: 55.00    Types: Cigarettes    Last attempt to quit: 04/22/2011    Years since quitting: 6.4  . Smokeless tobacco: Never Used  . Tobacco comment: occasionally smoked 2-3 times a week--1 cig each time  Substance and Sexual Activity  . Alcohol use: Yes    Comment: rare  social  . Drug use: No  . Sexual activity: Not on file  Lifestyle  . Physical activity:    Days per week: Not on  file    Minutes per session: Not on file  . Stress: Not on file  Relationships  . Social connections:    Talks on phone: Not on file    Gets together: Not on file    Attends religious service: Not on file    Active member of club or organization: Not on file    Attends meetings of clubs or organizations: Not on file    Relationship status: Not on file  . Intimate partner violence:    Fear of current or ex partner: Not on file    Emotionally abused: Not on file    Physically abused: Not on file    Forced sexual activity: Not on file  Other Topics Concern  . Not on file  Social History Narrative  . Not on file   No outpatient medications have been marked as taking for the 09/20/17 encounter [facility] Encounter).   Allergies  Allergen Reactions  . Sulfonamide Derivatives     Unknown - as a child       ROS: As per HPI, remainder of ROS negative.   OBJECTIVE:   Vitals:   09/20/17 1049  BP: 134/71  Pulse: 87  Resp: (!) 40  Temp: 97.9 F (36.6 C)  TempSrc: Oral  SpO2: 97%     General appearance: alert; no distress Eyes: PERRL; EOMI; conjunctiva normal HENT: normocephalic; atraumatic; TMs normal, canal normal, external ears normal without trauma; nasal mucosa normal; oral mucosa normal Neck: supple Lungs: expiratory wheezes on auscultation bilaterally Heart: regular rate and rhythm Back: no CVA tenderness Extremities: no cyanosis or edema; symmetrical with no gross deformities Skin: warm and dry Neurologic: normal gait; grossly normal Psychological: alert and cooperative; normal mood and affect      Labs:  Results for orders placed or performed in visit on 07/11/15  CBC  Result Value Ref Range   WBC 8.3 4.0 - 10.5 K/uL   RBC 4.26 4.22 - 5.81 Mil/uL   Platelets 482.0 (H) 150.0 - 400.0 K/uL   Hemoglobin 12.6 (L) 13.0 - 17.0 g/dL   HCT 38.5 (L) 39.0 - 52.0 %   MCV 90.4 78.0 - 100.0 fl   MCHC 32.7 30.0 - 36.0 g/dL   RDW 13.2 11.5 - 15.5 %  Comp Met (CMET)  Result Value Ref Range   Sodium 139 135 - 145 mEq/L   Potassium 4.0 3.5 - 5.1 mEq/L   Chloride 101 96 - 112 mEq/L   CO2 30 19 - 32 mEq/L   Glucose, Bld 93 70 - 99 mg/dL   BUN 16 6 - 23 mg/dL   Creatinine, Ser 1.10 0.40 - 1.50 mg/dL   Total Bilirubin 0.3 0.2 - 1.2 mg/dL   Alkaline Phosphatase 87 39 - 117 U/L   AST 15 0 - 37 U/L   ALT 16 0 - 53 U/L   Total Protein 7.7 6.0 - 8.3 g/dL   Albumin 3.8 3.5 - 5.2 g/dL   Calcium 9.3 8.4 - 10.5 mg/dL   GFR 67.55 >60.00 mL/min  D-Dimer, Quantitative  Result Value Ref Range   D-Dimer, Quant 1.44 (H) 0.00 - 0.48 ug/mL-FEU    Labs Reviewed - No data to display  No results found.     ASSESSMENT & PLAN:  1. COPD exacerbation ([place])     Meds ordered this encounter  Medications  . predniSONE (DELTASONE) 20 MG tablet    Sig: Two daily with food     Dispense:  10 tablet  Refill:  0  . levofloxacin (LEVAQUIN) 500 MG tablet    Sig: Take 1 tablet (500 mg total) by mouth daily.    Dispense:  7 tablet    Refill:  0    Reviewed expectations re: course of current medical issues. Questions answered. Outlined signs and symptoms indicating need for more acute intervention. Patient verbalized understanding. After Visit Summary given.     Robyn Haber, MD 09/20/17 1101

## 2017-10-03 ENCOUNTER — Encounter (HOSPITAL_COMMUNITY): Payer: Self-pay

## 2017-10-03 ENCOUNTER — Encounter (HOSPITAL_COMMUNITY)
Admission: RE | Admit: 2017-10-03 | Discharge: 2017-10-03 | Disposition: A | Discharge status: Home / Self Care | Payer: No Typology Code available for payment source | Source: Ambulatory Visit | Attending: Pulmonary Disease | Admitting: Pulmonary Disease

## 2017-10-03 VITALS — BP 175/60 | HR 86 | Resp 18 | Ht 68.0 in | Wt 174.2 lb

## 2017-10-03 DIAGNOSIS — Z87891 Personal history of nicotine dependence: Secondary | ICD-10-CM | POA: Insufficient documentation

## 2017-10-03 DIAGNOSIS — J432 Centrilobular emphysema: Secondary | ICD-10-CM | POA: Insufficient documentation

## 2017-10-03 DIAGNOSIS — Z85828 Personal history of other malignant neoplasm of skin: Secondary | ICD-10-CM | POA: Insufficient documentation

## 2017-10-03 DIAGNOSIS — Z79899 Other long term (current) drug therapy: Secondary | ICD-10-CM | POA: Insufficient documentation

## 2017-10-03 DIAGNOSIS — E785 Hyperlipidemia, unspecified: Secondary | ICD-10-CM | POA: Diagnosis not present

## 2017-10-03 NOTE — Progress Notes (Signed)
[name] 84 y.o. male Pulmonary Rehab Orientation Note Patient arrived today in [facility] for orientation to Pulmonary Rehab. He was transported from [employer] via wheel chair. He does not carry portable oxygen. Per pt, he uses oxygen each night at 2 LPM. Color good, skin warm and dry. Patient is oriented to time and place. Patient's medical history, psychosocial health, and medications reviewed. Psychosocial assessment reveals pt lives with their spouse. Pt is currently retired. Pt hobbies include reading. He is currently reading a WW2 series that he is really enjoying. Pt reports his stress level is low. Areas of stress/anxiety include Health. This is only since he had an exacerbation and "slight pneumonia" that was diagnosed a month ago. He states he has just not "bounced back" as he thought he should and has not been to the gym in over a month. Pt does not exhibit signs of depression. PHQ2/9 score 0/na. Pt shows good  coping skills with positive outlook. He is offered emotional support and reassurance. Will continue to monitor and evaluate progress toward psychosocial goal(s) of remaining positive about his ability to return to his physical activities and physical baseline. Physical assessment reveals heart rate is normal, breath sounds diminished. Grip strength equal, strong. Distal pulses palpable. Patient reports he does take medications as prescribed. Patient states he follows a Regular diet. The patient reports no specific efforts to gain or lose weight.. Patient's weight will be monitored closely. Demonstration and practice of PLB using pulse oximeter. Patient able to return demonstration satisfactorily. Safety and hand hygiene in the exercise area reviewed with patient. Patient voices understanding of the information reviewed. Department expectations discussed with patient and achievable goals were set. The patient shows enthusiasm about attending the program and we look  forward to working with this nice gentleman. The patient is scheduled for a 6 min walk test on 10/04/17 and to begin exercise on 10/11/17.   45 minutes was spent on a variety of activities such as assessment of the patient, obtaining baseline data including height, weight, BMI, and grip strength, verifying medical history, allergies, and current medications, and teaching patient strategies for performing tasks with less respiratory effort with emphasis on pursed lip breathing.

## 2017-10-04 ENCOUNTER — Encounter (HOSPITAL_COMMUNITY)
Admission: RE | Admit: 2017-10-04 | Discharge: 2017-10-04 | Disposition: A | Discharge status: Home / Self Care | Payer: No Typology Code available for payment source | Source: Ambulatory Visit | Attending: Pulmonary Disease | Admitting: Pulmonary Disease

## 2017-10-04 DIAGNOSIS — J432 Centrilobular emphysema: Secondary | ICD-10-CM

## 2017-10-06 ENCOUNTER — Encounter (HOSPITAL_COMMUNITY): Payer: Self-pay | Admitting: *Deleted

## 2017-10-06 NOTE — Progress Notes (Signed)
Pulmonary Individual Treatment Plan  Patient Details  Name: AIRON [name] MRN: [id number] Date of Birth: 03/08/1928 Referring Provider:     Pulmonary Rehab Walk Test from 10/04/2017 in [place]  Referring Provider  Dr. Halford Chessman      Initial Encounter Date:    Pulmonary Rehab Walk Test from 10/04/2017 in [place]  Date  10/04/17  Referring Provider  Dr. Halford Chessman      Visit Diagnosis: Centrilobular emphysema ([place])  Patient's Home Medications on Admission:   Current Outpatient Medications:  .  albuterol (PROVENTIL HFA;VENTOLIN HFA) 108 (90 BASE) MCG/ACT inhaler, Inhale 2 puffs into the lungs every 4 (four) hours as needed for wheezing or shortness of breath., Disp: , Rfl:  .  albuterol (PROVENTIL) (2.5 MG/3ML) 0.083% nebulizer solution, USE 1 VIAL IN NEBULIZER EVERY 6 HOURS. Generic: VENTOLIN, Disp: 120 vial, Rfl: 10 .  guaiFENesin (MUCINEX) 600 MG 12 hr tablet, Take 2 tablets (1,200 mg total) by mouth 2 (two) times daily as needed. (Patient not taking: Reported on 10/03/2017), Disp: , Rfl:  .  levothyroxine (SYNTHROID, LEVOTHROID) 50 MCG tablet, Take 50 mcg by mouth daily., Disp: , Rfl:  .  predniSONE (DELTASONE) 5 MG tablet, Take 0.5 tablets (2.5 mg total) by mouth daily with breakfast. (Patient taking differently: Take 2.5 mg by mouth every other day. Taking 1/2 tablet every other day), Disp: 30 tablet, Rfl: 3 .  simvastatin (ZOCOR) 5 MG tablet, Take 5 mg by mouth at bedtime.  , Disp: , Rfl:  .  triamcinolone cream (KENALOG) 0.1 %, Apply topically., Disp: , Rfl:   Past Medical History: Past Medical History:  Diagnosis Date  . Arthritis    osteoarthritis  . Cancer ([place])    skin cancer only.-no melanoma.  Marland Kitchen COPD (chronic obstructive pulmonary disease) ([place])   . History of kidney stones    lithotripsy x1   . History of oxygen administration    Oxygen @ 2 l/m nasally bedtime  . Hyperlipidemia   . Hypothyroidism   .  Pulmonary nodule, right   . 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 Tobacco abuse    Quit 09/17/10    Tobacco Use: Social History   Tobacco Use  Smoking Status Former Smoker  . Packs/day: 1.00  . Years: 55.00  . Pack years: 55.00  . Types: Cigarettes  . Last attempt to quit: 04/22/2011  . Years since quitting: 6.4  Smokeless Tobacco Never Used  Tobacco Comment   occasionally smoked 2-3 times a week--1 cig each time    Labs: Recent Review Flowsheet Data    Labs for ITP Cardiac and Pulmonary Rehab Latest Ref Rng & Units 12/07/2006 12/08/2006 12/11/2006 06/03/2014   Cholestrol - - 114 ATP III CLASSIFICATION: <200     mg/dL   Desirable 200-239  mg/dL   Borderline High >=240    mg/dL   High - -   LDLCALC - - 75 Total Cholesterol/HDL:CHD Risk Coronary Heart Disease Risk Table Men   Women 1/2 Average Risk   3.4   3.3 - -   HDL - - 29(L) - -   Trlycerides - - 48 - -   Hemoglobin A1c <5.7 % - - - 6.5(H)   PHART - - 7.433 7.430 -   PCO2ART - - 32.9(L) 38.0 -   HCO3 - 21.1 21.6 24.8(H) -   TCO2 - 22 22.6 25.9 -   ACIDBASEDEF - 2.0 2.0 - -  O2SAT - - 93.8 94.5 -      Capillary Blood Glucose: No results found for: GLUCAP   Pulmonary Assessment Scores: Pulmonary Assessment Scores    Row Name 10/06/17 1310         ADL UCSD   ADL Phase  Entry       mMRC Score   mMRC Score  3        Pulmonary Function Assessment: Pulmonary Function Assessment - 10/03/17 1037      Breath   Bilateral Breath Sounds  Decreased    Shortness of Breath  Limiting activity;Yes       Exercise Target Goals: Date: 10/04/17  Exercise Program Goal: Individual exercise prescription set using results from initial 6 min walk test and THRR while considering  patient's activity barriers and safety.    Exercise Prescription Goal: Initial exercise prescription builds to 30-45 minutes a day of aerobic activity, 2-3 days per week.  Home exercise guidelines will be given to patient  during program as part of exercise prescription that the participant will acknowledge.  Activity Barriers & Risk Stratification: Activity Barriers & Cardiac Risk Stratification - 10/03/17 1027      Activity Barriers & Cardiac Risk Stratification   Activity Barriers  Left Hip Replacement;Deconditioning;Shortness of Breath       6 Minute Walk: 6 Minute Walk    Row Name 10/06/17 1308         6 Minute Walk   Phase  Initial     Distance  800 feet     Walk Time  6 minutes     # of Rest Breaks  - 2 (50 seconds total)     MPH  1.5     METS  2.15     RPE  13     Perceived Dyspnea   3     Symptoms  No     Resting HR  97 bpm     Resting BP  133/77     Resting Oxygen Saturation   97 %     Exercise Oxygen Saturation  during 6 min walk  94 %     Max Ex. HR  125 bpm     Max Ex. BP  164/76       Interval HR   1 Minute HR  107     2 Minute HR  125     3 Minute HR  116     4 Minute HR  117     5 Minute HR  120     6 Minute HR  120     2 Minute Post HR  114     Interval Heart Rate?  Yes       Interval Oxygen   Interval Oxygen?  Yes     Baseline Oxygen Saturation %  97 %     1 Minute Oxygen Saturation %  97 %     1 Minute Liters of Oxygen  0 L     2 Minute Oxygen Saturation %  95 %     2 Minute Liters of Oxygen  0 L     3 Minute Oxygen Saturation %  96 %     3 Minute Liters of Oxygen  0 L     4 Minute Oxygen Saturation %  95 %     4 Minute Liters of Oxygen  0 L     5 Minute Oxygen Saturation %  94 %  5 Minute Liters of Oxygen  0 L     6 Minute Oxygen Saturation %  94 %     6 Minute Liters of Oxygen  0 L     2 Minute Post Oxygen Saturation %  98 %     2 Minute Post Liters of Oxygen  0 L        Oxygen Initial Assessment: Oxygen Initial Assessment - 10/06/17 1310      Initial 6 min Walk   Oxygen Used  None      Program Oxygen Prescription   Program Oxygen Prescription  None       Oxygen Re-Evaluation:   Oxygen Discharge (Final Oxygen  Re-Evaluation):   Initial Exercise Prescription: Initial Exercise Prescription - 10/06/17 1300      Date of Initial Exercise RX and Referring Provider   Date  10/04/17    Referring Provider  Dr. Halford Chessman      Treadmill   MPH  1    Grade  0    Minutes  17      Bike   Level  0.4    Minutes  17      NuStep   Level  2    SPM  80    Minutes  17    METs  1.5      Prescription Details   Frequency (times per week)  2    Duration  Progress to 45 minutes of aerobic exercise without signs/symptoms of physical distress      Intensity   THRR 40-80% of Max Heartrate  53-106    Ratings of Perceived Exertion  11-13    Perceived Dyspnea  0-4      Progression   Progression  Continue progressive overload as per policy without signs/symptoms or physical distress.      Resistance Training   Training Prescription  Yes    Weight  orange    Reps  10-15       Perform Capillary Blood Glucose checks as needed.  Exercise Prescription Changes:   Exercise Comments:   Exercise Goals and Review: Exercise Goals    Row Name 10/03/17 1028             Exercise Goals   Increase Physical Activity  Yes       Intervention  Provide advice, education, support and counseling about physical activity/exercise needs.;Develop an individualized exercise prescription for aerobic and resistive training based on initial evaluation findings, risk stratification, comorbidities and participant's personal goals.       Expected Outcomes  Short Term: Attend rehab on a regular basis to increase amount of physical activity.;Long Term: Exercising regularly at least 3-5 days a week.;Long Term: Add in home exercise to make exercise part of routine and to increase amount of physical activity.       Increase Strength and Stamina  Yes       Intervention  Provide advice, education, support and counseling about physical activity/exercise needs.;Develop an individualized exercise prescription for aerobic and resistive  training based on initial evaluation findings, risk stratification, comorbidities and participant's personal goals.       Expected Outcomes  Short Term: Increase workloads from initial exercise prescription for resistance, speed, and METs.;Short Term: Perform resistance training exercises routinely during rehab and add in resistance training at home;Long Term: Improve cardiorespiratory fitness, muscular endurance and strength as measured by increased METs and functional capacity (6MWT)       Able to understand and use rate of  perceived exertion (RPE) scale  Yes       Intervention  Provide education and explanation on how to use RPE scale       Expected Outcomes  Short Term: Able to use RPE daily in rehab to express subjective intensity level;Long Term:  Able to use RPE to guide intensity level when exercising independently       Able to understand and use Dyspnea scale  Yes       Intervention  Provide education and explanation on how to use Dyspnea scale       Expected Outcomes  Short Term: Able to use Dyspnea scale daily in rehab to express subjective sense of shortness of breath during exertion;Long Term: Able to use Dyspnea scale to guide intensity level when exercising independently       Knowledge and understanding of Target Heart Rate Range (THRR)  Yes       Intervention  Provide education and explanation of THRR including how the numbers were predicted and where they are located for reference       Expected Outcomes  Short Term: Able to state/look up THRR;Short Term: Able to use daily as guideline for intensity in rehab;Long Term: Able to use THRR to govern intensity when exercising independently       Understanding of Exercise Prescription  Yes       Intervention  Provide education, explanation, and written materials on patient's individual exercise prescription       Expected Outcomes  Short Term: Able to explain program exercise prescription;Long Term: Able to explain home exercise  prescription to exercise independently          Exercise Goals Re-Evaluation :   Discharge Exercise Prescription (Final Exercise Prescription Changes):   Nutrition:  Target Goals: Understanding of nutrition guidelines, daily intake of sodium 1500mg , cholesterol 200mg , calories 30% from fat and 7% or less from saturated fats, daily to have 5 or more servings of fruits and vegetables.  Biometrics: Pre Biometrics - 10/03/17 1039      Pre Biometrics   Grip Strength  38 kg        Nutrition Therapy Plan and Nutrition Goals:   Nutrition Assessments:   Nutrition Goals Re-Evaluation:   Nutrition Goals Discharge (Final Nutrition Goals Re-Evaluation):   Psychosocial: Target Goals: Acknowledge presence or absence of significant depression and/or stress, maximize coping skills, provide positive support system. Participant is able to verbalize types and ability to use techniques and skills needed for reducing stress and depression.  Initial Review & Psychosocial Screening: Initial Psych Review & Screening - 10/03/17 1037      Initial Review   Current issues with  None Identified      Family Dynamics   Good Support System?  Yes      Barriers   Psychosocial barriers to participate in program  There are no identifiable barriers or psychosocial needs.      Screening Interventions   Interventions  Encouraged to exercise       Quality of Life Scores:  Scores of 19 and below usually indicate a poorer quality of life in these areas.  A difference of  2-3 points is a clinically meaningful difference.  A difference of 2-3 points in the total score of the Quality of Life Index has been associated with significant improvement in overall quality of life, self-image, physical symptoms, and general health in studies assessing change in quality of life.   PHQ-9: Recent Review Flowsheet Data    Depression screen  [facility] 2/9 10/03/2017 01/05/2017   Decreased Interest 0 0   Down, Depressed,  Hopeless 0 0   PHQ - 2 Score 0 0     Interpretation of Total Score  Total Score Depression Severity:  1-4 = Minimal depression, 5-9 = Mild depression, 10-14 = Moderate depression, 15-19 = Moderately severe depression, 20-27 = Severe depression   Psychosocial Evaluation and Intervention: Psychosocial Evaluation - 10/03/17 1037      Psychosocial Evaluation & Interventions   Interventions  Encouraged to exercise with the program and follow exercise prescription    Expected Outcomes  patient will remain free from psychosocial barriers to participation in pulmonary rehab.    Continue Psychosocial Services   Follow up required by staff       Psychosocial Re-Evaluation:   Psychosocial Discharge (Final Psychosocial Re-Evaluation):   Education: Education Goals: Education classes will be provided on a weekly basis, covering required topics. Participant will state understanding/return demonstration of topics presented.  Learning Barriers/Preferences: Learning Barriers/Preferences - 10/03/17 1036      Learning Barriers/Preferences   Learning Barriers  None    Learning Preferences  Written Material       Education Topics: Risk Factor Reduction:  -Group instruction that is supported by a PowerPoint presentation. Instructor discusses the definition of a risk factor, different risk factors for pulmonary disease, and how the heart and lungs work together.     Nutrition for Pulmonary Patient:  -Group instruction provided by PowerPoint slides, verbal discussion, and written materials to support subject matter. The instructor gives an explanation and review of healthy diet recommendations, which includes a discussion on weight management, recommendations for fruit and vegetable consumption, as well as protein, fluid, caffeine, fiber, sodium, sugar, and alcohol. Tips for eating when patients are short of breath are discussed.   Pursed Lip Breathing:  -Group instruction that is supported by  demonstration and informational handouts. Instructor discusses the benefits of pursed lip and diaphragmatic breathing and detailed demonstration on how to preform both.     Oxygen Safety:  -Group instruction provided by PowerPoint, verbal discussion, and written material to support subject matter. There is an overview of "What is Oxygen" and "Why do we need it".  Instructor also reviews how to create a safe environment for oxygen use, the importance of using oxygen as prescribed, and the risks of noncompliance. There is a brief discussion on traveling with oxygen and resources the patient may utilize.   Oxygen Equipment:  -Group instruction provided by [facility] Staff utilizing handouts, written materials, and equipment demonstrations.   Signs and Symptoms:  -Group instruction provided by written material and verbal discussion to support subject matter. Warning signs and symptoms of infection, stroke, and heart attack are reviewed and when to call the physician/911 reinforced. Tips for preventing the spread of infection discussed.   Advanced Directives:  -Group instruction provided by verbal instruction and written material to support subject matter. Instructor reviews Advanced Directive laws and proper instruction for filling out document.   Pulmonary Video:  -Group video education that reviews the importance of medication and oxygen compliance, exercise, good nutrition, pulmonary hygiene, and pursed lip and diaphragmatic breathing for the pulmonary patient.   Exercise for the Pulmonary Patient:  -Group instruction that is supported by a PowerPoint presentation. Instructor discusses benefits of exercise, core components of exercise, frequency, duration, and intensity of an exercise routine, importance of utilizing pulse oximetry during exercise, safety while exercising, and options of places to exercise outside of rehab.  Pulmonary Medications:  -Verbally interactive group education  provided by instructor with focus on inhaled medications and proper administration.   Anatomy and Physiology of the Respiratory System and Intimacy:  -Group instruction provided by PowerPoint, verbal discussion, and written material to support subject matter. Instructor reviews respiratory cycle and anatomical components of the respiratory system and their functions. Instructor also reviews differences in obstructive and restrictive respiratory diseases with examples of each. Intimacy, Sex, and Sexuality differences are reviewed with a discussion on how relationships can change when diagnosed with pulmonary disease. Common sexual concerns are reviewed.   MD DAY -A group question and answer session with a medical doctor that allows participants to ask questions that relate to their pulmonary disease state.   OTHER EDUCATION -Group or individual verbal, written, or video instructions that support the educational goals of the pulmonary rehab program.   Holiday Eating Survival Tips:  -Group instruction provided by PowerPoint slides, verbal discussion, and written materials to support subject matter. The instructor gives patients tips, tricks, and techniques to help them not only survive but enjoy the holidays despite the onslaught of food that accompanies the holidays.   Knowledge Questionnaire Score: Knowledge Questionnaire Score - 10/03/17 1036      Knowledge Questionnaire Score   Pre Score  15/18       Core Components/Risk Factors/Patient Goals at Admission: Personal Goals and Risk Factors at Admission - 10/03/17 1037      Core Components/Risk Factors/Patient Goals on Admission   Improve shortness of breath with ADL's  Yes    Intervention  Provide education, individualized exercise plan and daily activity instruction to help decrease symptoms of SOB with activities of daily living.    Expected Outcomes  Short Term: Improve cardiorespiratory fitness to achieve a reduction of symptoms  when performing ADLs;Long Term: Be able to perform more ADLs without symptoms or delay the onset of symptoms       Core Components/Risk Factors/Patient Goals Review:    Core Components/Risk Factors/Patient Goals at Discharge (Final Review):    ITP Comments:   Comments:

## 2017-10-06 NOTE — Progress Notes (Signed)
Pulmonary Individual Treatment Plan  Patient Details  Name: [name] MRN: [id number] Date of Birth: 1927/11/30 Referring Provider:     Pulmonary Rehab Walk Test from 10/04/2017 in [place]  Referring Provider  Dr. Halford Chessman      Initial Encounter Date:    Pulmonary Rehab Walk Test from 10/04/2017 in [place]  Date  10/04/17  Referring Provider  Dr. Halford Chessman      Visit Diagnosis: Centrilobular emphysema ([place])  Patient's Home Medications on Admission:   Current Outpatient Medications:  .  albuterol (PROVENTIL HFA;VENTOLIN HFA) 108 (90 BASE) MCG/ACT inhaler, Inhale 2 puffs into the lungs every 4 (four) hours as needed for wheezing or shortness of breath., Disp: , Rfl:  .  albuterol (PROVENTIL) (2.5 MG/3ML) 0.083% nebulizer solution, USE 1 VIAL IN NEBULIZER EVERY 6 HOURS. Generic: VENTOLIN, Disp: 120 vial, Rfl: 10 .  guaiFENesin (MUCINEX) 600 MG 12 hr tablet, Take 2 tablets (1,200 mg total) by mouth 2 (two) times daily as needed. (Patient not taking: Reported on 10/03/2017), Disp: , Rfl:  .  levothyroxine (SYNTHROID, LEVOTHROID) 50 MCG tablet, Take 50 mcg by mouth daily., Disp: , Rfl:  .  predniSONE (DELTASONE) 5 MG tablet, Take 0.5 tablets (2.5 mg total) by mouth daily with breakfast. (Patient taking differently: Take 2.5 mg by mouth every other day. Taking 1/2 tablet every other day), Disp: 30 tablet, Rfl: 3 .  simvastatin (ZOCOR) 5 MG tablet, Take 5 mg by mouth at bedtime.  , Disp: , Rfl:  .  triamcinolone cream (KENALOG) 0.1 %, Apply topically., Disp: , Rfl:   Past Medical History: Past Medical History:  Diagnosis Date  . Arthritis    osteoarthritis  . Cancer ([place])    skin cancer only.-no melanoma.  Marland Kitchen COPD (chronic obstructive pulmonary disease) ([place])   . History of kidney stones    lithotripsy x1   . History of oxygen administration    Oxygen @ 2 l/m nasally bedtime  . Hyperlipidemia   . Hypothyroidism   .  Pulmonary nodule, right   . 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 Tobacco abuse    Quit 09/17/10    Tobacco Use: Social History   Tobacco Use  Smoking Status Former Smoker  . Packs/day: 1.00  . Years: 55.00  . Pack years: 55.00  . Types: Cigarettes  . Last attempt to quit: 04/22/2011  . Years since quitting: 6.4  Smokeless Tobacco Never Used  Tobacco Comment   occasionally smoked 2-3 times a week--1 cig each time    Labs: Recent Review Flowsheet Data    Labs for ITP Cardiac and Pulmonary Rehab Latest Ref Rng & Units 12/07/2006 12/08/2006 12/11/2006 06/03/2014   Cholestrol - - 114 ATP III CLASSIFICATION: <200     mg/dL   Desirable 200-239  mg/dL   Borderline High >=240    mg/dL   High - -   LDLCALC - - 75 Total Cholesterol/HDL:CHD Risk Coronary Heart Disease Risk Table Men   Women 1/2 Average Risk   3.4   3.3 - -   HDL - - 29(L) - -   Trlycerides - - 48 - -   Hemoglobin A1c <5.7 % - - - 6.5(H)   PHART - - 7.433 7.430 -   PCO2ART - - 32.9(L) 38.0 -   HCO3 - 21.1 21.6 24.8(H) -   TCO2 - 22 22.6 25.9 -   ACIDBASEDEF - 2.0 2.0 - -  O2SAT - - 93.8 94.5 -      Capillary Blood Glucose: No results found for: GLUCAP   Pulmonary Assessment Scores: Pulmonary Assessment Scores    Row Name 10/06/17 1310         ADL UCSD   ADL Phase  Entry       mMRC Score   mMRC Score  3        Pulmonary Function Assessment: Pulmonary Function Assessment - 10/03/17 1037      Breath   Bilateral Breath Sounds  Decreased    Shortness of Breath  Limiting activity;Yes       Exercise Target Goals: Date: 10/04/17  Exercise Program Goal: Individual exercise prescription set using results from initial 6 min walk test and THRR while considering  patient's activity barriers and safety.    Exercise Prescription Goal: Initial exercise prescription builds to 30-45 minutes a day of aerobic activity, 2-3 days per week.  Home exercise guidelines will be given to patient  during program as part of exercise prescription that the participant will acknowledge.  Activity Barriers & Risk Stratification: Activity Barriers & Cardiac Risk Stratification - 10/03/17 1027      Activity Barriers & Cardiac Risk Stratification   Activity Barriers  Left Hip Replacement;Deconditioning;Shortness of Breath       6 Minute Walk: 6 Minute Walk    Row Name 10/06/17 1308         6 Minute Walk   Phase  Initial     Distance  800 feet     Walk Time  6 minutes     # of Rest Breaks  - 2 (50 seconds total)     MPH  1.5     METS  2.15     RPE  13     Perceived Dyspnea   3     Symptoms  No     Resting HR  97 bpm     Resting BP  133/77     Resting Oxygen Saturation   97 %     Exercise Oxygen Saturation  during 6 min walk  94 %     Max Ex. HR  125 bpm     Max Ex. BP  164/76       Interval HR   1 Minute HR  107     2 Minute HR  125     3 Minute HR  116     4 Minute HR  117     5 Minute HR  120     6 Minute HR  120     2 Minute Post HR  114     Interval Heart Rate?  Yes       Interval Oxygen   Interval Oxygen?  Yes     Baseline Oxygen Saturation %  97 %     1 Minute Oxygen Saturation %  97 %     1 Minute Liters of Oxygen  0 L     2 Minute Oxygen Saturation %  95 %     2 Minute Liters of Oxygen  0 L     3 Minute Oxygen Saturation %  96 %     3 Minute Liters of Oxygen  0 L     4 Minute Oxygen Saturation %  95 %     4 Minute Liters of Oxygen  0 L     5 Minute Oxygen Saturation %  94 %  5 Minute Liters of Oxygen  0 L     6 Minute Oxygen Saturation %  94 %     6 Minute Liters of Oxygen  0 L     2 Minute Post Oxygen Saturation %  98 %     2 Minute Post Liters of Oxygen  0 L        Oxygen Initial Assessment: Oxygen Initial Assessment - 10/06/17 1310      Initial 6 min Walk   Oxygen Used  None      Program Oxygen Prescription   Program Oxygen Prescription  None       Oxygen Re-Evaluation:   Oxygen Discharge (Final Oxygen  Re-Evaluation):   Initial Exercise Prescription: Initial Exercise Prescription - 10/06/17 1300      Date of Initial Exercise RX and Referring Provider   Date  10/04/17    Referring Provider  Dr. Halford Chessman      Treadmill   MPH  1    Grade  0    Minutes  17      Bike   Level  0.4    Minutes  17      NuStep   Level  2    SPM  80    Minutes  17    METs  1.5      Prescription Details   Frequency (times per week)  2    Duration  Progress to 45 minutes of aerobic exercise without signs/symptoms of physical distress      Intensity   THRR 40-80% of Max Heartrate  53-106    Ratings of Perceived Exertion  11-13    Perceived Dyspnea  0-4      Progression   Progression  Continue progressive overload as per policy without signs/symptoms or physical distress.      Resistance Training   Training Prescription  Yes    Weight  orange    Reps  10-15       Perform Capillary Blood Glucose checks as needed.  Exercise Prescription Changes:   Exercise Comments:   Exercise Goals and Review: Exercise Goals    Row Name 10/03/17 1028             Exercise Goals   Increase Physical Activity  Yes       Intervention  Provide advice, education, support and counseling about physical activity/exercise needs.;Develop an individualized exercise prescription for aerobic and resistive training based on initial evaluation findings, risk stratification, comorbidities and participant's personal goals.       Expected Outcomes  Short Term: Attend rehab on a regular basis to increase amount of physical activity.;Long Term: Exercising regularly at least 3-5 days a week.;Long Term: Add in home exercise to make exercise part of routine and to increase amount of physical activity.       Increase Strength and Stamina  Yes       Intervention  Provide advice, education, support and counseling about physical activity/exercise needs.;Develop an individualized exercise prescription for aerobic and resistive  training based on initial evaluation findings, risk stratification, comorbidities and participant's personal goals.       Expected Outcomes  Short Term: Increase workloads from initial exercise prescription for resistance, speed, and METs.;Short Term: Perform resistance training exercises routinely during rehab and add in resistance training at home;Long Term: Improve cardiorespiratory fitness, muscular endurance and strength as measured by increased METs and functional capacity (6MWT)       Able to understand and use rate of  perceived exertion (RPE) scale  Yes       Intervention  Provide education and explanation on how to use RPE scale       Expected Outcomes  Short Term: Able to use RPE daily in rehab to express subjective intensity level;Long Term:  Able to use RPE to guide intensity level when exercising independently       Able to understand and use Dyspnea scale  Yes       Intervention  Provide education and explanation on how to use Dyspnea scale       Expected Outcomes  Short Term: Able to use Dyspnea scale daily in rehab to express subjective sense of shortness of breath during exertion;Long Term: Able to use Dyspnea scale to guide intensity level when exercising independently       Knowledge and understanding of Target Heart Rate Range (THRR)  Yes       Intervention  Provide education and explanation of THRR including how the numbers were predicted and where they are located for reference       Expected Outcomes  Short Term: Able to state/look up THRR;Short Term: Able to use daily as guideline for intensity in rehab;Long Term: Able to use THRR to govern intensity when exercising independently       Understanding of Exercise Prescription  Yes       Intervention  Provide education, explanation, and written materials on patient's individual exercise prescription       Expected Outcomes  Short Term: Able to explain program exercise prescription;Long Term: Able to explain home exercise  prescription to exercise independently          Exercise Goals Re-Evaluation :   Discharge Exercise Prescription (Final Exercise Prescription Changes):   Nutrition:  Target Goals: Understanding of nutrition guidelines, daily intake of sodium 1500mg , cholesterol 200mg , calories 30% from fat and 7% or less from saturated fats, daily to have 5 or more servings of fruits and vegetables.  Biometrics: Pre Biometrics - 10/03/17 1039      Pre Biometrics   Grip Strength  38 kg        Nutrition Therapy Plan and Nutrition Goals:   Nutrition Assessments:   Nutrition Goals Re-Evaluation:   Nutrition Goals Discharge (Final Nutrition Goals Re-Evaluation):   Psychosocial: Target Goals: Acknowledge presence or absence of significant depression and/or stress, maximize coping skills, provide positive support system. Participant is able to verbalize types and ability to use techniques and skills needed for reducing stress and depression.  Initial Review & Psychosocial Screening: Initial Psych Review & Screening - 10/03/17 1037      Initial Review   Current issues with  None Identified      Family Dynamics   Good Support System?  Yes      Barriers   Psychosocial barriers to participate in program  There are no identifiable barriers or psychosocial needs.      Screening Interventions   Interventions  Encouraged to exercise       Quality of Life Scores:  Scores of 19 and below usually indicate a poorer quality of life in these areas.  A difference of  2-3 points is a clinically meaningful difference.  A difference of 2-3 points in the total score of the Quality of Life Index has been associated with significant improvement in overall quality of life, self-image, physical symptoms, and general health in studies assessing change in quality of life.   PHQ-9: Recent Review Flowsheet Data    Depression screen  [facility] 2/9 10/03/2017 01/05/2017   Decreased Interest 0 0   Down, Depressed,  Hopeless 0 0   PHQ - 2 Score 0 0     Interpretation of Total Score  Total Score Depression Severity:  1-4 = Minimal depression, 5-9 = Mild depression, 10-14 = Moderate depression, 15-19 = Moderately severe depression, 20-27 = Severe depression   Psychosocial Evaluation and Intervention: Psychosocial Evaluation - 10/03/17 1037      Psychosocial Evaluation & Interventions   Interventions  Encouraged to exercise with the program and follow exercise prescription    Expected Outcomes  patient will remain free from psychosocial barriers to participation in pulmonary rehab.    Continue Psychosocial Services   Follow up required by staff       Psychosocial Re-Evaluation:   Psychosocial Discharge (Final Psychosocial Re-Evaluation):   Education: Education Goals: Education classes will be provided on a weekly basis, covering required topics. Participant will state understanding/return demonstration of topics presented.  Learning Barriers/Preferences: Learning Barriers/Preferences - 10/03/17 1036      Learning Barriers/Preferences   Learning Barriers  None    Learning Preferences  Written Material       Education Topics: Risk Factor Reduction:  -Group instruction that is supported by a PowerPoint presentation. Instructor discusses the definition of a risk factor, different risk factors for pulmonary disease, and how the heart and lungs work together.     Nutrition for Pulmonary Patient:  -Group instruction provided by PowerPoint slides, verbal discussion, and written materials to support subject matter. The instructor gives an explanation and review of healthy diet recommendations, which includes a discussion on weight management, recommendations for fruit and vegetable consumption, as well as protein, fluid, caffeine, fiber, sodium, sugar, and alcohol. Tips for eating when patients are short of breath are discussed.   Pursed Lip Breathing:  -Group instruction that is supported by  demonstration and informational handouts. Instructor discusses the benefits of pursed lip and diaphragmatic breathing and detailed demonstration on how to preform both.     Oxygen Safety:  -Group instruction provided by PowerPoint, verbal discussion, and written material to support subject matter. There is an overview of "What is Oxygen" and "Why do we need it".  Instructor also reviews how to create a safe environment for oxygen use, the importance of using oxygen as prescribed, and the risks of noncompliance. There is a brief discussion on traveling with oxygen and resources the patient may utilize.   Oxygen Equipment:  -Group instruction provided by [facility] Staff utilizing handouts, written materials, and equipment demonstrations.   Signs and Symptoms:  -Group instruction provided by written material and verbal discussion to support subject matter. Warning signs and symptoms of infection, stroke, and heart attack are reviewed and when to call the physician/911 reinforced. Tips for preventing the spread of infection discussed.   Advanced Directives:  -Group instruction provided by verbal instruction and written material to support subject matter. Instructor reviews Advanced Directive laws and proper instruction for filling out document.   Pulmonary Video:  -Group video education that reviews the importance of medication and oxygen compliance, exercise, good nutrition, pulmonary hygiene, and pursed lip and diaphragmatic breathing for the pulmonary patient.   Exercise for the Pulmonary Patient:  -Group instruction that is supported by a PowerPoint presentation. Instructor discusses benefits of exercise, core components of exercise, frequency, duration, and intensity of an exercise routine, importance of utilizing pulse oximetry during exercise, safety while exercising, and options of places to exercise outside of rehab.  Pulmonary Medications:  -Verbally interactive group education  provided by instructor with focus on inhaled medications and proper administration.   Anatomy and Physiology of the Respiratory System and Intimacy:  -Group instruction provided by PowerPoint, verbal discussion, and written material to support subject matter. Instructor reviews respiratory cycle and anatomical components of the respiratory system and their functions. Instructor also reviews differences in obstructive and restrictive respiratory diseases with examples of each. Intimacy, Sex, and Sexuality differences are reviewed with a discussion on how relationships can change when diagnosed with pulmonary disease. Common sexual concerns are reviewed.   MD DAY -A group question and answer session with a medical doctor that allows participants to ask questions that relate to their pulmonary disease state.   OTHER EDUCATION -Group or individual verbal, written, or video instructions that support the educational goals of the pulmonary rehab program.   Holiday Eating Survival Tips:  -Group instruction provided by PowerPoint slides, verbal discussion, and written materials to support subject matter. The instructor gives patients tips, tricks, and techniques to help them not only survive but enjoy the holidays despite the onslaught of food that accompanies the holidays.   Knowledge Questionnaire Score: Knowledge Questionnaire Score - 10/03/17 1036      Knowledge Questionnaire Score   Pre Score  15/18       Core Components/Risk Factors/Patient Goals at Admission: Personal Goals and Risk Factors at Admission - 10/03/17 1037      Core Components/Risk Factors/Patient Goals on Admission   Improve shortness of breath with ADL's  Yes    Intervention  Provide education, individualized exercise plan and daily activity instruction to help decrease symptoms of SOB with activities of daily living.    Expected Outcomes  Short Term: Improve cardiorespiratory fitness to achieve a reduction of symptoms  when performing ADLs;Long Term: Be able to perform more ADLs without symptoms or delay the onset of symptoms       Core Components/Risk Factors/Patient Goals Review:    Core Components/Risk Factors/Patient Goals at Discharge (Final Review):    ITP Comments:   Comments:

## 2017-10-11 ENCOUNTER — Encounter (HOSPITAL_COMMUNITY)
Admission: RE | Admit: 2017-10-11 | Discharge: 2017-10-11 | Disposition: A | Discharge status: Home / Self Care | Payer: No Typology Code available for payment source | Source: Ambulatory Visit | Attending: Pulmonary Disease | Admitting: Pulmonary Disease

## 2017-10-11 DIAGNOSIS — J432 Centrilobular emphysema: Secondary | ICD-10-CM | POA: Diagnosis not present

## 2017-10-11 NOTE — Progress Notes (Signed)
Daily Session Note  Patient Details  Name: [name] MRN: [id number] Date of Birth: [birth year] Referring Provider:     Pulmonary Rehab Walk Test from 10/04/2017 in [place]  Referring Provider  Dr. Halford Chessman      Encounter Date: 10/11/2017  Check In: Session Check In - 10/11/17 1514      Check-In   Location  [facility]    Staff Present  Cloyde Reams DiVincenzo, MS, ACSM RCEP, Exercise Physiologist    Supervising physician immediately available to respond to emergencies  [facility] immediately available    Physician(s)  Dr. Maylene Roes    Fall or balance concerns reported     No    Tobacco Cessation  No Change    Warm-up and Cool-down  Performed as group-led instruction    Resistance Training Performed  Yes    VAD Patient?  No      Pain Assessment   Currently in Pain?  No/denies    Multiple Pain Sites  No       Capillary Blood Glucose: No results found for this or any previous visit (from the past 24 hour(s)).    Social History   Tobacco Use  Smoking Status Former Smoker  . Packs/day: 1.00  . Years: 55.00  . Pack years: 55.00  . Types: Cigarettes  . Last attempt to quit: 04/22/2011  . Years since quitting: 6.4  Smokeless Tobacco Never Used  Tobacco Comment   occasionally smoked 2-3 times a week--1 cig each time    Goals Met:  Exercise tolerated well No report of cardiac concerns or symptoms Strength training completed today  Goals Unmet:  Not Applicable  Comments: Service time is from 1330 to 1500    Dr. Rush Farmer is Medical Director for Pulmonary Rehab at [facility].

## 2017-10-13 ENCOUNTER — Encounter (HOSPITAL_COMMUNITY)
Admission: RE | Admit: 2017-10-13 | Discharge: 2017-10-13 | Disposition: A | Discharge status: Home / Self Care | Payer: No Typology Code available for payment source | Source: Ambulatory Visit | Attending: Pulmonary Disease | Admitting: Pulmonary Disease

## 2017-10-13 DIAGNOSIS — J432 Centrilobular emphysema: Secondary | ICD-10-CM

## 2017-10-13 NOTE — Progress Notes (Signed)
Daily Session Note  Patient Details  Name: [name] MRN: [id number] Date of Birth: 08/04/1927 Referring Provider:     Pulmonary Rehab Walk Test from 10/04/2017 in [facility]  Referring Provider  Dr. Sood      Encounter Date: 10/13/2017  Check In: Session Check In - 10/13/17 1247      Check-In   Location  [facility]    Staff Present  Molly DiVincenzo, MS, ACSM RCEP, Exercise Physiologist;Lisa Hughes, RN    Supervising physician immediately available to respond to emergencies  [facility] immediately available    Physician(s)  Dr. Choi    Medication changes reported      No    Fall or balance concerns reported     No    Tobacco Cessation  No Change    Warm-up and Cool-down  Performed as group-led instruction    Resistance Training Performed  Yes    VAD Patient?  No      Pain Assessment   Currently in Pain?  No/denies    Multiple Pain Sites  No       Capillary Blood Glucose: No results found for this or any previous visit (from the past 24 hour(s)).    Social History   Tobacco Use  Smoking Status Former Smoker  . Packs/day: 1.00  . Years: 55.00  . Pack years: 55.00  . Types: Cigarettes  . Last attempt to quit: 04/22/2011  . Years since quitting: 6.4  Smokeless Tobacco Never Used  Tobacco Comment   occasionally smoked 2-3 times a week--1 cig each time    Goals Met:  Exercise tolerated well No report of cardiac concerns or symptoms Strength training completed today  Goals Unmet:  Not Applicable  Comments: Service time is from 1230 to 1435    Dr. Wesam G. Yacoub is Medical Director for Pulmonary Rehab at  Hospital. 

## 2017-10-18 ENCOUNTER — Encounter (HOSPITAL_COMMUNITY)
Admission: RE | Admit: 2017-10-18 | Discharge: 2017-10-18 | Disposition: A | Discharge status: Home / Self Care | Payer: No Typology Code available for payment source | Source: Ambulatory Visit | Attending: Pulmonary Disease | Admitting: Pulmonary Disease

## 2017-10-18 VITALS — Wt 178.1 lb

## 2017-10-18 DIAGNOSIS — J432 Centrilobular emphysema: Secondary | ICD-10-CM

## 2017-10-18 NOTE — Progress Notes (Signed)
Daily Session Note  Patient Details  Name: [name] MRN: [id number] Date of Birth: Apr 10, 1928 Referring Provider:     Pulmonary Rehab Walk Test from 10/04/2017 in [place]  Referring Provider  Dr. Halford Chessman      Encounter Date: 10/18/2017  Check In: Session Check In - 10/18/17 1522      Check-In   Staff Present  Rodney Langton, RN;Molly DiVincenzo, MS, ACSM RCEP, Exercise Physiologist;Lenni Reckner Rollene Rotunda, RN, BSN    Supervising physician immediately available to respond to emergencies  [facility] immediately available    Physician(s)  Dr. Maylene Roes    Medication changes reported      No    Fall or balance concerns reported     No    Tobacco Cessation  No Change    Warm-up and Cool-down  Performed as group-led instruction    Resistance Training Performed  Yes    VAD Patient?  No      Pain Assessment   Currently in Pain?  No/denies    Multiple Pain Sites  No       Capillary Blood Glucose: No results found for this or any previous visit (from the past 24 hour(s)).  Exercise Prescription Changes - 10/18/17 1538      Response to Exercise   Blood Pressure (Admit)  130/50    Blood Pressure (Exercise)  104/50    Blood Pressure (Exit)  136/60    Heart Rate (Admit)  75 bpm    Heart Rate (Exercise)  102 bpm    Heart Rate (Exit)  86 bpm    Oxygen Saturation (Admit)  99 %    Oxygen Saturation (Exercise)  98 %    Oxygen Saturation (Exit)  100 %    Rating of Perceived Exertion (Exercise)  13    Perceived Dyspnea (Exercise)  2    Duration  Progress to 45 minutes of aerobic exercise without signs/symptoms of physical distress    Intensity  THRR unchanged 40-80% HRR      Resistance Training   Training Prescription  Yes    Weight  orange    Reps  10-15      Treadmill   MPH  1    Grade  0    Minutes  17      Bike   Level  1 recumbent bike    Minutes  17      NuStep   Level  2    SPM  80    Minutes  17    METs  2.2       Social History    Tobacco Use  Smoking Status Former Smoker  . Packs/day: 1.00  . Years: 55.00  . Pack years: 55.00  . Types: Cigarettes  . Last attempt to quit: 04/22/2011  . Years since quitting: 6.4  Smokeless Tobacco Never Used  Tobacco Comment   occasionally smoked 2-3 times a week--1 cig each time    Goals Met:  Exercise tolerated well Queuing for purse lip breathing No report of cardiac concerns or symptoms Strength training completed today  Goals Unmet:  Not Applicable  Comments: Service time is from 1330 to 1500   Dr. Rush Farmer is Medical Director for Pulmonary Rehab at [facility].

## 2017-10-20 ENCOUNTER — Encounter (HOSPITAL_COMMUNITY)
Admission: RE | Admit: 2017-10-20 | Discharge: 2017-10-20 | Disposition: A | Discharge status: Home / Self Care | Payer: No Typology Code available for payment source | Source: Ambulatory Visit | Attending: Pulmonary Disease | Admitting: Pulmonary Disease

## 2017-10-20 DIAGNOSIS — Z85828 Personal history of other malignant neoplasm of skin: Secondary | ICD-10-CM | POA: Diagnosis not present

## 2017-10-20 DIAGNOSIS — J432 Centrilobular emphysema: Secondary | ICD-10-CM | POA: Insufficient documentation

## 2017-10-20 DIAGNOSIS — Z87891 Personal history of nicotine dependence: Secondary | ICD-10-CM | POA: Diagnosis not present

## 2017-10-20 DIAGNOSIS — Z79899 Other long term (current) drug therapy: Secondary | ICD-10-CM | POA: Insufficient documentation

## 2017-10-20 DIAGNOSIS — E785 Hyperlipidemia, unspecified: Secondary | ICD-10-CM | POA: Insufficient documentation

## 2017-10-20 NOTE — Progress Notes (Signed)
Daily Session Note  Patient Details  Name: [name] MRN: [id number] Date of Birth: 07/16/1927 Referring Provider:     Pulmonary Rehab Walk Test from 10/04/2017 in [place]  Referring Provider  Dr. Halford Chessman      Encounter Date: 10/20/2017  Check In: Session Check In - 10/20/17 1330      Check-In   Location  [facility]    Staff Present  Rodney Langton, RN;Molly DiVincenzo, MS, ACSM RCEP, Exercise Physiologist;Portia Rollene Rotunda, RN, BSN    Supervising physician immediately available to respond to emergencies  [facility] immediately available    Physician(s)  Dr. Reesa Chew    Medication changes reported      No    Fall or balance concerns reported     No    Tobacco Cessation  No Change    Warm-up and Cool-down  Performed as group-led instruction    Resistance Training Performed  Yes    VAD Patient?  No      Pain Assessment   Currently in Pain?  No/denies    Multiple Pain Sites  No       Capillary Blood Glucose: No results found for this or any previous visit (from the past 24 hour(s)).    Social History   Tobacco Use  Smoking Status Former Smoker  . Packs/day: 1.00  . Years: 55.00  . Pack years: 55.00  . Types: Cigarettes  . Last attempt to quit: 04/22/2011  . Years since quitting: 6.5  Smokeless Tobacco Never Used  Tobacco Comment   occasionally smoked 2-3 times a week--1 cig each time    Goals Met:  Exercise tolerated well No report of cardiac concerns or symptoms Strength training completed today  Goals Unmet:  Not Applicable  Comments: Service time is from 1330 to 1545    Dr. Rush Farmer is Medical Director for Pulmonary Rehab at [facility].

## 2017-10-25 ENCOUNTER — Encounter (HOSPITAL_COMMUNITY)
Admission: RE | Admit: 2017-10-25 | Discharge: 2017-10-25 | Disposition: A | Discharge status: Home / Self Care | Payer: No Typology Code available for payment source | Source: Ambulatory Visit | Attending: Pulmonary Disease | Admitting: Pulmonary Disease

## 2017-10-25 DIAGNOSIS — J432 Centrilobular emphysema: Secondary | ICD-10-CM | POA: Diagnosis not present

## 2017-10-25 NOTE — Progress Notes (Signed)
Daily Session Note  Patient Details  Name: [name] MRN: [id number] Date of Birth: July 10, 1927 Referring Provider:     Pulmonary Rehab Walk Test from 10/04/2017 in [place]  Referring Provider  Dr. Halford Chessman      Encounter Date: 10/25/2017  Check In: Session Check In - 10/25/17 1330      Check-In   Location  [facility]    Staff Present  Rodney Langton, RN;Molly DiVincenzo, MS, ACSM RCEP, Exercise Physiologist;Portia Rollene Rotunda, RN, BSN;Carlette Carlton, RN, Deland Pretty, MS, ACSM CEP, Exercise Physiologist    Supervising physician immediately available to respond to emergencies  [facility] immediately available    Physician(s)  Dr. Reesa Chew    Medication changes reported      No    Fall or balance concerns reported     No    Tobacco Cessation  No Change    Warm-up and Cool-down  Performed as group-led instruction    Resistance Training Performed  Yes    VAD Patient?  No      Pain Assessment   Currently in Pain?  No/denies    Multiple Pain Sites  No       Capillary Blood Glucose: No results found for this or any previous visit (from the past 24 hour(s)).    Social History   Tobacco Use  Smoking Status Former Smoker  . Packs/day: 1.00  . Years: 55.00  . Pack years: 55.00  . Types: Cigarettes  . Last attempt to quit: 04/22/2011  . Years since quitting: 6.5  Smokeless Tobacco Never Used  Tobacco Comment   occasionally smoked 2-3 times a week--1 cig each time    Goals Met:  Exercise tolerated well No report of cardiac concerns or symptoms Strength training completed today  Goals Unmet:  Not Applicable  Comments: Service time is from 1330 to 1510    Dr. Rush Farmer is Medical Director for Pulmonary Rehab at [facility].

## 2017-10-27 ENCOUNTER — Encounter (HOSPITAL_COMMUNITY)
Admission: RE | Admit: 2017-10-27 | Discharge: 2017-10-27 | Disposition: A | Discharge status: Home / Self Care | Payer: No Typology Code available for payment source | Source: Ambulatory Visit | Attending: Pulmonary Disease | Admitting: Pulmonary Disease

## 2017-10-27 DIAGNOSIS — J432 Centrilobular emphysema: Secondary | ICD-10-CM | POA: Diagnosis not present

## 2017-10-27 NOTE — Progress Notes (Signed)
Pulmonary Individual Treatment Plan  Patient Details  Name: [name] MRN: [id number] Date of Birth: 11-10-1927 Referring Provider:     Pulmonary Rehab Walk Test from 10/04/2017 in [place]  Referring Provider  Dr. Halford Chessman      Initial Encounter Date:    Pulmonary Rehab Walk Test from 10/04/2017 in [place]  Date  10/04/17  Referring Provider  Dr. Halford Chessman      Visit Diagnosis: Centrilobular emphysema ([place])  Patient's Home Medications on Admission:   Current Outpatient Medications:  .  albuterol (PROVENTIL HFA;VENTOLIN HFA) 108 (90 BASE) MCG/ACT inhaler, Inhale 2 puffs into the lungs every 4 (four) hours as needed for wheezing or shortness of breath., Disp: , Rfl:  .  albuterol (PROVENTIL) (2.5 MG/3ML) 0.083% nebulizer solution, USE 1 VIAL IN NEBULIZER EVERY 6 HOURS. Generic: VENTOLIN, Disp: 120 vial, Rfl: 10 .  guaiFENesin (MUCINEX) 600 MG 12 hr tablet, Take 2 tablets (1,200 mg total) by mouth 2 (two) times daily as needed. (Patient not taking: Reported on 10/03/2017), Disp: , Rfl:  .  levothyroxine (SYNTHROID, LEVOTHROID) 50 MCG tablet, Take 50 mcg by mouth daily., Disp: , Rfl:  .  predniSONE (DELTASONE) 5 MG tablet, Take 0.5 tablets (2.5 mg total) by mouth daily with breakfast. (Patient taking differently: Take 2.5 mg by mouth every other day. Taking 1/2 tablet every other day), Disp: 30 tablet, Rfl: 3 .  simvastatin (ZOCOR) 5 MG tablet, Take 5 mg by mouth at bedtime.  , Disp: , Rfl:  .  triamcinolone cream (KENALOG) 0.1 %, Apply topically., Disp: , Rfl:   Past Medical History: Past Medical History:  Diagnosis Date  . Arthritis    osteoarthritis  . Cancer ([place])    skin cancer only.-no melanoma.  Marland Kitchen COPD (chronic obstructive pulmonary disease) ([place])   . History of kidney stones    lithotripsy x1   . History of oxygen administration    Oxygen @ 2 l/m nasally bedtime  . Hyperlipidemia   . Hypothyroidism   .  Pulmonary nodule, right   . 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 Tobacco abuse    Quit 09/17/10    Tobacco Use: Social History   Tobacco Use  Smoking Status Former Smoker  . Packs/day: 1.00  . Years: 55.00  . Pack years: 55.00  . Types: Cigarettes  . Last attempt to quit: 04/22/2011  . Years since quitting: 6.5  Smokeless Tobacco Never Used  Tobacco Comment   occasionally smoked 2-3 times a week--1 cig each time    Labs: Recent Review Flowsheet Data    Labs for ITP Cardiac and Pulmonary Rehab Latest Ref Rng & Units 12/07/2006 12/08/2006 12/11/2006 06/03/2014   Cholestrol - - 114 ATP III CLASSIFICATION: <200     mg/dL   Desirable 200-239  mg/dL   Borderline High >=240    mg/dL   High - -   LDLCALC - - 75 Total Cholesterol/HDL:CHD Risk Coronary Heart Disease Risk Table Men   Women 1/2 Average Risk   3.4   3.3 - -   HDL - - 29(L) - -   Trlycerides - - 48 - -   Hemoglobin A1c <5.7 % - - - 6.5(H)   PHART - - 7.433 7.430 -   PCO2ART - - 32.9(L) 38.0 -   HCO3 - 21.1 21.6 24.8(H) -   TCO2 - 22 22.6 25.9 -   ACIDBASEDEF - 2.0 2.0 - -  O2SAT - - 93.8 94.5 -      Capillary Blood Glucose: No results found for: GLUCAP   Pulmonary Assessment Scores: Pulmonary Assessment Scores    Row Name 10/06/17 1310 10/06/17 1642       ADL UCSD   ADL Phase  Entry  Entry    SOB Score total  -  63      CAT Score   CAT Score  -  17 Entry      mMRC Score   mMRC Score  3  -       Pulmonary Function Assessment: Pulmonary Function Assessment - 10/03/17 1037      Breath   Bilateral Breath Sounds  Decreased    Shortness of Breath  Limiting activity;Yes       Exercise Target Goals:    Exercise Program Goal: Individual exercise prescription set using results from initial 6 min walk test and THRR while considering  patient's activity barriers and safety.    Exercise Prescription Goal: Initial exercise prescription builds to 30-45 minutes a day of aerobic  activity, 2-3 days per week.  Home exercise guidelines will be given to patient during program as part of exercise prescription that the participant will acknowledge.  Activity Barriers & Risk Stratification: Activity Barriers & Cardiac Risk Stratification - 10/03/17 1027      Activity Barriers & Cardiac Risk Stratification   Activity Barriers  Left Hip Replacement;Deconditioning;Shortness of Breath       6 Minute Walk: 6 Minute Walk    Row Name 10/06/17 1308         6 Minute Walk   Phase  Initial     Distance  800 feet     Walk Time  6 minutes     # of Rest Breaks  - 2 (50 seconds total)     MPH  1.5     METS  2.15     RPE  13     Perceived Dyspnea   3     Symptoms  No     Resting HR  97 bpm     Resting BP  133/77     Resting Oxygen Saturation   97 %     Exercise Oxygen Saturation  during 6 min walk  94 %     Max Ex. HR  125 bpm     Max Ex. BP  164/76       Interval HR   1 Minute HR  107     2 Minute HR  125     3 Minute HR  116     4 Minute HR  117     5 Minute HR  120     6 Minute HR  120     2 Minute Post HR  114     Interval Heart Rate?  Yes       Interval Oxygen   Interval Oxygen?  Yes     Baseline Oxygen Saturation %  97 %     1 Minute Oxygen Saturation %  97 %     1 Minute Liters of Oxygen  0 L     2 Minute Oxygen Saturation %  95 %     2 Minute Liters of Oxygen  0 L     3 Minute Oxygen Saturation %  96 %     3 Minute Liters of Oxygen  0 L     4 Minute Oxygen Saturation %  95 %  4 Minute Liters of Oxygen  0 L     5 Minute Oxygen Saturation %  94 %     5 Minute Liters of Oxygen  0 L     6 Minute Oxygen Saturation %  94 %     6 Minute Liters of Oxygen  0 L     2 Minute Post Oxygen Saturation %  98 %     2 Minute Post Liters of Oxygen  0 L        Oxygen Initial Assessment: Oxygen Initial Assessment - 10/06/17 1310      Initial 6 min Walk   Oxygen Used  None      Program Oxygen Prescription   Program Oxygen Prescription  None        Oxygen Re-Evaluation: Oxygen Re-Evaluation    Row Name 10/25/17 0728             Program Oxygen Prescription   Program Oxygen Prescription  None         Home Oxygen   Home Oxygen Device  Home Concentrator       Sleep Oxygen Prescription  Continuous       Liters per minute  2       Home Exercise Oxygen Prescription  None       Home at Rest Exercise Oxygen Prescription  None       Compliance with Home Oxygen Use  Yes         Goals/Expected Outcomes   Short Term Goals  To learn and exhibit compliance with exercise, home and travel O2 prescription;To learn and understand importance of monitoring SPO2 with pulse oximeter and demonstrate accurate use of the pulse oximeter.;To learn and understand importance of maintaining oxygen saturations>88%;To learn and demonstrate proper pursed lip breathing techniques or other breathing techniques.;To learn and demonstrate proper use of respiratory medications       Long  Term Goals  Exhibits compliance with exercise, home and travel O2 prescription;Verbalizes importance of monitoring SPO2 with pulse oximeter and return demonstration;Maintenance of O2 saturations>88%;Exhibits proper breathing techniques, such as pursed lip breathing or other method taught during program session;Compliance with respiratory medication;Demonstrates proper use of MDI's       Goals/Expected Outcomes  Patient is compliant          Oxygen Discharge (Final Oxygen Re-Evaluation): Oxygen Re-Evaluation - 10/25/17 0728      Program Oxygen Prescription   Program Oxygen Prescription  None      Home Oxygen   Home Oxygen Device  Home Concentrator    Sleep Oxygen Prescription  Continuous    Liters per minute  2    Home Exercise Oxygen Prescription  None    Home at Rest Exercise Oxygen Prescription  None    Compliance with Home Oxygen Use  Yes      Goals/Expected Outcomes   Short Term Goals  To learn and exhibit compliance with exercise, home and travel O2  prescription;To learn and understand importance of monitoring SPO2 with pulse oximeter and demonstrate accurate use of the pulse oximeter.;To learn and understand importance of maintaining oxygen saturations>88%;To learn and demonstrate proper pursed lip breathing techniques or other breathing techniques.;To learn and demonstrate proper use of respiratory medications    Long  Term Goals  Exhibits compliance with exercise, home and travel O2 prescription;Verbalizes importance of monitoring SPO2 with pulse oximeter and return demonstration;Maintenance of O2 saturations>88%;Exhibits proper breathing techniques, such as pursed lip breathing or other method taught during program session;Compliance  with respiratory medication;Demonstrates proper use of MDI's    Goals/Expected Outcomes  Patient is compliant       Initial Exercise Prescription: Initial Exercise Prescription - 10/06/17 1300      Date of Initial Exercise RX and Referring Provider   Date  10/04/17    Referring Provider  Dr. Halford Chessman      Treadmill   MPH  1    Grade  0    Minutes  17      Bike   Level  0.4    Minutes  17      NuStep   Level  2    SPM  80    Minutes  17    METs  1.5      Prescription Details   Frequency (times per week)  2    Duration  Progress to 45 minutes of aerobic exercise without signs/symptoms of physical distress      Intensity   THRR 40-80% of Max Heartrate  53-106    Ratings of Perceived Exertion  11-13    Perceived Dyspnea  0-4      Progression   Progression  Continue progressive overload as per policy without signs/symptoms or physical distress.      Resistance Training   Training Prescription  Yes    Weight  orange    Reps  10-15       Perform Capillary Blood Glucose checks as needed.  Exercise Prescription Changes: Exercise Prescription Changes    Row Name 10/18/17 1538             Response to Exercise   Blood Pressure (Admit)  130/50       Blood Pressure (Exercise)  104/50        Blood Pressure (Exit)  136/60       Heart Rate (Admit)  75 bpm       Heart Rate (Exercise)  102 bpm       Heart Rate (Exit)  86 bpm       Oxygen Saturation (Admit)  99 %       Oxygen Saturation (Exercise)  98 %       Oxygen Saturation (Exit)  100 %       Rating of Perceived Exertion (Exercise)  13       Perceived Dyspnea (Exercise)  2       Duration  Progress to 45 minutes of aerobic exercise without signs/symptoms of physical distress       Intensity  THRR unchanged 40-80% HRR         Resistance Training   Training Prescription  Yes       Weight  orange       Reps  10-15         Treadmill   MPH  1       Grade  0       Minutes  17         Bike   Level  1 recumbent bike       Minutes  17         NuStep   Level  2       SPM  80       Minutes  17       METs  2.2          Exercise Comments:   Exercise Goals and Review: Exercise Goals    Row Name 10/03/17 1028  Exercise Goals   Increase Physical Activity  Yes       Intervention  Provide advice, education, support and counseling about physical activity/exercise needs.;Develop an individualized exercise prescription for aerobic and resistive training based on initial evaluation findings, risk stratification, comorbidities and participant's personal goals.       Expected Outcomes  Short Term: Attend rehab on a regular basis to increase amount of physical activity.;Long Term: Exercising regularly at least 3-5 days a week.;Long Term: Add in home exercise to make exercise part of routine and to increase amount of physical activity.       Increase Strength and Stamina  Yes       Intervention  Provide advice, education, support and counseling about physical activity/exercise needs.;Develop an individualized exercise prescription for aerobic and resistive training based on initial evaluation findings, risk stratification, comorbidities and participant's personal goals.       Expected Outcomes  Short Term: Increase  workloads from initial exercise prescription for resistance, speed, and METs.;Short Term: Perform resistance training exercises routinely during rehab and add in resistance training at home;Long Term: Improve cardiorespiratory fitness, muscular endurance and strength as measured by increased METs and functional capacity (6MWT)       Able to understand and use rate of perceived exertion (RPE) scale  Yes       Intervention  Provide education and explanation on how to use RPE scale       Expected Outcomes  Short Term: Able to use RPE daily in rehab to express subjective intensity level;Long Term:  Able to use RPE to guide intensity level when exercising independently       Able to understand and use Dyspnea scale  Yes       Intervention  Provide education and explanation on how to use Dyspnea scale       Expected Outcomes  Short Term: Able to use Dyspnea scale daily in rehab to express subjective sense of shortness of breath during exertion;Long Term: Able to use Dyspnea scale to guide intensity level when exercising independently       Knowledge and understanding of Target Heart Rate Range (THRR)  Yes       Intervention  Provide education and explanation of THRR including how the numbers were predicted and where they are located for reference       Expected Outcomes  Short Term: Able to state/look up THRR;Short Term: Able to use daily as guideline for intensity in rehab;Long Term: Able to use THRR to govern intensity when exercising independently       Understanding of Exercise Prescription  Yes       Intervention  Provide education, explanation, and written materials on patient's individual exercise prescription       Expected Outcomes  Short Term: Able to explain program exercise prescription;Long Term: Able to explain home exercise prescription to exercise independently          Exercise Goals Re-Evaluation : Exercise Goals Re-Evaluation    Row Name 10/25/17 0728             Exercise Goal  Re-Evaluation   Exercise Goals Review  Increase Physical Activity;Able to understand and use rate of perceived exertion (RPE) scale;Knowledge and understanding of Target Heart Rate Range (THRR);Understanding of Exercise Prescription;Increase Strength and Stamina;Able to understand and use Dyspnea scale       Comments  Patient has only attended 4 rehab sessions but is performing well. Limited by shortness of breath. Will cont to monitor and motivate  as tolerated.        Expected Outcomes  Through exercise at rehab and at home, patient will increase physical capacity and be able to carry out ADL's with ease at home. Patient will also gain the confidence and knowledge to adhere to an exercise regime at home.          Discharge Exercise Prescription (Final Exercise Prescription Changes): Exercise Prescription Changes - 10/18/17 1538      Response to Exercise   Blood Pressure (Admit)  130/50    Blood Pressure (Exercise)  104/50    Blood Pressure (Exit)  136/60    Heart Rate (Admit)  75 bpm    Heart Rate (Exercise)  102 bpm    Heart Rate (Exit)  86 bpm    Oxygen Saturation (Admit)  99 %    Oxygen Saturation (Exercise)  98 %    Oxygen Saturation (Exit)  100 %    Rating of Perceived Exertion (Exercise)  13    Perceived Dyspnea (Exercise)  2    Duration  Progress to 45 minutes of aerobic exercise without signs/symptoms of physical distress    Intensity  THRR unchanged 40-80% HRR      Resistance Training   Training Prescription  Yes    Weight  orange    Reps  10-15      Treadmill   MPH  1    Grade  0    Minutes  17      Bike   Level  1 recumbent bike    Minutes  17      NuStep   Level  2    SPM  80    Minutes  17    METs  2.2       Nutrition:  Target Goals: Understanding of nutrition guidelines, daily intake of sodium '1500mg'$ , cholesterol '200mg'$ , calories 30% from fat and 7% or less from saturated fats, daily to have 5 or more servings of fruits and  vegetables.  Biometrics: Pre Biometrics - 10/03/17 1039      Pre Biometrics   Grip Strength  38 kg        Nutrition Therapy Plan and Nutrition Goals:   Nutrition Assessments:   Nutrition Goals Re-Evaluation:   Nutrition Goals Discharge (Final Nutrition Goals Re-Evaluation):   Psychosocial: Target Goals: Acknowledge presence or absence of significant depression and/or stress, maximize coping skills, provide positive support system. Participant is able to verbalize types and ability to use techniques and skills needed for reducing stress and depression.  Initial Review & Psychosocial Screening: Initial Psych Review & Screening - 10/03/17 1037      Initial Review   Current issues with  None Identified      Family Dynamics   Good Support System?  Yes      Barriers   Psychosocial barriers to participate in program  There are no identifiable barriers or psychosocial needs.      Screening Interventions   Interventions  Encouraged to exercise       Quality of Life Scores:  Scores of 19 and below usually indicate a poorer quality of life in these areas.  A difference of  2-3 points is a clinically meaningful difference.  A difference of 2-3 points in the total score of the Quality of Life Index has been associated with significant improvement in overall quality of life, self-image, physical symptoms, and general health in studies assessing change in quality of life.   PHQ-9: Recent Review Flowsheet Data  Depression screen [facility] 2/9 10/03/2017 01/05/2017   Decreased Interest 0 0   Down, Depressed, Hopeless 0 0   PHQ - 2 Score 0 0     Interpretation of Total Score  Total Score Depression Severity:  1-4 = Minimal depression, 5-9 = Mild depression, 10-14 = Moderate depression, 15-19 = Moderately severe depression, 20-27 = Severe depression   Psychosocial Evaluation and Intervention: Psychosocial Evaluation - 10/03/17 1037      Psychosocial Evaluation & Interventions    Interventions  Encouraged to exercise with the program and follow exercise prescription    Expected Outcomes  patient will remain free from psychosocial barriers to participation in pulmonary rehab.    Continue Psychosocial Services   Follow up required by staff       Psychosocial Re-Evaluation: Psychosocial Re-Evaluation    [place] Name 10/24/17 1741             Psychosocial Re-Evaluation   Current issues with  None Identified       Expected Outcomes  patient will remain free from psychosocial barriers to participation in pulmonary rehab       Continue Psychosocial Services   Follow up required by staff          Psychosocial Discharge (Final Psychosocial Re-Evaluation): Psychosocial Re-Evaluation - 10/24/17 1741      Psychosocial Re-Evaluation   Current issues with  None Identified    Expected Outcomes  patient will remain free from psychosocial barriers to participation in pulmonary rehab    Continue Psychosocial Services   Follow up required by staff       Education: Education Goals: Education classes will be provided on a weekly basis, covering required topics. Participant will state understanding/return demonstration of topics presented.  Learning Barriers/Preferences: Learning Barriers/Preferences - 10/03/17 1036      Learning Barriers/Preferences   Learning Barriers  None    Learning Preferences  Written Material       Education Topics: Risk Factor Reduction:  -Group instruction that is supported by a PowerPoint presentation. Instructor discusses the definition of a risk factor, different risk factors for pulmonary disease, and how the heart and lungs work together.     Nutrition for Pulmonary Patient:  -Group instruction provided by PowerPoint slides, verbal discussion, and written materials to support subject matter. The instructor gives an explanation and review of healthy diet recommendations, which includes a discussion on weight management, recommendations  for fruit and vegetable consumption, as well as protein, fluid, caffeine, fiber, sodium, sugar, and alcohol. Tips for eating when patients are short of breath are discussed.   Pursed Lip Breathing:  -Group instruction that is supported by demonstration and informational handouts. Instructor discusses the benefits of pursed lip and diaphragmatic breathing and detailed demonstration on how to preform both.     Oxygen Safety:  -Group instruction provided by PowerPoint, verbal discussion, and written material to support subject matter. There is an overview of "What is Oxygen" and "Why do we need it".  Instructor also reviews how to create a safe environment for oxygen use, the importance of using oxygen as prescribed, and the risks of noncompliance. There is a brief discussion on traveling with oxygen and resources the patient may utilize.   Oxygen Equipment:  -Group instruction provided by [facility] Staff utilizing handouts, written materials, and equipment demonstrations.   PULMONARY REHAB OTHER RESPIRATORY from 10/20/2017 in [place]  Date  10/20/17  Educator  george with [facility]  Instruction Review Code  1-  Verbalizes Understanding      Signs and Symptoms:  -Group instruction provided by written material and verbal discussion to support subject matter. Warning signs and symptoms of infection, stroke, and heart attack are reviewed and when to call the physician/911 reinforced. Tips for preventing the spread of infection discussed.   Advanced Directives:  -Group instruction provided by verbal instruction and written material to support subject matter. Instructor reviews Advanced Directive laws and proper instruction for filling out document.   Pulmonary Video:  -Group video education that reviews the importance of medication and oxygen compliance, exercise, good nutrition, pulmonary hygiene, and pursed lip and diaphragmatic breathing for the pulmonary  patient.   Exercise for the Pulmonary Patient:  -Group instruction that is supported by a PowerPoint presentation. Instructor discusses benefits of exercise, core components of exercise, frequency, duration, and intensity of an exercise routine, importance of utilizing pulse oximetry during exercise, safety while exercising, and options of places to exercise outside of rehab.     Pulmonary Medications:  -Verbally interactive group education provided by instructor with focus on inhaled medications and proper administration.   Anatomy and Physiology of the Respiratory System and Intimacy:  -Group instruction provided by PowerPoint, verbal discussion, and written material to support subject matter. Instructor reviews respiratory cycle and anatomical components of the respiratory system and their functions. Instructor also reviews differences in obstructive and restrictive respiratory diseases with examples of each. Intimacy, Sex, and Sexuality differences are reviewed with a discussion on how relationships can change when diagnosed with pulmonary disease. Common sexual concerns are reviewed.   MD DAY -A group question and answer session with a medical doctor that allows participants to ask questions that relate to their pulmonary disease state.   OTHER EDUCATION -Group or individual verbal, written, or video instructions that support the educational goals of the pulmonary rehab program.   PULMONARY REHAB OTHER RESPIRATORY from 10/20/2017 in [place]  Date  10/13/17  Educator  Lucianne Lei  Instruction Review Code  2- Demonstrated Understanding      Holiday Eating Survival Tips:  -Group instruction provided by PowerPoint slides, verbal discussion, and written materials to support subject matter. The instructor gives patients tips, tricks, and techniques to help them not only survive but enjoy the holidays despite the onslaught of food that accompanies the  holidays.   Knowledge Questionnaire Score: Knowledge Questionnaire Score - 10/03/17 1036      Knowledge Questionnaire Score   Pre Score  15/18       Core Components/Risk Factors/Patient Goals at Admission: Personal Goals and Risk Factors at Admission - 10/03/17 1037      Core Components/Risk Factors/Patient Goals on Admission   Improve shortness of breath with ADL's  Yes    Intervention  Provide education, individualized exercise plan and daily activity instruction to help decrease symptoms of SOB with activities of daily living.    Expected Outcomes  Short Term: Improve cardiorespiratory fitness to achieve a reduction of symptoms when performing ADLs;Long Term: Be able to perform more ADLs without symptoms or delay the onset of symptoms       Core Components/Risk Factors/Patient Goals Review:  Goals and Risk Factor Review    Row Name 10/24/17 1738             Core Components/Risk Factors/Patient Goals Review   Personal Goals Review  Improve shortness of breath with ADL's;Develop more efficient breathing techniques such as purse lipped breathing and diaphragmatic breathing and practicing self-pacing with activity.  Review  patient is doing well in pulmonary rehab. he states he is enjoying the program and feels he will benifit from the education and the exercise. he is learning PLB and pacing however he needs continuous cueing as this is not yet a learned technique. Expect to see progression towards pulmonary rehab goals over the next 30 days. Patient has only attended 4 sessions since admission and too early to evaluate progression towards goals.       Expected Outcomes  see admission expected outcomes.          Core Components/Risk Factors/Patient Goals at Discharge (Final Review):  Goals and Risk Factor Review - 10/24/17 1738      Core Components/Risk Factors/Patient Goals Review   Personal Goals Review  Improve shortness of breath with ADL's;Develop more efficient  breathing techniques such as purse lipped breathing and diaphragmatic breathing and practicing self-pacing with activity.    Review  patient is doing well in pulmonary rehab. he states he is enjoying the program and feels he will benifit from the education and the exercise. he is learning PLB and pacing however he needs continuous cueing as this is not yet a learned technique. Expect to see progression towards pulmonary rehab goals over the next 30 days. Patient has only attended 4 sessions since admission and too early to evaluate progression towards goals.    Expected Outcomes  see admission expected outcomes.       ITP Comments: ITP Comments    Row Name 10/24/17 1737           ITP Comments  Dr. Jennet Maduro, Medical Director          Comments: patient has attended 5 pulmonary rehab sessions since admission

## 2017-10-27 NOTE — Progress Notes (Signed)
Daily Session Note  Patient Details  Name: KERRIE LATOUR MRN: [id number] Date of Birth: May 18, 1928 Referring Provider:     Pulmonary Rehab Walk Test from 10/04/2017 in [place]  Referring Provider  Dr. Halford Chessman      Encounter Date: 10/27/2017  Check In: Session Check In - 10/27/17 1404      Check-In   Location  [facility]    Staff Present  Su Hilt, MS, ACSM RCEP, Exercise Physiologist;Portia Rollene Rotunda, RN, Roque Cash, RN    Supervising physician immediately available to respond to emergencies  [facility] immediately available    Physician(s)  Adnikari    Medication changes reported      No    Fall or balance concerns reported     No    Tobacco Cessation  No Change    Warm-up and Cool-down  Performed as group-led instruction    Resistance Training Performed  Yes    VAD Patient?  No      Pain Assessment   Currently in Pain?  No/denies    Multiple Pain Sites  No       Capillary Blood Glucose: No results found for this or any previous visit (from the past 24 hour(s)).    Social History   Tobacco Use  Smoking Status Former Smoker  . Packs/day: 1.00  . Years: 55.00  . Pack years: 55.00  . Types: Cigarettes  . Last attempt to quit: 04/22/2011  . Years since quitting: 6.5  Smokeless Tobacco Never Used  Tobacco Comment   occasionally smoked 2-3 times a week--1 cig each time    Goals Met:  Exercise tolerated well No report of cardiac concerns or symptoms Strength training completed today  Goals Unmet:  Not Applicable  Comments: Service time is from 1330 to 1545    Dr. Rush Farmer is Medical Director for Pulmonary Rehab at [facility].

## 2017-11-01 ENCOUNTER — Encounter (HOSPITAL_COMMUNITY)
Admission: RE | Admit: 2017-11-01 | Discharge: 2017-11-01 | Disposition: A | Discharge status: Home / Self Care | Payer: No Typology Code available for payment source | Source: Ambulatory Visit | Attending: Pulmonary Disease | Admitting: Pulmonary Disease

## 2017-11-01 DIAGNOSIS — J432 Centrilobular emphysema: Secondary | ICD-10-CM | POA: Diagnosis not present

## 2017-11-01 NOTE — Progress Notes (Signed)
Daily Session Note  Patient Details  Name: [name] MRN: [id number] Date of Birth: 07/25/1927 Referring Provider:     Pulmonary Rehab Walk Test from 10/04/2017 in [place]  Referring Provider  Dr. Halford Chessman      Encounter Date: 11/01/2017  Check In: Session Check In - 11/01/17 1540      Check-In   Location  [facility]    Staff Present  Cloyde Reams DiVincenzo, MS, ACSM RCEP, Exercise Physiologist;Wyat Infinger Ysidro Evert, RN;Carlette Clover, RN, Deland Pretty, MS, ACSM CEP, Exercise Physiologist    Supervising physician immediately available to respond to emergencies  [facility] immediately available    Physician(s)  Dr. Loleta Books    Medication changes reported      No    Fall or balance concerns reported     No    Tobacco Cessation  No Change    Warm-up and Cool-down  Performed as group-led instruction    Resistance Training Performed  Yes    VAD Patient?  No      Pain Assessment   Currently in Pain?  No/denies       Capillary Blood Glucose: No results found for this or any previous visit (from the past 24 hour(s)).  Exercise Prescription Changes - 11/01/17 1600      Response to Exercise   Blood Pressure (Admit)  118/50    Blood Pressure (Exercise)  120/60    Blood Pressure (Exit)  130/60    Heart Rate (Admit)  89 bpm    Heart Rate (Exercise)  108 bpm    Heart Rate (Exit)  87 bpm    Oxygen Saturation (Admit)  99 %    Oxygen Saturation (Exercise)  95 %    Oxygen Saturation (Exit)  97 %    Rating of Perceived Exertion (Exercise)  13    Perceived Dyspnea (Exercise)  3    Duration  Progress to 45 minutes of aerobic exercise without signs/symptoms of physical distress    Intensity  THRR unchanged      Resistance Training   Training Prescription  Yes    Weight  orange bands    Reps  10-15    Time  10 Minutes      Treadmill   MPH  1    Grade  0    Minutes  17      Bike   Level  2    Minutes  17      NuStep   Level  3    Minutes  17    METs  2.3       Social History   Tobacco Use  Smoking Status Former Smoker  . Packs/day: 1.00  . Years: 55.00  . Pack years: 55.00  . Types: Cigarettes  . Last attempt to quit: 04/22/2011  . Years since quitting: 6.5  Smokeless Tobacco Never Used  Tobacco Comment   occasionally smoked 2-3 times a week--1 cig each time    Goals Met:  Exercise tolerated well No report of cardiac concerns or symptoms Strength training completed today  Goals Unmet:  Not Applicable  Comments: Service time is from 1330 to 1500      Dr. Rush Farmer is Medical Director for Pulmonary Rehab at [facility].

## 2017-11-03 ENCOUNTER — Encounter (HOSPITAL_COMMUNITY): Payer: No Typology Code available for payment source

## 2017-11-08 ENCOUNTER — Encounter (HOSPITAL_COMMUNITY)
Admission: RE | Admit: 2017-11-08 | Discharge: 2017-11-08 | Disposition: A | Discharge status: Home / Self Care | Payer: No Typology Code available for payment source | Source: Ambulatory Visit | Attending: Pulmonary Disease | Admitting: Pulmonary Disease

## 2017-11-08 DIAGNOSIS — J432 Centrilobular emphysema: Secondary | ICD-10-CM | POA: Diagnosis not present

## 2017-11-08 NOTE — Progress Notes (Signed)
Daily Session Note  Patient Details  Name: [name] MRN: [id number] Date of Birth: 12-03-1927 Referring Provider:     Pulmonary Rehab Walk Test from 10/04/2017 in [place]  Referring Provider  Dr. Halford Chessman      Encounter Date: 11/08/2017  Check In: Session Check In - 11/08/17 1542      Check-In   Location  [facility]    Staff Present  Cloyde Reams DiVincenzo, MS, ACSM RCEP, Exercise Physiologist;Amandy Chubbuck Ysidro Evert, RN;Carlette Wilber Oliphant, RN, BSN    Supervising physician immediately available to respond to emergencies  [facility] immediately available    Physician(s)  Dr. Sarajane Jews    Medication changes reported      No    Fall or balance concerns reported     No    Tobacco Cessation  No Change    Warm-up and Cool-down  Performed as group-led instruction    Resistance Training Performed  Yes    VAD Patient?  No      Pain Assessment   Currently in Pain?  No/denies       Capillary Blood Glucose: No results found for this or any previous visit (from the past 24 hour(s)).    Social History   Tobacco Use  Smoking Status Former Smoker  . Packs/day: 1.00  . Years: 55.00  . Pack years: 55.00  . Types: Cigarettes  . Last attempt to quit: 04/22/2011  . Years since quitting: 6.5  Smokeless Tobacco Never Used  Tobacco Comment   occasionally smoked 2-3 times a week--1 cig each time    Goals Met:  Exercise tolerated well No report of cardiac concerns or symptoms Strength training completed today  Goals Unmet:  Not Applicable  Comments: Service time is from 1330 to 1515    Dr. Rush Farmer is Medical Director for Pulmonary Rehab at [facility].

## 2017-11-10 ENCOUNTER — Encounter (HOSPITAL_COMMUNITY)
Admission: RE | Admit: 2017-11-10 | Discharge: 2017-11-10 | Disposition: A | Discharge status: Home / Self Care | Payer: No Typology Code available for payment source | Source: Ambulatory Visit | Attending: Pulmonary Disease | Admitting: Pulmonary Disease

## 2017-11-10 DIAGNOSIS — J432 Centrilobular emphysema: Secondary | ICD-10-CM | POA: Diagnosis not present

## 2017-11-10 NOTE — Progress Notes (Signed)
Daily Session Note  Patient Details  Name: [name] MRN: [id number] Date of Birth: 12-Sep-1927 Referring Provider:     Pulmonary Rehab Walk Test from 10/04/2017 in [place]  Referring Provider  Dr. Halford Chessman      Encounter Date: 11/10/2017  Check In: Session Check In - 11/10/17 1600      Check-In   Location  [facility]    Staff Present  Cloyde Reams [name], MS, ACSM RCEP, Exercise Physiologist;Lisa Ysidro Evert, RN    Supervising physician immediately available to respond to emergencies  [facility] immediately available    Physician(s)       Medication changes reported      No       Capillary Blood Glucose: No results found for this or any previous visit (from the past 24 hour(s)).    Social History   Tobacco Use  Smoking Status Former Smoker  . Packs/day: 1.00  . Years: 55.00  . Pack years: 55.00  . Types: Cigarettes  . Last attempt to quit: 04/22/2011  . Years since quitting: 6.5  Smokeless Tobacco Never Used  Tobacco Comment   occasionally smoked 2-3 times a week--1 cig each time    Goals Met:  Exercise tolerated well No report of cardiac concerns or symptoms Strength training completed today  Goals Unmet:  Not Applicable  Comments: Service time is from 1:30p to 3:30p    Dr. Rush Farmer is Medical Director for Pulmonary Rehab at [facility].

## 2017-11-15 ENCOUNTER — Encounter (HOSPITAL_COMMUNITY)
Admission: RE | Admit: 2017-11-15 | Discharge: 2017-11-15 | Disposition: A | Discharge status: Home / Self Care | Payer: No Typology Code available for payment source | Source: Ambulatory Visit | Attending: Pulmonary Disease | Admitting: Pulmonary Disease

## 2017-11-15 DIAGNOSIS — J432 Centrilobular emphysema: Secondary | ICD-10-CM | POA: Diagnosis not present

## 2017-11-15 NOTE — Progress Notes (Signed)
I have reviewed a Home Exercise Prescription with [name] . [name] is not currently exercising at home.  The patient was advised to walk 2 days a week for 30-45 minutes.  [name] and I discussed how to progress their exercise prescription.  The patient stated that their goals were to decrease overall shortness of breath.  The patient stated that they understand the exercise prescription.  We reviewed exercise guidelines, target heart rate during exercise, RPE Scale, weather conditions, NTG use, endpoints for exercise, warmup and cool down.  Patient is encouraged to come to me with any questions. I will continue to follow up with the patient to assist them with progression and safety.

## 2017-11-15 NOTE — Progress Notes (Addendum)
[name] 84 y.o. male   DOB: 1927-09-16 MRN: [id number]          Nutrition 1. Centrilobular emphysema ([place])    Past Medical History:  Diagnosis Date  . Arthritis    osteoarthritis  . Cancer ([place])    skin cancer only.-no melanoma.  Marland Kitchen COPD (chronic obstructive pulmonary disease) ([place])   . History of kidney stones    lithotripsy x1   . History of oxygen administration    Oxygen @ 2 l/m nasally bedtime  . Hyperlipidemia   . Hypothyroidism   . Pulmonary nodule, right   . 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 Tobacco abuse    Quit 09/17/10   Meds reviewed. prednisone noted  Ht: Ht Readings from Last 1 Encounters:  10/03/17 5\' 8"  (1.727 m)     Wt:  Wt Readings from Last 3 Encounters:  10/18/17 178 lb 2.1 oz (80.8 kg)  10/03/17 174 lb 2.6 oz (79 kg)  08/31/17 179 lb (81.2 kg)     BMI: 27.09    Current tobacco use? No  Labs:  Lipid Panel 10/13/2017 Total cholesterol 138 Triglycerides   82 HDL    51 LDL    71  HgA1c    5.5 08/03/2014  Note Spoke with pt. There are some ways the pt can make his eating habits healthier. Age-appropriate nutrition recommendations discussed.  Pt's Rate Your Plate results reviewed with pt. Pt does not avoid salty food.  Pt adds salt to food. Pt reports "my wife tells me I should cut back on the salt." The role of sodium in lung disease reviewed with pt. Pt expressed understanding of the information reviewed via feedback method.    Nutrition Diagnosis ? Food-and nutrition-related knowledge deficit related to lack of exposure to information as related to diagnosis of pulmonary disease  Nutrition Intervention ? Recommend pt consider adding 600 mg Calcium supplement with vitamin D BID ? Pt's individual nutrition plan and goals reviewed with pt. ? Benefits of adopting healthy eating habits discussed when pt's Rate Your Plate reviewed.  Goal(s) 1. Describe the benefit of including fruits, vegetables, whole grains, and low-fat  dairy products in a healthy meal plan.  Plan:  Pt to attend Pulmonary Nutrition class Will provide client-centered nutrition education as part of interdisciplinary care.   Monitor and evaluate progress toward nutrition goal with team.  Monitor and Evaluate progress toward nutrition goal with team.   Derek Mound, M.Ed, RD, LDN, CDE 11/15/2017 12:13 PM

## 2017-11-15 NOTE — Progress Notes (Signed)
Daily Session Note  Patient Details  Name: [name] MRN: [id number] Date of Birth: May 10, 1928 Referring Provider:     Pulmonary Rehab Walk Test from 10/04/2017 in [place]  Referring Provider  Dr. Halford Chessman      Encounter Date: 11/15/2017  Check In: Session Check In - 11/15/17 1330      Check-In   Location  [facility]    Staff Present  Cloyde Reams DiVincenzo, MS, ACSM RCEP, Exercise Physiologist;Annedrea Stackhouse, RN, MHA;Carlette Wilber Oliphant, RN, BSN    Supervising physician immediately available to respond to emergencies  [facility] immediately available    Physician(s)  Dr. Wyline Copas    Medication changes reported      No    Fall or balance concerns reported     No    Tobacco Cessation  No Change    Warm-up and Cool-down  Performed as group-led instruction    Resistance Training Performed  Yes    VAD Patient?  No      Pain Assessment   Currently in Pain?  No/denies    Multiple Pain Sites  No       Capillary Blood Glucose: No results found for this or any previous visit (from the past 24 hour(s)).  Exercise Prescription Changes - 11/15/17 1600      Response to Exercise   Blood Pressure (Admit)  148/70    Blood Pressure (Exercise)  176/62    Blood Pressure (Exit)  136/64    Heart Rate (Admit)  81 bpm    Heart Rate (Exercise)  112 bpm    Heart Rate (Exit)  94 bpm    Oxygen Saturation (Admit)  97 %    Oxygen Saturation (Exercise)  91 %    Oxygen Saturation (Exit)  93 %    Rating of Perceived Exertion (Exercise)  12    Perceived Dyspnea (Exercise)  2    Duration  Progress to 45 minutes of aerobic exercise without signs/symptoms of physical distress    Intensity  THRR unchanged      Progression   Progression  Continue to progress workloads to maintain intensity without signs/symptoms of physical distress.      Resistance Training   Training Prescription  Yes    Weight  orange bands    Reps  10-15    Time  10  Minutes      Treadmill   MPH  1    Grade  0    Minutes  17      Bike   Level  2    Minutes  17      NuStep   Level  3    SPM  80    Minutes  17    METs  2.4       Social History   Tobacco Use  Smoking Status Former Smoker  . Packs/day: 1.00  . Years: 55.00  . Pack years: 55.00  . Types: Cigarettes  . Last attempt to quit: 04/22/2011  . Years since quitting: 6.5  Smokeless Tobacco Never Used  Tobacco Comment   occasionally smoked 2-3 times a week--1 cig each time    Goals Met:  Exercise tolerated well No report of cardiac concerns or symptoms Strength training completed today  Goals Unmet:  Not Applicable  Comments: Service time is from 1:30 to 3:00    Dr. Rush Farmer is Medical Director for Pulmonary Rehab at [facility].

## 2017-11-17 ENCOUNTER — Encounter (HOSPITAL_COMMUNITY)
Admission: RE | Admit: 2017-11-17 | Discharge: 2017-11-17 | Disposition: A | Discharge status: Home / Self Care | Payer: No Typology Code available for payment source | Source: Ambulatory Visit | Attending: Pulmonary Disease | Admitting: Pulmonary Disease

## 2017-11-17 DIAGNOSIS — J432 Centrilobular emphysema: Secondary | ICD-10-CM

## 2017-11-17 NOTE — Progress Notes (Addendum)
Daily Session Note  Patient Details  Name: [name] MRN: [id number] Date of Birth: [birth year] Referring Provider:     Pulmonary Rehab Walk Test from 10/04/2017 in [place]  Referring Provider  Dr. Halford Chessman      Encounter Date: 11/17/2017  Check In: Session Check In - 11/17/17 1356      Check-In   Location  [facility]    Staff Present  Su Hilt, MS, ACSM RCEP, Exercise Physiologist;Carlette Wilber Oliphant, RN, BSN    Supervising physician immediately available to respond to emergencies  [facility] immediately available    Physician(s)  Dr. Denton Brick    Medication changes reported      No    Fall or balance concerns reported     No    Tobacco Cessation  No Change    Warm-up and Cool-down  Performed as group-led instruction    Resistance Training Performed  Yes    VAD Patient?  No      Pain Assessment   Currently in Pain?  No/denies    Multiple Pain Sites  No       Capillary Blood Glucose: No results found for this or any previous visit (from the past 24 hour(s)).    Social History   Tobacco Use  Smoking Status Former Smoker  . Packs/day: 1.00  . Years: 55.00  . Pack years: 55.00  . Types: Cigarettes  . Last attempt to quit: 04/22/2011  . Years since quitting: 6.5  Smokeless Tobacco Never Used  Tobacco Comment   occasionally smoked 2-3 times a week--1 cig each time    Goals Met:  Exercise tolerated well No report of cardiac concerns or symptoms Strength training completed today  Goals Unmet:  Not Applicable  Comments: Service time is from 1:30 to 3:30    Dr. Rush Farmer is Medical Director for Pulmonary Rehab at [facility].

## 2017-11-22 ENCOUNTER — Encounter (HOSPITAL_COMMUNITY)
Admission: RE | Admit: 2017-11-22 | Discharge: 2017-11-22 | Disposition: A | Discharge status: Home / Self Care | Payer: No Typology Code available for payment source | Source: Ambulatory Visit | Attending: Pulmonary Disease | Admitting: Pulmonary Disease

## 2017-11-22 DIAGNOSIS — Z85828 Personal history of other malignant neoplasm of skin: Secondary | ICD-10-CM | POA: Insufficient documentation

## 2017-11-22 DIAGNOSIS — J432 Centrilobular emphysema: Secondary | ICD-10-CM | POA: Insufficient documentation

## 2017-11-22 DIAGNOSIS — Z87891 Personal history of nicotine dependence: Secondary | ICD-10-CM | POA: Insufficient documentation

## 2017-11-22 DIAGNOSIS — Z79899 Other long term (current) drug therapy: Secondary | ICD-10-CM | POA: Insufficient documentation

## 2017-11-22 DIAGNOSIS — E785 Hyperlipidemia, unspecified: Secondary | ICD-10-CM | POA: Insufficient documentation

## 2017-11-23 NOTE — Progress Notes (Signed)
Pulmonary Individual Treatment Plan  Patient Details  Name: [name] MRN: [id number] Date of Birth: 11-10-1927 Referring Provider:     Pulmonary Rehab Walk Test from 10/04/2017 in [place]  Referring Provider  Dr. Halford Chessman      Initial Encounter Date:    Pulmonary Rehab Walk Test from 10/04/2017 in [place]  Date  10/04/17  Referring Provider  Dr. Halford Chessman      Visit Diagnosis: Centrilobular emphysema ([place])  Patient's Home Medications on Admission:   Current Outpatient Medications:  .  albuterol (PROVENTIL HFA;VENTOLIN HFA) 108 (90 BASE) MCG/ACT inhaler, Inhale 2 puffs into the lungs every 4 (four) hours as needed for wheezing or shortness of breath., Disp: , Rfl:  .  albuterol (PROVENTIL) (2.5 MG/3ML) 0.083% nebulizer solution, USE 1 VIAL IN NEBULIZER EVERY 6 HOURS. Generic: VENTOLIN, Disp: 120 vial, Rfl: 10 .  guaiFENesin (MUCINEX) 600 MG 12 hr tablet, Take 2 tablets (1,200 mg total) by mouth 2 (two) times daily as needed. (Patient not taking: Reported on 10/03/2017), Disp: , Rfl:  .  levothyroxine (SYNTHROID, LEVOTHROID) 50 MCG tablet, Take 50 mcg by mouth daily., Disp: , Rfl:  .  predniSONE (DELTASONE) 5 MG tablet, Take 0.5 tablets (2.5 mg total) by mouth daily with breakfast. (Patient taking differently: Take 2.5 mg by mouth every other day. Taking 1/2 tablet every other day), Disp: 30 tablet, Rfl: 3 .  simvastatin (ZOCOR) 5 MG tablet, Take 5 mg by mouth at bedtime.  , Disp: , Rfl:  .  triamcinolone cream (KENALOG) 0.1 %, Apply topically., Disp: , Rfl:   Past Medical History: Past Medical History:  Diagnosis Date  . Arthritis    osteoarthritis  . Cancer ([place])    skin cancer only.-no melanoma.  Marland Kitchen COPD (chronic obstructive pulmonary disease) ([place])   . History of kidney stones    lithotripsy x1   . History of oxygen administration    Oxygen @ 2 l/m nasally bedtime  . Hyperlipidemia   . Hypothyroidism   .  Pulmonary nodule, right   . 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 Tobacco abuse    Quit 09/17/10    Tobacco Use: Social History   Tobacco Use  Smoking Status Former Smoker  . Packs/day: 1.00  . Years: 55.00  . Pack years: 55.00  . Types: Cigarettes  . Last attempt to quit: 04/22/2011  . Years since quitting: 6.5  Smokeless Tobacco Never Used  Tobacco Comment   occasionally smoked 2-3 times a week--1 cig each time    Labs: Recent Review Flowsheet Data    Labs for ITP Cardiac and Pulmonary Rehab Latest Ref Rng & Units 12/07/2006 12/08/2006 12/11/2006 06/03/2014   Cholestrol - - 114 ATP III CLASSIFICATION: <200     mg/dL   Desirable 200-239  mg/dL   Borderline High >=240    mg/dL   High - -   LDLCALC - - 75 Total Cholesterol/HDL:CHD Risk Coronary Heart Disease Risk Table Men   Women 1/2 Average Risk   3.4   3.3 - -   HDL - - 29(L) - -   Trlycerides - - 48 - -   Hemoglobin A1c <5.7 % - - - 6.5(H)   PHART - - 7.433 7.430 -   PCO2ART - - 32.9(L) 38.0 -   HCO3 - 21.1 21.6 24.8(H) -   TCO2 - 22 22.6 25.9 -   ACIDBASEDEF - 2.0 2.0 - -  O2SAT - - 93.8 94.5 -      Capillary Blood Glucose: No results found for: GLUCAP   Pulmonary Assessment Scores: Pulmonary Assessment Scores    Row Name 10/06/17 1310 10/06/17 1642       ADL UCSD   ADL Phase  Entry  Entry    SOB Score total  -  63      CAT Score   CAT Score  -  17 Entry      mMRC Score   mMRC Score  3  -       Pulmonary Function Assessment: Pulmonary Function Assessment - 10/03/17 1037      Breath   Bilateral Breath Sounds  Decreased    Shortness of Breath  Limiting activity;Yes       Exercise Target Goals:    Exercise Program Goal: Individual exercise prescription set using results from initial 6 min walk test and THRR while considering  patient's activity barriers and safety.    Exercise Prescription Goal: Initial exercise prescription builds to 30-45 minutes a day of aerobic  activity, 2-3 days per week.  Home exercise guidelines will be given to patient during program as part of exercise prescription that the participant will acknowledge.  Activity Barriers & Risk Stratification: Activity Barriers & Cardiac Risk Stratification - 10/03/17 1027      Activity Barriers & Cardiac Risk Stratification   Activity Barriers  Left Hip Replacement;Deconditioning;Shortness of Breath       6 Minute Walk: 6 Minute Walk    Row Name 10/06/17 1308         6 Minute Walk   Phase  Initial     Distance  800 feet     Walk Time  6 minutes     # of Rest Breaks  - 2 (50 seconds total)     MPH  1.5     METS  2.15     RPE  13     Perceived Dyspnea   3     Symptoms  No     Resting HR  97 bpm     Resting BP  133/77     Resting Oxygen Saturation   97 %     Exercise Oxygen Saturation  during 6 min walk  94 %     Max Ex. HR  125 bpm     Max Ex. BP  164/76       Interval HR   1 Minute HR  107     2 Minute HR  125     3 Minute HR  116     4 Minute HR  117     5 Minute HR  120     6 Minute HR  120     2 Minute Post HR  114     Interval Heart Rate?  Yes       Interval Oxygen   Interval Oxygen?  Yes     Baseline Oxygen Saturation %  97 %     1 Minute Oxygen Saturation %  97 %     1 Minute Liters of Oxygen  0 L     2 Minute Oxygen Saturation %  95 %     2 Minute Liters of Oxygen  0 L     3 Minute Oxygen Saturation %  96 %     3 Minute Liters of Oxygen  0 L     4 Minute Oxygen Saturation %  95 %  4 Minute Liters of Oxygen  0 L     5 Minute Oxygen Saturation %  94 %     5 Minute Liters of Oxygen  0 L     6 Minute Oxygen Saturation %  94 %     6 Minute Liters of Oxygen  0 L     2 Minute Post Oxygen Saturation %  98 %     2 Minute Post Liters of Oxygen  0 L        Oxygen Initial Assessment: Oxygen Initial Assessment - 10/06/17 1310      Initial 6 min Walk   Oxygen Used  None      Program Oxygen Prescription   Program Oxygen Prescription  None        Oxygen Re-Evaluation: Oxygen Re-Evaluation    Row Name 10/25/17 0728 11/22/17 0657           Program Oxygen Prescription   Program Oxygen Prescription  None  None        Home Oxygen   Home Oxygen Device  Home Concentrator  Home Concentrator      Sleep Oxygen Prescription  Continuous  Continuous      Liters per minute  2  2      Home Exercise Oxygen Prescription  None  None      Home at Rest Exercise Oxygen Prescription  None  None      Compliance with Home Oxygen Use  Yes  Yes        Goals/Expected Outcomes   Short Term Goals  To learn and exhibit compliance with exercise, home and travel O2 prescription;To learn and understand importance of monitoring SPO2 with pulse oximeter and demonstrate accurate use of the pulse oximeter.;To learn and understand importance of maintaining oxygen saturations>88%;To learn and demonstrate proper pursed lip breathing techniques or other breathing techniques.;To learn and demonstrate proper use of respiratory medications  To learn and exhibit compliance with exercise, home and travel O2 prescription;To learn and understand importance of monitoring SPO2 with pulse oximeter and demonstrate accurate use of the pulse oximeter.;To learn and understand importance of maintaining oxygen saturations>88%;To learn and demonstrate proper pursed lip breathing techniques or other breathing techniques.;To learn and demonstrate proper use of respiratory medications      Long  Term Goals  Exhibits compliance with exercise, home and travel O2 prescription;Verbalizes importance of monitoring SPO2 with pulse oximeter and return demonstration;Maintenance of O2 saturations>88%;Exhibits proper breathing techniques, such as pursed lip breathing or other method taught during program session;Compliance with respiratory medication;Demonstrates proper use of MDI's  Exhibits compliance with exercise, home and travel O2 prescription;Verbalizes importance of monitoring SPO2 with  pulse oximeter and return demonstration;Maintenance of O2 saturations>88%;Exhibits proper breathing techniques, such as pursed lip breathing or other method taught during program session;Compliance with respiratory medication;Demonstrates proper use of MDI's      Goals/Expected Outcomes  Patient is compliant  Patient is compliant         Oxygen Discharge (Final Oxygen Re-Evaluation): Oxygen Re-Evaluation - 11/22/17 0657      Program Oxygen Prescription   Program Oxygen Prescription  None      Home Oxygen   Home Oxygen Device  Home Concentrator    Sleep Oxygen Prescription  Continuous    Liters per minute  2    Home Exercise Oxygen Prescription  None    Home at Rest Exercise Oxygen Prescription  None    Compliance with Home Oxygen Use  Yes  Goals/Expected Outcomes   Short Term Goals  To learn and exhibit compliance with exercise, home and travel O2 prescription;To learn and understand importance of monitoring SPO2 with pulse oximeter and demonstrate accurate use of the pulse oximeter.;To learn and understand importance of maintaining oxygen saturations>88%;To learn and demonstrate proper pursed lip breathing techniques or other breathing techniques.;To learn and demonstrate proper use of respiratory medications    Long  Term Goals  Exhibits compliance with exercise, home and travel O2 prescription;Verbalizes importance of monitoring SPO2 with pulse oximeter and return demonstration;Maintenance of O2 saturations>88%;Exhibits proper breathing techniques, such as pursed lip breathing or other method taught during program session;Compliance with respiratory medication;Demonstrates proper use of MDI's    Goals/Expected Outcomes  Patient is compliant       Initial Exercise Prescription: Initial Exercise Prescription - 10/06/17 1300      Date of Initial Exercise RX and Referring Provider   Date  10/04/17    Referring Provider  Dr. Halford Chessman      Treadmill   MPH  1    Grade  0    Minutes   17      Bike   Level  0.4    Minutes  17      NuStep   Level  2    SPM  80    Minutes  17    METs  1.5      Prescription Details   Frequency (times per week)  2    Duration  Progress to 45 minutes of aerobic exercise without signs/symptoms of physical distress      Intensity   THRR 40-80% of Max Heartrate  53-106    Ratings of Perceived Exertion  11-13    Perceived Dyspnea  0-4      Progression   Progression  Continue progressive overload as per policy without signs/symptoms or physical distress.      Resistance Training   Training Prescription  Yes    Weight  orange    Reps  10-15       Perform Capillary Blood Glucose checks as needed.  Exercise Prescription Changes:  Exercise Prescription Changes    Row Name 10/18/17 1538 11/01/17 1600 11/10/17 0818 11/15/17 1600       Response to Exercise   Blood Pressure (Admit)  130/50  118/50  -  148/70    Blood Pressure (Exercise)  104/50  120/60  -  176/62    Blood Pressure (Exit)  136/60  130/60  -  136/64    Heart Rate (Admit)  75 bpm  89 bpm  -  81 bpm    Heart Rate (Exercise)  102 bpm  108 bpm  -  112 bpm    Heart Rate (Exit)  86 bpm  87 bpm  -  94 bpm    Oxygen Saturation (Admit)  99 %  99 %  -  97 %    Oxygen Saturation (Exercise)  98 %  95 %  -  91 %    Oxygen Saturation (Exit)  100 %  97 %  -  93 %    Rating of Perceived Exertion (Exercise)  13  13  -  12    Perceived Dyspnea (Exercise)  2  3  -  2    Duration  Progress to 45 minutes of aerobic exercise without signs/symptoms of physical distress  Progress to 45 minutes of aerobic exercise without signs/symptoms of physical distress  -  Progress to 45 minutes  of aerobic exercise without signs/symptoms of physical distress    Intensity  THRR unchanged 40-80% HRR  THRR unchanged  -  THRR unchanged      Progression   Progression  -  -  -  Continue to progress workloads to maintain intensity without signs/symptoms of physical distress.      Resistance Training    Training Prescription  Yes  Yes  -  Yes    Weight  orange  orange bands  -  orange bands    Reps  10-15  10-15  -  10-15    Time  -  10 Minutes  -  10 Minutes      Treadmill   MPH  1  1  -  1    Grade  0  0  -  0    Minutes  17  17  -  17      Bike   Level  1 recumbent bike  2  -  2    Minutes  17  17  -  17      NuStep   Level  2  3  -  3    SPM  80  -  -  80    Minutes  17  17  -  17    METs  2.2  2.3  -  2.4      Home Exercise Plan   Plans to continue exercise at  -  -  [facility] (comment) [place]  -    Frequency  -  -  Add 2 additional days to program exercise sessions.  -       Exercise Comments:  Exercise Comments    Row Name 11/15/17 0817           Exercise Comments  Home exercise has been completed          Exercise Goals and Review:  Exercise Goals    Row Name 10/03/17 1028             Exercise Goals   Increase Physical Activity  Yes       Intervention  Provide advice, education, support and counseling about physical activity/exercise needs.;Develop an individualized exercise prescription for aerobic and resistive training based on initial evaluation findings, risk stratification, comorbidities and participant's personal goals.       Expected Outcomes  Short Term: Attend rehab on a regular basis to increase amount of physical activity.;Long Term: Exercising regularly at least 3-5 days a week.;Long Term: Add in home exercise to make exercise part of routine and to increase amount of physical activity.       Increase Strength and Stamina  Yes       Intervention  Provide advice, education, support and counseling about physical activity/exercise needs.;Develop an individualized exercise prescription for aerobic and resistive training based on initial evaluation findings, risk stratification, comorbidities and participant's personal goals.       Expected Outcomes  Short Term: Increase workloads from initial exercise prescription for resistance,  speed, and METs.;Short Term: Perform resistance training exercises routinely during rehab and add in resistance training at home;Long Term: Improve cardiorespiratory fitness, muscular endurance and strength as measured by increased METs and functional capacity (6MWT)       Able to understand and use rate of perceived exertion (RPE) scale  Yes       Intervention  Provide education and explanation on how to use RPE scale  Expected Outcomes  Short Term: Able to use RPE daily in rehab to express subjective intensity level;Long Term:  Able to use RPE to guide intensity level when exercising independently       Able to understand and use Dyspnea scale  Yes       Intervention  Provide education and explanation on how to use Dyspnea scale       Expected Outcomes  Short Term: Able to use Dyspnea scale daily in rehab to express subjective sense of shortness of breath during exertion;Long Term: Able to use Dyspnea scale to guide intensity level when exercising independently       Knowledge and understanding of Target Heart Rate Range (THRR)  Yes       Intervention  Provide education and explanation of THRR including how the numbers were predicted and where they are located for reference       Expected Outcomes  Short Term: Able to state/look up THRR;Short Term: Able to use daily as guideline for intensity in rehab;Long Term: Able to use THRR to govern intensity when exercising independently       Understanding of Exercise Prescription  Yes       Intervention  Provide education, explanation, and written materials on patient's individual exercise prescription       Expected Outcomes  Short Term: Able to explain program exercise prescription;Long Term: Able to explain home exercise prescription to exercise independently          Exercise Goals Re-Evaluation : Exercise Goals Re-Evaluation    Row Name 10/25/17 0728 11/22/17 0657           Exercise Goal Re-Evaluation   Exercise Goals Review  Increase  Physical Activity;Able to understand and use rate of perceived exertion (RPE) scale;Knowledge and understanding of Target Heart Rate Range (THRR);Understanding of Exercise Prescription;Increase Strength and Stamina;Able to understand and use Dyspnea scale  Increase Physical Activity;Able to understand and use rate of perceived exertion (RPE) scale;Knowledge and understanding of Target Heart Rate Range (THRR);Understanding of Exercise Prescription;Increase Strength and Stamina;Able to understand and use Dyspnea scale      Comments  Patient has only attended 4 rehab sessions but is performing well. Limited by shortness of breath. Will cont to monitor and motivate as tolerated.   Patient's progress is slow due to his severe deconditioning. We have discussed home exercise but patient is reluctant due to his shortness of breath with even simple activities. Progress in program is going to be slow but patient's attendance is consistent and he is motivated to make changes. Will cont to monitor and progress when he is able.      Expected Outcomes  Through exercise at rehab and at home, patient will increase physical capacity and be able to carry out ADL's with ease at home. Patient will also gain the confidence and knowledge to adhere to an exercise regime at home.  Through exercise at rehab and at home, the patient will decrease shortness of breath with daily activities and feel confident in carrying out an exercise regime at home.          Discharge Exercise Prescription (Final Exercise Prescription Changes): Exercise Prescription Changes - 11/15/17 1600      Response to Exercise   Blood Pressure (Admit)  148/70    Blood Pressure (Exercise)  176/62    Blood Pressure (Exit)  136/64    Heart Rate (Admit)  81 bpm    Heart Rate (Exercise)  112 bpm    Heart Rate (  Exit)  94 bpm    Oxygen Saturation (Admit)  97 %    Oxygen Saturation (Exercise)  91 %    Oxygen Saturation (Exit)  93 %    Rating of Perceived  Exertion (Exercise)  12    Perceived Dyspnea (Exercise)  2    Duration  Progress to 45 minutes of aerobic exercise without signs/symptoms of physical distress    Intensity  THRR unchanged      Progression   Progression  Continue to progress workloads to maintain intensity without signs/symptoms of physical distress.      Resistance Training   Training Prescription  Yes    Weight  orange bands    Reps  10-15    Time  10 Minutes      Treadmill   MPH  1    Grade  0    Minutes  17      Bike   Level  2    Minutes  17      NuStep   Level  3    SPM  80    Minutes  17    METs  2.4       Nutrition:  Target Goals: Understanding of nutrition guidelines, daily intake of sodium <1546m, cholesterol <2049m calories 30% from fat and 7% or less from saturated fats, daily to have 5 or more servings of fruits and vegetables.  Biometrics: Pre Biometrics - 10/03/17 1039      Pre Biometrics   Grip Strength  38 kg        Nutrition Therapy Plan and Nutrition Goals:   Nutrition Assessments:   Nutrition Goals Re-Evaluation:   Nutrition Goals Discharge (Final Nutrition Goals Re-Evaluation):   Psychosocial: Target Goals: Acknowledge presence or absence of significant depression and/or stress, maximize coping skills, provide positive support system. Participant is able to verbalize types and ability to use techniques and skills needed for reducing stress and depression.  Initial Review & Psychosocial Screening: Initial Psych Review & Screening - 10/03/17 1037      Initial Review   Current issues with  None Identified      Family Dynamics   Good Support System?  Yes      Barriers   Psychosocial barriers to participate in program  There are no identifiable barriers or psychosocial needs.      Screening Interventions   Interventions  Encouraged to exercise       Quality of Life Scores:  Scores of 19 and below usually indicate a poorer quality of life in these areas.  A  difference of  2-3 points is a clinically meaningful difference.  A difference of 2-3 points in the total score of the Quality of Life Index has been associated with significant improvement in overall quality of life, self-image, physical symptoms, and general health in studies assessing change in quality of life.   PHQ-9: Recent Review Flowsheet Data    [facility] 01/05/2017   Decreased Interest 0 0   Down, Depressed, Hopeless 0 0   PHQ - 2 Score 0 0     Interpretation of Total Score  Total Score Depression Severity:  1-4 = Minimal depression, 5-9 = Mild depression, 10-14 = Moderate depression, 15-19 = Moderately severe depression, 20-27 = Severe depression   Psychosocial Evaluation and Intervention: Psychosocial Evaluation - 11/23/17 1317      Psychosocial Evaluation & Interventions   Interventions  Encouraged to exercise with the program and follow exercise prescription  Comments  Pt demonstrates an upbeat and pleasant attitude.  Pt interacts well with staff and fellow participants.    Expected Outcomes  patient will remain free from psychosocial barriers to participation in pulmonary rehab.    Continue Psychosocial Services   Follow up required by staff       Psychosocial Re-Evaluation: Psychosocial Re-Evaluation    [place] Name 10/24/17 1741             Psychosocial Re-Evaluation   Current issues with  None Identified       Expected Outcomes  patient will remain free from psychosocial barriers to participation in pulmonary rehab       Continue Psychosocial Services   Follow up required by staff          Psychosocial Discharge (Final Psychosocial Re-Evaluation): Psychosocial Re-Evaluation - 10/24/17 1741      Psychosocial Re-Evaluation   Current issues with  None Identified    Expected Outcomes  patient will remain free from psychosocial barriers to participation in pulmonary rehab    Continue Psychosocial Services   Follow up required by staff        Education: Education Goals: Education classes will be provided on a weekly basis, covering required topics. Participant will state understanding/return demonstration of topics presented.  Learning Barriers/Preferences: Learning Barriers/Preferences - 10/03/17 1036      Learning Barriers/Preferences   Learning Barriers  None    Learning Preferences  Written Material       Education Topics: Risk Factor Reduction:  -Group instruction that is supported by a PowerPoint presentation. Instructor discusses the definition of a risk factor, different risk factors for pulmonary disease, and how the heart and lungs work together.     Nutrition for Pulmonary Patient:  -Group instruction provided by PowerPoint slides, verbal discussion, and written materials to support subject matter. The instructor gives an explanation and review of healthy diet recommendations, which includes a discussion on weight management, recommendations for fruit and vegetable consumption, as well as protein, fluid, caffeine, fiber, sodium, sugar, and alcohol. Tips for eating when patients are short of breath are discussed.   Pursed Lip Breathing:  -Group instruction that is supported by demonstration and informational handouts. Instructor discusses the benefits of pursed lip and diaphragmatic breathing and detailed demonstration on how to preform both.     Oxygen Safety:  -Group instruction provided by PowerPoint, verbal discussion, and written material to support subject matter. There is an overview of "What is Oxygen" and "Why do we need it".  Instructor also reviews how to create a safe environment for oxygen use, the importance of using oxygen as prescribed, and the risks of noncompliance. There is a brief discussion on traveling with oxygen and resources the patient may utilize.   Oxygen Equipment:  -Group instruction provided by [facility] Staff utilizing handouts, written materials, and equipment  demonstrations.   PULMONARY REHAB OTHER RESPIRATORY from 11/10/2017 in [place]  Date  10/20/17  Educator  george with [employer]  Instruction Review Code  1- Verbalizes Understanding      Signs and Symptoms:  -Group instruction provided by written material and verbal discussion to support subject matter. Warning signs and symptoms of infection, stroke, and heart attack are reviewed and when to call the physician/911 reinforced. Tips for preventing the spread of infection discussed.   Advanced Directives:  -Group instruction provided by verbal instruction and written material to support subject matter. Instructor reviews Advanced Directive laws and proper instruction for  filling out document.   Pulmonary Video:  -Group video education that reviews the importance of medication and oxygen compliance, exercise, good nutrition, pulmonary hygiene, and pursed lip and diaphragmatic breathing for the pulmonary patient.   Exercise for the Pulmonary Patient:  -Group instruction that is supported by a PowerPoint presentation. Instructor discusses benefits of exercise, core components of exercise, frequency, duration, and intensity of an exercise routine, importance of utilizing pulse oximetry during exercise, safety while exercising, and options of places to exercise outside of rehab.     PULMONARY REHAB OTHER RESPIRATORY from 11/10/2017 in [place]  Date  10/27/17  Educator  Cloyde Reams  Instruction Review Code  1- Verbalizes Understanding      Pulmonary Medications:  -Verbally interactive group education provided by instructor with focus on inhaled medications and proper administration.   Anatomy and Physiology of the Respiratory System and Intimacy:  -Group instruction provided by PowerPoint, verbal discussion, and written material to support subject matter. Instructor reviews respiratory cycle and anatomical components of the  respiratory system and their functions. Instructor also reviews differences in obstructive and restrictive respiratory diseases with examples of each. Intimacy, Sex, and Sexuality differences are reviewed with a discussion on how relationships can change when diagnosed with pulmonary disease. Common sexual concerns are reviewed.   MD DAY -A group question and answer session with a medical doctor that allows participants to ask questions that relate to their pulmonary disease state.   OTHER EDUCATION -Group or individual verbal, written, or video instructions that support the educational goals of the pulmonary rehab program.   PULMONARY REHAB OTHER RESPIRATORY from 11/10/2017 in [place]  Date  10/13/17  Educator  Lucianne Lei  Instruction Review Code  2- Demonstrated Understanding      Holiday Eating Survival Tips:  -Group instruction provided by PowerPoint slides, verbal discussion, and written materials to support subject matter. The instructor gives patients tips, tricks, and techniques to help them not only survive but enjoy the holidays despite the onslaught of food that accompanies the holidays.   Knowledge Questionnaire Score: Knowledge Questionnaire Score - 10/03/17 1036      Knowledge Questionnaire Score   Pre Score  15/18       Core Components/Risk Factors/Patient Goals at Admission: Personal Goals and Risk Factors at Admission - 10/03/17 1037      Core Components/Risk Factors/Patient Goals on Admission   Improve shortness of breath with ADL's  Yes    Intervention  Provide education, individualized exercise plan and daily activity instruction to help decrease symptoms of SOB with activities of daily living.    Expected Outcomes  Short Term: Improve cardiorespiratory fitness to achieve a reduction of symptoms when performing ADLs;Long Term: Be able to perform more ADLs without symptoms or delay the onset of symptoms       Core  Components/Risk Factors/Patient Goals Review:  Goals and Risk Factor Review    Row Name 10/24/17 1738 11/23/17 1316           Core Components/Risk Factors/Patient Goals Review   Personal Goals Review  Improve shortness of breath with ADL's;Develop more efficient breathing techniques such as purse lipped breathing and diaphragmatic breathing and practicing self-pacing with activity.  Improve shortness of breath with ADL's;Develop more efficient breathing techniques such as purse lipped breathing and diaphragmatic breathing and practicing self-pacing with activity.      Review  patient is doing well in pulmonary rehab. he states he is enjoying the  program and feels he will benifit from the education and the exercise. he is learning PLB and pacing however he needs continuous cueing as this is not yet a learned technique. Expect to see progression towards pulmonary rehab goals over the next 30 days. Patient has only attended 4 sessions since admission and too early to evaluate progression towards goals.  patient is doing well in pulmonary rehab. he states he is enjoying the program and feels he will benifit from the education and the exercise. he is learning PLB and pacing however he needs some verbal cueing as this is not yet a learned technique. Expect to see progression towards pulmonary rehab goals over the next 30 days.       Expected Outcomes  see admission expected outcomes.  see admission expected outcomes.         Core Components/Risk Factors/Patient Goals at Discharge (Final Review):  Goals and Risk Factor Review - 11/23/17 1316      Core Components/Risk Factors/Patient Goals Review   Personal Goals Review  Improve shortness of breath with ADL's;Develop more efficient breathing techniques such as purse lipped breathing and diaphragmatic breathing and practicing self-pacing with activity.    Review  patient is doing well in pulmonary rehab. he states he is enjoying the program and feels he  will benifit from the education and the exercise. he is learning PLB and pacing however he needs some verbal cueing as this is not yet a learned technique. Expect to see progression towards pulmonary rehab goals over the next 30 days.     Expected Outcomes  see admission expected outcomes.       ITP Comments: ITP Comments    Row Name 10/24/17 1737 11/23/17 1316         ITP Comments  Dr. Jennet Maduro, Medical Director  Dr. Jennet Maduro, Medical Director         Comments: Pt attended 11 exercise sessions. Cherre Huger, BSN Cardiac and Training and development officer

## 2017-11-24 ENCOUNTER — Encounter (HOSPITAL_COMMUNITY)
Admission: RE | Admit: 2017-11-24 | Discharge: 2017-11-24 | Disposition: A | Discharge status: Home / Self Care | Payer: No Typology Code available for payment source | Source: Ambulatory Visit | Attending: Pulmonary Disease | Admitting: Pulmonary Disease

## 2017-11-24 DIAGNOSIS — Z87891 Personal history of nicotine dependence: Secondary | ICD-10-CM | POA: Diagnosis not present

## 2017-11-24 DIAGNOSIS — J432 Centrilobular emphysema: Secondary | ICD-10-CM

## 2017-11-24 DIAGNOSIS — Z85828 Personal history of other malignant neoplasm of skin: Secondary | ICD-10-CM | POA: Diagnosis not present

## 2017-11-24 DIAGNOSIS — E785 Hyperlipidemia, unspecified: Secondary | ICD-10-CM | POA: Diagnosis not present

## 2017-11-24 DIAGNOSIS — Z79899 Other long term (current) drug therapy: Secondary | ICD-10-CM | POA: Diagnosis not present

## 2017-11-24 NOTE — Progress Notes (Signed)
Daily Session Note  Patient Details  Name: [name] MRN: [id number] Date of Birth: [birth year] Referring Provider:     Pulmonary Rehab Walk Test from 10/04/2017 in [place]  Referring Provider  Dr. Halford Chessman      Encounter Date: 11/24/2017  Check In: Session Check In - 11/24/17 1353      Check-In   Location  [facility]    Staff Present  Maurice Small, RN, BSN;Rubie Ficco, RN;Molly DiVincenzo, MS, ACSM RCEP, Exercise Physiologist;Annedrea Rosezella [place], RN, [facility]    Supervising physician immediately available to respond to emergencies  [facility] immediately available    Physician(s)  Dr. Tana Coast    Medication changes reported      No    Fall or balance concerns reported     No    Tobacco Cessation  No Change    Warm-up and Cool-down  Performed as group-led instruction    Resistance Training Performed  Yes    VAD Patient?  No      Pain Assessment   Currently in Pain?  No/denies    Multiple Pain Sites  No       Capillary Blood Glucose: No results found for this or any previous visit (from the past 24 hour(s)).    Social History   Tobacco Use  Smoking Status Former Smoker  . Packs/day: 1.00  . Years: 55.00  . Pack years: 55.00  . Types: Cigarettes  . Last attempt to quit: 04/22/2011  . Years since quitting: 6.5  Smokeless Tobacco Never Used  Tobacco Comment   occasionally smoked 2-3 times a week--1 cig each time    Goals Met:  Exercise tolerated well No report of cardiac concerns or symptoms Strength training completed today  Goals Unmet:  Not Applicable  Comments: Service time is from 1330 to 1505    Dr. Rush Farmer is Medical Director for Pulmonary Rehab at [facility].

## 2017-11-29 ENCOUNTER — Encounter (HOSPITAL_COMMUNITY)
Admission: RE | Admit: 2017-11-29 | Discharge: 2017-11-29 | Disposition: A | Discharge status: Home / Self Care | Payer: No Typology Code available for payment source | Source: Ambulatory Visit | Attending: Pulmonary Disease | Admitting: Pulmonary Disease

## 2017-11-29 VITALS — Wt 179.5 lb

## 2017-11-29 DIAGNOSIS — J432 Centrilobular emphysema: Secondary | ICD-10-CM

## 2017-11-29 NOTE — Progress Notes (Signed)
Daily Session Note  Patient Details  Name: LAYMOND POSTLE MRN: [id number] Date of Birth: 1928-03-09 Referring Provider:     Pulmonary Rehab Walk Test from 10/04/2017 in [place]  Referring Provider  Dr. Halford Chessman      Encounter Date: 11/29/2017  Check In: Session Check In - 11/29/17 1330      Check-In   Location  [facility]    Staff Present  Rosebud Poles, RN, BSN;Molly DiVincenzo, MS, ACSM RCEP, Exercise Physiologist;Lisa Ysidro Evert, Felipe Drone, RN, MHA;Olinty Celesta Aver, MS, ACSM CEP, Exercise Physiologist;Carlette Wilber Oliphant, RN, BSN    Supervising physician immediately available to respond to emergencies  [facility] immediately available    Physician(s)  Dr. Tana Coast    Medication changes reported      No    Fall or balance concerns reported     No    Tobacco Cessation  No Change    Warm-up and Cool-down  Performed as group-led instruction    Resistance Training Performed  Yes    VAD Patient?  No      Pain Assessment   Currently in Pain?  No/denies    Multiple Pain Sites  No       Capillary Blood Glucose: No results found for this or any previous visit (from the past 24 hour(s)).  Exercise Prescription Changes - 11/29/17 1500      Response to Exercise   Blood Pressure (Admit)  144/58    Blood Pressure (Exercise)  130/70    Blood Pressure (Exit)  134/70    Heart Rate (Admit)  77 bpm    Heart Rate (Exercise)  93 bpm    Heart Rate (Exit)  82 bpm    Oxygen Saturation (Admit)  99 %    Oxygen Saturation (Exercise)  100 %    Oxygen Saturation (Exit)  98 %    Rating of Perceived Exertion (Exercise)  12    Perceived Dyspnea (Exercise)  2    Duration  Progress to 45 minutes of aerobic exercise without signs/symptoms of physical distress    Intensity  THRR unchanged      Progression   Progression  Continue to progress workloads to maintain intensity without signs/symptoms of physical distress.      Resistance Training    Training Prescription  Yes    Weight  orange bands    Reps  10-15    Time  10 Minutes      Interval Training   Interval Training  No      Treadmill   MPH  1    Grade  0    Minutes  17      Bike   Level  3    Minutes  17      NuStep   Level  4    SPM  80    Minutes  17    METs  2.6       Social History   Tobacco Use  Smoking Status Former Smoker  . Packs/day: 1.00  . Years: 55.00  . Pack years: 55.00  . Types: Cigarettes  . Last attempt to quit: 04/22/2011  . Years since quitting: 6.6  Smokeless Tobacco Never Used  Tobacco Comment   occasionally smoked 2-3 times a week--1 cig each time    Goals Met:  Exercise tolerated well Strength training completed today  Goals Unmet:  Not Applicable  Comments: Service time is from 1330 to 1500    Dr.  Rush Farmer is Medical Director for Pulmonary Rehab at [facility].

## 2017-12-01 ENCOUNTER — Encounter (HOSPITAL_COMMUNITY)
Admission: RE | Admit: 2017-12-01 | Discharge: 2017-12-01 | Disposition: A | Discharge status: Home / Self Care | Payer: No Typology Code available for payment source | Source: Ambulatory Visit | Attending: Pulmonary Disease | Admitting: Pulmonary Disease

## 2017-12-01 DIAGNOSIS — J432 Centrilobular emphysema: Secondary | ICD-10-CM | POA: Diagnosis not present

## 2017-12-01 NOTE — Progress Notes (Signed)
Daily Session Note  Patient Details  Name: [name] MRN: [id number] Date of Birth: 07/26/1927 Referring Provider:     Pulmonary Rehab Walk Test from 10/04/2017 in [place]  Referring Provider  Dr. Halford Chessman      Encounter Date: 12/01/2017  Check In: Session Check In - 12/01/17 1330      Check-In   Location  [facility]    Staff Present  Rosebud Poles, RN, BSN;Molly DiVincenzo, MS, ACSM RCEP, Exercise Physiologist;Lisa Ysidro Evert, RN;Carlette Wilber Oliphant, RN, BSN;Ramon Dredge, RN, [facility]    Supervising physician immediately available to respond to emergencies  [facility] immediately available    Physician(s)  Dr. Broadus John    Medication changes reported      No    Fall or balance concerns reported     No    Tobacco Cessation  No Change    Warm-up and Cool-down  Performed as group-led instruction    Resistance Training Performed  Yes    VAD Patient?  No      Pain Assessment   Currently in Pain?  No/denies       Capillary Blood Glucose: No results found for this or any previous visit (from the past 24 hour(s)).    Social History   Tobacco Use  Smoking Status Former Smoker  . Packs/day: 1.00  . Years: 55.00  . Pack years: 55.00  . Types: Cigarettes  . Last attempt to quit: 04/22/2011  . Years since quitting: 6.6  Smokeless Tobacco Never Used  Tobacco Comment   occasionally smoked 2-3 times a week--1 cig each time    Goals Met:  Exercise tolerated well Strength training completed today  Goals Unmet:  Not Applicable  Comments: Service time is from 1330 to 1515.    Dr. Rush Farmer is Medical Director for Pulmonary Rehab at [facility].

## 2017-12-06 ENCOUNTER — Encounter (HOSPITAL_COMMUNITY)
Admission: RE | Admit: 2017-12-06 | Discharge: 2017-12-06 | Disposition: A | Discharge status: Home / Self Care | Payer: No Typology Code available for payment source | Source: Ambulatory Visit | Attending: Pulmonary Disease | Admitting: Pulmonary Disease

## 2017-12-06 VITALS — Wt 179.5 lb

## 2017-12-06 DIAGNOSIS — J432 Centrilobular emphysema: Secondary | ICD-10-CM | POA: Diagnosis not present

## 2017-12-06 NOTE — Progress Notes (Signed)
Daily Session Note  Patient Details  Name: [name] MRN: [id number] Date of Birth: Aug 25, 1927 Referring Provider:     Pulmonary Rehab Walk Test from 10/04/2017 in [place]  Referring Provider  Dr. Halford Chessman      Encounter Date: 12/06/2017  Check In: Session Check In - 12/06/17 1330      Check-In   Location  [facility]    Staff Present  Rosebud Poles, RN, BSN;Molly DiVincenzo, MS, ACSM RCEP, Exercise Physiologist;Lisa Ysidro Evert, Felipe Drone, RN, MHA;Carlette Wilber Oliphant, RN, BSN    Supervising physician immediately available to respond to emergencies  [facility] immediately available    Physician(s)  Dr. Broadus John    Medication changes reported      No    Fall or balance concerns reported     No    Tobacco Cessation  No Change    Warm-up and Cool-down  Performed as group-led instruction    Resistance Training Performed  Yes    VAD Patient?  No      Pain Assessment   Currently in Pain?  No/denies    Multiple Pain Sites  No       Capillary Blood Glucose: No results found for this or any previous visit (from the past 24 hour(s)).    Social History   Tobacco Use  Smoking Status Former Smoker  . Packs/day: 1.00  . Years: 55.00  . Pack years: 55.00  . Types: Cigarettes  . Last attempt to quit: 04/22/2011  . Years since quitting: 6.6  Smokeless Tobacco Never Used  Tobacco Comment   occasionally smoked 2-3 times a week--1 cig each time    Goals Met:  Exercise tolerated well Strength training completed today  Goals Unmet:  Not Applicable  Comments: Service time is from 1330 to [place]    Dr. Rush Farmer is Medical Director for Pulmonary Rehab at [facility].

## 2017-12-08 ENCOUNTER — Encounter (HOSPITAL_COMMUNITY)
Admission: RE | Admit: 2017-12-08 | Discharge: 2017-12-08 | Disposition: A | Discharge status: Home / Self Care | Payer: No Typology Code available for payment source | Source: Ambulatory Visit | Attending: Pulmonary Disease | Admitting: Pulmonary Disease

## 2017-12-08 VITALS — Wt 177.9 lb

## 2017-12-08 DIAGNOSIS — J432 Centrilobular emphysema: Secondary | ICD-10-CM

## 2017-12-08 NOTE — Progress Notes (Signed)
Daily Session Note  Patient Details  Name: [name] MRN: [id number] Date of Birth: [birth year] Referring Provider:     Pulmonary Rehab Walk Test from 10/04/2017 in [place]  Referring Provider  Dr. Halford Chessman      Encounter Date: 12/08/2017  Check In: Session Check In - 12/08/17 1330      Check-In   Location  [facility]    Staff Present  Rosebud Poles, RN, BSN;Molly DiVincenzo, MS, ACSM RCEP, Exercise Physiologist;Lisa Ysidro Evert, RN    Supervising physician immediately available to respond to emergencies  [facility] immediately available    Physician(s)  Dr. Herbert Moors    Medication changes reported      No    Fall or balance concerns reported     No    Tobacco Cessation  No Change    Warm-up and Cool-down  Performed as group-led instruction    Resistance Training Performed  Yes    VAD Patient?  No      Pain Assessment   Currently in Pain?  No/denies    Multiple Pain Sites  No       Capillary Blood Glucose: No results found for this or any previous visit (from the past 24 hour(s)).    Social History   Tobacco Use  Smoking Status Former Smoker  . Packs/day: 1.00  . Years: 55.00  . Pack years: 55.00  . Types: Cigarettes  . Last attempt to quit: 04/22/2011  . Years since quitting: 6.6  Smokeless Tobacco Never Used  Tobacco Comment   occasionally smoked 2-3 times a week--1 cig each time    Goals Met:  Exercise tolerated well Strength training completed today  Goals Unmet:  Not Applicable  Comments: Service time is from 1330 to 1530    Dr. Rush Farmer is Medical Director for Pulmonary Rehab at [facility].

## 2017-12-13 ENCOUNTER — Encounter (HOSPITAL_COMMUNITY)
Admission: RE | Admit: 2017-12-13 | Discharge: 2017-12-13 | Disposition: A | Discharge status: Home / Self Care | Payer: No Typology Code available for payment source | Source: Ambulatory Visit | Attending: Pulmonary Disease | Admitting: Pulmonary Disease

## 2017-12-13 VITALS — Wt 177.7 lb

## 2017-12-13 DIAGNOSIS — J432 Centrilobular emphysema: Secondary | ICD-10-CM | POA: Diagnosis not present

## 2017-12-13 NOTE — Progress Notes (Signed)
Daily Session Note  Patient Details  Name: [name] MRN: [id number] Date of Birth: 14-Jan-1928 Referring Provider:     Pulmonary Rehab Walk Test from 10/04/2017 in [place]  Referring Provider  Dr. Halford Chessman      Encounter Date: 12/13/2017  Check In: Session Check In - 12/13/17 1330      Check-In   Location  [facility]    Staff Present  Rosebud Poles, RN, BSN;Molly DiVincenzo, MS, ACSM RCEP, Exercise Physiologist;Lisa Ysidro Evert, Felipe Drone, RN, [facility]    Supervising physician immediately available to respond to emergencies  [facility] immediately available    Physician(s)  Dr. Herbert Moors    Medication changes reported      No    Fall or balance concerns reported     No    Tobacco Cessation  No Change    Warm-up and Cool-down  Performed as group-led instruction    Resistance Training Performed  Yes    VAD Patient?  No    PAD/SET Patient?  No      Pain Assessment   Currently in Pain?  No/denies    Multiple Pain Sites  No       Capillary Blood Glucose: No results found for this or any previous visit (from the past 24 hour(s)).  Exercise Prescription Changes - 12/13/17 1500      Response to Exercise   Blood Pressure (Admit)  132/60    Blood Pressure (Exercise)  160/66    Blood Pressure (Exit)  100/60    Heart Rate (Admit)  76 bpm    Heart Rate (Exercise)  107 bpm    Heart Rate (Exit)  88 bpm    Oxygen Saturation (Admit)  99 %    Oxygen Saturation (Exercise)  93 %    Oxygen Saturation (Exit)  97 %    Rating of Perceived Exertion (Exercise)  12    Perceived Dyspnea (Exercise)  1    Duration  Progress to 45 minutes of aerobic exercise without signs/symptoms of physical distress    Intensity  THRR unchanged      Progression   Progression  Continue to progress workloads to maintain intensity without signs/symptoms of physical distress.      Resistance Training   Training Prescription  Yes    Weight  orange  bands    Reps  10-15    Time  10 Minutes      Interval Training   Interval Training  No      Treadmill   MPH  1    Grade  0    Minutes  17      Bike   Level  3    Minutes  17      NuStep   Level  5    SPM  80    Minutes  17    METs  2.8       Social History   Tobacco Use  Smoking Status Former Smoker  . Packs/day: 1.00  . Years: 55.00  . Pack years: 55.00  . Types: Cigarettes  . Last attempt to quit: 04/22/2011  . Years since quitting: 6.6  Smokeless Tobacco Never Used  Tobacco Comment   occasionally smoked 2-3 times a week--1 cig each time    Goals Met:  Exercise tolerated well Strength training completed today  Goals Unmet:  Not Applicable  Comments: Service time is from 1330 to 1520    Dr. Lloyd Huger.  [name] is Market researcher for Pulmonary Rehab at [facility].

## 2017-12-15 ENCOUNTER — Encounter (HOSPITAL_COMMUNITY)
Admission: RE | Admit: 2017-12-15 | Discharge: 2017-12-15 | Disposition: A | Discharge status: Home / Self Care | Payer: No Typology Code available for payment source | Source: Ambulatory Visit | Attending: Pulmonary Disease | Admitting: Pulmonary Disease

## 2017-12-15 VITALS — Wt 177.9 lb

## 2017-12-15 DIAGNOSIS — J432 Centrilobular emphysema: Secondary | ICD-10-CM | POA: Diagnosis not present

## 2017-12-15 NOTE — Progress Notes (Signed)
Daily Session Note  Patient Details  Name: [name] MRN: [id number] Date of Birth: 08/21/1927 Referring Provider:     Pulmonary Rehab Walk Test from 10/04/2017 in [facility]  Referring Provider  Dr. Sood      Encounter Date: 12/15/2017  Check In: Session Check In - 12/15/17 1230      Check-In   Location  [facility]    Staff Present  Joan Behrens, RN, BSN;Molly DiVincenzo, MS, ACSM RCEP, Exercise Physiologist;Lisa Hughes, RN;Annedrea Stackhouse, RN, MHA    Supervising physician immediately available to respond to emergencies  [facility] immediately available    Physician(s)  Dr. Grunz    Medication changes reported      No    Fall or balance concerns reported     No    Tobacco Cessation  No Change    Warm-up and Cool-down  Performed as group-led instruction    Resistance Training Performed  Yes    VAD Patient?  No    PAD/SET Patient?  No      Pain Assessment   Currently in Pain?  No/denies    Multiple Pain Sites  No       Capillary Blood Glucose: No results found for this or any previous visit (from the past 24 hour(s)).    Social History   Tobacco Use  Smoking Status Former Smoker  . Packs/day: 1.00  . Years: 55.00  . Pack years: 55.00  . Types: Cigarettes  . Last attempt to quit: 04/22/2011  . Years since quitting: 6.6  Smokeless Tobacco Never Used  Tobacco Comment   occasionally smoked 2-3 times a week--1 cig each time    Goals Met:  Exercise tolerated well Strength training completed today  Goals Unmet:  Not Applicable  Comments: Service time is from 1230 to 1445   Dr. Wesam G. Yacoub is Medical Director for Pulmonary Rehab at  Hospital. 

## 2017-12-20 ENCOUNTER — Encounter (HOSPITAL_COMMUNITY)
Admission: RE | Admit: 2017-12-20 | Discharge: 2017-12-20 | Disposition: A | Discharge status: Home / Self Care | Payer: No Typology Code available for payment source | Source: Ambulatory Visit | Attending: Pulmonary Disease | Admitting: Pulmonary Disease

## 2017-12-20 VITALS — Wt 177.7 lb

## 2017-12-20 DIAGNOSIS — E785 Hyperlipidemia, unspecified: Secondary | ICD-10-CM | POA: Diagnosis not present

## 2017-12-20 DIAGNOSIS — Z87891 Personal history of nicotine dependence: Secondary | ICD-10-CM | POA: Diagnosis not present

## 2017-12-20 DIAGNOSIS — J432 Centrilobular emphysema: Secondary | ICD-10-CM | POA: Insufficient documentation

## 2017-12-20 DIAGNOSIS — Z79899 Other long term (current) drug therapy: Secondary | ICD-10-CM | POA: Diagnosis not present

## 2017-12-20 DIAGNOSIS — Z85828 Personal history of other malignant neoplasm of skin: Secondary | ICD-10-CM | POA: Insufficient documentation

## 2017-12-20 NOTE — Progress Notes (Signed)
Daily Session Note  Patient Details  Name: [name] MRN: [id number] Date of Birth: [birth year] Referring Provider:     Pulmonary Rehab Walk Test from 10/04/2017 in [place]  Referring Provider  Dr. Halford Chessman      Encounter Date: 12/20/2017  Check In: Session Check In - 12/20/17 1330      Check-In   Location  [facility]    Staff Present  Rosebud Poles, RN, BSN;Carlette Carlton, RN, BSN;Molly DiVincenzo, MS, ACSM RCEP, Exercise Physiologist;Lisa Ysidro Evert, Felipe Drone, RN, [facility]    Supervising physician immediately available to respond to emergencies  [facility] immediately available    Physician(s)  Dr. Bonner Puna    Medication changes reported      No    Fall or balance concerns reported     No    Tobacco Cessation  No Change    Warm-up and Cool-down  Performed as group-led instruction    Resistance Training Performed  Yes    VAD Patient?  No    PAD/SET Patient?  No      Pain Assessment   Currently in Pain?  No/denies    Multiple Pain Sites  No       Capillary Blood Glucose: No results found for this or any previous visit (from the past 24 hour(s)).    Social History   Tobacco Use  Smoking Status Former Smoker  . Packs/day: 1.00  . Years: 55.00  . Pack years: 55.00  . Types: Cigarettes  . Last attempt to quit: 04/22/2011  . Years since quitting: 6.6  Smokeless Tobacco Never Used  Tobacco Comment   occasionally smoked 2-3 times a week--1 cig each time    Goals Met:  Exercise tolerated well Strength training completed today  Goals Unmet:  Not Applicable  Comments: Service time is from 1330 to [place]   Dr. Rush Farmer is Medical Director for Pulmonary Rehab at [facility].

## 2017-12-20 NOTE — Progress Notes (Signed)
Pulmonary Individual Treatment Plan  Patient Details  Name: [name] MRN: [id number] Date of Birth: 15-Nov-1927 Referring Provider:     Pulmonary Rehab Walk Test from 10/04/2017 in [place]  Referring Provider  Dr. Halford Chessman      Initial Encounter Date:    Pulmonary Rehab Walk Test from 10/04/2017 in [place]  Date  10/04/17      Visit Diagnosis: Centrilobular emphysema ([place])  Patient's Home Medications on Admission:   Current Outpatient Medications:  .  albuterol (PROVENTIL HFA;VENTOLIN HFA) 108 (90 BASE) MCG/ACT inhaler, Inhale 2 puffs into the lungs every 4 (four) hours as needed for wheezing or shortness of breath., Disp: , Rfl:  .  albuterol (PROVENTIL) (2.5 MG/3ML) 0.083% nebulizer solution, USE 1 VIAL IN NEBULIZER EVERY 6 HOURS. Generic: VENTOLIN, Disp: 120 vial, Rfl: 10 .  guaiFENesin (MUCINEX) 600 MG 12 hr tablet, Take 2 tablets (1,200 mg total) by mouth 2 (two) times daily as needed. (Patient not taking: Reported on 10/03/2017), Disp: , Rfl:  .  levothyroxine (SYNTHROID, LEVOTHROID) 50 MCG tablet, Take 50 mcg by mouth daily., Disp: , Rfl:  .  predniSONE (DELTASONE) 5 MG tablet, Take 0.5 tablets (2.5 mg total) by mouth daily with breakfast. (Patient taking differently: Take 2.5 mg by mouth every other day. Taking 1/2 tablet every other day), Disp: 30 tablet, Rfl: 3 .  simvastatin (ZOCOR) 5 MG tablet, Take 5 mg by mouth at bedtime.  , Disp: , Rfl:  .  triamcinolone cream (KENALOG) 0.1 %, Apply topically., Disp: , Rfl:   Past Medical History: Past Medical History:  Diagnosis Date  . Arthritis    osteoarthritis  . Cancer ([place])    skin cancer only.-no melanoma.  Marland Kitchen COPD (chronic obstructive pulmonary disease) ([place])   . History of kidney stones    lithotripsy x1   . History of oxygen administration    Oxygen @ 2 l/m nasally bedtime  . Hyperlipidemia   . Hypothyroidism   . Pulmonary nodule, right   . 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 Tobacco abuse    Quit 09/17/10    Tobacco Use: Social History   Tobacco Use  Smoking Status Former Smoker  . Packs/day: 1.00  . Years: 55.00  . Pack years: 55.00  . Types: Cigarettes  . Last attempt to quit: 04/22/2011  . Years since quitting: 6.6  Smokeless Tobacco Never Used  Tobacco Comment   occasionally smoked 2-3 times a week--1 cig each time    Labs: Recent Review Flowsheet Data    Labs for ITP Cardiac and Pulmonary Rehab Latest Ref Rng & Units 12/07/2006 12/08/2006 12/11/2006 06/03/2014   Cholestrol - - 114 ATP III CLASSIFICATION: <200     mg/dL   Desirable 200-239  mg/dL   Borderline High >=240    mg/dL   High - -   LDLCALC - - 75 Total Cholesterol/HDL:CHD Risk Coronary Heart Disease Risk Table Men   Women 1/2 Average Risk   3.4   3.3 - -   HDL - - 29(L) - -   Trlycerides - - 48 - -   Hemoglobin A1c <5.7 % - - - 6.5(H)   PHART - - 7.433 7.430 -   PCO2ART - - 32.9(L) 38.0 -   HCO3 - 21.1 21.6 24.8(H) -   TCO2 - 22 22.6 25.9 -   ACIDBASEDEF - 2.0 2.0 - -   O2SAT - -  93.8 94.5 -      Capillary Blood Glucose: No results found for: GLUCAP   Pulmonary Assessment Scores: Pulmonary Assessment Scores    Row Name 10/06/17 1310         ADL UCSD   ADL Phase  Entry       mMRC Score   mMRC Score  3        Pulmonary Function Assessment: Pulmonary Function Assessment - 10/03/17 1037      Breath   Bilateral Breath Sounds  Decreased    Shortness of Breath  Limiting activity;Yes       Exercise Target Goals:    Exercise Program Goal: Individual exercise prescription set using results from initial 6 min walk test and THRR while considering  patient's activity barriers and safety.   Exercise Prescription Goal: Initial exercise prescription builds to 30-45 minutes a day of aerobic activity, 2-3 days per week.  Home exercise guidelines will be given to patient during program as part of exercise prescription  that the participant will acknowledge.  Activity Barriers & Risk Stratification: Activity Barriers & Cardiac Risk Stratification - 10/03/17 1027      Activity Barriers & Cardiac Risk Stratification   Activity Barriers  Left Hip Replacement;Deconditioning;Shortness of Breath       6 Minute Walk: 6 Minute Walk    Row Name 10/06/17 1308         6 Minute Walk   Phase  Initial     Distance  800 feet     Walk Time  6 minutes     # of Rest Breaks  - 2 (50 seconds total)     MPH  1.5     METS  2.15     RPE  13     Perceived Dyspnea   3     Symptoms  No     Resting HR  97 bpm     Resting BP  133/77     Resting Oxygen Saturation   97 %     Exercise Oxygen Saturation  during 6 min walk  94 %     Max Ex. HR  125 bpm     Max Ex. BP  164/76       Interval HR   1 Minute HR  107     2 Minute HR  125     3 Minute HR  116     4 Minute HR  117     5 Minute HR  120     6 Minute HR  120     2 Minute Post HR  114     Interval Heart Rate?  Yes       Interval Oxygen   Interval Oxygen?  Yes     Baseline Oxygen Saturation %  97 %     1 Minute Oxygen Saturation %  97 %     1 Minute Liters of Oxygen  0 L     2 Minute Oxygen Saturation %  95 %     2 Minute Liters of Oxygen  0 L     3 Minute Oxygen Saturation %  96 %     3 Minute Liters of Oxygen  0 L     4 Minute Oxygen Saturation %  95 %     4 Minute Liters of Oxygen  0 L     5 Minute Oxygen Saturation %  94 %     5 Minute Liters of   Oxygen  0 L     6 Minute Oxygen Saturation %  94 %     6 Minute Liters of Oxygen  0 L     2 Minute Post Oxygen Saturation %  98 %     2 Minute Post Liters of Oxygen  0 L        Oxygen Initial Assessment: Oxygen Initial Assessment - 10/06/17 1310      Initial 6 min Walk   Oxygen Used  None      Program Oxygen Prescription   Program Oxygen Prescription  None       Oxygen Re-Evaluation: Oxygen Re-Evaluation    Row Name 10/25/17 4431 11/22/17 0657 12/16/17 1344         Program Oxygen  Prescription   Program Oxygen Prescription  None  None  None       Home Oxygen   Home Oxygen Device  Home Concentrator  Home Concentrator  Home Concentrator     Sleep Oxygen Prescription  Continuous  Continuous  Continuous     Liters per minute  _0 Home Exercise Oxygen Prescription  None  None  None     Home at Rest Exercise Oxygen Prescription  None  None  None     Compliance with Home Oxygen Use  Yes  Yes  Yes       Goals/Expected Outcomes   Short Term Goals  To learn and exhibit compliance with exercise, home and travel O2 prescription;To learn and understand importance of monitoring SPO2 with pulse oximeter and demonstrate accurate use of the pulse oximeter.;To learn and understand importance of maintaining oxygen saturations>88%;To learn and demonstrate proper pursed lip breathing techniques or other breathing techniques.;To learn and demonstrate proper use of respiratory medications  To learn and exhibit compliance with exercise, home and travel O2 prescription;To learn and understand importance of monitoring SPO2 with pulse oximeter and demonstrate accurate use of the pulse oximeter.;To learn and understand importance of maintaining oxygen saturations>88%;To learn and demonstrate proper pursed lip breathing techniques or other breathing techniques.;To learn and demonstrate proper use of respiratory medications  To learn and exhibit compliance with exercise, home and travel O2 prescription;To learn and understand importance of monitoring SPO2 with pulse oximeter and demonstrate accurate use of the pulse oximeter.;To learn and understand importance of maintaining oxygen saturations>88%;To learn and demonstrate proper pursed lip breathing techniques or other breathing techniques.;To learn and demonstrate proper use of respiratory medications     Long  Term Goals  Exhibits compliance with exercise, home and travel O2 prescription;Verbalizes importance of monitoring SPO2 with pulse  oximeter and return demonstration;Maintenance of O2 saturations>88%;Exhibits proper breathing techniques, such as pursed lip breathing or other method taught during program session;Compliance with respiratory medication;Demonstrates proper use of MDI's  Exhibits compliance with exercise, home and travel O2 prescription;Verbalizes importance of monitoring SPO2 with pulse oximeter and return demonstration;Maintenance of O2 saturations>88%;Exhibits proper breathing techniques, such as pursed lip breathing or other method taught during program session;Compliance with respiratory medication;Demonstrates proper use of MDI's  Exhibits compliance with exercise, home and travel O2 prescription;Verbalizes importance of monitoring SPO2 with pulse oximeter and return demonstration;Maintenance of O2 saturations>88%;Exhibits proper breathing techniques, such as pursed lip breathing or other method taught during program session;Compliance with respiratory medication;Demonstrates proper use of MDI's     Goals/Expected Outcomes  Patient is compliant  Patient is compliant  Patient is compliant        Oxygen Discharge (Final Oxygen  Re-Evaluation): Oxygen Re-Evaluation - 12/16/17 1344      Program Oxygen Prescription   Program Oxygen Prescription  None      Home Oxygen   Home Oxygen Device  Home Concentrator    Sleep Oxygen Prescription  Continuous    Liters per minute  2    Home Exercise Oxygen Prescription  None    Home at Rest Exercise Oxygen Prescription  None    Compliance with Home Oxygen Use  Yes      Goals/Expected Outcomes   Short Term Goals  To learn and exhibit compliance with exercise, home and travel O2 prescription;To learn and understand importance of monitoring SPO2 with pulse oximeter and demonstrate accurate use of the pulse oximeter.;To learn and understand importance of maintaining oxygen saturations>88%;To learn and demonstrate proper pursed lip breathing techniques or other breathing  techniques.;To learn and demonstrate proper use of respiratory medications    Long  Term Goals  Exhibits compliance with exercise, home and travel O2 prescription;Verbalizes importance of monitoring SPO2 with pulse oximeter and return demonstration;Maintenance of O2 saturations>88%;Exhibits proper breathing techniques, such as pursed lip breathing or other method taught during program session;Compliance with respiratory medication;Demonstrates proper use of MDI's    Goals/Expected Outcomes  Patient is compliant       Initial Exercise Prescription: Initial Exercise Prescription - 10/06/17 1300      Date of Initial Exercise RX and Referring Provider   Date  10/04/17    Referring Provider  Dr. Halford Chessman      Treadmill   MPH  1    Grade  0    Minutes  17      Bike   Level  0.4    Minutes  17      NuStep   Level  2    SPM  80    Minutes  17    METs  1.5      Prescription Details   Frequency (times per week)  2    Duration  Progress to 45 minutes of aerobic exercise without signs/symptoms of physical distress      Intensity   THRR 40-80% of Max Heartrate  53-106    Ratings of Perceived Exertion  11-13    Perceived Dyspnea  0-4      Progression   Progression  Continue progressive overload as per policy without signs/symptoms or physical distress.      Resistance Training   Training Prescription  Yes    Weight  orange    Reps  10-15       Perform Capillary Blood Glucose checks as needed.  Exercise Prescription Changes: Exercise Prescription Changes    Row Name 10/18/17 1538 11/01/17 1600 11/10/17 0818 11/15/17 1600 11/29/17 1500     Response to Exercise   Blood Pressure (Admit)  130/50  118/50  -  148/70  144/58   Blood Pressure (Exercise)  104/50  120/60  -  176/62  130/70   Blood Pressure (Exit)  136/60  130/60  -  136/64  134/70   Heart Rate (Admit)  75 bpm  89 bpm  -  81 bpm  77 bpm   Heart Rate (Exercise)  102 bpm  108 bpm  -  112 bpm  93 bpm   Heart Rate (Exit)   86 bpm  87 bpm  -  94 bpm  82 bpm   Oxygen Saturation (Admit)  99 %  99 %  -  97 %  99 %  Oxygen Saturation (Exercise)  98 %  95 %  -  91 %  100 %   Oxygen Saturation (Exit)  100 %  97 %  -  93 %  98 %   Rating of Perceived Exertion (Exercise)  13  13  -  12  12   Perceived Dyspnea (Exercise)  2  3  -  2  2   Duration  Progress to 45 minutes of aerobic exercise without signs/symptoms of physical distress  Progress to 45 minutes of aerobic exercise without signs/symptoms of physical distress  -  Progress to 45 minutes of aerobic exercise without signs/symptoms of physical distress  Progress to 45 minutes of aerobic exercise without signs/symptoms of physical distress   Intensity  THRR unchanged 40-80% HRR  THRR unchanged  -  THRR unchanged  THRR unchanged     Progression   Progression  -  -  -  Continue to progress workloads to maintain intensity without signs/symptoms of physical distress.  Continue to progress workloads to maintain intensity without signs/symptoms of physical distress.     Resistance Training   Training Prescription  Yes  Yes  -  Yes  Yes   Weight  orange  orange bands  -  orange bands  orange bands   Reps  10-15  10-15  -  10-15  10-15   Time  -  10 Minutes  -  10 Minutes  10 Minutes     Interval Training   Interval Training  -  -  -  -  No     Treadmill   MPH  1  1  -  1  1   Grade  0  0  -  0  0   Minutes  17  17  -  17  17     Bike   Level  1 recumbent bike  2  -  2  3   Minutes  17  17  -  17  17     NuStep   Level  2  3  -  3  4   SPM  80  -  -  80  80   Minutes  17  17  -  17  17   METs  2.2  2.3  -  2.4  2.6     Home Exercise Plan   Plans to continue exercise at  -  -  [employer] (comment) [employer]  -  -   Frequency  -  -  Add 2 additional days to program exercise sessions.  -  -   Row Name 12/13/17 1500             Response to Exercise   Blood Pressure (Admit)  132/60       Blood Pressure (Exercise)  160/66       Blood Pressure  (Exit)  100/60       Heart Rate (Admit)  76 bpm       Heart Rate (Exercise)  107 bpm       Heart Rate (Exit)  88 bpm       Oxygen Saturation (Admit)  99 %       Oxygen Saturation (Exercise)  93 %       Oxygen Saturation (Exit)  97 %       Rating of Perceived Exertion (Exercise)  12       Perceived Dyspnea (Exercise)  1  Duration  Progress to 45 minutes of aerobic exercise without signs/symptoms of physical distress       Intensity  THRR unchanged         Progression   Progression  Continue to progress workloads to maintain intensity without signs/symptoms of physical distress.         Resistance Training   Training Prescription  Yes       Weight  orange bands       Reps  10-15       Time  10 Minutes         Interval Training   Interval Training  No         Treadmill   MPH  1       Grade  0       Minutes  17         Bike   Level  3       Minutes  17         NuStep   Level  5       SPM  80       Minutes  17       METs  2.8          Exercise Comments: Exercise Comments    Row Name 11/15/17 0817           Exercise Comments  Home exercise has been completed          Exercise Goals and Review: Exercise Goals    [place] Name 10/03/17 1028             Exercise Goals   Increase Physical Activity  Yes       Intervention  Provide advice, education, support and counseling about physical activity/exercise needs.;Develop an individualized exercise prescription for aerobic and resistive training based on initial evaluation findings, risk stratification, comorbidities and participant's personal goals.       Expected Outcomes  Short Term: Attend rehab on a regular basis to increase amount of physical activity.;Long Term: Exercising regularly at least 3-5 days a week.;Long Term: Add in home exercise to make exercise part of routine and to increase amount of physical activity.       Increase Strength and Stamina  Yes       Intervention  Provide advice, education,  support and counseling about physical activity/exercise needs.;Develop an individualized exercise prescription for aerobic and resistive training based on initial evaluation findings, risk stratification, comorbidities and participant's personal goals.       Expected Outcomes  Short Term: Increase workloads from initial exercise prescription for resistance, speed, and METs.;Short Term: Perform resistance training exercises routinely during rehab and add in resistance training at home;Long Term: Improve cardiorespiratory fitness, muscular endurance and strength as measured by increased METs and functional capacity (6MWT)       Able to understand and use rate of perceived exertion (RPE) scale  Yes       Intervention  Provide education and explanation on how to use RPE scale       Expected Outcomes  Short Term: Able to use RPE daily in rehab to express subjective intensity level;Long Term:  Able to use RPE to guide intensity level when exercising independently       Able to understand and use Dyspnea scale  Yes       Intervention  Provide education and explanation on how to use Dyspnea scale       Expected Outcomes  Short Term: Able  to use Dyspnea scale daily in rehab to express subjective sense of shortness of breath during exertion;Long Term: Able to use Dyspnea scale to guide intensity level when exercising independently       Knowledge and understanding of Target Heart Rate Range (THRR)  Yes       Intervention  Provide education and explanation of THRR including how the numbers were predicted and where they are located for reference       Expected Outcomes  Short Term: Able to state/look up THRR;Short Term: Able to use daily as guideline for intensity in rehab;Long Term: Able to use THRR to govern intensity when exercising independently       Understanding of Exercise Prescription  Yes       Intervention  Provide education, explanation, and written materials on patient's individual exercise prescription        Expected Outcomes  Short Term: Able to explain program exercise prescription;Long Term: Able to explain home exercise prescription to exercise independently          Exercise Goals Re-Evaluation : Exercise Goals Re-Evaluation    Row Name 10/25/17 0728 11/22/17 0657 12/16/17 1344         Exercise Goal Re-Evaluation   Exercise Goals Review  Increase Physical Activity;Able to understand and use rate of perceived exertion (RPE) scale;Knowledge and understanding of Target Heart Rate Range (THRR);Understanding of Exercise Prescription;Increase Strength and Stamina;Able to understand and use Dyspnea scale  Increase Physical Activity;Able to understand and use rate of perceived exertion (RPE) scale;Knowledge and understanding of Target Heart Rate Range (THRR);Understanding of Exercise Prescription;Increase Strength and Stamina;Able to understand and use Dyspnea scale  Increase Physical Activity;Able to understand and use rate of perceived exertion (RPE) scale;Knowledge and understanding of Target Heart Rate Range (THRR);Understanding of Exercise Prescription;Increase Strength and Stamina;Able to understand and use Dyspnea scale     Comments  Patient has only attended 4 rehab sessions but is performing well. Limited by shortness of breath. Will cont to monitor and motivate as tolerated.   Patient's progress is slow due to his severe deconditioning. We have discussed home exercise but patient is reluctant due to his shortness of breath with even simple activities. Progress in program is going to be slow but patient's attendance is consistent and he is motivated to make changes. Will cont to monitor and progress when he is able.  Patient's progress is slow due to his severe deconditioning. We have discussed home exercise but patient is reluctant due to his shortness of breath with even simple activities. Progress in program is going to be slow but patient's attendance is consistent and he is motivated to  make changes. Will cont to monitor and progress when he is able.     Expected Outcomes  Through exercise at rehab and at home, patient will increase physical capacity and be able to carry out ADL's with ease at home. Patient will also gain the confidence and knowledge to adhere to an exercise regime at home.  Through exercise at rehab and at home, the patient will decrease shortness of breath with daily activities and feel confident in carrying out an exercise regime at home.   Through exercise at rehab and at home, the patient will decrease shortness of breath with daily activities and feel confident in carrying out an exercise regime at home.         Discharge Exercise Prescription (Final Exercise Prescription Changes): Exercise Prescription Changes - 12/13/17 1500      Response to Exercise     Blood Pressure (Admit)  132/60    Blood Pressure (Exercise)  160/66    Blood Pressure (Exit)  100/60    Heart Rate (Admit)  76 bpm    Heart Rate (Exercise)  107 bpm    Heart Rate (Exit)  88 bpm    Oxygen Saturation (Admit)  99 %    Oxygen Saturation (Exercise)  93 %    Oxygen Saturation (Exit)  97 %    Rating of Perceived Exertion (Exercise)  12    Perceived Dyspnea (Exercise)  1    Duration  Progress to 45 minutes of aerobic exercise without signs/symptoms of physical distress    Intensity  THRR unchanged      Progression   Progression  Continue to progress workloads to maintain intensity without signs/symptoms of physical distress.      Resistance Training   Training Prescription  Yes    Weight  orange bands    Reps  10-15    Time  10 Minutes      Interval Training   Interval Training  No      Treadmill   MPH  1    Grade  0    Minutes  17      Bike   Level  3    Minutes  17      NuStep   Level  5    SPM  80    Minutes  17    METs  2.8       Nutrition:  Target Goals: Understanding of nutrition guidelines, daily intake of sodium <1518m, cholesterol <2054m calories 30%  from fat and 7% or less from saturated fats, daily to have 5 or more servings of fruits and vegetables.  Biometrics: Pre Biometrics - 10/03/17 1039      Pre Biometrics   Grip Strength  38 kg        Nutrition Therapy Plan and Nutrition Goals:   Nutrition Assessments:   Nutrition Goals Re-Evaluation:   Nutrition Goals Discharge (Final Nutrition Goals Re-Evaluation):   Psychosocial: Target Goals: Acknowledge presence or absence of significant depression and/or stress, maximize coping skills, provide positive support system. Participant is able to verbalize types and ability to use techniques and skills needed for reducing stress and depression.  Initial Review & Psychosocial Screening: Initial Psych Review & Screening - 10/03/17 1037      Initial Review   Current issues with  None Identified      Family Dynamics   Good Support System?  Yes      Barriers   Psychosocial barriers to participate in program  There are no identifiable barriers or psychosocial needs.      Screening Interventions   Interventions  Encouraged to exercise       Quality of Life Scores:  Scores of 19 and below usually indicate a poorer quality of life in these areas.  A difference of  2-3 points is a clinically meaningful difference.  A difference of 2-3 points in the total score of the Quality of Life Index has been associated with significant improvement in overall quality of life, self-image, physical symptoms, and general health in studies assessing change in quality of life.   PHQ-9: Recent Review Flowsheet Data    [facility] 01/05/2017   Decreased Interest 0 0   Down, Depressed, Hopeless 0 0   PHQ - 2 Score 0 0     Interpretation of Total Score  Total Score Depression Severity:  1-4 = Minimal depression, 5-9 = Mild depression, 10-14 = Moderate depression, 15-19 = Moderately severe depression, 20-27 = Severe depression   Psychosocial Evaluation and  Intervention: Psychosocial Evaluation - 11/23/17 1317      Psychosocial Evaluation & Interventions   Interventions  Encouraged to exercise with the program and follow exercise prescription    Comments  Pt demonstrates an upbeat and pleasant attitude.  Pt interacts well with staff and fellow participants.    Expected Outcomes  patient will remain free from psychosocial barriers to participation in pulmonary rehab.    Continue Psychosocial Services   Follow up required by staff       Psychosocial Re-Evaluation: Psychosocial Re-Evaluation    [place] Name 10/24/17 1741 12/20/17 1141           Psychosocial Re-Evaluation   Current issues with  None Identified  None Identified      Comments  -  no barriers      Expected Outcomes  patient will remain free from psychosocial barriers to participation in pulmonary rehab  patient will remain free from psychosocial barriers to participation in pulmonary rehab      Continue Psychosocial Services   Follow up required by staff  No Follow up required         Psychosocial Discharge (Final Psychosocial Re-Evaluation): Psychosocial Re-Evaluation - 12/20/17 1141      Psychosocial Re-Evaluation   Current issues with  None Identified    Comments  no barriers    Expected Outcomes  patient will remain free from psychosocial barriers to participation in pulmonary rehab    Continue Psychosocial Services   No Follow up required        Education: Education Goals: Education classes will be provided on a weekly basis, covering required topics. Participant will state understanding/return demonstration of topics presented.  Learning Barriers/Preferences: Learning Barriers/Preferences - 10/03/17 1036      Learning Barriers/Preferences   Learning Barriers  None    Learning Preferences  Written Material       Education Topics: How Lungs Work and Diseases: - Discuss the anatomy of the lungs and diseases that can affect the lungs, such as  COPD.   Exercise: -Discuss the importance of exercise, FITT principles of exercise, normal and abnormal responses to exercise, and how to exercise safely.   Environmental Irritants: -Discuss types of environmental irritants and how to limit exposure to environmental irritants.   Meds/Inhalers and oxygen: - Discuss respiratory medications, definition of an inhaler and oxygen, and the proper way to use an inhaler and oxygen.   Energy Saving Techniques: - Discuss methods to conserve energy and decrease shortness of breath when performing activities of daily living.    Bronchial Hygiene / Breathing Techniques: - Discuss breathing mechanics, pursed-lip breathing technique,  proper posture, effective ways to clear airways, and other functional breathing techniques   Cleaning Equipment: - Provides group verbal and written instruction about the health risks of elevated stress, cause of high stress, and healthy ways to reduce stress.   Nutrition I: Fats: - Discuss the types of cholesterol, what cholesterol does to the body, and how cholesterol levels can be controlled.   Nutrition II: Labels: -Discuss the different components of food labels and how to read food labels.   Respiratory Infections: - Discuss the signs and symptoms of respiratory infections, ways to prevent respiratory infections, and the importance of seeking medical treatment when having a respiratory infection.   Stress I: Signs and Symptoms: - Discuss the causes  of stress, how stress may lead to anxiety and depression, and ways to limit stress.   Stress II: Relaxation: -Discuss relaxation techniques to limit stress.   Oxygen for Home/Travel: - Discuss how to prepare for travel when on oxygen and proper ways to transport and store oxygen to ensure safety.   Knowledge Questionnaire Score: Knowledge Questionnaire Score - 10/03/17 1036      Knowledge Questionnaire Score   Pre Score  15/18       Core  Components/Risk Factors/Patient Goals at Admission: Personal Goals and Risk Factors at Admission - 10/03/17 1037      Core Components/Risk Factors/Patient Goals on Admission   Improve shortness of breath with ADL's  Yes    Intervention  Provide education, individualized exercise plan and daily activity instruction to help decrease symptoms of SOB with activities of daily living.    Expected Outcomes  Short Term: Improve cardiorespiratory fitness to achieve a reduction of symptoms when performing ADLs;Long Term: Be able to perform more ADLs without symptoms or delay the onset of symptoms       Core Components/Risk Factors/Patient Goals Review:  Goals and Risk Factor Review    Row Name 10/24/17 1738 11/23/17 1316 12/20/17 1139         Core Components/Risk Factors/Patient Goals Review   Personal Goals Review  Improve shortness of breath with ADL's;Develop more efficient breathing techniques such as purse lipped breathing and diaphragmatic breathing and practicing self-pacing with activity.  Improve shortness of breath with ADL's;Develop more efficient breathing techniques such as purse lipped breathing and diaphragmatic breathing and practicing self-pacing with activity.  Improve shortness of breath with ADL's;Develop more efficient breathing techniques such as purse lipped breathing and diaphragmatic breathing and practicing self-pacing with activity.     Review  patient is doing well in pulmonary rehab. he states he is enjoying the program and feels he will benifit from the education and the exercise. he is learning PLB and pacing however he needs continuous cueing as this is not yet a learned technique. Expect to see progression towards pulmonary rehab goals over the next 30 days. Patient has only attended 4 sessions since admission and too early to evaluate progression towards goals.  patient is doing well in pulmonary rehab. he states he is enjoying the program and feels he will benifit from the  education and the exercise. he is learning PLB and pacing however he needs some verbal cueing as this is not yet a learned technique. Expect to see progression towards pulmonary rehab goals over the next 30 days.   Continues to do well, level 5 on nustep, level 3 on recumbent bike, treadmil 1.0 mph/0% grade, walking fast is difficult for him     Expected Outcomes  see admission expected outcomes.  see admission expected outcomes.  see admission expected outcomes.        Core Components/Risk Factors/Patient Goals at Discharge (Final Review):  Goals and Risk Factor Review - 12/20/17 1139      Core Components/Risk Factors/Patient Goals Review   Personal Goals Review  Improve shortness of breath with ADL's;Develop more efficient breathing techniques such as purse lipped breathing and diaphragmatic breathing and practicing self-pacing with activity.    Review  Continues to do well, level 5 on nustep, level 3 on recumbent bike, treadmil 1.0 mph/0% grade, walking fast is difficult for him    Expected Outcomes  see admission expected outcomes.       ITP Comments: ITP Comments    Row Name   10/24/17 1737 11/23/17 1316         ITP Comments  Dr. Jennet Maduro, Medical Director  Dr. Jennet Maduro, Medical Director         Comments: ITP REVIEW Pt is making expected progress toward pulmonary rehab goals after completing 18 sessions. Recommend continued exercise, life style modification, education, and utilization of breathing techniques to increase stamina and strength and decrease shortness of breath with exertion.

## 2017-12-27 ENCOUNTER — Encounter (HOSPITAL_COMMUNITY): Payer: No Typology Code available for payment source

## 2017-12-29 ENCOUNTER — Encounter (HOSPITAL_COMMUNITY): Payer: No Typology Code available for payment source

## 2018-01-03 ENCOUNTER — Encounter (HOSPITAL_COMMUNITY): Payer: No Typology Code available for payment source

## 2018-01-03 ENCOUNTER — Telehealth (HOSPITAL_COMMUNITY): Payer: Self-pay | Admitting: *Deleted

## 2018-01-05 ENCOUNTER — Encounter (HOSPITAL_COMMUNITY): Payer: No Typology Code available for payment source

## 2018-01-10 ENCOUNTER — Encounter (HOSPITAL_COMMUNITY)
Admission: RE | Admit: 2018-01-10 | Discharge: 2018-01-10 | Disposition: A | Discharge status: Home / Self Care | Payer: No Typology Code available for payment source | Source: Ambulatory Visit | Attending: Pulmonary Disease | Admitting: Pulmonary Disease

## 2018-01-10 ENCOUNTER — Encounter: Payer: Self-pay | Admitting: Radiation Oncology

## 2018-01-10 ENCOUNTER — Telehealth: Payer: Self-pay | Admitting: Radiation Oncology

## 2018-01-10 NOTE — Progress Notes (Signed)
Pulmonary Individual Treatment Plan  Patient Details  Name: DEJUAN ELMAN MRN: [id number] Date of Birth: 09-06-1927 Referring Provider:     Pulmonary Rehab Walk Test from 10/04/2017 in [facility]  Referring Provider  Dr. Halford Chessman      Initial Encounter Date:    Pulmonary Rehab Walk Test from 10/04/2017 in [facility]  Date  10/04/17      Visit Diagnosis: Centrilobular emphysema ([place])  Patient's Home Medications on Admission:   Current Outpatient Medications:  .  albuterol (PROVENTIL HFA;VENTOLIN HFA) 108 (90 BASE) MCG/ACT inhaler, Inhale 2 puffs into the lungs every 4 (four) hours as needed for wheezing or shortness of breath., Disp: , Rfl:  .  albuterol (PROVENTIL) (2.5 MG/3ML) 0.083% nebulizer solution, USE 1 VIAL IN NEBULIZER EVERY 6 HOURS. Generic: VENTOLIN, Disp: 120 vial, Rfl: 10 .  guaiFENesin (MUCINEX) 600 MG 12 hr tablet, Take 2 tablets (1,200 mg total) by mouth 2 (two) times daily as needed. (Patient not taking: Reported on 10/03/2017), Disp: , Rfl:  .  levothyroxine (SYNTHROID, LEVOTHROID) 50 MCG tablet, Take 50 mcg by mouth daily., Disp: , Rfl:  .  predniSONE (DELTASONE) 5 MG tablet, Take 0.5 tablets (2.5 mg total) by mouth daily with breakfast. (Patient taking differently: Take 2.5 mg by mouth every other day. Taking 1/2 tablet every other day), Disp: 30 tablet, Rfl: 3 .  simvastatin (ZOCOR) 5 MG tablet, Take 5 mg by mouth at bedtime.  , Disp: , Rfl:  .  triamcinolone cream (KENALOG) 0.1 %, Apply topically., Disp: , Rfl:   Past Medical History: Past Medical History:  Diagnosis Date  . Arthritis    osteoarthritis  . Cancer ([place])    skin cancer only.-no melanoma.  Marland Kitchen COPD (chronic obstructive pulmonary disease) ([place])   . History of kidney stones    lithotripsy x1   . History of oxygen administration    Oxygen @ 2 l/m nasally bedtime  . Hyperlipidemia   . Hypothyroidism   . Pulmonary nodule, right   . 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 Tobacco abuse    Quit 09/17/10    Tobacco Use: Social History   Tobacco Use  Smoking Status Former Smoker  . Packs/day: 1.00  . Years: 55.00  . Pack years: 55.00  . Types: Cigarettes  . Last attempt to quit: 04/22/2011  . Years since quitting: 6.7  Smokeless Tobacco Never Used  Tobacco Comment   occasionally smoked 2-3 times a week--1 cig each time    Labs: Recent Review Flowsheet Data    Labs for ITP Cardiac and Pulmonary Rehab Latest Ref Rng & Units 12/07/2006 12/08/2006 12/11/2006 06/03/2014   Cholestrol - - 114 ATP III CLASSIFICATION: <200     mg/dL   Desirable 200-239  mg/dL   Borderline High >=240    mg/dL   High - -   LDLCALC - - 75 Total Cholesterol/HDL:CHD Risk Coronary Heart Disease Risk Table Men   Women 1/2 Average Risk   3.4   3.3 - -   HDL - - 29(L) - -   Trlycerides - - 48 - -   Hemoglobin A1c <5.7 % - - - 6.5(H)   PHART - - 7.433 7.430 -   PCO2ART - - 32.9(L) 38.0 -   HCO3 - 21.1 21.6 24.8(H) -   TCO2 - 22 22.6 25.9 -   ACIDBASEDEF - 2.0 2.0 - -   O2SAT - -  93.8 94.5 -      Capillary Blood Glucose: No results found for: GLUCAP   Pulmonary Assessment Scores: Pulmonary Assessment Scores    Row Name 10/06/17 1310         ADL UCSD   ADL Phase  Entry       mMRC Score   mMRC Score  3        Pulmonary Function Assessment: Pulmonary Function Assessment - 10/03/17 1037      Breath   Bilateral Breath Sounds  Decreased    Shortness of Breath  Limiting activity;Yes       Exercise Target Goals:    Exercise Program Goal: Individual exercise prescription set using results from initial 6 min walk test and THRR while considering  patient's activity barriers and safety.    Exercise Prescription Goal: Initial exercise prescription builds to 30-45 minutes a day of aerobic activity, 2-3 days per week.  Home exercise guidelines will be given to patient during program as part of exercise prescription  that the participant will acknowledge.  Activity Barriers & Risk Stratification: Activity Barriers & Cardiac Risk Stratification - 10/03/17 1027      Activity Barriers & Cardiac Risk Stratification   Activity Barriers  Left Hip Replacement;Deconditioning;Shortness of Breath       6 Minute Walk: 6 Minute Walk    Row Name 10/06/17 1308         6 Minute Walk   Phase  Initial     Distance  800 feet     Walk Time  6 minutes     # of Rest Breaks  - 2 (50 seconds total)     MPH  1.5     METS  2.15     RPE  13     Perceived Dyspnea   3     Symptoms  No     Resting HR  97 bpm     Resting BP  133/77     Resting Oxygen Saturation   97 %     Exercise Oxygen Saturation  during 6 min walk  94 %     Max Ex. HR  125 bpm     Max Ex. BP  164/76       Interval HR   1 Minute HR  107     2 Minute HR  125     3 Minute HR  116     4 Minute HR  117     5 Minute HR  120     6 Minute HR  120     2 Minute Post HR  114     Interval Heart Rate?  Yes       Interval Oxygen   Interval Oxygen?  Yes     Baseline Oxygen Saturation %  97 %     1 Minute Oxygen Saturation %  97 %     1 Minute Liters of Oxygen  0 L     2 Minute Oxygen Saturation %  95 %     2 Minute Liters of Oxygen  0 L     3 Minute Oxygen Saturation %  96 %     3 Minute Liters of Oxygen  0 L     4 Minute Oxygen Saturation %  95 %     4 Minute Liters of Oxygen  0 L     5 Minute Oxygen Saturation %  94 %     5 Minute Liters  of Oxygen  0 L     6 Minute Oxygen Saturation %  94 %     6 Minute Liters of Oxygen  0 L     2 Minute Post Oxygen Saturation %  98 %     2 Minute Post Liters of Oxygen  0 L        Oxygen Initial Assessment: Oxygen Initial Assessment - 10/06/17 1310      Initial 6 min Walk   Oxygen Used  None      Program Oxygen Prescription   Program Oxygen Prescription  None       Oxygen Re-Evaluation: Oxygen Re-Evaluation    Row Name 10/25/17 0728 11/22/17 0657 12/16/17 1344 01/09/18 1238       Program  Oxygen Prescription   Program Oxygen Prescription  None  None  None  None      Home Oxygen   Home Oxygen Device  Home Concentrator  Home Concentrator  Home Concentrator  Home Concentrator    Sleep Oxygen Prescription  Continuous  Continuous  Continuous  Continuous    Liters per minute  _0 Home Exercise Oxygen Prescription  None  None  None  None    Home at Rest Exercise Oxygen Prescription  None  None  None  None    Compliance with Home Oxygen Use  Yes  Yes  Yes  Yes      Goals/Expected Outcomes   Short Term Goals  To learn and exhibit compliance with exercise, home and travel O2 prescription;To learn and understand importance of monitoring SPO2 with pulse oximeter and demonstrate accurate use of the pulse oximeter.;To learn and understand importance of maintaining oxygen saturations>88%;To learn and demonstrate proper pursed lip breathing techniques or other breathing techniques.;To learn and demonstrate proper use of respiratory medications  To learn and exhibit compliance with exercise, home and travel O2 prescription;To learn and understand importance of monitoring SPO2 with pulse oximeter and demonstrate accurate use of the pulse oximeter.;To learn and understand importance of maintaining oxygen saturations>88%;To learn and demonstrate proper pursed lip breathing techniques or other breathing techniques.;To learn and demonstrate proper use of respiratory medications  To learn and exhibit compliance with exercise, home and travel O2 prescription;To learn and understand importance of monitoring SPO2 with pulse oximeter and demonstrate accurate use of the pulse oximeter.;To learn and understand importance of maintaining oxygen saturations>88%;To learn and demonstrate proper pursed lip breathing techniques or other breathing techniques.;To learn and demonstrate proper use of respiratory medications  To learn and exhibit compliance with exercise, home and travel O2 prescription;To learn and  understand importance of monitoring SPO2 with pulse oximeter and demonstrate accurate use of the pulse oximeter.;To learn and understand importance of maintaining oxygen saturations>88%;To learn and demonstrate proper pursed lip breathing techniques or other breathing techniques.;To learn and demonstrate proper use of respiratory medications    Long  Term Goals  Exhibits compliance with exercise, home and travel O2 prescription;Verbalizes importance of monitoring SPO2 with pulse oximeter and return demonstration;Maintenance of O2 saturations>88%;Exhibits proper breathing techniques, such as pursed lip breathing or other method taught during program session;Compliance with respiratory medication;Demonstrates proper use of MDI's  Exhibits compliance with exercise, home and travel O2 prescription;Verbalizes importance of monitoring SPO2 with pulse oximeter and return demonstration;Maintenance of O2 saturations>88%;Exhibits proper breathing techniques, such as pursed lip breathing or other method taught during program session;Compliance with respiratory medication;Demonstrates proper use of MDI's  Exhibits compliance with exercise, home and  travel O2 prescription;Verbalizes importance of monitoring SPO2 with pulse oximeter and return demonstration;Maintenance of O2 saturations>88%;Exhibits proper breathing techniques, such as pursed lip breathing or other method taught during program session;Compliance with respiratory medication;Demonstrates proper use of MDI's  Exhibits compliance with exercise, home and travel O2 prescription;Verbalizes importance of monitoring SPO2 with pulse oximeter and return demonstration;Maintenance of O2 saturations>88%;Exhibits proper breathing techniques, such as pursed lip breathing or other method taught during program session;Compliance with respiratory medication;Demonstrates proper use of MDI's    Goals/Expected Outcomes  Patient is compliant  Patient is compliant  Patient is  compliant  Patient is compliant       Oxygen Discharge (Final Oxygen Re-Evaluation): Oxygen Re-Evaluation - 01/09/18 1238      Program Oxygen Prescription   Program Oxygen Prescription  None      Home Oxygen   Home Oxygen Device  Home Concentrator    Sleep Oxygen Prescription  Continuous    Liters per minute  2    Home Exercise Oxygen Prescription  None    Home at Rest Exercise Oxygen Prescription  None    Compliance with Home Oxygen Use  Yes      Goals/Expected Outcomes   Short Term Goals  To learn and exhibit compliance with exercise, home and travel O2 prescription;To learn and understand importance of monitoring SPO2 with pulse oximeter and demonstrate accurate use of the pulse oximeter.;To learn and understand importance of maintaining oxygen saturations>88%;To learn and demonstrate proper pursed lip breathing techniques or other breathing techniques.;To learn and demonstrate proper use of respiratory medications    Long  Term Goals  Exhibits compliance with exercise, home and travel O2 prescription;Verbalizes importance of monitoring SPO2 with pulse oximeter and return demonstration;Maintenance of O2 saturations>88%;Exhibits proper breathing techniques, such as pursed lip breathing or other method taught during program session;Compliance with respiratory medication;Demonstrates proper use of MDI's    Goals/Expected Outcomes  Patient is compliant       Initial Exercise Prescription: Initial Exercise Prescription - 10/06/17 1300      Date of Initial Exercise RX and Referring Provider   Date  10/04/17    Referring Provider  Dr. Halford Chessman      Treadmill   MPH  1    Grade  0    Minutes  17      Bike   Level  0.4    Minutes  17      NuStep   Level  2    SPM  80    Minutes  17    METs  1.5      Prescription Details   Frequency (times per week)  2    Duration  Progress to 45 minutes of aerobic exercise without signs/symptoms of physical distress      Intensity   THRR  40-80% of Max Heartrate  53-106    Ratings of Perceived Exertion  11-13    Perceived Dyspnea  0-4      Progression   Progression  Continue progressive overload as per policy without signs/symptoms or physical distress.      Resistance Training   Training Prescription  Yes    Weight  orange    Reps  10-15       Perform Capillary Blood Glucose checks as needed.  Exercise Prescription Changes:  Exercise Prescription Changes    Row Name 10/18/17 1538 11/01/17 1600 11/10/17 0818 11/15/17 1600 11/29/17 1500     Response to Exercise   Blood Pressure (Admit)  130/50  118/50  -  148/70  144/58   Blood Pressure (Exercise)  104/50  120/60  -  176/62  130/70   Blood Pressure (Exit)  136/60  130/60  -  136/64  134/70   Heart Rate (Admit)  75 bpm  89 bpm  -  81 bpm  77 bpm   Heart Rate (Exercise)  102 bpm  108 bpm  -  112 bpm  93 bpm   Heart Rate (Exit)  86 bpm  87 bpm  -  94 bpm  82 bpm   Oxygen Saturation (Admit)  99 %  99 %  -  97 %  99 %   Oxygen Saturation (Exercise)  98 %  95 %  -  91 %  100 %   Oxygen Saturation (Exit)  100 %  97 %  -  93 %  98 %   Rating of Perceived Exertion (Exercise)  13  13  -  12  12   Perceived Dyspnea (Exercise)  2  3  -  2  2   Duration  Progress to 45 minutes of aerobic exercise without signs/symptoms of physical distress  Progress to 45 minutes of aerobic exercise without signs/symptoms of physical distress  -  Progress to 45 minutes of aerobic exercise without signs/symptoms of physical distress  Progress to 45 minutes of aerobic exercise without signs/symptoms of physical distress   Intensity  THRR unchanged 40-80% HRR  THRR unchanged  -  THRR unchanged  THRR unchanged     Progression   Progression  -  -  -  Continue to progress workloads to maintain intensity without signs/symptoms of physical distress.  Continue to progress workloads to maintain intensity without signs/symptoms of physical distress.     Resistance Training   Training Prescription  Yes   Yes  -  Yes  Yes   Weight  orange  orange bands  -  orange bands  orange bands   Reps  10-15  10-15  -  10-15  10-15   Time  -  10 Minutes  -  10 Minutes  10 Minutes     Interval Training   Interval Training  -  -  -  -  No     Treadmill   MPH  1  1  -  1  1   Grade  0  0  -  0  0   Minutes  17  17  -  17  17     Bike   Level  1 recumbent bike  2  -  2  3   Minutes  17  17  -  17  17     NuStep   Level  2  3  -  3  4   SPM  80  -  -  80  80   Minutes  17  17  -  17  17   METs  2.2  2.3  -  2.4  2.6     Home Exercise Plan   Plans to continue exercise at  -  -  [employer] (comment) [employer]  -  -   Frequency  -  -  Add 2 additional days to program exercise sessions.  -  -   Row Name 12/13/17 1500 12/27/17 1400           Response to Exercise   Blood Pressure (Admit)  132/60  148/70  Blood Pressure (Exercise)  160/66  160/60      Blood Pressure (Exit)  100/60  124/60      Heart Rate (Admit)  76 bpm  71 bpm      Heart Rate (Exercise)  107 bpm  117 bpm      Heart Rate (Exit)  88 bpm  88 bpm      Oxygen Saturation (Admit)  99 %  98 %      Oxygen Saturation (Exercise)  93 %  92 %      Oxygen Saturation (Exit)  97 %  97 %      Rating of Perceived Exertion (Exercise)  12  12      Perceived Dyspnea (Exercise)  1  2      Duration  Progress to 45 minutes of aerobic exercise without signs/symptoms of physical distress  Progress to 45 minutes of aerobic exercise without signs/symptoms of physical distress      Intensity  THRR unchanged  THRR unchanged        Progression   Progression  Continue to progress workloads to maintain intensity without signs/symptoms of physical distress.  Continue to progress workloads to maintain intensity without signs/symptoms of physical distress.        Resistance Training   Training Prescription  Yes  Yes      Weight  orange bands  orange bands      Reps  10-15  10-15      Time  10 Minutes  10 Minutes        Interval Training    Interval Training  No  No        Treadmill   MPH  1  1      Grade  0  0      Minutes  17  17        Bike   Level  3  3      Minutes  17  17        NuStep   Level  5  5      SPM  80  80      Minutes  17  17      METs  2.8  2.8         Exercise Comments:  Exercise Comments    Row Name 11/15/17 0817           Exercise Comments  Home exercise has been completed          Exercise Goals and Review:  Exercise Goals    Row Name 10/03/17 1028             Exercise Goals   Increase Physical Activity  Yes       Intervention  Provide advice, education, support and counseling about physical activity/exercise needs.;Develop an individualized exercise prescription for aerobic and resistive training based on initial evaluation findings, risk stratification, comorbidities and participant's personal goals.       Expected Outcomes  Short Term: Attend rehab on a regular basis to increase amount of physical activity.;Long Term: Exercising regularly at least 3-5 days a week.;Long Term: Add in home exercise to make exercise part of routine and to increase amount of physical activity.       Increase Strength and Stamina  Yes       Intervention  Provide advice, education, support and counseling about physical activity/exercise needs.;Develop an individualized exercise prescription for aerobic and resistive training based on initial evaluation findings, risk  stratification, comorbidities and participant's personal goals.       Expected Outcomes  Short Term: Increase workloads from initial exercise prescription for resistance, speed, and METs.;Short Term: Perform resistance training exercises routinely during rehab and add in resistance training at home;Long Term: Improve cardiorespiratory fitness, muscular endurance and strength as measured by increased METs and functional capacity (6MWT)       Able to understand and use rate of perceived exertion (RPE) scale  Yes       Intervention  Provide  education and explanation on how to use RPE scale       Expected Outcomes  Short Term: Able to use RPE daily in rehab to express subjective intensity level;Long Term:  Able to use RPE to guide intensity level when exercising independently       Able to understand and use Dyspnea scale  Yes       Intervention  Provide education and explanation on how to use Dyspnea scale       Expected Outcomes  Short Term: Able to use Dyspnea scale daily in rehab to express subjective sense of shortness of breath during exertion;Long Term: Able to use Dyspnea scale to guide intensity level when exercising independently       Knowledge and understanding of Target Heart Rate Range (THRR)  Yes       Intervention  Provide education and explanation of THRR including how the numbers were predicted and where they are located for reference       Expected Outcomes  Short Term: Able to state/look up THRR;Short Term: Able to use daily as guideline for intensity in rehab;Long Term: Able to use THRR to govern intensity when exercising independently       Understanding of Exercise Prescription  Yes       Intervention  Provide education, explanation, and written materials on patient's individual exercise prescription       Expected Outcomes  Short Term: Able to explain program exercise prescription;Long Term: Able to explain home exercise prescription to exercise independently          Exercise Goals Re-Evaluation : Exercise Goals Re-Evaluation    Row Name 10/25/17 0728 11/22/17 0657 12/16/17 1344 01/09/18 1238       Exercise Goal Re-Evaluation   Exercise Goals Review  Increase Physical Activity;Able to understand and use rate of perceived exertion (RPE) scale;Knowledge and understanding of Target Heart Rate Range (THRR);Understanding of Exercise Prescription;Increase Strength and Stamina;Able to understand and use Dyspnea scale  Increase Physical Activity;Able to understand and use rate of perceived exertion (RPE)  scale;Knowledge and understanding of Target Heart Rate Range (THRR);Understanding of Exercise Prescription;Increase Strength and Stamina;Able to understand and use Dyspnea scale  Increase Physical Activity;Able to understand and use rate of perceived exertion (RPE) scale;Knowledge and understanding of Target Heart Rate Range (THRR);Understanding of Exercise Prescription;Increase Strength and Stamina;Able to understand and use Dyspnea scale  Increase Physical Activity;Able to understand and use rate of perceived exertion (RPE) scale;Knowledge and understanding of Target Heart Rate Range (THRR);Understanding of Exercise Prescription;Increase Strength and Stamina;Able to understand and use Dyspnea scale    Comments  Patient has only attended 4 rehab sessions but is performing well. Limited by shortness of breath. Will cont to monitor and motivate as tolerated.   Patient's progress is slow due to his severe deconditioning. We have discussed home exercise but patient is reluctant due to his shortness of breath with even simple activities. Progress in program is going to be slow but patient's attendance is  consistent and he is motivated to make changes. Will cont to monitor and progress when he is able.  Patient's progress is slow due to his severe deconditioning. We have discussed home exercise but patient is reluctant due to his shortness of breath with even simple activities. Progress in program is going to be slow but patient's attendance is consistent and he is motivated to make changes. Will cont to monitor and progress when he is able.  Patient's progress is slow due to his severe deconditioning. We have discussed home exercise but patient is reluctant due to his shortness of breath with even simple activities. Progress in program is going to be slow but patient's attendance is consistent and he is motivated to make changes. Patient has been out since 12/20/17 due to skin cancer removal. Will cont to monitor and  progress when he is able.    Expected Outcomes  Through exercise at rehab and at home, patient will increase physical capacity and be able to carry out ADL's with ease at home. Patient will also gain the confidence and knowledge to adhere to an exercise regime at home.  Through exercise at rehab and at home, the patient will decrease shortness of breath with daily activities and feel confident in carrying out an exercise regime at home.   Through exercise at rehab and at home, the patient will decrease shortness of breath with daily activities and feel confident in carrying out an exercise regime at home.   Through exercise at rehab and at home, the patient will decrease shortness of breath with daily activities and feel confident in carrying out an exercise regime at home.        Discharge Exercise Prescription (Final Exercise Prescription Changes): Exercise Prescription Changes - 12/27/17 1400      Response to Exercise   Blood Pressure (Admit)  148/70    Blood Pressure (Exercise)  160/60    Blood Pressure (Exit)  124/60    Heart Rate (Admit)  71 bpm    Heart Rate (Exercise)  117 bpm    Heart Rate (Exit)  88 bpm    Oxygen Saturation (Admit)  98 %    Oxygen Saturation (Exercise)  92 %    Oxygen Saturation (Exit)  97 %    Rating of Perceived Exertion (Exercise)  12    Perceived Dyspnea (Exercise)  2    Duration  Progress to 45 minutes of aerobic exercise without signs/symptoms of physical distress    Intensity  THRR unchanged      Progression   Progression  Continue to progress workloads to maintain intensity without signs/symptoms of physical distress.      Resistance Training   Training Prescription  Yes    Weight  orange bands    Reps  10-15    Time  10 Minutes      Interval Training   Interval Training  No      Treadmill   MPH  1    Grade  0    Minutes  17      Bike   Level  3    Minutes  17      NuStep   Level  5    SPM  80    Minutes  17    METs  2.8        Nutrition:  Target Goals: Understanding of nutrition guidelines, daily intake of sodium <1556m, cholesterol <2070m calories 30% from fat and 7% or less from saturated fats, daily  to have 5 or more servings of fruits and vegetables.  Biometrics: Pre Biometrics - 10/03/17 1039      Pre Biometrics   Grip Strength  38 kg        Nutrition Therapy Plan and Nutrition Goals:   Nutrition Assessments:   Nutrition Goals Re-Evaluation:   Nutrition Goals Discharge (Final Nutrition Goals Re-Evaluation):   Psychosocial: Target Goals: Acknowledge presence or absence of significant depression and/or stress, maximize coping skills, provide positive support system. Participant is able to verbalize types and ability to use techniques and skills needed for reducing stress and depression.  Initial Review & Psychosocial Screening: Initial Psych Review & Screening - 10/03/17 1037      Initial Review   Current issues with  None Identified      Family Dynamics   Good Support System?  Yes      Barriers   Psychosocial barriers to participate in program  There are no identifiable barriers or psychosocial needs.      Screening Interventions   Interventions  Encouraged to exercise       Quality of Life Scores:  Scores of 19 and below usually indicate a poorer quality of life in these areas.  A difference of  2-3 points is a clinically meaningful difference.  A difference of 2-3 points in the total score of the Quality of Life Index has been associated with significant improvement in overall quality of life, self-image, physical symptoms, and general health in studies assessing change in quality of life.   PHQ-9: Recent Review Flowsheet Data    Depression screen [facility] 2/9 10/03/2017 01/05/2017   Decreased Interest 0 0   Down, Depressed, Hopeless 0 0   PHQ - 2 Score 0 0     Interpretation of Total Score  Total Score Depression Severity:  1-4 = Minimal depression, 5-9 = Mild depression,  10-14 = Moderate depression, 15-19 = Moderately severe depression, 20-27 = Severe depression   Psychosocial Evaluation and Intervention: Psychosocial Evaluation - 01/10/18 1253      Psychosocial Evaluation & Interventions   Interventions  Encouraged to exercise with the program and follow exercise prescription    Comments  Pt demonstrates an upbeat and pleasant attitude.  Pt interacts well with staff and fellow participants.    Expected Outcomes  patient will remain free from psychosocial barriers to participation in pulmonary rehab.    Continue Psychosocial Services   Follow up required by staff       Psychosocial Re-Evaluation: Psychosocial Re-Evaluation    [place] Name 10/24/17 1741 12/20/17 1141 01/10/18 1253         Psychosocial Re-Evaluation   Current issues with  None Identified  None Identified  None Identified     Comments  -  no barriers  no barriers     Expected Outcomes  patient will remain free from psychosocial barriers to participation in pulmonary rehab  patient will remain free from psychosocial barriers to participation in pulmonary rehab  patient will remain free from psychosocial barriers to participation in pulmonary rehab     Continue Psychosocial Services   Follow up required by staff  No Follow up required  Follow up required by staff        Psychosocial Discharge (Final Psychosocial Re-Evaluation): Psychosocial Re-Evaluation - 01/10/18 1253      Psychosocial Re-Evaluation   Current issues with  None Identified    Comments  no barriers    Expected Outcomes  patient will remain free from psychosocial barriers  to participation in pulmonary rehab    Continue Psychosocial Services   Follow up required by staff       Education: Education Goals: Education classes will be provided on a weekly basis, covering required topics. Participant will state understanding/return demonstration of topics presented.  Learning Barriers/Preferences: Learning  Barriers/Preferences - 10/03/17 1036      Learning Barriers/Preferences   Learning Barriers  None    Learning Preferences  Written Material       Education Topics: Risk Factor Reduction:  -Group instruction that is supported by a PowerPoint presentation. Instructor discusses the definition of a risk factor, different risk factors for pulmonary disease, and how the heart and lungs work together.     Nutrition for Pulmonary Patient:  -Group instruction provided by PowerPoint slides, verbal discussion, and written materials to support subject matter. The instructor gives an explanation and review of healthy diet recommendations, which includes a discussion on weight management, recommendations for fruit and vegetable consumption, as well as protein, fluid, caffeine, fiber, sodium, sugar, and alcohol. Tips for eating when patients are short of breath are discussed.   Pursed Lip Breathing:  -Group instruction that is supported by demonstration and informational handouts. Instructor discusses the benefits of pursed lip and diaphragmatic breathing and detailed demonstration on how to preform both.     Oxygen Safety:  -Group instruction provided by PowerPoint, verbal discussion, and written material to support subject matter. There is an overview of "What is Oxygen" and "Why do we need it".  Instructor also reviews how to create a safe environment for oxygen use, the importance of using oxygen as prescribed, and the risks of noncompliance. There is a brief discussion on traveling with oxygen and resources the patient may utilize.   Oxygen Equipment:  -Group instruction provided by [facility] Staff utilizing handouts, written materials, and equipment demonstrations.   PULMONARY REHAB OTHER RESPIRATORY from 12/15/2017 in [place]  Date  10/20/17  Educator  george with [employer]  Instruction Review Code  1- Verbalizes Understanding      Signs and Symptoms:   -Group instruction provided by written material and verbal discussion to support subject matter. Warning signs and symptoms of infection, stroke, and heart attack are reviewed and when to call the physician/911 reinforced. Tips for preventing the spread of infection discussed.   Advanced Directives:  -Group instruction provided by verbal instruction and written material to support subject matter. Instructor reviews Advanced Directive laws and proper instruction for filling out document.   Pulmonary Video:  -Group video education that reviews the importance of medication and oxygen compliance, exercise, good nutrition, pulmonary hygiene, and pursed lip and diaphragmatic breathing for the pulmonary patient.   Exercise for the Pulmonary Patient:  -Group instruction that is supported by a PowerPoint presentation. Instructor discusses benefits of exercise, core components of exercise, frequency, duration, and intensity of an exercise routine, importance of utilizing pulse oximetry during exercise, safety while exercising, and options of places to exercise outside of rehab.     PULMONARY REHAB OTHER RESPIRATORY from 12/15/2017 in [place]  Date  10/27/17  Educator  Cloyde Reams  Instruction Review Code  1- Verbalizes Understanding      Pulmonary Medications:  -Verbally interactive group education provided by instructor with focus on inhaled medications and proper administration.   PULMONARY REHAB OTHER RESPIRATORY from 12/15/2017 in [place]  Date  12/08/17  Educator  pharmacy  Instruction Review Code  1- Actuary and Physiology of the Respiratory System and Intimacy:  -Group instruction provided by PowerPoint, verbal discussion, and written material to support subject matter. Instructor reviews respiratory cycle and anatomical components of the respiratory system and their functions. Instructor also  reviews differences in obstructive and restrictive respiratory diseases with examples of each. Intimacy, Sex, and Sexuality differences are reviewed with a discussion on how relationships can change when diagnosed with pulmonary disease. Common sexual concerns are reviewed.   MD DAY -A group question and answer session with a medical doctor that allows participants to ask questions that relate to their pulmonary disease state.   PULMONARY REHAB OTHER RESPIRATORY from 12/15/2017 in [place]  Date  11/24/17  Educator  Dr.Yacoub  Instruction Review Code  1- Verbalizes Understanding      OTHER EDUCATION -Group or individual verbal, written, or video instructions that support the educational goals of the pulmonary rehab program.   PULMONARY REHAB OTHER RESPIRATORY from 12/15/2017 in [place]  Date  12/15/17  Educator  [place]  Instruction Review Code  1- Verbalizes Understanding      Holiday Eating Survival Tips:  -Group instruction provided by PowerPoint slides, verbal discussion, and written materials to support subject matter. The instructor gives patients tips, tricks, and techniques to help them not only survive but enjoy the holidays despite the onslaught of food that accompanies the holidays.   Knowledge Questionnaire Score: Knowledge Questionnaire Score - 10/03/17 1036      Knowledge Questionnaire Score   Pre Score  15/18       Core Components/Risk Factors/Patient Goals at Admission: Personal Goals and Risk Factors at Admission - 10/03/17 1037      Core Components/Risk Factors/Patient Goals on Admission   Improve shortness of breath with ADL's  Yes    Intervention  Provide education, individualized exercise plan and daily activity instruction to help decrease symptoms of SOB with activities of daily living.    Expected Outcomes  Short Term: Improve cardiorespiratory  fitness to achieve a reduction of symptoms when performing ADLs;Long Term: Be able to perform more ADLs without symptoms or delay the onset of symptoms       Core Components/Risk Factors/Patient Goals Review:  Goals and Risk Factor Review    Row Name 10/24/17 1738 11/23/17 1316 12/20/17 1139 01/10/18 1250       Core Components/Risk Factors/Patient Goals Review   Personal Goals Review  Improve shortness of breath with ADL's;Develop more efficient breathing techniques such as purse lipped breathing and diaphragmatic breathing and practicing self-pacing with activity.  Improve shortness of breath with ADL's;Develop more efficient breathing techniques such as purse lipped breathing and diaphragmatic breathing and practicing self-pacing with activity.  Improve shortness of breath with ADL's;Develop more efficient breathing techniques such as purse lipped breathing and diaphragmatic breathing and practicing self-pacing with activity.  Improve shortness of breath with ADL's;Develop more efficient breathing techniques such as purse lipped breathing and diaphragmatic breathing and practicing self-pacing with activity.    Review  patient is doing well in pulmonary rehab. he states he is enjoying the program and feels he will benifit from the education and the exercise. he is learning PLB and pacing however he needs continuous cueing as this is not yet a learned technique. Expect to see progression towards pulmonary rehab goals over the next 30 days. Patient has only attended 4 sessions since admission and  too early to evaluate progression towards goals.  patient is doing well in pulmonary rehab. he states he is enjoying the program and feels he will benifit from the education and the exercise. he is learning PLB and pacing however he needs some verbal cueing as this is not yet a learned technique. Expect to see progression towards pulmonary rehab goals over the next 30 days.   Continues to do well, level 5 on  nustep, level 3 on recumbent bike, treadmil 1.0 mph/0% grade, walking fast is difficult for him  Pt has completed 19 exercise sessions.  Pt last session was 7/2.  [place] skin cancer lesion.  Pt is hopeful to return this week.  Pt observed using PLB particular while ambulating on the track.  continue to review.    Expected Outcomes  see admission expected outcomes.  see admission expected outcomes.  see admission expected outcomes.  see admission expected outcomes.       Core Components/Risk Factors/Patient Goals at Discharge (Final Review):  Goals and Risk Factor Review - 01/10/18 1250      Core Components/Risk Factors/Patient Goals Review   Personal Goals Review  Improve shortness of breath with ADL's;Develop more efficient breathing techniques such as purse lipped breathing and diaphragmatic breathing and practicing self-pacing with activity.    Review  Pt has completed 19 exercise sessions.  Pt last session was 7/2.  [place] skin cancer lesion.  Pt is hopeful to return this week.  Pt observed using PLB particular while ambulating on the track.  continue to review.    Expected Outcomes  see admission expected outcomes.       ITP Comments: ITP Comments    Row Name 10/24/17 1737 11/23/17 1316 01/10/18 1250       ITP Comments  Dr. Jennet Maduro, Medical Director  Dr. Jennet Maduro, Medical Director  Dr. Jennet Maduro, Medical Director        Comments:  Pt has completed 19 exercise sessions. Cherre Huger, BSN Cardiac and Training and development officer

## 2018-01-10 NOTE — Telephone Encounter (Signed)
I spoke with patient he stated he only use [employer] insurance for his Pulmonary Rehab visit and medication. Patient states he is not using [employer] insurance for Radiation Oncology visit. I had spoken with Cloyde Reams at Dr. Harvel Quale office she also stated that patient is using Medicare and [place] insurance. Patient is scheduled to see Dr. Lisbeth Renshaw and Shona Simpson on 01/24/2018 @ 10:00 am

## 2018-01-12 ENCOUNTER — Encounter (HOSPITAL_COMMUNITY): Payer: No Typology Code available for payment source

## 2018-01-17 ENCOUNTER — Encounter (HOSPITAL_COMMUNITY): Payer: No Typology Code available for payment source

## 2018-01-18 NOTE — Progress Notes (Signed)
Histology and Location of Primary Skin Cancer:Skin neoplasm Rule out Squamous cell carcinoma left scalp and  right ear   [name] presented with the following signs/symptoms, months ago:Worrisome Lesion    Scaling lesions on the scalp lesion on the left scalp,Erythematous, scaly  gritty papules located on the scalp and left cheek , Erythematous scaling papule located on the left scalp, 0.8 cm in size. Erythematous, shiny, pearly papule on the right ear, 0.4 cm in size.  History of non-melanoma skin cancer He goes to dermatologist every 6 months  Past/Anticipated interventions by patient's surgeon/dermatologist for current problematic lesion, if any: Dr. Izora Ribas             01-02-18         Biopsy Site 11-30-17  Dr. Dianna Limbo Final Diagnosis 1. 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 Skin, shave biopsy, right ear: Actinic keratosis    We will plan for a round of topical chemotherapy cream this winter  Cryotherapy today to his most hyperkeratotic lesions   Rule out Squamous cell carcinoma Site 1: left scalp Rule out Basal cell carcinoma and Squamous cell carcinoma Site 2: right ear Biopsy x 2   Past skin cancers, if any: 1) Location/Histology/Intervention:10-13-16 11-11-16 Left ear scabbed and incision,   Mohs Surgery 08/26/16 Left ear       04/19/16 suture removal with Dr. Harvel Quale   2) Location/Histology/Intervention:   3) Location/Histology/Intervention:   History of Blistering sunburns, if ZGY:FVCBSW worked outdoors, numerous sunburns, no sunscreen, worse in [place] in [place], January 1951,swimming,   Pain No  SAFETY ISSUES: Prior radiation? :10/13/16-11/11/16 Left pinna/ 55 Gy   Pacemaker/ICD? :No  Possible current pregnancy? : N/A  Is the patient on methotrexate? :No  Current Complaints / other details: Father unknown cancer Wt Readings from Last 3 Encounters:  01/24/18 177 lb 3.2  oz (80.4 kg)  12/20/17 177 lb 11.1 oz (80.6 kg)  12/15/17 177 lb 14.6 oz (80.7 kg)   BP (!) 149/67 (BP Location: Left Arm, Patient Position: Sitting, Cuff Size: Normal)   Pulse 75   Temp 97.6 F (36.4 C) (Oral)   Resp (!) 24   Ht 5\' 10"  (1.778 m)   Wt 177 lb 3.2 oz (80.4 kg)   SpO2 100%   BMI 25.43 kg/m

## 2018-01-19 ENCOUNTER — Encounter (HOSPITAL_COMMUNITY): Payer: No Typology Code available for payment source

## 2018-01-24 ENCOUNTER — Encounter: Payer: Self-pay | Admitting: Radiation Oncology

## 2018-01-24 ENCOUNTER — Ambulatory Visit
Admission: RE | Admit: 2018-01-24 | Discharge: 2018-01-24 | Disposition: A | Discharge status: Home / Self Care | Payer: Medicare Other | Source: Ambulatory Visit | Attending: Radiation Oncology | Admitting: Radiation Oncology

## 2018-01-24 ENCOUNTER — Other Ambulatory Visit: Payer: Self-pay

## 2018-01-24 ENCOUNTER — Encounter (HOSPITAL_COMMUNITY): Payer: No Typology Code available for payment source

## 2018-01-24 VITALS — BP 149/67 | HR 75 | Temp 97.6°F | Resp 24 | Ht 70.0 in | Wt 177.2 lb

## 2018-01-24 DIAGNOSIS — C44329 Squamous cell carcinoma of skin of other parts of face: Secondary | ICD-10-CM

## 2018-01-24 DIAGNOSIS — C44229 Squamous cell carcinoma of skin of left ear and external auricular canal: Secondary | ICD-10-CM

## 2018-01-24 DIAGNOSIS — M1990 Unspecified osteoarthritis, unspecified site: Secondary | ICD-10-CM | POA: Diagnosis not present

## 2018-01-24 DIAGNOSIS — Z87891 Personal history of nicotine dependence: Secondary | ICD-10-CM | POA: Insufficient documentation

## 2018-01-24 DIAGNOSIS — J449 Chronic obstructive pulmonary disease, unspecified: Secondary | ICD-10-CM | POA: Insufficient documentation

## 2018-01-24 DIAGNOSIS — Z923 Personal history of irradiation: Secondary | ICD-10-CM | POA: Diagnosis not present

## 2018-01-24 DIAGNOSIS — E039 Hypothyroidism, unspecified: Secondary | ICD-10-CM | POA: Insufficient documentation

## 2018-01-24 DIAGNOSIS — Z96642 Presence of left artificial hip joint: Secondary | ICD-10-CM | POA: Insufficient documentation

## 2018-01-24 DIAGNOSIS — E785 Hyperlipidemia, unspecified: Secondary | ICD-10-CM | POA: Diagnosis not present

## 2018-01-24 NOTE — Addendum Note (Signed)
Encounter addended by: Malena Edman, RN on: 01/24/2018 2:41 PM  Actions taken: Charge Capture section accepted

## 2018-01-24 NOTE — Progress Notes (Addendum)
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 gentleman who has been followed by Dr. Shon Millet, of Dermatology at [facility], for the past 5 years for a history of non-melanoma skin cancer. He has had numerous skin excisions for suspicious lesions, particularly on his face. He had previous Moh's procedure with Dr. Harvel Quale in October 2017. In follow up with Dr. Shon Millet on 08/26/16, he mentioned concerns with soreness in his left ear, and was found to have scaling papules on his forehead, nose, left cheek, left leg, and left ear. Shave biopsies of the forehead, nose, and left ear were obtained at that time. Biopsy of the forehead revealed basal cell carcinoma (nodular type), biopsy of the nose revealed actinic keratosis, and biopsy of the left ear revealed invasive squamous cell carcinoma (extending to the deep margin). He presented to Dr. Harvel Quale of the [facility] on 09/06/16 who performed shave biopsy of the left malar area revealing inflamed seborrheic keratosis, and mohs surgery of the left crus of anthelix revealing extensive perineural invasive by squamous cell carcinoma. He went on to receive adjuvant radiotherapy to the left pinna, and tolerated radiotherapy very well. He followed up with Dr. Harvel Quale recently and an area along his left forehead was of concern. The lesion was excised and the specimen was 4.3 x 1.8 x 0.4 cm and had infiltrating squamous cell carcinoma with perineural invasion extending to the lateral margin, and the maximum diameter of the involved nerve was .2 mm. He  proceeded with Mohs procedure, and given the risk of local recurrence, he comes today to discuss options of adjuvant radiotherapy to the left forehead. Of note he was seen yesterday by Dr. Harvel Quale and is ready per report to proceed with radiotherapy.  On review of systems, the patient reports that he is doing well overall. He denies any chest pain, shortness of breath, cough, fevers, chills, night sweats, unintended weight changes. He is using vaseline to the surgical site and reports it is healing well. He denies any bowel or bladder disturbances, and denies abdominal pain, nausea or vomiting. He denies any new musculoskeletal or joint aches or pains, new skin lesions or concerns. A complete review of systems is obtained and is otherwise negative.   Past Medical History:  Past Medical History:  Diagnosis Date  . Arthritis    osteoarthritis  . Cancer ([place])    skin cancer only.-no melanoma.  Marland Kitchen COPD (chronic obstructive pulmonary disease) ([place])   . History of kidney stones    lithotripsy x1   . History of oxygen administration    Oxygen @ 2 l/m nasally bedtime  . Hyperlipidemia   . Hypothyroidism   . Pulmonary nodule, right   . 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 Tobacco abuse    Quit 09/17/10    Past Surgical History: Past Surgical History:  Procedure Laterality Date  . CATARACT EXTRACTION, BILATERAL    . CONVERSION TO TOTAL HIP Left 04/28/2015   Procedure: LEFT HIP CONVERSION PREVIOUS HIP SURGERY TO TOTAL HIP ARTHROPLASTY (ANTERIOR);  Surgeon: Paralee Cancel, MD;  Location: [facility];  Service: Orthopedics;  Laterality: Left;  . electronavigational bronchoscopy  07/2010  . HIP PINNING,CANNULATED Left 06/04/2014   Procedure: LEFT HIP REDUCTION WITH PERCUTANEOUS SCREWS;  Surgeon: Mauri Pole, MD;  Location: [facility];  Service: Orthopedics;  Laterality: Left;  . TRACHEOSTOMY     after aspiration event as a 84 y/o    Social History:  Social History   Socioeconomic History    . Marital status: Married    Spouse name: Not on file  . Number of children: Not on file  . Years of education: Not on file  . Highest education level: Not on file  Occupational History  . Not on file  Social Needs  . Financial resource strain: Not on file  . Food insecurity:    Worry: Not on file    Inability: Not on file  . Transportation needs:    Medical: Not on file    Non-medical: Not on file  Tobacco Use  . Smoking status: Former Smoker    Packs/day: 1.00    Years: 55.00    Pack years: 55.00    Types: Cigarettes    Last attempt to quit: 04/22/2011    Years since quitting: 6.7  . Smokeless tobacco: Never Used  . Tobacco comment: occasionally smoked 2-3 times a week--1 cig each time  Substance and Sexual Activity  . Alcohol use: Yes    Comment: rare  social  . Drug use: No  . Sexual activity: Not on file  Lifestyle  . Physical activity:    Days per week: Not on file    Minutes per session: Not on file  . Stress: Not on file  Relationships  . Social connections:    Talks on phone: Not on file    Gets together: Not on file    Attends religious service: Not on file    Active member of club or organization: Not on file    Attends meetings of clubs or organizations: Not on file    Relationship status: Not on file  . Intimate partner violence:    Fear of current or ex partner: Not on file    Emotionally abused: Not on file    Physically abused: Not on file    Forced sexual activity: Not on file  Other Topics Concern  . Not on file  Social History Narrative  . Not on file  The patient is married and lives in [place].   Family History: Family History  Problem Relation Age of Onset  . Cancer Neg Hx      ALLERGIES:  is allergic to sulfonamide derivatives.  Meds: Current Outpatient Medications  Medication Sig Dispense Refill  . albuterol (PROVENTIL HFA;VENTOLIN HFA) 108 (90 BASE) MCG/ACT inhaler Inhale 2 puffs into the lungs every 4 (four) hours as  needed for wheezing or shortness of breath.    Marland Kitchen albuterol (PROVENTIL) (2.5 MG/3ML) 0.083% nebulizer solution USE 1 VIAL IN NEBULIZER EVERY 6 HOURS. Generic: VENTOLIN 120 vial 10  . levothyroxine (SYNTHROID, LEVOTHROID) 50 MCG tablet Take 50 mcg by mouth daily.    . predniSONE (DELTASONE) 5 MG tablet Take 0.5 tablets (2.5 mg total) by mouth daily with breakfast. (Patient taking differently: Take 2.5 mg by mouth every other day. Taking 1/2 tablet every other day) 30 tablet 3  . simvastatin (ZOCOR) 5 MG tablet Take 5 mg by mouth at bedtime.      Marland Kitchen guaiFENesin (MUCINEX) 600 MG 12 hr  tablet Take 2 tablets (1,200 mg total) by mouth 2 (two) times daily as needed. (Patient not taking: Reported on 10/03/2017)    . triamcinolone cream (KENALOG) 0.1 % Apply topically.     No current facility-administered medications for this encounter.     Physical Findings:  height is 5\' 10"  (1.778 m) and weight is 177 lb 3.2 oz (80.4 kg). His oral temperature is 97.6 F (36.4 C). His blood pressure is 149/67 (abnormal) and his pulse is 75. His respiration is 24 (abnormal) and oxygen saturation is 100%.  Pain Assessment Pain Score: 0-No pain/10 In general this is a well appearing Caucasian male in no acute distress. He's alert and oriented x4 and appropriate throughout the examination. Cardiopulmonary assessment is negative for acute distress and he exhibits normal effort. The patient's left pinna is examined. No evidence of desquamation or skin breakdown is identified. There is a small skin disruption circumferentially that is of about 2 mm at the base of the pinna. This is stable since his last evaluation. He has about a 6 x 4 cm surgical site with granulation tissue and eschar over the left aspect of his forehead and scalp seen below.     Lab Findings: Lab Results  Component Value Date   WBC 8.3 07/11/2015   HGB 12.6 (L) 07/11/2015   HCT 38.5 (L) 07/11/2015   MCV 90.4 07/11/2015   PLT 482.0 (H) 07/11/2015      Radiographic Findings: No results found.  Impression/Plan: 1. 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 Dr. Lisbeth Renshaw discusses the pathology findings and reviews the nature of localized squamous cell carcinomas of the skin and the role of radiotherapy due to the margins and perineural invasion seen on his original biopsy. We discussed the risks, benefits, short, and long term effects of radiotherapy, and the patient is interested in proceeding. Dr. Lisbeth Renshaw discusses the delivery and logistics of radiotherapy and anticipates a course of 4 weeks of radiotherapy. Written consent is obtained and placed in the chart, a copy was provided to the patient. Our staff will coordinate a clinical set up for his treatment planning. 2. Squamous cell carcinoma of the left pinna with perineural invasion. The patient's had great local control with his prior radiation to the pinna. We will follow this expectantly.   In a visit lasting 30 minutes, greater than 50% of the time was spent face to face discussing his case, and coordinating the patient's care.  The above documentation reflects my direct findings during this shared patient visit. Please see the separate note by Dr. Lisbeth Renshaw on this date for the remainder of the patient's plan of care.     Carola Rhine, PAC

## 2018-01-26 ENCOUNTER — Encounter (HOSPITAL_COMMUNITY): Payer: No Typology Code available for payment source

## 2018-01-27 ENCOUNTER — Encounter (HOSPITAL_COMMUNITY): Payer: Self-pay | Admitting: *Deleted

## 2018-01-27 ENCOUNTER — Ambulatory Visit
Admission: RE | Admit: 2018-01-27 | Discharge: 2018-01-27 | Disposition: A | Discharge status: Home / Self Care | Payer: Medicare Other | Source: Ambulatory Visit | Attending: Radiation Oncology | Admitting: Radiation Oncology

## 2018-01-27 DIAGNOSIS — C44329 Squamous cell carcinoma of skin of other parts of face: Secondary | ICD-10-CM | POA: Diagnosis not present

## 2018-01-27 DIAGNOSIS — Z510 Encounter for antineoplastic radiation therapy: Secondary | ICD-10-CM | POA: Insufficient documentation

## 2018-01-27 DIAGNOSIS — J432 Centrilobular emphysema: Secondary | ICD-10-CM

## 2018-01-27 NOTE — Addendum Note (Signed)
Encounter addended by: Ivonne Andrew, RD on: 01/27/2018 12:06 PM  Actions taken: Visit Navigator Flowsheet section accepted

## 2018-01-27 NOTE — Progress Notes (Signed)
Discharge Progress Report  Patient Details  Name: [name] MRN: [id number] Date of Birth: 09/08/1927 Referring Provider:     Pulmonary Rehab Walk Test from 10/04/2017 in [place]  Referring Provider  Dr. Halford Chessman       Number of Visits: 19  Reason for Discharge:  Early Exit:  health related.  Pt had skin cancer lesion removed and had an extended recovery and mutually decided to discharge from pulmonary rehab.  Smoking History:  Social History   Tobacco Use  Smoking Status Former Smoker  . Packs/day: 1.00  . Years: 55.00  . Pack years: 55.00  . Types: Cigarettes  . Last attempt to quit: 04/22/2011  . Years since quitting: 6.7  Smokeless Tobacco Never Used  Tobacco Comment   occasionally smoked 2-3 times a week--1 cig each time    Diagnosis:  Centrilobular emphysema (HCC)  ADL UCSD:   Initial Exercise Prescription:   Discharge Exercise Prescription (Final Exercise Prescription Changes): Exercise Prescription Changes - 12/27/17 1400      Response to Exercise   Blood Pressure (Admit)  148/70    Blood Pressure (Exercise)  160/60    Blood Pressure (Exit)  124/60    Heart Rate (Admit)  71 bpm    Heart Rate (Exercise)  117 bpm    Heart Rate (Exit)  88 bpm    Oxygen Saturation (Admit)  98 %    Oxygen Saturation (Exercise)  92 %    Oxygen Saturation (Exit)  97 %    Rating of Perceived Exertion (Exercise)  12    Perceived Dyspnea (Exercise)  2    Duration  Progress to 45 minutes of aerobic exercise without signs/symptoms of physical distress    Intensity  THRR unchanged      Progression   Progression  Continue to progress workloads to maintain intensity without signs/symptoms of physical distress.      Resistance Training   Training Prescription  Yes    Weight  orange bands    Reps  10-15    Time  10 Minutes      Interval Training   Interval Training  No      Treadmill   MPH  1    Grade  0    Minutes  17      Bike   Level  3    Minutes  17      NuStep   Level  5    SPM  80    Minutes  17    METs  2.8       Functional Capacity:   Psychological, QOL, Others - Outcomes: PHQ 2/9: Depression screen [facility] 2/9 10/03/2017 01/05/2017  Decreased Interest 0 0  Down, Depressed, Hopeless 0 0  PHQ - 2 Score 0 0    Quality of Life:   Personal Goals: Goals established at orientation with interventions provided to work toward goal.    Personal Goals Discharge: Goals and Risk Factor Review    Row Name 12/20/17 1139 01/10/18 1250           Core Components/Risk Factors/Patient Goals Review   Personal Goals Review  Improve shortness of breath with ADL's;Develop more efficient breathing techniques such as purse lipped breathing and diaphragmatic breathing and practicing self-pacing with activity.  Improve shortness of breath with ADL's;Develop more efficient breathing techniques such as purse lipped breathing and diaphragmatic breathing and practicing self-pacing with activity.      Review  Continues to  do well, level 5 on nustep, level 3 on recumbent bike, treadmil 1.0 mph/0% grade, walking fast is difficult for him  Pt has completed 19 exercise sessions.  Pt last session was 7/2.  [place] cancer lesion.  Pt is hopeful to return this week.  Pt observed using PLB particular while ambulating on the track.  continue to review.      Expected Outcomes  see admission expected outcomes.  see admission expected outcomes.         Exercise Goals and Review:   Nutrition & Weight - Outcomes:    Nutrition: Nutrition Therapy & Goals - 01/27/18 1204      Nutrition Therapy   Diet  general healthful      Personal Nutrition Goals   Nutrition Goal  Describe the benefit of including fruits, vegetables, whole grains, and low-fat dairy products in a healthy meal plan.      Intervention Plan   Intervention  Prescribe, educate and counsel regarding  individualized specific dietary modifications aiming towards targeted core components such as weight, hypertension, lipid management, diabetes, heart failure and other comorbidities.    Expected Outcomes  Short Term Goal: Understand basic principles of dietary content, such as calories, fat, sodium, cholesterol and nutrients.       Nutrition Discharge: Nutrition Assessments - 01/27/18 1206      Rate Your Plate Scores   Pre Score  49    Post Score  --   survey not returned      Education Questionnaire Score:   Goals reviewed with patient. Cherre Huger, BSN Cardiac and Training and development officer

## 2018-01-31 DIAGNOSIS — Z510 Encounter for antineoplastic radiation therapy: Secondary | ICD-10-CM | POA: Diagnosis not present

## 2018-02-02 ENCOUNTER — Ambulatory Visit
Admission: RE | Admit: 2018-02-02 | Discharge: 2018-02-02 | Disposition: A | Discharge status: Home / Self Care | Payer: Medicare Other | Source: Ambulatory Visit | Attending: Radiation Oncology | Admitting: Radiation Oncology

## 2018-02-02 DIAGNOSIS — Z510 Encounter for antineoplastic radiation therapy: Secondary | ICD-10-CM | POA: Diagnosis not present

## 2018-02-03 ENCOUNTER — Ambulatory Visit (HOSPITAL_COMMUNITY)
Admission: EM | Admit: 2018-02-03 | Discharge: 2018-02-03 | Disposition: A | Discharge status: Home / Self Care | Payer: Medicare Other | Attending: Family Medicine | Admitting: Family Medicine

## 2018-02-03 ENCOUNTER — Encounter (HOSPITAL_COMMUNITY): Payer: Self-pay

## 2018-02-03 ENCOUNTER — Ambulatory Visit
Admission: RE | Admit: 2018-02-03 | Discharge: 2018-02-03 | Disposition: A | Discharge status: Home / Self Care | Payer: Medicare Other | Source: Ambulatory Visit | Attending: Radiation Oncology | Admitting: Radiation Oncology

## 2018-02-03 DIAGNOSIS — C44329 Squamous cell carcinoma of skin of other parts of face: Secondary | ICD-10-CM

## 2018-02-03 DIAGNOSIS — L130 Dermatitis herpetiformis: Secondary | ICD-10-CM | POA: Diagnosis not present

## 2018-02-03 DIAGNOSIS — Z510 Encounter for antineoplastic radiation therapy: Secondary | ICD-10-CM | POA: Diagnosis not present

## 2018-02-03 MED ORDER — SONAFINE EX EMUL
1.0000 "application " | Freq: Two times a day (BID) | CUTANEOUS | Status: DC
  Administered 2018-02-03: 1 via TOPICAL

## 2018-02-03 MED ORDER — VALACYCLOVIR HCL 1 G PO TABS: 1000.0000 mg | ORAL_TABLET | Freq: Three times a day (TID) | ORAL | 0 refills | Status: AC

## 2018-02-03 NOTE — ED Provider Notes (Signed)
[facility]   [id number] 02/03/18 Arrival Time: 0034  CC: SKIN COMPLAINT  SUBJECTIVE:  [name] is a 84 y.o. male who presents with a rash that began a couple days ago.  Denies changes in soaps, detergents, or anyone with similar symptoms.  Denies known trigger, environmental exposure or allergies, or recent travel.  Localizes the rash to bilateral palms of hands, L>R  Describes it as painful, red, and burning in character.  Has not tried OTC medications.  Symptoms are made worse with hand movement.  Denies similar symptoms in the past   Compla of associated swelling.  Denies fever, chills, nausea, vomiting, swollen glands, SOB, chest pain, abdominal pain, changes in bowel or bladder function.    Currently being treated for skin cancer of the scalp.  Not melanoma.  Had localized radiation to scalp that began yesterday.    ROS: As per HPI.  Past Medical History:  Diagnosis Date  . Arthritis    osteoarthritis  . Cancer ([place])    skin cancer only.-no melanoma.  Marland Kitchen COPD (chronic obstructive pulmonary disease) ([place])   . History of kidney stones    lithotripsy x1   . History of oxygen administration    Oxygen @ 2 l/m nasally bedtime  . Hyperlipidemia   . Hypothyroidism   . Pulmonary nodule, right   . 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 Tobacco abuse    Quit 09/17/10   Past Surgical History:  Procedure Laterality Date  . CATARACT EXTRACTION, BILATERAL    . CONVERSION TO TOTAL HIP Left 04/28/2015   Procedure: LEFT HIP CONVERSION PREVIOUS HIP SURGERY TO TOTAL HIP ARTHROPLASTY (ANTERIOR);  Surgeon: Paralee Cancel, MD;  Location: [facility];  Service: Orthopedics;  Laterality: Left;  . electronavigational bronchoscopy  07/2010  . HIP PINNING,CANNULATED Left 06/04/2014   Procedure: LEFT HIP REDUCTION WITH PERCUTANEOUS SCREWS;  Surgeon: Mauri Pole, MD;  Location: [facility];  Service: Orthopedics;  Laterality: Left;  . TRACHEOSTOMY     after aspiration event as a  84 y/o   Allergies  Allergen Reactions  . Sulfonamide Derivatives     Unknown - as a child   Current Facility-Administered Medications on File Prior to Encounter  Medication Dose Route Frequency Provider Last Rate Last Dose  . SONAFINE emulsion 1 application  1 application Topical BID Kyung Rudd, MD       Current Outpatient Medications on File Prior to Encounter  Medication Sig Dispense Refill  . albuterol (PROVENTIL HFA;VENTOLIN HFA) 108 (90 BASE) MCG/ACT inhaler Inhale 2 puffs into the lungs every 4 (four) hours as needed for wheezing or shortness of breath.    Marland Kitchen albuterol (PROVENTIL) (2.5 MG/3ML) 0.083% nebulizer solution USE 1 VIAL IN NEBULIZER EVERY 6 HOURS. Generic: VENTOLIN 120 vial 10  . guaiFENesin (MUCINEX) 600 MG 12 hr tablet Take 2 tablets (1,200 mg total) by mouth 2 (two) times daily as needed. (Patient not taking: Reported on 10/03/2017)    . levothyroxine (SYNTHROID, LEVOTHROID) 50 MCG tablet Take 50 mcg by mouth daily.    . predniSONE (DELTASONE) 5 MG tablet Take 0.5 tablets (2.5 mg total) by mouth daily with breakfast. (Patient taking differently: Take 2.5 mg by mouth every other day. Taking 1/2 tablet every other day) 30 tablet 3  . simvastatin (ZOCOR) 5 MG tablet Take 5 mg by mouth at bedtime.      . triamcinolone cream (KENALOG) 0.1 % Apply topically.     Social History  Socioeconomic History  . Marital status: Married    Spouse name: Not on file  . Number of children: Not on file  . Years of education: Not on file  . Highest education level: Not on file  Occupational History  . Not on file  Social Needs  . Financial resource strain: Not on file  . Food insecurity:    Worry: Not on file    Inability: Not on file  . Transportation needs:    Medical: Not on file    Non-medical: Not on file  Tobacco Use  . Smoking status: Former Smoker    Packs/day: 1.00    Years: 55.00    Pack years: 55.00    Types: Cigarettes    Last attempt to quit: 04/22/2011    Years  since quitting: 6.7  . Smokeless tobacco: Never Used  . Tobacco comment: occasionally smoked 2-3 times a week--1 cig each time  Substance and Sexual Activity  . Alcohol use: Yes    Comment: rare  social  . Drug use: No  . Sexual activity: Not on file  Lifestyle  . Physical activity:    Days per week: Not on file    Minutes per session: Not on file  . Stress: Not on file  Relationships  . Social connections:    Talks on phone: Not on file    Gets together: Not on file    Attends religious service: Not on file    Active member of club or organization: Not on file    Attends meetings of clubs or organizations: Not on file    Relationship status: Not on file  . Intimate partner violence:    Fear of current or ex partner: Not on file    Emotionally abused: Not on file    Physically abused: Not on file    Forced sexual activity: Not on file  Other Topics Concern  . Not on file  Social History Narrative  . Not on file   Family History  Problem Relation Age of Onset  . Cancer Neg Hx     OBJECTIVE: Vitals:   02/03/18 1502  BP: (!) 157/51  Pulse: 90  Resp: 19  Temp: 98.3 F (36.8 C)  TempSrc: Oral  SpO2: 97%    General appearance: alert; no distress Lungs: clear to auscultation bilaterally Heart: regular rate and rhythm.  Radial pulse 2+ bilaterally Extremities: no edema Skin: warm and dry; raised erythematous rash diffuse about the medial aspect of the left hand with mild involvement of left thenar aspect, and medial aspect of right hand; tender to palpation; no active drainage (see picture below) Psychological: alert and cooperative; normal mood and affect      ASSESSMENT & PLAN:  1. Dermatitis herpetiformis    Discussed patient case with Dr. Joseph Art.    Meds ordered this encounter  Medications  . valACYclovir (VALTREX) 1000 MG tablet    Sig: Take 1 tablet (1,000 mg total) by mouth 3 (three) times daily for 7 days.    Dispense:  21 tablet    Refill:  0      Order Specific Question:   Supervising Provider    Answer:   Wynona Luna [[id number]]   Prescribed valtrex.  Take as directed and to completion Follow up with dermatologist next week for evaluation and management of new rash Return or go to the ED if you have any new or worsening symptoms  Reviewed expectations re: course of current medical issues. Questions  answered. Outlined signs and symptoms indicating need for more acute intervention. Patient verbalized understanding. After Visit Summary given.   Lestine Box, PA-C 02/03/18 1615

## 2018-02-03 NOTE — Discharge Instructions (Addendum)
Prescribed valtrex.  Take as directed and to completion Follow up with dermatologist next week for evaluation and management of new rash Return or go to the ED if you have any new or worsening symptoms

## 2018-02-03 NOTE — ED Triage Notes (Signed)
Pt presents with lesions on left hand that pt states have a "burning' sensation to them

## 2018-02-04 ENCOUNTER — Encounter (HOSPITAL_COMMUNITY): Payer: Self-pay | Admitting: *Deleted

## 2018-02-04 ENCOUNTER — Emergency Department (HOSPITAL_COMMUNITY)
Admission: EM | Admit: 2018-02-04 | Discharge: 2018-02-04 | Disposition: A | Discharge status: Home / Self Care | Payer: Medicare Other | Attending: Emergency Medicine | Admitting: Emergency Medicine

## 2018-02-04 DIAGNOSIS — R21 Rash and other nonspecific skin eruption: Secondary | ICD-10-CM

## 2018-02-04 DIAGNOSIS — M79642 Pain in left hand: Secondary | ICD-10-CM | POA: Diagnosis present

## 2018-02-04 LAB — CBC WITH DIFFERENTIAL/PLATELET
BASOS ABS: 0 10*3/uL (ref 0.0–0.1)
BASOS PCT: 0 %
EOS PCT: 0 %
Eosinophils Absolute: 0 10*3/uL (ref 0.0–0.7)
HCT: 40.8 % (ref 39.0–52.0)
Hemoglobin: 13.8 g/dL (ref 13.0–17.0)
LYMPHS PCT: 7 %
Lymphs Abs: 0.7 10*3/uL (ref 0.7–4.0)
MCH: 30.9 pg (ref 26.0–34.0)
MCHC: 33.8 g/dL (ref 30.0–36.0)
MCV: 91.5 fL (ref 78.0–100.0)
MONO ABS: 1 10*3/uL (ref 0.1–1.0)
Monocytes Relative: 11 %
Neutro Abs: 7.9 10*3/uL — ABNORMAL HIGH (ref 1.7–7.7)
Neutrophils Relative %: 82 %
PLATELETS: 230 10*3/uL (ref 150–400)
RBC: 4.46 MIL/uL (ref 4.22–5.81)
RDW: 13.3 % (ref 11.5–15.5)
WBC: 9.6 10*3/uL (ref 4.0–10.5)

## 2018-02-04 LAB — BASIC METABOLIC PANEL
Anion gap: 10 (ref 5–15)
BUN: 20 mg/dL (ref 8–23)
CALCIUM: 8.9 mg/dL (ref 8.9–10.3)
CO2: 25 mmol/L (ref 22–32)
CREATININE: 1.4 mg/dL — AB (ref 0.61–1.24)
Chloride: 101 mmol/L (ref 98–111)
GFR calc Af Amer: 50 mL/min — ABNORMAL LOW (ref >60)
GFR, EST NON AFRICAN AMERICAN: 43 mL/min — AB (ref >60)
GLUCOSE: 121 mg/dL — AB (ref 70–99)
POTASSIUM: 4.5 mmol/L (ref 3.5–5.1)
Sodium: 136 mmol/L (ref 135–145)

## 2018-02-04 LAB — I-STAT CG4 LACTIC ACID, ED
LACTIC ACID, VENOUS: 0.97 mmol/L (ref 0.5–1.9)
Lactic Acid, Venous: 0.95 mmol/L (ref 0.5–1.9)

## 2018-02-04 MED ORDER — HYDROCODONE-ACETAMINOPHEN 5-325 MG PO TABS: 1.0000 | ORAL_TABLET | Freq: Four times a day (QID) | ORAL | 0 refills | Status: DC | PRN

## 2018-02-04 MED ORDER — HYDROCODONE-ACETAMINOPHEN 5-325 MG PO TABS
1.0000 | ORAL_TABLET | Freq: Once | ORAL | Status: AC
  Administered 2018-02-04: 1 via ORAL
  Filled 2018-02-04: qty 1

## 2018-02-04 MED ORDER — HYDROMORPHONE HCL 1 MG/ML IJ SOLN
0.5000 mg | Freq: Once | INTRAMUSCULAR | Status: AC
Start: 2018-02-04 — End: 2018-02-04
  Administered 2018-02-04: 0.5 mg via INTRAVENOUS
  Filled 2018-02-04: qty 1

## 2018-02-04 MED ORDER — METHYLPREDNISOLONE SODIUM SUCC 125 MG IJ SOLR
125.0000 mg | Freq: Once | INTRAMUSCULAR | Status: AC
  Administered 2018-02-04: 125 mg via INTRAVENOUS
  Filled 2018-02-04: qty 2

## 2018-02-04 MED ORDER — MORPHINE SULFATE (PF) 4 MG/ML IV SOLN
4.0000 mg | Freq: Once | INTRAVENOUS | Status: AC
  Administered 2018-02-04: 4 mg via INTRAVENOUS
  Filled 2018-02-04: qty 1

## 2018-02-04 MED ORDER — PREDNISONE 20 MG PO TABS: 40.0000 mg | ORAL_TABLET | Freq: Every day | ORAL | 0 refills | Status: AC

## 2018-02-04 NOTE — ED Provider Notes (Signed)
[facility] DEPT Provider Note   CSN: [id number] Arrival date & time: 02/04/18  1725     History   Chief Complaint Chief Complaint  Patient presents with  . Rash    HPI [name] is a 84 y.o. male past medical history of arthritis, skin cancer, COPD, hyperlipidemia, hypothyroidism who presents for evaluation of worsening pain, redness, swelling to left hand and worsening lesions to bilateral hands.  Patient reports that approximately 5 days ago, he started developing small red papules and nodules noted to his left hand.  He states that he had some on the palms.  Additionally had some purplish discoloration of his lateral aspect of his hand.  He reports that this worsened and he went to urgent care yesterday.  He was seen in urgent care yesterday and was diagnosed with dermatitis herpetiformis and was discharged home on Valtrex which he states he started last night.  Wife reports that last night, his left hand started becoming more swollen, red, painful.  Additionally, she states that the nodule started increasing in the area of purple discoloration started getting larger.  Additionally, she reports that he started having areas noted to the right palm which she states have been new.  Patient states that the left hand is very painful and hurts to move it.  He he has not had any recent insect bites, injuries.  He has not had any recent new exposures, lotions, detergents.  He does state that he started radiation therapy 3 days ago but that the symptoms started before the radiation.  Otherwise denies any new medications.  Patient reports he has some chronic shortness of breath secondary to his COPD but denies any changes.  Patient denies any fevers, numbness/weakness, chest pain, abdominal pain, nausea/vomiting.  The history is provided by the patient.    Past Medical History:  Diagnosis Date  . Arthritis    osteoarthritis  . Cancer ([place])    skin cancer  only.-no melanoma.  Marland Kitchen COPD (chronic obstructive pulmonary disease) ([place])   . History of kidney stones    lithotripsy x1   . History of oxygen administration    Oxygen @ 2 l/m nasally bedtime  . Hyperlipidemia   . Hypothyroidism   . Pulmonary nodule, right   . 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 Tobacco abuse    Quit 09/17/10    Patient Active Problem List   Diagnosis Date Noted  . 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 Overweight (BMI 25.0-29.9) 04/30/2015  . S/P left hip conversion to [facility] 04/28/2015  . Fracture of femoral neck, left ([place]) 06/07/2014  . Hypothyroidism 06/04/2014  . Closed left hip fracture ([place]) 06/03/2014  . COPD GOLD IV 06/03/2014  . Hyperlipidemia 06/03/2014  . Hip fracture ([place]) 06/03/2014  . Chronic respiratory failure with hypoxia ([place]) 10/27/2013  . COPD with emphysema ([place]) 07/03/2007    Past Surgical History:  Procedure Laterality Date  . CATARACT EXTRACTION, BILATERAL    . CONVERSION TO TOTAL HIP Left 04/28/2015   Procedure: LEFT HIP CONVERSION PREVIOUS HIP SURGERY TO TOTAL HIP ARTHROPLASTY (ANTERIOR);  Surgeon: Paralee Cancel, MD;  Location: [facility];  Service: Orthopedics;  Laterality: Left;  . electronavigational bronchoscopy  07/2010  . HIP PINNING,CANNULATED Left 06/04/2014   Procedure: LEFT HIP REDUCTION WITH PERCUTANEOUS SCREWS;  Surgeon: Mauri Pole, MD;  Location: [facility];  Service: Orthopedics;  Laterality: Left;  .  TRACHEOSTOMY     after aspiration event as a 84 y/o        Home Medications    Prior to Admission medications   Medication Sig Start Date End Date Taking? Authorizing Provider  albuterol (PROVENTIL) (2.5 MG/3ML) 0.083% nebulizer solution USE 1 VIAL IN NEBULIZER EVERY 6 HOURS. Generic: VENTOLIN 07/21/17  Yes Sood, Elisabeth Cara, MD  levothyroxine (SYNTHROID, LEVOTHROID) 50 MCG tablet Take 50 mcg by mouth every evening.    Yes [provider]    simvastatin (ZOCOR) 5 MG tablet Take 5 mg by mouth at bedtime.     Yes [provider]  triamcinolone cream (KENALOG) 0.1 % Apply 1 application topically 2 (two) times daily.  08/24/17  Yes [provider]  valACYclovir (VALTREX) 1000 MG tablet Take 1 tablet (1,000 mg total) by mouth 3 (three) times daily for 7 days. 02/03/18 02/10/18 Yes Wurst, [place], PA-C  albuterol (PROVENTIL HFA;VENTOLIN HFA) 108 (90 BASE) MCG/ACT inhaler Inhale 2 puffs into the lungs every 4 (four) hours as needed for wheezing or shortness of breath.    [provider]  guaiFENesin (MUCINEX) 600 MG 12 hr tablet Take 2 tablets (1,200 mg total) by mouth 2 (two) times daily as needed. Patient not taking: Reported on 10/03/2017 07/29/16   [name], MD  HYDROcodone-acetaminophen (NORCO/VICODIN) 5-325 MG tablet Take 1-2 tablets by mouth every 6 (six) hours as needed. 02/04/18   Volanda Napoleon, PA-C  predniSONE (DELTASONE) 20 MG tablet Take 2 tablets (40 mg total) by mouth daily for 4 days. 02/04/18 02/08/18  Volanda Napoleon, PA-C    Family History Family History  Problem Relation Age of Onset  . Cancer Neg Hx     Social History Social History   Tobacco Use  . Smoking status: Former Smoker    Packs/day: 1.00    Years: 55.00    Pack years: 55.00    Types: Cigarettes    Last attempt to quit: 04/22/2011    Years since quitting: 6.7  . Smokeless tobacco: Never Used  . Tobacco comment: occasionally smoked 2-3 times a week--1 cig each time  Substance Use Topics  . Alcohol use: Yes    Comment: rare  social  . Drug use: No     Allergies   Sulfonamide derivatives   Review of Systems Review of Systems  Constitutional: Negative for fever.  Respiratory: Positive for shortness of breath (Chronic). Negative for cough.   Cardiovascular: Negative for chest pain.  Gastrointestinal: Negative for abdominal pain, nausea and vomiting.  Genitourinary: Negative for dysuria and hematuria.   Musculoskeletal:       Hand pain and swelling  Skin: Positive for color change and rash.  Neurological: Negative for weakness, numbness and headaches.  All other systems reviewed and are negative.    Physical Exam Updated Vital Signs BP (!) 142/75   Pulse 80   Temp 97.7 F (36.5 C) (Oral)   Resp 17   Ht 5\' 10"  (1.778 m)   Wt 79.8 kg   SpO2 100%   BMI 25.25 kg/m   Physical Exam  Constitutional: He is oriented to person, place, and time. He appears well-developed and well-nourished.  HENT:  Head: Normocephalic and atraumatic.  Mouth/Throat: Oropharynx is clear and moist and mucous membranes are normal.  No oral lesions  Eyes: Pupils are equal, round, and reactive to light. Conjunctivae, EOM and lids are normal.  Neck: Full passive range of motion without pain.  Cardiovascular: Normal rate, regular rhythm, normal heart  sounds and normal pulses. Exam reveals no gallop and no friction rub.  No murmur heard. Pulses:      Radial pulses are 2+ on the right side, and 2+ on the left side.       Dorsalis pedis pulses are 2+ on the right side, and 2+ on the left side.  No m/r/g  Pulmonary/Chest: Effort normal and breath sounds normal.  Abdominal: Soft. Normal appearance. There is no tenderness. There is no rigidity and no guarding.  Musculoskeletal: Normal range of motion.  Neurological: He is alert and oriented to person, place, and time.  Skin: Skin is warm and dry. Capillary refill takes less than 2 seconds.  Left hand is edematous and erythematous which begins at the proximal hand and extends distally to all 5 fingers.  He has multiple scattered erythematous papules and nodules noted to the palm of his left hand.  He has a confluent area of purpura noted to the ulnar aspect of his hand that extends distally towards his fifth digit.  Areas are nonblanching.  He has scattered erythematous papules and nodules noted to the palm of right hand.  He has scattered erythematous papules noted  to the soles of his bilateral feet.  No rash noted on abdomen, chest, back. Good distal cap refill. BUE are not dusky in appearance or cool to touch.  Psychiatric: He has a normal mood and affect. His speech is normal.  Nursing note and vitals reviewed.              ED Treatments / Results  Labs (all labs ordered are listed, but only abnormal results are displayed) Labs Reviewed  BASIC METABOLIC PANEL - Abnormal; Notable for the following components:      Result Value   Glucose, Bld 121 (*)    Creatinine, Ser 1.40 (*)    GFR calc non Af Amer 43 (*)    GFR calc Af Amer 50 (*)    All other components within normal limits  CBC WITH DIFFERENTIAL/PLATELET - Abnormal; Notable for the following components:   Neutro Abs 7.9 (*)    All other components within normal limits  RPR  ROCKY MTN SPOTTED FVR ABS PNL(IGG+IGM)  I-STAT CG4 LACTIC ACID, ED  I-STAT CG4 LACTIC ACID, ED    EKG None  Radiology No results found.  Procedures Procedures (including critical care time)  Medications Ordered in ED Medications  morphine 4 MG/ML injection 4 mg (4 mg Intravenous Given 02/04/18 1854)  methylPREDNISolone sodium succinate (SOLU-MEDROL) 125 mg/2 mL injection 125 mg (125 mg Intravenous Given 02/04/18 1900)  HYDROmorphone (DILAUDID) injection 0.5 mg (0.5 mg Intravenous Given 02/04/18 2144)  HYDROcodone-acetaminophen (NORCO/VICODIN) 5-325 MG per tablet 1 tablet (1 tablet Oral Given 02/04/18 2321)     Initial Impression / Assessment and Plan / ED Course  I have reviewed the triage vital signs and the nursing notes.  Pertinent labs & imaging results that were available during my care of the patient were reviewed by me and considered in my medical decision making (see chart for details).     84 year-old male who presents for evaluation of rash to bilateral upper extremities.  Reports rash started approximately 5 days ago.  Was seen in urgent care yesterday was diagnosed with dermatitis  herpetiformis.  Was discharged home on Valtrex.  Reports that pain, redness, swelling worsened overnight.  Additionally, he reports that he has had some same rash noted to the right palm.  No fevers noted at home. Patient is  afebril, non-toxic appearing, sitting comfortably on examination table. Vital signs reviewed and stable. Patient is neurovascularly intact.  On exam, he has diffuse erythema and warmth noted to the left hand.  Additionally, he has scattered erythematous papules noted to the palm of his left hand and an area of confluent purpura noted to the lateral aspect of right hand.  Additionally, he has erythematous papules noted to the palm of right hand.  Valuation of his feet shows scattered red papules noted to the soles of feet.  No other rash noted.  No oral lesions.  Consider vasculitis versus hand-foot-and-mouth.  Also consider syphilis.  Low suspicion for recommended fever but also consideration.  Low suspicion for cellulitis.  Exam is not concerning for acute caliber, septic emboli.  Exam does not look suspicious for offer nodes or Janeway lesions that would indicate endocarditis.  Additionally, patient has no fever, no murmur heard on exam.  We will plan to provide analgesics and steroids given possible inflammatory cause.  I-STAT lactic acid negative.  CBC without any significant leukocytosis or anemia.  BMP shows creatinine is slightly elevated at 1.40.  Otherwise unremarkable.  RPR and recommended spotted fever in process.  Reevaluation after analgesics.  Patient reports improvement in pain.  On repeat evaluation, some of the erythema and edema of the left hand has improved significantly after prednisone and analgesics here in the department.  Discussed at length with patient regarding treatment options.  Patient has an outpatient appointment with his dermatologist scheduled on 02/06/18.  He reports his pain is improved significantly here in the ED.  His vital signs are stable.   Additionally, he has a reassuring work-up.  I discussed outpatient treatment versus observation admission.  Engaged in shared decision-making with both patient and wife.  Patient and wife wish to be discharged home and plan to follow-up with outpatient dermatology.  Given reassuring work-up, I feel that this is reasonable. Discussed patient with Dr. Vanita Panda who independently evaluated the patient and is agreeable to plan. Will plan to send patient home with pain medication, steroids.  Instructed patient to follow-up with his dermatologist as previously scheduled. Patient had ample opportunity for questions and discussion. All patient's questions were answered with full understanding. Strict return precautions discussed. Patient expresses understanding and agreement to plan.   Final Clinical Impressions(s) / ED Diagnoses   Final diagnoses:  Rash    ED Discharge Orders         Ordered    predniSONE (DELTASONE) 20 MG tablet  Daily     02/04/18 2321    HYDROcodone-acetaminophen (NORCO/VICODIN) 5-325 MG tablet  Every 6 hours PRN     02/04/18 2321           Volanda Napoleon, PA-C 02/05/18 0004    Carmin Muskrat, MD 02/05/18 1724

## 2018-02-04 NOTE — Discharge Instructions (Signed)
You can take 1000 mg of Tylenol.  Do not exceed 4000 mg of Tylenol a day.  He can take pain medication for severe breakthrough pain.  Take prednisone as directed.  Follow-up with your dermatologist on Monday as previously scheduled.  Return to emergency department for any worsening pain, redness or swelling that began spreading down the hand, worsening rash, fever or any other worsening or concerning symptoms.

## 2018-02-04 NOTE — ED Triage Notes (Signed)
Pt was seen at [facility] urgent care yesterday, diagnosed with dermatitis herpetiformis and prescribed valtrex. Pt states symptoms have worsened. Pt has redness and swelling to left hand and lesions beginning to appear on right hand.

## 2018-02-04 NOTE — ED Notes (Signed)
Pt would like POC Lactic drawn from iv

## 2018-02-05 ENCOUNTER — Ambulatory Visit: Admission: RE | Admit: 2018-02-05 | Payer: Medicare Other | Source: Ambulatory Visit

## 2018-02-05 LAB — RPR: RPR Ser Ql: NONREACTIVE

## 2018-02-06 ENCOUNTER — Ambulatory Visit: Payer: Medicare Other

## 2018-02-07 ENCOUNTER — Ambulatory Visit
Admission: RE | Admit: 2018-02-07 | Discharge: 2018-02-07 | Disposition: A | Discharge status: Home / Self Care | Payer: Medicare Other | Source: Ambulatory Visit | Attending: Radiation Oncology | Admitting: Radiation Oncology

## 2018-02-07 DIAGNOSIS — Z510 Encounter for antineoplastic radiation therapy: Secondary | ICD-10-CM | POA: Diagnosis not present

## 2018-02-07 LAB — ROCKY MTN SPOTTED FVR ABS PNL(IGG+IGM)
RMSF IGG: NEGATIVE
RMSF IgM: 0.24 index (ref 0.00–0.89)

## 2018-02-08 ENCOUNTER — Ambulatory Visit
Admission: RE | Admit: 2018-02-08 | Discharge: 2018-02-08 | Disposition: A | Discharge status: Home / Self Care | Payer: Medicare Other | Source: Ambulatory Visit | Attending: Radiation Oncology | Admitting: Radiation Oncology

## 2018-02-08 DIAGNOSIS — Z510 Encounter for antineoplastic radiation therapy: Secondary | ICD-10-CM | POA: Diagnosis not present

## 2018-02-09 ENCOUNTER — Ambulatory Visit
Admission: RE | Admit: 2018-02-09 | Discharge: 2018-02-09 | Disposition: A | Discharge status: Home / Self Care | Payer: Medicare Other | Source: Ambulatory Visit | Attending: Radiation Oncology | Admitting: Radiation Oncology

## 2018-02-09 DIAGNOSIS — Z510 Encounter for antineoplastic radiation therapy: Secondary | ICD-10-CM | POA: Diagnosis not present

## 2018-02-10 ENCOUNTER — Ambulatory Visit
Admission: RE | Admit: 2018-02-10 | Discharge: 2018-02-10 | Disposition: A | Discharge status: Home / Self Care | Payer: Medicare Other | Source: Ambulatory Visit | Attending: Radiation Oncology | Admitting: Radiation Oncology

## 2018-02-10 DIAGNOSIS — Z510 Encounter for antineoplastic radiation therapy: Secondary | ICD-10-CM | POA: Diagnosis not present

## 2018-02-13 ENCOUNTER — Ambulatory Visit
Admission: RE | Admit: 2018-02-13 | Discharge: 2018-02-13 | Disposition: A | Discharge status: Home / Self Care | Payer: Medicare Other | Source: Ambulatory Visit | Attending: Radiation Oncology | Admitting: Radiation Oncology

## 2018-02-13 ENCOUNTER — Other Ambulatory Visit: Payer: Self-pay | Admitting: Pulmonary Disease

## 2018-02-13 DIAGNOSIS — Z510 Encounter for antineoplastic radiation therapy: Secondary | ICD-10-CM | POA: Diagnosis not present

## 2018-02-13 NOTE — Progress Notes (Signed)
  Radiation Oncology         [phone] ________________________________  Name: [name] MRN: [id number]  Date: 01/27/2018  DOB: 1928-05-28  SIMULATION AND TREATMENT PLANNING NOTE  DIAGNOSIS:     ICD-10-CM   1. 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  Identity was confirmed.  All relevant records and images related to the planned course of therapy were reviewed.   Written consent to proceed with treatment was confirmed which was freely given after reviewing the details related to the planned course of therapy had been reviewed with the patient.  Then, the patient was set-up in a stable reproducible  supine position for radiation therapy.  CT images were obtained.     Then the target was outlined .   A total of 1 complex treatment devices were fabricated which relate to the designed radiation treatment fields, an en face electron field. A special port plan is requested and the patient's treatment will be prescribed to the appropriate dose    PLAN:  The patient will receive 55 Gy 22 fractions.     Jodelle Gross, MD, PhD

## 2018-02-14 ENCOUNTER — Ambulatory Visit
Admission: RE | Admit: 2018-02-14 | Discharge: 2018-02-14 | Disposition: A | Discharge status: Home / Self Care | Payer: Medicare Other | Source: Ambulatory Visit | Attending: Radiation Oncology | Admitting: Radiation Oncology

## 2018-02-14 DIAGNOSIS — Z510 Encounter for antineoplastic radiation therapy: Secondary | ICD-10-CM | POA: Diagnosis not present

## 2018-02-15 ENCOUNTER — Ambulatory Visit
Admission: RE | Admit: 2018-02-15 | Discharge: 2018-02-15 | Disposition: A | Discharge status: Home / Self Care | Payer: Medicare Other | Source: Ambulatory Visit | Attending: Radiation Oncology | Admitting: Radiation Oncology

## 2018-02-15 DIAGNOSIS — Z510 Encounter for antineoplastic radiation therapy: Secondary | ICD-10-CM | POA: Diagnosis not present

## 2018-02-16 ENCOUNTER — Ambulatory Visit
Admission: RE | Admit: 2018-02-16 | Discharge: 2018-02-16 | Disposition: A | Discharge status: Home / Self Care | Payer: Medicare Other | Source: Ambulatory Visit | Attending: Radiation Oncology | Admitting: Radiation Oncology

## 2018-02-16 DIAGNOSIS — Z510 Encounter for antineoplastic radiation therapy: Secondary | ICD-10-CM | POA: Diagnosis not present

## 2018-02-17 ENCOUNTER — Ambulatory Visit
Admission: RE | Admit: 2018-02-17 | Discharge: 2018-02-17 | Disposition: A | Discharge status: Home / Self Care | Payer: Medicare Other | Source: Ambulatory Visit | Attending: Radiation Oncology | Admitting: Radiation Oncology

## 2018-02-17 DIAGNOSIS — Z510 Encounter for antineoplastic radiation therapy: Secondary | ICD-10-CM | POA: Diagnosis not present

## 2018-02-21 ENCOUNTER — Ambulatory Visit
Admission: RE | Admit: 2018-02-21 | Discharge: 2018-02-21 | Disposition: A | Discharge status: Home / Self Care | Payer: Medicare Other | Source: Ambulatory Visit | Attending: Radiation Oncology | Admitting: Radiation Oncology

## 2018-02-21 DIAGNOSIS — C44329 Squamous cell carcinoma of skin of other parts of face: Secondary | ICD-10-CM | POA: Diagnosis not present

## 2018-02-21 DIAGNOSIS — Z510 Encounter for antineoplastic radiation therapy: Secondary | ICD-10-CM | POA: Insufficient documentation

## 2018-02-22 ENCOUNTER — Ambulatory Visit
Admission: RE | Admit: 2018-02-22 | Discharge: 2018-02-22 | Disposition: A | Discharge status: Home / Self Care | Payer: Medicare Other | Source: Ambulatory Visit | Attending: Radiation Oncology | Admitting: Radiation Oncology

## 2018-02-22 DIAGNOSIS — Z510 Encounter for antineoplastic radiation therapy: Secondary | ICD-10-CM | POA: Diagnosis not present

## 2018-02-23 ENCOUNTER — Ambulatory Visit
Admission: RE | Admit: 2018-02-23 | Discharge: 2018-02-23 | Disposition: A | Discharge status: Home / Self Care | Payer: Medicare Other | Source: Ambulatory Visit | Attending: Radiation Oncology | Admitting: Radiation Oncology

## 2018-02-23 DIAGNOSIS — Z510 Encounter for antineoplastic radiation therapy: Secondary | ICD-10-CM | POA: Diagnosis not present

## 2018-02-24 ENCOUNTER — Ambulatory Visit
Admission: RE | Admit: 2018-02-24 | Discharge: 2018-02-24 | Disposition: A | Discharge status: Home / Self Care | Payer: Medicare Other | Source: Ambulatory Visit | Attending: Radiation Oncology | Admitting: Radiation Oncology

## 2018-02-24 DIAGNOSIS — Z510 Encounter for antineoplastic radiation therapy: Secondary | ICD-10-CM | POA: Diagnosis not present

## 2018-02-27 ENCOUNTER — Ambulatory Visit
Admission: RE | Admit: 2018-02-27 | Discharge: 2018-02-27 | Disposition: A | Discharge status: Home / Self Care | Payer: Medicare Other | Source: Ambulatory Visit | Attending: Radiation Oncology | Admitting: Radiation Oncology

## 2018-02-27 ENCOUNTER — Ambulatory Visit: Payer: Medicare Other

## 2018-02-27 DIAGNOSIS — Z510 Encounter for antineoplastic radiation therapy: Secondary | ICD-10-CM | POA: Diagnosis not present

## 2018-02-28 ENCOUNTER — Ambulatory Visit
Admission: RE | Admit: 2018-02-28 | Discharge: 2018-02-28 | Disposition: A | Discharge status: Home / Self Care | Payer: Medicare Other | Source: Ambulatory Visit | Attending: Radiation Oncology | Admitting: Radiation Oncology

## 2018-02-28 ENCOUNTER — Ambulatory Visit: Payer: Medicare Other

## 2018-02-28 DIAGNOSIS — Z510 Encounter for antineoplastic radiation therapy: Secondary | ICD-10-CM | POA: Diagnosis not present

## 2018-03-01 ENCOUNTER — Ambulatory Visit
Admission: RE | Admit: 2018-03-01 | Discharge: 2018-03-01 | Disposition: A | Discharge status: Home / Self Care | Payer: Medicare Other | Source: Ambulatory Visit | Attending: Radiation Oncology | Admitting: Radiation Oncology

## 2018-03-01 DIAGNOSIS — Z510 Encounter for antineoplastic radiation therapy: Secondary | ICD-10-CM | POA: Diagnosis not present

## 2018-03-02 ENCOUNTER — Ambulatory Visit
Admission: RE | Admit: 2018-03-02 | Discharge: 2018-03-02 | Disposition: A | Discharge status: Home / Self Care | Payer: Medicare Other | Source: Ambulatory Visit | Attending: Radiation Oncology | Admitting: Radiation Oncology

## 2018-03-02 DIAGNOSIS — Z510 Encounter for antineoplastic radiation therapy: Secondary | ICD-10-CM | POA: Diagnosis not present

## 2018-03-03 ENCOUNTER — Ambulatory Visit
Admission: RE | Admit: 2018-03-03 | Discharge: 2018-03-03 | Disposition: A | Discharge status: Home / Self Care | Payer: Medicare Other | Source: Ambulatory Visit | Attending: Radiation Oncology | Admitting: Radiation Oncology

## 2018-03-03 DIAGNOSIS — Z510 Encounter for antineoplastic radiation therapy: Secondary | ICD-10-CM | POA: Diagnosis not present

## 2018-03-06 ENCOUNTER — Ambulatory Visit
Admission: RE | Admit: 2018-03-06 | Discharge: 2018-03-06 | Disposition: A | Discharge status: Home / Self Care | Payer: Medicare Other | Source: Ambulatory Visit | Attending: Radiation Oncology | Admitting: Radiation Oncology

## 2018-03-06 ENCOUNTER — Ambulatory Visit: Payer: Medicare Other

## 2018-03-06 DIAGNOSIS — Z510 Encounter for antineoplastic radiation therapy: Secondary | ICD-10-CM | POA: Diagnosis not present

## 2018-03-07 ENCOUNTER — Ambulatory Visit
Admission: RE | Admit: 2018-03-07 | Discharge: 2018-03-07 | Disposition: A | Discharge status: Home / Self Care | Payer: Medicare Other | Source: Ambulatory Visit | Attending: Radiation Oncology | Admitting: Radiation Oncology

## 2018-03-07 DIAGNOSIS — Z510 Encounter for antineoplastic radiation therapy: Secondary | ICD-10-CM | POA: Diagnosis not present

## 2018-03-27 ENCOUNTER — Other Ambulatory Visit: Payer: Self-pay

## 2018-03-27 ENCOUNTER — Encounter: Payer: Self-pay | Admitting: Radiation Oncology

## 2018-03-27 ENCOUNTER — Ambulatory Visit
Admission: RE | Admit: 2018-03-27 | Discharge: 2018-03-27 | Disposition: A | Discharge status: Home / Self Care | Payer: Non-veteran care | Source: Ambulatory Visit | Attending: Radiation Oncology | Admitting: Radiation Oncology

## 2018-03-27 VITALS — BP 132/77 | HR 93 | Temp 98.0°F | Resp 20 | Ht 70.0 in | Wt 174.4 lb

## 2018-03-27 DIAGNOSIS — Z923 Personal history of irradiation: Secondary | ICD-10-CM | POA: Diagnosis not present

## 2018-03-27 DIAGNOSIS — Z79899 Other long term (current) drug therapy: Secondary | ICD-10-CM | POA: Insufficient documentation

## 2018-03-27 DIAGNOSIS — Z882 Allergy status to sulfonamides status: Secondary | ICD-10-CM | POA: Insufficient documentation

## 2018-03-27 DIAGNOSIS — C44329 Squamous cell carcinoma of skin of other parts of face: Secondary | ICD-10-CM | POA: Diagnosis not present

## 2018-03-27 NOTE — Addendum Note (Signed)
Encounter addended by: Malena Edman, RN on: 03/27/2018 10:40 AM  Actions taken: Charge Capture section accepted

## 2018-03-27 NOTE — Progress Notes (Signed)
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  10/13/16-11/11/16: Left pinna treated to 55 Gy in 22 fractions  Narrative:  The patient returns today for routine follow-up. [name] is a pleasant 84 y.o. gentleman who has been followed by Dr. Shon [name], of Dermatology at [facility], for the past 5 years for a history of non-melanoma skin cancer. He has had numerous skin excisions for suspicious lesions, particularly on his face.He had previous Moh's procedure with Dr. Harvel [name] in October 2017. In follow up withDr. Shon [name] on 08/26/16, he mentioned concerns with soreness in his left ear, and was found to havescaling papules on his forehead, nose, left cheek, left leg, and left ear. Shave biopsies of the forehead, nose, and left ear were obtained at that time. Biopsy of the forehead revealed basal cell carcinoma (nodular type), biopsy of the nose revealed actinic keratosis, and biopsy of the left ear revealed invasive squamous cell carcinoma (extending to the deep margin). He presented to Dr. Harvel [name] of the [facility] on 09/06/16 who performed shave biopsy of the left malar area revealing inflamed seborrheic keratosis,and mohs surgery of the left crus of anthelix revealing extensive perineural invasive by squamous cell carcinoma. He went on to receive adjuvant radiotherapy to the left pinna, and tolerated radiotherapy very well. He followed up with Dr. Harvel [name] recently and an area along his left forehead was of concern. The lesion was excised and the specimen was 4.3 x 1.8 x 0.4 cm and had infiltrating squamous cell  carcinoma with perineural invasion extending to the lateral margin, and the maximum diameter of the involved nerve was .2 mm. He proceeded with Mohs procedure, and given the risk of local recurrence completed radiotherapy to the scalp.                             On review of systems, the patient states he is doing great and denies any concerns.   ALLERGIES:  is allergic to sulfonamide derivatives.  Meds: Current Outpatient Medications  Medication Sig Dispense Refill  . albuterol (PROVENTIL HFA;VENTOLIN HFA) 108 (90 BASE) MCG/ACT inhaler Inhale 2 puffs into the lungs every 4 (four) hours as needed for wheezing or shortness of breath.    Marland Kitchen albuterol (PROVENTIL) (2.5 MG/3ML) 0.083% nebulizer solution USE 1 VIAL IN NEBULIZER EVERY 6 HOURS. Generic: VENTOLIN 120 vial 10  . levothyroxine (SYNTHROID, LEVOTHROID) 50 MCG tablet Take 50 mcg by mouth every evening.     . predniSONE (DELTASONE) 5 MG tablet TAKE 1 TABLET (5 MG TOTAL) BY MOUTH DAILY WITH BREAKFAST. (Patient taking differently: Taking 5 mg 1/2 tablet every other day in the morning) 30 tablet 3  . simvastatin (ZOCOR) 5 MG tablet Take 5 mg by mouth at bedtime.      . triamcinolone cream (KENALOG) 0.1 % Apply 1 application topically 2 (two) times daily.     Marland Kitchen guaiFENesin (MUCINEX) 600 MG 12 hr tablet Take 2 tablets (1,200 mg total) by mouth 2 (two) times daily as needed. (Patient not taking: Reported on 10/03/2017)    . HYDROcodone-acetaminophen (NORCO/VICODIN) 5-325  MG tablet Take 1-2 tablets by mouth every 6 (six) hours as needed. (Patient not taking: Reported on 03/27/2018) 10 tablet 0   No current facility-administered medications for this encounter.     Physical Findings:  height is 5\' 10"  (1.778 m) and weight is 174 lb 6.4 oz (79.1 kg). His oral temperature is 98 F (36.7 C). His blood pressure is 132/77 and his pulse is 93. His respiration is 20 and oxygen saturation is 95%.  Pain Assessment Pain Score: 0-No pain/10 In general this is  a well appearing caucasian male in no acute distress. He's alert and oriented x4 and appropriate throughout the examination. Cardiopulmonary assessment is negative for acute distress and he exhibits normal effort. His skin is improved and his wound is significantly smaller see below.   Today's assessment:   01/24/18 Evaluation:    Lab Findings: Lab Results  Component Value Date   WBC 9.6 02/04/2018   HGB 13.8 02/04/2018   HCT 40.8 02/04/2018   MCV 91.5 02/04/2018   PLT 230 02/04/2018     Radiographic Findings: No results found.  Impression/Plan: 1.  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 He is doing great and will follow up with [place] PRN. He will continue to follow with Dr. Shon [name] and Dr. Harvel [name]. He will also be followed for his prior history of left pinna SCC.     Carola Rhine, PAC

## 2018-04-21 ENCOUNTER — Encounter: Payer: Self-pay | Admitting: Radiation Oncology

## 2018-04-21 NOTE — Progress Notes (Signed)
  Radiation Oncology         (336) [phone] ________________________________  Name: [name] MRN: [id number]  Date: 04/21/2018  DOB: Oct 23, 1927  End of Treatment Note  Diagnosis:   Squamous cell carcinoma of theleft foreheadwith perineural invasion     Indication for treatment::  curative       Radiation treatment dates:   02/02/2018 - 03/07/2018  Site/dose:   Left forehead received 55.00 Gy in 22 fractions using 6e electrons with a clinical set-up technique.  Narrative: The patient tolerated radiation treatment relatively well. Moist desquamation was not present at the end of treatment. He experienced moderate erythema and fatigue.    Plan: The patient has completed radiation treatment. The patient will return to radiation oncology clinic for routine followup in one month. I advised the patient to call or return sooner if they have any questions or concerns related to their recovery or treatment. ________________________________  Jodelle Gross, M.D., Ph.D.   This document serves as a record of services personally performed by Kyung Rudd, MD. It was created on his behalf by Wilburn Mylar, a trained medical scribe. The creation of this record is based on the scribe's personal observations and the provider's statements to them. This document has been checked and approved by the attending provider.

## 2018-05-20 ENCOUNTER — Emergency Department (HOSPITAL_COMMUNITY): Payer: Medicare Other

## 2018-05-20 ENCOUNTER — Encounter (HOSPITAL_COMMUNITY): Payer: Self-pay

## 2018-05-20 ENCOUNTER — Inpatient Hospital Stay (HOSPITAL_COMMUNITY)
Admission: EM | Admit: 2018-05-20 | Discharge: 2018-05-24 | DRG: 190 | Disposition: A | Discharge status: Home Health | Payer: Medicare Other | Attending: Student in an Organized Health Care Education/Training Program | Admitting: Student in an Organized Health Care Education/Training Program

## 2018-05-20 DIAGNOSIS — Z96642 Presence of left artificial hip joint: Secondary | ICD-10-CM | POA: Diagnosis present

## 2018-05-20 DIAGNOSIS — I951 Orthostatic hypotension: Secondary | ICD-10-CM | POA: Diagnosis not present

## 2018-05-20 DIAGNOSIS — D72829 Elevated white blood cell count, unspecified: Secondary | ICD-10-CM | POA: Diagnosis present

## 2018-05-20 DIAGNOSIS — Z7952 Long term (current) use of systemic steroids: Secondary | ICD-10-CM

## 2018-05-20 DIAGNOSIS — J9621 Acute and chronic respiratory failure with hypoxia: Secondary | ICD-10-CM | POA: Diagnosis present

## 2018-05-20 DIAGNOSIS — C7949 Secondary malignant neoplasm of other parts of nervous system: Secondary | ICD-10-CM | POA: Diagnosis present

## 2018-05-20 DIAGNOSIS — Z79899 Other long term (current) drug therapy: Secondary | ICD-10-CM | POA: Diagnosis not present

## 2018-05-20 DIAGNOSIS — I10 Essential (primary) hypertension: Secondary | ICD-10-CM | POA: Diagnosis not present

## 2018-05-20 DIAGNOSIS — M1990 Unspecified osteoarthritis, unspecified site: Secondary | ICD-10-CM | POA: Diagnosis present

## 2018-05-20 DIAGNOSIS — Z85828 Personal history of other malignant neoplasm of skin: Secondary | ICD-10-CM

## 2018-05-20 DIAGNOSIS — Z882 Allergy status to sulfonamides status: Secondary | ICD-10-CM | POA: Diagnosis not present

## 2018-05-20 DIAGNOSIS — J441 Chronic obstructive pulmonary disease with (acute) exacerbation: Secondary | ICD-10-CM | POA: Diagnosis not present

## 2018-05-20 DIAGNOSIS — Z87442 Personal history of urinary calculi: Secondary | ICD-10-CM

## 2018-05-20 DIAGNOSIS — J9611 Chronic respiratory failure with hypoxia: Secondary | ICD-10-CM | POA: Diagnosis present

## 2018-05-20 DIAGNOSIS — T380X5A Adverse effect of glucocorticoids and synthetic analogues, initial encounter: Secondary | ICD-10-CM | POA: Diagnosis present

## 2018-05-20 DIAGNOSIS — R911 Solitary pulmonary nodule: Secondary | ICD-10-CM | POA: Diagnosis present

## 2018-05-20 DIAGNOSIS — Z9981 Dependence on supplemental oxygen: Secondary | ICD-10-CM

## 2018-05-20 DIAGNOSIS — Z9841 Cataract extraction status, right eye: Secondary | ICD-10-CM | POA: Diagnosis not present

## 2018-05-20 DIAGNOSIS — Z9842 Cataract extraction status, left eye: Secondary | ICD-10-CM | POA: Diagnosis not present

## 2018-05-20 DIAGNOSIS — N400 Enlarged prostate without lower urinary tract symptoms: Secondary | ICD-10-CM | POA: Diagnosis present

## 2018-05-20 DIAGNOSIS — K219 Gastro-esophageal reflux disease without esophagitis: Secondary | ICD-10-CM

## 2018-05-20 DIAGNOSIS — Z79890 Hormone replacement therapy (postmenopausal): Secondary | ICD-10-CM

## 2018-05-20 DIAGNOSIS — E785 Hyperlipidemia, unspecified: Secondary | ICD-10-CM | POA: Diagnosis present

## 2018-05-20 DIAGNOSIS — Z8249 Family history of ischemic heart disease and other diseases of the circulatory system: Secondary | ICD-10-CM

## 2018-05-20 DIAGNOSIS — E039 Hypothyroidism, unspecified: Secondary | ICD-10-CM | POA: Diagnosis present

## 2018-05-20 DIAGNOSIS — Z7951 Long term (current) use of inhaled steroids: Secondary | ICD-10-CM

## 2018-05-20 DIAGNOSIS — Z87891 Personal history of nicotine dependence: Secondary | ICD-10-CM | POA: Diagnosis not present

## 2018-05-20 DIAGNOSIS — R42 Dizziness and giddiness: Secondary | ICD-10-CM | POA: Diagnosis not present

## 2018-05-20 DIAGNOSIS — J9600 Acute respiratory failure, unspecified whether with hypoxia or hypercapnia: Secondary | ICD-10-CM

## 2018-05-20 DIAGNOSIS — C4442 Squamous cell carcinoma of skin of scalp and neck: Secondary | ICD-10-CM | POA: Diagnosis not present

## 2018-05-20 LAB — CBC WITH DIFFERENTIAL/PLATELET
Abs Immature Granulocytes: 0.02 10*3/uL (ref 0.00–0.07)
Basophils Absolute: 0 10*3/uL (ref 0.0–0.1)
Basophils Relative: 0 %
Eosinophils Absolute: 0.1 10*3/uL (ref 0.0–0.5)
Eosinophils Relative: 1 %
HCT: 44.8 % (ref 39.0–52.0)
Hemoglobin: 14.2 g/dL (ref 13.0–17.0)
Immature Granulocytes: 0 %
Lymphocytes Relative: 29 %
Lymphs Abs: 2.3 10*3/uL (ref 0.7–4.0)
MCH: 30.2 pg (ref 26.0–34.0)
MCHC: 31.7 g/dL (ref 30.0–36.0)
MCV: 95.3 fL (ref 80.0–100.0)
Monocytes Absolute: 0.9 10*3/uL (ref 0.1–1.0)
Monocytes Relative: 11 %
Neutro Abs: 4.8 10*3/uL (ref 1.7–7.7)
Neutrophils Relative %: 59 %
Platelets: 206 10*3/uL (ref 150–400)
RBC: 4.7 MIL/uL (ref 4.22–5.81)
RDW: 13.2 % (ref 11.5–15.5)
WBC: 8.2 10*3/uL (ref 4.0–10.5)
nRBC: 0 % (ref 0.0–0.2)

## 2018-05-20 LAB — I-STAT TROPONIN, ED: TROPONIN I, POC: 0.04 ng/mL (ref 0.00–0.08)

## 2018-05-20 LAB — INFLUENZA PANEL BY PCR (TYPE A & B)
INFLBPCR: NEGATIVE
Influenza A By PCR: NEGATIVE

## 2018-05-20 LAB — I-STAT ARTERIAL BLOOD GAS, ED
Bicarbonate: 25 mmol/L (ref 20.0–28.0)
O2 SAT: 97 %
PO2 ART: 94 mmHg (ref 83.0–108.0)
TCO2: 26 mmol/L (ref 22–32)
pCO2 arterial: 43.3 mmHg (ref 32.0–48.0)
pH, Arterial: 7.371 (ref 7.350–7.450)

## 2018-05-20 LAB — BASIC METABOLIC PANEL WITH GFR
Anion gap: 11 (ref 5–15)
BUN: 20 mg/dL (ref 8–23)
CO2: 26 mmol/L (ref 22–32)
Calcium: 8.7 mg/dL — ABNORMAL LOW (ref 8.9–10.3)
Chloride: 101 mmol/L (ref 98–111)
Creatinine, Ser: 1.46 mg/dL — ABNORMAL HIGH (ref 0.61–1.24)
GFR calc Af Amer: 49 mL/min — ABNORMAL LOW
GFR calc non Af Amer: 42 mL/min — ABNORMAL LOW
Glucose, Bld: 118 mg/dL — ABNORMAL HIGH (ref 70–99)
Potassium: 4.4 mmol/L (ref 3.5–5.1)
Sodium: 138 mmol/L (ref 135–145)

## 2018-05-20 LAB — BRAIN NATRIURETIC PEPTIDE: B NATRIURETIC PEPTIDE 5: 27 pg/mL (ref 0.0–100.0)

## 2018-05-20 LAB — MRSA PCR SCREENING: MRSA by PCR: NEGATIVE

## 2018-05-20 MED ORDER — LEVOTHYROXINE SODIUM 50 MCG PO TABS
50.0000 ug | ORAL_TABLET | Freq: Every evening | ORAL | Status: DC
  Administered 2018-05-20 – 2018-05-23 (×4): 50 ug via ORAL
  Filled 2018-05-20 (×4): qty 1

## 2018-05-20 MED ORDER — SODIUM CHLORIDE 0.9 % IV SOLN
1.0000 g | INTRAVENOUS | Status: DC
  Administered 2018-05-20 – 2018-05-23 (×4): 1 g via INTRAVENOUS
  Filled 2018-05-20 (×5): qty 10

## 2018-05-20 MED ORDER — ENOXAPARIN SODIUM 40 MG/0.4ML ~~LOC~~ SOLN
40.0000 mg | SUBCUTANEOUS | Status: DC
  Administered 2018-05-20 – 2018-05-23 (×4): 40 mg via SUBCUTANEOUS
  Filled 2018-05-20 (×5): qty 0.4

## 2018-05-20 MED ORDER — IPRATROPIUM-ALBUTEROL 0.5-2.5 (3) MG/3ML IN SOLN
3.0000 mL | RESPIRATORY_TRACT | Status: DC | PRN
  Administered 2018-05-20 – 2018-05-23 (×3): 3 mL via RESPIRATORY_TRACT
  Filled 2018-05-20 (×2): qty 3

## 2018-05-20 MED ORDER — ALBUTEROL (5 MG/ML) CONTINUOUS INHALATION SOLN
10.0000 mg/h | INHALATION_SOLUTION | Freq: Once | RESPIRATORY_TRACT | Status: AC
  Administered 2018-05-20: 10 mg/h via RESPIRATORY_TRACT
  Filled 2018-05-20: qty 20

## 2018-05-20 MED ORDER — MAGNESIUM SULFATE 2 GM/50ML IV SOLN
2.0000 g | Freq: Once | INTRAVENOUS | Status: AC
  Administered 2018-05-20: 2 g via INTRAVENOUS
  Filled 2018-05-20: qty 50

## 2018-05-20 MED ORDER — SODIUM CHLORIDE 0.9 % IV SOLN
INTRAVENOUS | Status: AC
  Administered 2018-05-20: 18:00:00 via INTRAVENOUS

## 2018-05-20 MED ORDER — SODIUM CHLORIDE 0.9 % IV SOLN
INTRAVENOUS | Status: DC
  Administered 2018-05-20: 12:00:00 via INTRAVENOUS

## 2018-05-20 MED ORDER — PREDNISONE 20 MG PO TABS
40.0000 mg | ORAL_TABLET | Freq: Every day | ORAL | Status: AC
  Administered 2018-05-21 – 2018-05-24 (×4): 40 mg via ORAL
  Filled 2018-05-20 (×5): qty 2

## 2018-05-20 MED ORDER — SIMVASTATIN 5 MG PO TABS
5.0000 mg | ORAL_TABLET | Freq: Every day | ORAL | Status: DC
  Administered 2018-05-20 – 2018-05-23 (×4): 5 mg via ORAL
  Filled 2018-05-20 (×7): qty 1

## 2018-05-20 MED ORDER — IPRATROPIUM BROMIDE 0.02 % IN SOLN
0.5000 mg | Freq: Once | RESPIRATORY_TRACT | Status: AC
  Administered 2018-05-20: 0.5 mg via RESPIRATORY_TRACT
  Filled 2018-05-20: qty 2.5

## 2018-05-20 NOTE — H&P (Signed)
Date: 05/20/2018               Patient Name:  [name] MRN: [id number]  DOB: [birth year] Age / Sex: 84 y.o., male   PCP: [name], MD         Medical Service: Internal Medicine Teaching Service         Attending Physician: Dr. Aldine Contes, MD    First Contact: Dr. Koleen Distance Pager: [phone]  Second Contact: Dr. Shan Levans Pager: [phone]       After Hours (After 5p/  First Contact Pager: [phone]  weekends / holidays): Second Contact Pager: [phone]   Chief Complaint: shortness of breath   History of Present Illness: Mr. Hovis is an 84 y/o gentleman with PMHx of COPD on 2L O2 at night, hypothyroidism, HTN who presents with progressively worsening shortness of breath for the last 2 days. Symptoms are worse with any type of exertion. Complains of associated cough with intermittent sputum production, lightheadedness with standing, and poor PO intake. He has been using his Albuterol nebulizer at home without relief. He is high functioning at baseline, but has not been able to perform his usual activities at home due to dyspnea. His wife is currently recovering from pneumonia. He has never required hospitalization for COPD. Denies syncope, fevers, chills, chest pain, n/v, urinary symptoms, changes in bowel movements.   Meds:  Current Meds  Medication Sig  . albuterol (PROVENTIL HFA;VENTOLIN HFA) 108 (90 BASE) MCG/ACT inhaler Inhale 2 puffs into the lungs every 4 (four) hours as needed for wheezing or shortness of breath.  Marland Kitchen albuterol (PROVENTIL) (2.5 MG/3ML) 0.083% nebulizer solution USE 1 VIAL IN NEBULIZER EVERY 6 HOURS. Generic: VENTOLIN (Patient taking differently: Take 2.5 mg by nebulization every 6 (six) hours as needed. )  . levothyroxine (SYNTHROID, LEVOTHROID) 50 MCG tablet Take 50 mcg by mouth every evening.   . mometasone (ASMANEX) 220 MCG/INH inhaler Inhale 2 puffs into the lungs daily.  . predniSONE (DELTASONE) 5 MG tablet TAKE 1 TABLET (5 MG TOTAL) BY  MOUTH DAILY WITH BREAKFAST. (Patient taking differently: Take 5 mg by mouth every other day. )  . simvastatin (ZOCOR) 5 MG tablet Take 5 mg by mouth at bedtime.    . triamcinolone cream (KENALOG) 0.1 % Apply 1 application topically 2 (two) times daily.      Allergies: Allergies as of 05/20/2018 - Review Complete 05/20/2018  Allergen Reaction Noted  . Sulfa antibiotics  11/11/2014  . Sulfonamide derivatives     Past Medical History:  Diagnosis Date  . Arthritis    osteoarthritis  . Cancer ([place])    skin cancer only.-no melanoma.  Marland Kitchen COPD (chronic obstructive pulmonary disease) ([place])   . History of kidney stones    lithotripsy x1   . History of oxygen administration    Oxygen @ 2 l/m nasally bedtime  . Hyperlipidemia   . Hypothyroidism   . Pulmonary nodule, right   . 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 Tobacco abuse    Quit 09/17/10    Family History:  - mother died of MI in mid-15s - Father died of unknown cancer in his 58s  Social History: lives at home with this his wife; 51 pack year smoking history (quite 10 years ago); no EtOH or illicit drug use  Review of Systems: A complete ROS was negative except as per HPI.   Physical Exam: Blood pressure 107/65, pulse 94, temperature 97.9 F (36.6 C),  temperature source Oral, resp. rate (!) 25, SpO2 97 %. General: awake, alert, sitting up in bed in NAD HEENT: [place]/AT; conjunctiva clear; oropharynx without erythema or exudates Neck: Supple; no thyromegaly CV: tachycardic; regular rhythm; no murmurs, rubs or gallops Pulm: increased work of breathing with accessory muscle use; speaking in full sentences; decreased air movement throughout with bibasilar rales; expiratory wheezes in anterior fields  Abd: BS+; abdomen is soft, non-tender, non-distended Ext: no edema  Neuro: A&Ox3; follows commands; no focal deficits  EKG: personally reviewed my interpretation is sinus tachycardia without ST segment changes   CXR:  personally reviewed my interpretation is hyperinflation; no effusion or focal infiltrates  Assessment & Plan by Problem: Active Problems:   COPD with acute exacerbation [facility])  Mr. Okey is an 84 y/o gentleman with PMHx of COPD on 2L O2 at night, hypothyroidism, HTN who presents with progressively worsening shortness of breath for the last 2 days. He received duonebs, mag sulfate in the ED. Requiring 3L nasal cannula to maintain O2 saturation. He was subsequently admitted for COPD exacerbation.   1. COPD exacerbation:  - despite normal white count and afebrile, concern for respiratory infection given wife's history of pneumonia. Patient does not have strong cough to bring up secretions. Will start on Ceftriaxone for empiric coverage - duonebs q 4 - Prednisone 40 mg x5 days  - given his increased work of breathing, will order BiPAP as needed   2. Hypothyroid: continue home synthroid 50 mcg  3. HLD: continue home Zocor 5 mg   DIET: Regular DVT ppx: Lovenox Code: FULL   Dispo: Admit patient to Inpatient with expected length of stay greater than 2 midnights.  SignedDelice Bison, DO 05/20/2018, 4:20 PM  Pager: [phone]

## 2018-05-20 NOTE — ED Triage Notes (Addendum)
Pt arrived [facility]; pt from [facility] with c/o difficulty breathing; states its been going on 2 days; wife just got over PNA; pt has hx of COPD; pt rec'd albuterol tx and duoneb; pt rec'd 125mg  solumedrol; 103 HR, 40 R, 133/72 BP, CBG no taken

## 2018-05-20 NOTE — ED Provider Notes (Signed)
[facility] EMERGENCY DEPARTMENT Provider Note   CSN: [id number] Arrival date & time: 05/20/18  1052   History   Chief Complaint Chief Complaint  Patient presents with  . Shortness of Breath    HPI [name] is a 84 y.o. male with history of COPD, remote tobacco use, BPH, hyperlipidemia, multinodular goiter on synthroid, on nighttime supplemental oxygen as needed is brought to the ER for shortness of breath.  Patient reports shortness of breath is exertional, slightly better when he is at rest onset 2 days ago.  Associated with worsening cough and more sputum production, lightheadedness and feeling like he is going to pass out when he stands up.  He has been compliant with his prednisone every other day and breathing treatments at home.  States his wife has had a cough.  Per EMS pt feels warm but no oral temperature. Patient received 125 Solu-Medrol and DuoNeb prior to arrival.  Pt does not feel any better after tx. Patient denies fevers, chills, chest pain on exertion, nausea, vomiting, abdominal pain, extremity swelling.  No alleviating factors.  HPI  Past Medical History:  Diagnosis Date  . Arthritis    osteoarthritis  . Cancer ([place])    skin cancer only.-no melanoma.  Marland Kitchen COPD (chronic obstructive pulmonary disease) ([place])   . History of kidney stones    lithotripsy x1   . History of oxygen administration    Oxygen @ 2 l/m nasally bedtime  . Hyperlipidemia   . Hypothyroidism   . Pulmonary nodule, right   . 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 Tobacco abuse    Quit 09/17/10    Patient Active Problem List   Diagnosis Date Noted  . 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 Overweight (BMI 25.0-29.9) 04/30/2015  . S/P left hip conversion to [facility] 04/28/2015  . Fracture of femoral neck, left ([place]) 06/07/2014  . Hypothyroidism 06/04/2014  . Closed left hip fracture ([place]) 06/03/2014    . COPD GOLD IV 06/03/2014  . Hyperlipidemia 06/03/2014  . Hip fracture ([place]) 06/03/2014  . Chronic respiratory failure with hypoxia ([place]) 10/27/2013  . COPD with emphysema ([place]) 07/03/2007    Past Surgical History:  Procedure Laterality Date  . CATARACT EXTRACTION, BILATERAL    . CONVERSION TO TOTAL HIP Left 04/28/2015   Procedure: LEFT HIP CONVERSION PREVIOUS HIP SURGERY TO TOTAL HIP ARTHROPLASTY (ANTERIOR);  Surgeon: Paralee Cancel, MD;  Location: [facility];  Service: Orthopedics;  Laterality: Left;  . electronavigational bronchoscopy  07/2010  . HIP PINNING,CANNULATED Left 06/04/2014   Procedure: LEFT HIP REDUCTION WITH PERCUTANEOUS SCREWS;  Surgeon: Mauri Pole, MD;  Location: [facility];  Service: Orthopedics;  Laterality: Left;  . TRACHEOSTOMY     after aspiration event as a 84 y/o        Home Medications    Prior to Admission medications   Medication Sig Start Date End Date Taking? Authorizing Provider  albuterol (PROVENTIL HFA;VENTOLIN HFA) 108 (90 BASE) MCG/ACT inhaler Inhale 2 puffs into the lungs every 4 (four) hours as needed for wheezing or shortness of breath.   Yes [provider]  albuterol (PROVENTIL) (2.5 MG/3ML) 0.083% nebulizer solution USE 1 VIAL IN NEBULIZER EVERY 6 HOURS. Generic: VENTOLIN Patient taking differently: Take 2.5 mg by nebulization every 6 (six) hours as needed.  07/21/17  Yes [name], MD  levothyroxine (SYNTHROID, LEVOTHROID) 50 MCG tablet Take 50 mcg by  mouth every evening.    Yes [provider]  mometasone (ASMANEX) 220 MCG/INH inhaler Inhale 2 puffs into the lungs daily.   Yes [provider]  predniSONE (DELTASONE) 5 MG tablet TAKE 1 TABLET (5 MG TOTAL) BY MOUTH DAILY WITH BREAKFAST. Patient taking differently: Take 5 mg by mouth every other day.  02/13/18  Yes [name], MD  simvastatin (ZOCOR) 5 MG tablet Take 5 mg by mouth at bedtime.     Yes [provider]  triamcinolone cream (KENALOG) 0.1 % Apply 1  application topically 2 (two) times daily.  08/24/17  Yes [provider]  guaiFENesin (MUCINEX) 600 MG 12 hr tablet Take 2 tablets (1,200 mg total) by mouth 2 (two) times daily as needed. Patient not taking: Reported on 10/03/2017 07/29/16   [name], MD  HYDROcodone-acetaminophen (NORCO/VICODIN) 5-325 MG tablet Take 1-2 tablets by mouth every 6 (six) hours as needed. Patient not taking: Reported on 03/27/2018 02/04/18   Volanda Napoleon, PA-C    Family History Family History  Problem Relation Age of Onset  . Cancer Neg Hx     Social History Social History   Tobacco Use  . Smoking status: Former Smoker    Packs/day: 1.00    Years: 55.00    Pack years: 55.00    Types: Cigarettes    Last attempt to quit: 04/22/2011    Years since quitting: 7.0  . Smokeless tobacco: Never Used  . Tobacco comment: occasionally smoked 2-3 times a week--1 cig each time  Substance Use Topics  . Alcohol use: Yes    Comment: rare  social  . Drug use: No     Allergies   Sulfa antibiotics and Sulfonamide derivatives   Review of Systems Review of Systems  Respiratory: Positive for cough and shortness of breath.   Allergic/Immunologic: Positive for immunocompromised state (on daily prednisone).  Neurological: Positive for light-headedness.  All other systems reviewed and are negative.    Physical Exam Updated Vital Signs BP 133/65   Pulse 100   Temp 97.9 F (36.6 C) (Oral)   Resp 19   SpO2 100%   Physical Exam  Constitutional: He is oriented to person, place, and time. He appears well-developed and well-nourished. No distress.  Alert, in moderate respiratory distress.  Mentating well.  HENT:  Head: Normocephalic and atraumatic.  Right Ear: External ear normal.  Left Ear: External ear normal.  Nose: Nose normal.  Dry lips, moist mucous membranes.  Eyes: Conjunctivae and EOM are normal. No scleral icterus.  Neck: Normal range of motion. Neck supple.  Cardiovascular: Normal  rate, regular rhythm, normal heart sounds and intact distal pulses.  No murmur heard. 1+ DP and radial pulses bilaterally.  No lower extremity edema or calf tenderness.  Pulmonary/Chest: Accessory muscle usage present. He is in respiratory distress. He has decreased breath sounds in the right lower field and the left lower field. He has rhonchi in the left middle field and the left lower field.  Moderate respiratory distress.  He is speaking in 2-3 word sentences.  Pursed lips.  Some belly breathing.  Tachypneic.  He is currently on a DuoNeb treatment.  Abdominal: Soft. There is no tenderness.  Musculoskeletal: Normal range of motion. He exhibits no deformity.  Neurological: He is alert and oriented to person, place, and time.  Skin: Skin is warm and dry. Capillary refill takes less than 2 seconds.  Psychiatric: He has a normal mood and affect. His behavior is normal. Judgment and  thought content normal.  Nursing note and vitals reviewed.    ED Treatments / Results  Labs (all labs ordered are listed, but only abnormal results are displayed) Labs Reviewed  BASIC METABOLIC PANEL - Abnormal; Notable for the following components:      Result Value   Glucose, Bld 118 (*)    Creatinine, Ser 1.46 (*)    Calcium 8.7 (*)    GFR calc non Af Amer 42 (*)    GFR calc Af Amer 49 (*)    All other components within normal limits  CBC WITH DIFFERENTIAL/PLATELET  BRAIN NATRIURETIC PEPTIDE  INFLUENZA PANEL BY PCR (TYPE A & B)  I-STAT TROPONIN, ED  I-STAT ARTERIAL BLOOD GAS, ED    EKG EKG Interpretation  Date/Time:  Saturday May 20 2018 11:05:12 EST Ventricular Rate:  102 PR Interval:    QRS Duration: 88 QT Interval:  338 QTC Calculation: 441 R Axis:   32 Text Interpretation:  Sinus tachycardia Ventricular premature complex Right atrial enlargement Confirmed by Pattricia Boss [phone]) on 05/20/2018 11:10:16 AM Also confirmed by Pattricia Boss [phone]), editor Philomena Doheny [phone])  on  05/20/2018 12:27:10 PM   Radiology Dg Chest Port 1 View  Result Date: 05/20/2018 CLINICAL DATA:  Productive cough and short of breath for 2 days EXAM: PORTABLE CHEST 1 VIEW COMPARISON:  04/07/2015 FINDINGS: Lungs are hyperaerated and clear. Normal heart size. No pneumothorax. No pleural effusion. Linear markings along the left and right heart borders are felt to represent overlying vessels rather than an indication of mediastinal emphysema. IMPRESSION: No active disease. Electronically Signed   By: Marybelle Killings M.D.   On: 05/20/2018 11:33    Procedures .Critical Care Performed by: Kinnie Feil, PA-C Authorized by: Kinnie Feil, PA-C   Critical care provider statement:    Critical care time (minutes):  45   Critical care was necessary to treat or prevent imminent or life-threatening deterioration of the following conditions:  Respiratory failure   Critical care was time spent personally by me on the following activities:  Discussions with consultants, evaluation of patient's response to treatment, examination of patient, ordering and performing treatments and interventions, ordering and review of laboratory studies, ordering and review of radiographic studies, pulse oximetry, re-evaluation of patient's condition, obtaining history from patient or surrogate and review of old charts   I assumed direction of critical care for this patient from another provider in my specialty: no     (including critical care time)  Medications Ordered in ED Medications  0.9 %  sodium chloride infusion ( Intravenous Stopped 05/20/18 1241)  albuterol (PROVENTIL,VENTOLIN) solution continuous neb (10 mg/hr Nebulization Given 05/20/18 1149)  ipratropium (ATROVENT) nebulizer solution 0.5 mg (0.5 mg Nebulization Given 05/20/18 1149)  magnesium sulfate IVPB 2 g 50 mL (0 g Intravenous Stopped 05/20/18 1239)     Initial Impression / Assessment and Plan / ED Course  I have reviewed the triage vital  signs and the nursing notes.  Pertinent labs & imaging results that were available during my care of the patient were reviewed by me and considered in my medical decision making (see chart for details).  Clinical Course as of May 21 1303  Sat May 20, 2018  1153 Creatinine(!): 1.46 [CG]    Clinical Course User Index [CG] Kinnie Feil, PA-C    Highest on differential is COPD exacerbation given known sick contact, worsening cough and sputum.  Also considering new onset heart failure although he does does not have significant  pedal edema.  Cardiac ischemia is also a possibility although he has no chest pain.  We will obtain screening labs, BNP, troponin, influenza swab, ABG with chest x-ray and EKG.  He has received Solu-Medrol and DuoNeb prior to arrival without significant improvement in work of breathing.  We will initiate continuous albuterol nebulizing treatment, Atrovent, magnesium.  He has never required admission for COPD flare or intubations in the past.  Will reassess closely.   1250: Work-up consistent with acute COPD exacerbation, refractory multiple tx leading to acute respiratory failure requiring 3 L .  CXR negative.  Negative influenza.  WBC normal.  ABG normal.  BNP normal.  He continues to have increased WOB. Will consult admitting team for admission.   Final Clinical Impressions(s) / ED Diagnoses   Spoke to IM admitting MD who has accepted patient.  Final diagnoses:  COPD exacerbation ([place])  Acute respiratory failure without hypercapnia [facility])    ED Discharge Orders    None       Kinnie Feil, PA-C 05/20/18 1304    Pattricia Boss, MD 05/21/18 1529

## 2018-05-20 NOTE — ED Notes (Signed)
Attempted report to floor, per unit secretary the patient has not yet been assigned.

## 2018-05-21 DIAGNOSIS — Z79899 Other long term (current) drug therapy: Secondary | ICD-10-CM

## 2018-05-21 DIAGNOSIS — J441 Chronic obstructive pulmonary disease with (acute) exacerbation: Principal | ICD-10-CM

## 2018-05-21 DIAGNOSIS — E039 Hypothyroidism, unspecified: Secondary | ICD-10-CM

## 2018-05-21 DIAGNOSIS — Z79890 Hormone replacement therapy (postmenopausal): Secondary | ICD-10-CM

## 2018-05-21 DIAGNOSIS — Z9981 Dependence on supplemental oxygen: Secondary | ICD-10-CM

## 2018-05-21 DIAGNOSIS — I10 Essential (primary) hypertension: Secondary | ICD-10-CM

## 2018-05-21 LAB — CBC
HCT: 36.7 % — ABNORMAL LOW (ref 39.0–52.0)
Hemoglobin: 12 g/dL — ABNORMAL LOW (ref 13.0–17.0)
MCH: 30.1 pg (ref 26.0–34.0)
MCHC: 32.7 g/dL (ref 30.0–36.0)
MCV: 92 fL (ref 80.0–100.0)
Platelets: 207 10*3/uL (ref 150–400)
RBC: 3.99 MIL/uL — ABNORMAL LOW (ref 4.22–5.81)
RDW: 13 % (ref 11.5–15.5)
WBC: 11.8 10*3/uL — ABNORMAL HIGH (ref 4.0–10.5)
nRBC: 0 % (ref 0.0–0.2)

## 2018-05-21 LAB — COMPREHENSIVE METABOLIC PANEL
ALT: 13 U/L (ref 0–44)
AST: 23 U/L (ref 15–41)
Albumin: 2.9 g/dL — ABNORMAL LOW (ref 3.5–5.0)
Alkaline Phosphatase: 62 U/L (ref 38–126)
Anion gap: 11 (ref 5–15)
BILIRUBIN TOTAL: 0.6 mg/dL (ref 0.3–1.2)
BUN: 24 mg/dL — ABNORMAL HIGH (ref 8–23)
CO2: 22 mmol/L (ref 22–32)
Calcium: 8.1 mg/dL — ABNORMAL LOW (ref 8.9–10.3)
Chloride: 103 mmol/L (ref 98–111)
Creatinine, Ser: 1.27 mg/dL — ABNORMAL HIGH (ref 0.61–1.24)
GFR calc Af Amer: 58 mL/min — ABNORMAL LOW (ref >60)
GFR calc non Af Amer: 50 mL/min — ABNORMAL LOW (ref >60)
Glucose, Bld: 156 mg/dL — ABNORMAL HIGH (ref 70–99)
Potassium: 4.4 mmol/L (ref 3.5–5.1)
Sodium: 136 mmol/L (ref 135–145)
Total Protein: 5.8 g/dL — ABNORMAL LOW (ref 6.5–8.1)

## 2018-05-21 MED ORDER — GUAIFENESIN-DM 100-10 MG/5ML PO SYRP
5.0000 mL | ORAL_SOLUTION | ORAL | Status: DC | PRN
  Administered 2018-05-21 – 2018-05-23 (×2): 5 mL via ORAL
  Filled 2018-05-21 (×2): qty 5

## 2018-05-21 MED ORDER — LIP MEDEX EX OINT
TOPICAL_OINTMENT | CUTANEOUS | Status: DC | PRN
  Filled 2018-05-21: qty 7

## 2018-05-21 MED ORDER — WHITE PETROLATUM EX OINT
TOPICAL_OINTMENT | Freq: Two times a day (BID) | CUTANEOUS | Status: DC
  Administered 2018-05-21: 1 via TOPICAL
  Administered 2018-05-23: 0.2 via TOPICAL
  Administered 2018-05-23: 21:00:00 via TOPICAL
  Administered 2018-05-24: 0.2 via TOPICAL
  Filled 2018-05-21 (×2): qty 28.35

## 2018-05-21 MED ORDER — RAMELTEON 8 MG PO TABS
8.0000 mg | ORAL_TABLET | Freq: Every day | ORAL | Status: DC
  Administered 2018-05-21 – 2018-05-23 (×4): 8 mg via ORAL
  Filled 2018-05-21 (×6): qty 1

## 2018-05-21 MED ORDER — IPRATROPIUM-ALBUTEROL 0.5-2.5 (3) MG/3ML IN SOLN
3.0000 mL | Freq: Four times a day (QID) | RESPIRATORY_TRACT | Status: DC
  Administered 2018-05-21 (×3): 3 mL via RESPIRATORY_TRACT
  Filled 2018-05-21 (×3): qty 3

## 2018-05-21 NOTE — Progress Notes (Signed)
Pt stated having trouble breathing. Upon assessment pt had tachypnea, fine crackles and expiratory wheezes bilaterally, 100% sat on 3L/[place]. Only had 150 ml output. Gave PRN neb, called respiratory for further assessment. Will continue to monitor patient closely.

## 2018-05-21 NOTE — Progress Notes (Signed)
  Date: 05/21/2018  Patient name: [name]  Medical record number: [id number]  Date of birth: 09-29-1927   I have seen and evaluated [name] and discussed their care with the Residency Team.  In brief, patient is an 84 year old male with a past medical history of COPD on home oxygen, hypothyroidism, hypertension who presented to the ED with progressively worsening shortness of breath over the last 2 days.  Patient states that approximately 2 days ago he developed sudden onset of shortness of breath which is worse with exertion and associated with a productive cough with whitish phlegm.  Patient also complained of decreased oral intake and poor appetite.  Patient did use his albuterol nebulizer at home but did not have any relief from this.  No chest pain, no palpitations, no lightheadedness, no syncope, no focal weakness, no tingling or numbness, no headache, no blurry vision, no nausea or vomiting, no diarrhea.  Today patient states that her shortness of breath has improved but is not at baseline.  He denies any fevers or chills.  PMHx, Fam Hx, and/or Soc Hx : As per resident admit note  Vitals:   05/21/18 0752 05/21/18 0825  BP: (!) 151/67   Pulse: 87   Resp: (!) 30   Temp: (!) 97.5 F (36.4 C)   SpO2: 98% 99%   General: Awake, alert, oriented x3, NAD CVS: Regular rate and rhythm, normal heart sounds Lungs: Scattered expiratory wheeze noted on exam Abdomen: Soft, nontender, nondistended, normoactive bowel sounds Extremity: No edema noted  Assessment and Plan: I have seen and evaluated the patient as outlined above. I agree with the formulated Assessment and Plan as detailed in the residents' note, with the following changes:   1.  Acute COPD exacerbation: -Patient presented to the ED with progressively worsening shortness of breath associate with productive cough in the setting of a history of COPD.  Chest x-ray showed no infiltrate.  Patient has remained afebrile  here.  He did have some mild leukocytosis today which I suspect is secondary to steroids administered for COPD. -We will continue with prednisone 40 mg to complete a 5-day course -Continue with nebs every 4 hours as needed -Would consider stopping his ceftriaxone and transitioning him to oral azithromycin to complete a 5-day course -Continue with O2 via nasal cannula as needed -No further work-up at this time -If patient continues to improve he should be stable for discharge home tomorrow  Aldine Contes, MD 12/1/201910:19 AM

## 2018-05-21 NOTE — Consult Note (Signed)
[facility] Nurse wound consult note Reason for Consult:Chronic, nonhealing full thickness area where patient has a previous neoplasm and radiation therapy treatments. Instructed by oncologist and dermatologist how to care for the area on a twice daily basis.  I will implement those orders here, providing staff with guidance via the Orders. Wound type: Neoplastic Pressure Injury POA: N/A Measurement: 2.5cm x 2cm with depth obscured by the presence of dried serum (scab) Wound bed: dry, dried serum Drainage (amount, consistency, odor) None Periwound: Intact Dressing procedure/placement/frequency: Patient is not admitted for this reason, but has been self-caring for the area since his previous neoplasm was removed and he underwent radiation therapy to the area.  He has been instructed to wash with soap and water twice daily, pat dry and apply a layer of Vaseline (white petrolatum) ointment to the area and to leave open to air.  He only covers the area when he leaves the house and that is to both "discourage onlookers" and to "keep the world's germs out of it".  I have asked the Nursing staff to perform this care while he is in house and to place a bandage over the area upon discharge.  [facility] nursing team will not follow, but will remain available to this patient, the nursing and medical teams.  Please re-consult if needed. Thanks, Maudie Flakes, MSN, RN, [place], [facility]  Pager# [phone]

## 2018-05-21 NOTE — Progress Notes (Signed)
   Subjective:   Overnight: No acute events reported  Today [name] was examined at bedside lying comfortably in bed.  He states he is feeling better since admission and endorses improvement with his shortness of breath as well as a good appetite.  He currently denies fevers, chills, nausea, vomiting or any swelling of his lower extremities.  He is tolerating the nasal cannula.  All his questions and concerns were addressed.  Objective:  Vital signs in last 24 hours: Vitals:   05/21/18 0446 05/21/18 0500 05/21/18 0752 05/21/18 0825  BP: 134/72  (!) 151/67   Pulse: 85 85 87   Resp: (!) 22 (!) 30 (!) 30   Temp: 97.8 F (36.6 C)  (!) 97.5 F (36.4 C)   TempSrc: Axillary  Axillary   SpO2: 99% 96% 98% 99%  Weight: 76.7 kg      Constitutional: In no acute distress, speaking full sentences, lying comfortably in bed Cardiovascular: Regular rate and rhythm.  No murmurs, gallops, rubs Respiratory: Moderate expiratory wheezing anteriorly and posteriorly Abdomen: Soft, nontender, nondistended, nontender to palpation Extremity: No lower extremity edema  Assessment/Plan:  Active Problems:   COPD with acute exacerbation [facility])  [name] is an 84 year old pleasant gentleman with medical history significant of COPD on 2 L nasal cannula, hypothyroidism and hypertension here for management of acute exacerbation of COPD.  Acute exacerbation of COPD: He presented with worsening shortness of breath in conjunction with increased green sputum production.  Since admission, he has had clinical improvement with antibiotics, prednisone and breathing treatment. CXR was unreavealing for consolidation however did show changes consistent with COPD. Initially started on ceftriaxone for empiric coverage for community-acquired pneumonia given recent sick contacts. - Continue prednisone 40 mg (Day 2/5) - Continue Ceftriaxone 1gm q 24 hrs - Continue DuoNeb every 4 prn SOB  Hypothyroidism: Continue  Synthroid 50 mcg  Hyperlipidemia: Continue Zocor 5 mg  FEN: Replace electrolytes as needed, regular diet VTE prophylaxis: Subcutaneous Lovenox CODE STATUS: Full code  Dispo: Anticipated discharge in approximately 1-2 day(s).   [name], MD 05/21/2018, 12:16 PM Pager: [phone] IMTS PGY-1

## 2018-05-21 NOTE — Progress Notes (Signed)
RT note-Productive sough with thick yellow green sputum

## 2018-05-22 DIAGNOSIS — C4442 Squamous cell carcinoma of skin of scalp and neck: Secondary | ICD-10-CM

## 2018-05-22 DIAGNOSIS — R42 Dizziness and giddiness: Secondary | ICD-10-CM

## 2018-05-22 DIAGNOSIS — E785 Hyperlipidemia, unspecified: Secondary | ICD-10-CM

## 2018-05-22 DIAGNOSIS — K219 Gastro-esophageal reflux disease without esophagitis: Secondary | ICD-10-CM

## 2018-05-22 LAB — CBC
HCT: 36.6 % — ABNORMAL LOW (ref 39.0–52.0)
Hemoglobin: 11.7 g/dL — ABNORMAL LOW (ref 13.0–17.0)
MCH: 29.5 pg (ref 26.0–34.0)
MCHC: 32 g/dL (ref 30.0–36.0)
MCV: 92.4 fL (ref 80.0–100.0)
Platelets: 216 10*3/uL (ref 150–400)
RBC: 3.96 MIL/uL — ABNORMAL LOW (ref 4.22–5.81)
RDW: 13.2 % (ref 11.5–15.5)
WBC: 14.3 10*3/uL — ABNORMAL HIGH (ref 4.0–10.5)
nRBC: 0 % (ref 0.0–0.2)

## 2018-05-22 LAB — BASIC METABOLIC PANEL
Anion gap: 5 (ref 5–15)
BUN: 25 mg/dL — ABNORMAL HIGH (ref 8–23)
CO2: 28 mmol/L (ref 22–32)
Calcium: 8.1 mg/dL — ABNORMAL LOW (ref 8.9–10.3)
Chloride: 103 mmol/L (ref 98–111)
Creatinine, Ser: 1.19 mg/dL (ref 0.61–1.24)
GFR calc Af Amer: >60 mL/min (ref >60)
GFR calc non Af Amer: 54 mL/min — ABNORMAL LOW (ref >60)
GLUCOSE: 132 mg/dL — AB (ref 70–99)
Potassium: 4.4 mmol/L (ref 3.5–5.1)
Sodium: 136 mmol/L (ref 135–145)

## 2018-05-22 MED ORDER — IPRATROPIUM-ALBUTEROL 0.5-2.5 (3) MG/3ML IN SOLN
3.0000 mL | Freq: Four times a day (QID) | RESPIRATORY_TRACT | Status: DC
  Administered 2018-05-22: 3 mL via RESPIRATORY_TRACT
  Filled 2018-05-22: qty 3

## 2018-05-22 MED ORDER — CALCIUM CARBONATE ANTACID 500 MG PO CHEW
2.0000 | CHEWABLE_TABLET | Freq: Two times a day (BID) | ORAL | Status: DC | PRN
  Administered 2018-05-23: 400 mg via ORAL
  Filled 2018-05-22: qty 2

## 2018-05-22 MED ORDER — CALCIUM CARBONATE ANTACID 500 MG PO CHEW
2.0000 | CHEWABLE_TABLET | Freq: Once | ORAL | Status: AC
  Administered 2018-05-22: 400 mg via ORAL
  Filled 2018-05-22: qty 2

## 2018-05-22 MED ORDER — IPRATROPIUM-ALBUTEROL 0.5-2.5 (3) MG/3ML IN SOLN
3.0000 mL | Freq: Three times a day (TID) | RESPIRATORY_TRACT | Status: DC
  Administered 2018-05-22 – 2018-05-24 (×5): 3 mL via RESPIRATORY_TRACT
  Filled 2018-05-22 (×6): qty 3

## 2018-05-22 NOTE — Progress Notes (Signed)
   Subjective:   Overnight: No acute events reported.  Today [name] was examined at bedside and reports that he felt dizzy and lightheaded this morning when he tried to ambulate with the nurse.  He is states this episode happened right after he stood up.  He endorses sensation of palpitation however denies syncope or loss of consciousness.  In regards to COPD he reports decreased sputum production and stable shortness of breath.  He also states that he experienced acid reflux this morning.  Reports this is chronic and typically improves with Tums.   Objective:  Vital signs in last 24 hours: Vitals:   05/22/18 0602 05/22/18 0814 05/22/18 0843 05/22/18 1124  BP: (!) 153/68  (!) 152/74 140/66  Pulse:   77 74  Resp: 20  (!) 32 (!) 35  Temp: 97.6 F (36.4 C)  (!) 97.5 F (36.4 C) (!) 97.5 F (36.4 C)  TempSrc: Oral  Oral Oral  SpO2:  98% (!) 88% 98%  Weight: 76.7 kg      Constitutional: In no apparent distress, comfortably lying in bed HEENT: Nasal cannula in place Respiratory: Clear to auscultation bilaterally though he has poor airflow, no wheezes, crackles, rhonchi.  Using accessory muscles to breathe Cardiovascular: Tachycardia noted each time patient begins to speak otherwise regular rhythm.  No murmurs, gallops, rubs Abdomen: Soft, nondistended, nontender to palpation Skin: 2 x 2 centimeter scaly wound on the left scalp  Assessment/Plan:  Active Problems:   COPD with acute exacerbation [facility])  [name] is an 84 year old pleasant gentleman with medical history significant of COPD on 2 L nasal cannula, hypothyroidism and hypertension here for management of acute exacerbation of COPD.  Acute exacerbation of COPD: He indicates that his shortness of breath is stable and has had decreased sputum production.  On physical exams today he was noted to have decreased airflow as well as using accessory muscles to breathe.  CBC noticeable for leukocytosis though this is most likely  due to steroid use. - Continue prednisone 40 mg (day 3/5) - Continue ceftriaxone 1 g every 24 hours - Continue supplemental oxygen - Continue DuoNeb every 4 hours as needed shortness of breath - Once stable an ambulatory pulse ox will be obtained.  Suspecting orthostatic hypotension: Patient reported of lightheadedness and dizziness this a.m. during rounds when he began to ambulate with nurse.  He indicates that this episode happened right after he stood up.  Also reported of palpitation during this episode and was noted to be tachycardic especially with speech.  On review of telemetry monitor several runs of PVCs but remained in normal sinus rhythm. -Follow-up orthostatic vitals and rehydrate as needed. 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   Hypothyroidism: Continue Synthroid 50 mcg  Hyperlipidemia: Continue Zocor 5 mg  FEN: Replace electrolytes as needed, regular diet VTE prophylaxis: Subcutaneous Lovenox CODE STATUS: Full code  Dispo: Anticipated discharge in approximately 1-2 day(s).    Sean Rosenthal, MD 05/22/2018, 11:27 AM Pager: [phone] IMTS PGY-1

## 2018-05-22 NOTE — Progress Notes (Addendum)
Internal Medicine Attending:   I saw and examined the patient. I reviewed the resident's note and I agree with the resident's findings and plan as documented in the resident's note.  Active Problems:   Chronic respiratory failure with hypoxia ([facility])   COPD with acute exacerbation ([place])  84 year old man hospital day #2 with acute exacerbation of advanced COPD.  Patient has not made very much progress, he is tachypneic with respiratory rate around 30, using some accessory muscles, has little air movement bilaterally, small coarse wheezing and episode of significant lightheadedness upon standing from the bed today, heart rates around 110 and variable.  Overall I think he is struggling to recover from this COPD exacerbation due to his advanced disease, age and fragility.  Plan is to continue with prednisone, ceftriaxone, and inhaled bronchodilators 4 hours while awake.  Orthostatic vital signs are reassuring.  He lives independently with his wife near [facility], will consult PT and OT.  Plan will be for discharge to home when his respiratory status improves.  Lalla Brothers, MD

## 2018-05-22 NOTE — Evaluation (Signed)
Physical Therapy Evaluation Patient Details Name: [name] MRN: [id number] DOB: [birth year] Today's Date: 05/22/2018   History of Present Illness  [name] an 84 year old pleasant gentleman with medical history significant of COPD on 2 L nasal cannula, hypothyroidism and hypertension here for management of acute exacerbation of COPD.  Also had syncope on 12/2 when trying to get up.   Clinical Impression  Pt admitted with above diagnosis. Pt currently with functional limitations due to the deficits listed below (see PT Problem List). Pt was able to tolerate standing for BP but after the last orthostatic BP taken, pt needed to sit and sat abruptly on bed.  States he was fatigued but not dizzy.  Pt DOE 3/4 when standing with sats 94%.  DOE at rest 1/4.  Will follow acutely.  Pt will benefit from skilled PT to increase their independence and safety with mobility to allow discharge to the venue listed below.      Follow Up Recommendations Home health PT;Supervision/Assistance - 24 hour    Equipment Recommendations  None recommended by PT    Recommendations for Other Services       Precautions / Restrictions Precautions Precautions: Fall Restrictions Weight Bearing Restrictions: No      Mobility  Bed Mobility Overal bed mobility: Independent                Transfers Overall transfer level: Needs assistance Equipment used: Rolling walker (2 wheeled) Transfers: Sit to/from Stand Sit to Stand: Min assist         General transfer comment: Needed cues for hand placement and slight assist to power up. Steadying assist to stand for blood pressures.  Once blood pressures were taken, pt sat abruptly without warning. Pt c/o fatigue as cause of sitting abruptly.  Orthostatic BPs taken and in flowsheet.  BPs fluctuating and actually somewhat elevated.  Also, pt DOE 3/4 with activity.    Ambulation/Gait             General Gait Details: unable today  Stairs             Wheelchair Mobility    Modified Rankin (Stroke Patients Only)       Balance Overall balance assessment: Needs assistance Sitting-balance support: No upper extremity supported;Feet supported Sitting balance-Leahy Scale: Fair     Standing balance support: Bilateral upper extremity supported;During functional activity Standing balance-Leahy Scale: Poor Standing balance comment: relied on UE support for balance in standing                             Pertinent Vitals/Pain Pain Assessment: No/denies pain    Home Living Family/patient expects to be discharged to:: Private residence Living Arrangements: Spouse/significant other Available Help at Discharge: Family;Available 24 hours/day Type of Home: House Home Access: Level entry     Home Layout: One level Home Equipment: Walker - 2 wheels;Cane - single point;Shower seat;Toilet riser;Bedside commode(2LO2 at night) Additional Comments: would get winded when walking    Prior Function Level of Independence: Independent               Hand Dominance   Dominant Hand: Right    Extremity/Trunk Assessment   Upper Extremity Assessment Upper Extremity Assessment: Defer to OT evaluation    Lower Extremity Assessment Lower Extremity Assessment: Generalized weakness    Cervical / Trunk Assessment Cervical / Trunk Assessment: Normal  Communication   Communication: No difficulties  Cognition Arousal/Alertness: Awake/alert Behavior During  Therapy: WFL for tasks assessed/performed Overall Cognitive Status: Within Functional Limits for tasks assessed                                        General Comments General comments (skin integrity, edema, etc.): Orthostatic BP checked with BP high throughout and flunctuations with each position change but does not appear to be orthostatic.    05/22/18 1405  Vital Signs  BP Location Right Arm  BP Method Automatic  Orthostatic Lying   BP-  Lying 160/62  Pulse- Lying 82  Orthostatic Sitting  BP- Sitting 186/87  Pulse- Sitting 85  Orthostatic Standing at 0 minutes  BP- Standing at 0 minutes 175/76  Pulse- Standing at 0 minutes 93  Orthostatic Standing at 3 minutes  BP- Standing at 3 minutes 191/85  Pulse- Standing at 3 minutes 99  Oxygen Therapy  SpO2 98 %  O2 Device Nasal Cannula  O2 Flow Rate (L/min) 2 L/min     Exercises     Assessment/Plan    PT Assessment Patient needs continued PT services  PT Problem List Decreased strength;Decreased activity tolerance;Decreased mobility;Decreased balance;Decreased knowledge of use of DME;Decreased safety awareness;Decreased knowledge of precautions;Cardiopulmonary status limiting activity       PT Treatment Interventions DME instruction;Gait training;Functional mobility training;Therapeutic activities;Therapeutic exercise;Balance training;Patient/family education    PT Goals (Current goals can be found in the Care Plan section)  Acute Rehab PT Goals Patient Stated Goal: to get better PT Goal Formulation: With patient Time For Goal Achievement: 06/05/18 Potential to Achieve Goals: Good    Frequency Min 3X/week   Barriers to discharge        Co-evaluation               AM-PAC PT "6 Clicks" Mobility  Outcome Measure Help needed turning from your back to your side while in a flat bed without using bedrails?: None Help needed moving from lying on your back to sitting on the side of a flat bed without using bedrails?: None Help needed moving to and from a bed to a chair (including a wheelchair)?: A Little Help needed standing up from a chair using your arms (e.g., wheelchair or bedside chair)?: A Lot Help needed to walk in hospital room?: A Lot Help needed climbing 3-5 steps with a railing? : A Lot 6 Click Score: 17    End of Session Equipment Utilized During Treatment: Gait belt;Oxygen Activity Tolerance: Patient limited by fatigue(DOE 3/4 with standing  EOB) Patient left: in bed;with call bell/phone within reach;with bed alarm set Nurse Communication: Mobility status PT Visit Diagnosis: Unsteadiness on feet (R26.81);Muscle weakness (generalized) (M62.81)    Time: 0037-0488 PT Time Calculation (min) (ACUTE ONLY): 16 min   Charges:   PT Evaluation $PT Eval Moderate Complexity: [place] Pager:  [phone]  Office:  [phone]    Denice Paradise 05/22/2018, 3:35 PM

## 2018-05-22 NOTE — Progress Notes (Signed)
Orthostatic BPs   05/22/18 1405  Vital Signs  BP Location Right Arm  BP Method Automatic  Orthostatic Lying   BP- Lying 160/62  Pulse- Lying 82  Orthostatic Sitting  BP- Sitting 186/87  Pulse- Sitting 85  Orthostatic Standing at 0 minutes  BP- Standing at 0 minutes 175/76  Pulse- Standing at 0 minutes 93  Orthostatic Standing at 3 minutes  BP- Standing at 3 minutes 191/85  Pulse- Standing at 3 minutes 99  Oxygen Therapy  SpO2 98 %  O2 Device Nasal Cannula  O2 Flow Rate (L/min) 2 L/min  Renso Swett,PT Acute Rehabilitation Services Pager:  [phone]  Office:  [phone]

## 2018-05-23 ENCOUNTER — Other Ambulatory Visit: Payer: Self-pay

## 2018-05-23 DIAGNOSIS — K219 Gastro-esophageal reflux disease without esophagitis: Secondary | ICD-10-CM

## 2018-05-23 LAB — GLUCOSE, CAPILLARY: Glucose-Capillary: 103 mg/dL — ABNORMAL HIGH (ref 70–99)

## 2018-05-23 MED ORDER — CALCIUM CARBONATE ANTACID 500 MG PO CHEW
2.0000 | CHEWABLE_TABLET | Freq: Two times a day (BID) | ORAL | Status: DC
  Administered 2018-05-23 – 2018-05-24 (×2): 400 mg via ORAL
  Filled 2018-05-23 (×4): qty 2

## 2018-05-23 NOTE — Plan of Care (Signed)

## 2018-05-23 NOTE — Evaluation (Signed)
Occupational Therapy Evaluation Patient Details Name: [name] MRN: [id number] DOB: 25-Feb-1928 Today's Date: 05/23/2018    History of Present Illness [name] an 84 year old pleasant gentleman with medical history significant of COPD on 2 L nasal cannula, hypothyroidism and hypertension here for management of acute exacerbation of COPD.  Also had syncope on 12/2 when trying to get up.    Clinical Impression   PT admitted with see above. Pt currently with functional limitiations due to the deficits listed below (see OT problem list). Pt requires min(A) to min guard for transfers with frequent seated rest breaks. Pt's wife with recent valve replacement and PNA so will not be able to physically (A) at home. Pt uncertain children ability to (A) at this time. Recommending possible need for transport chair for community use due to distances but pt reports normally driving independently in community. Pt agreeable to discussing with family for possible need this discharge.  Pt will benefit from skilled OT to increase their independence and safety with adls and balance to allow discharge HHOT with oxygen.     Follow Up Recommendations  Home health OT    Equipment Recommendations  Other (comment)(transport chair to help family transfer ?)    Recommendations for Other Services       Precautions / Restrictions Precautions Precautions: Fall Precaution Comments: 2 L oxygen required      Mobility Bed Mobility Overal bed mobility: Independent                Transfers Overall transfer level: Needs assistance Equipment used: Rolling walker (2 wheeled) Transfers: Sit to/from Stand Sit to Stand: Min guard         General transfer comment: pt relies on RW for balance and incr time    Balance Overall balance assessment: Needs assistance Sitting-balance support: No upper extremity supported;Feet supported Sitting balance-Leahy Scale: Good     Standing balance support:  Bilateral upper extremity supported;During functional activity Standing balance-Leahy Scale: Poor                             ADL either performed or assessed with clinical judgement   ADL Overall ADL's : Needs assistance/impaired Eating/Feeding: Independent   Grooming: Wash/dry hands;Wash/dry face;Oral care;Modified independent;Sitting Grooming Details (indicate cue type and reason): requires seated position and incr time after transfer to sink Upper Body Bathing: Min guard   Lower Body Bathing: Moderate assistance   Upper Body Dressing : Min guard   Lower Body Dressing: Moderate assistance   Toilet Transfer: Minimal assistance;RW;BSC           Functional mobility during ADLs: Minimal assistance;Rolling walker General ADL Comments: pt requires a seated rest break and unable to sustain static standing or prolonged distance for transfer     Vision         Perception     Praxis      Pertinent Vitals/Pain Pain Assessment: No/denies pain     Hand Dominance Right   Extremity/Trunk Assessment Upper Extremity Assessment Upper Extremity Assessment: Generalized weakness   Lower Extremity Assessment Lower Extremity Assessment: Defer to PT evaluation   Cervical / Trunk Assessment Cervical / Trunk Assessment: Normal   Communication Communication Communication: No difficulties   Cognition Arousal/Alertness: Awake/alert Behavior During Therapy: WFL for tasks assessed/performed Overall Cognitive Status: Within Functional Limits for tasks assessed  General Comments  oxygen 88% [name] during functional task    Exercises     Shoulder Instructions      Home Living Family/patient expects to be discharged to:: Private residence Living Arrangements: Spouse/significant other Available Help at Discharge: Family;Available 24 hours/day Type of Home: House Home Access: Level entry     Home Layout: One level      Bathroom Shower/Tub: Teacher, early years/pre: Standard Bathroom Accessibility: Yes   Home Equipment: Environmental consultant - 2 wheels;Cane - single point;Shower seat;Toilet riser;Bedside commode   Additional Comments: would get winded when walking      Prior Functioning/Environment Level of Independence: Independent        Comments: 2 L o2 baseline        OT Problem List: Decreased strength;Decreased activity tolerance;Impaired balance (sitting and/or standing);Decreased safety awareness;Decreased knowledge of use of DME or AE;Decreased knowledge of precautions;Cardiopulmonary status limiting activity      OT Treatment/Interventions: Self-care/ADL training;Therapeutic exercise;Energy conservation;DME and/or AE instruction;Modalities;Manual therapy;Therapeutic activities;Patient/family education;Balance training    OT Goals(Current goals can be found in the care plan section) Acute Rehab OT Goals Patient Stated Goal: to return home OT Goal Formulation: With patient Time For Goal Achievement: 06/06/18 Potential to Achieve Goals: Good  OT Frequency: Min 3X/week   Barriers to D/C:            Co-evaluation              AM-PAC OT "6 Clicks" Daily Activity     Outcome Measure Help from another person eating meals?: None Help from another person taking care of personal grooming?: A Little Help from another person toileting, which includes using toliet, bedpan, or urinal?: A Little Help from another person bathing (including washing, rinsing, drying)?: A Lot Help from another person to put on and taking off regular upper body clothing?: A Little Help from another person to put on and taking off regular lower body clothing?: A Lot 6 Click Score: 17   End of Session Equipment Utilized During Treatment: Rolling walker;Oxygen Nurse Communication: Mobility status;Precautions  Activity Tolerance: Patient tolerated treatment well Patient left: in chair;with call bell/phone  within reach;with chair alarm set  OT Visit Diagnosis: Unsteadiness on feet (R26.81);Muscle weakness (generalized) (M62.81)                Time: 5176-1607 OT Time Calculation (min): 24 min Charges:  OT General Charges $OT Visit: 1 Visit OT Evaluation $OT Eval Moderate Complexity: 1 Mod   [name], OTR/L  Acute Rehabilitation Services Pager: [phone] Office: [phone] .   [name] 05/23/2018, 10:34 AM

## 2018-05-23 NOTE — Progress Notes (Signed)
   Subjective: HD #3  Overnight: No acute events  Today, Mr. Fallin was examined at bedside. He states that he continues to be somewhat short of breath and believes that his SOB is worse with sitting up. He endorses drinking a lot of fluids and has a "fair appetite". He denies fevers and chills. He has only been out of bed when the PT was working with him. He states that he was slightly lightheaded getting up during this time. He does not believe that he is well enough to go home-- stating "not until I am able to get up and walk around."   Objective:  Vital signs in last 24 hours: Vitals:   05/22/18 1600 05/22/18 2045 05/23/18 0032 05/23/18 0459  BP: (!) 154/68 (!) 150/54 (!) 131/56 (!) 169/79  Pulse: 79 83 61 71  Resp: (!) 35 (!) 32 (!) 26 (!) 28  Temp: (!) 97.4 F (36.3 C) 98 F (36.7 C) 98.3 F (36.8 C) 97.6 F (36.4 C)  TempSrc: Oral Oral Oral Oral  SpO2: 96% 95% 96% 98%  Weight:    77.3 kg   Const: Lying in bed in no acute distress. Resp: Tachyneic, no wheezes auscultated, there is coarse breath sounds, decreased air flow   Assessment/Plan:  Active Problems:   Chronic respiratory failure with hypoxia (HCC)   COPD with acute exacerbation [facility])  Mr. Reid an 84 year old pleasant gentleman with medical history significant of COPD on 2 L nasal cannula, hypothyroidism and hypertension here for management of acute exacerbation of COPD.  Acute exacerbation of COPD IV: His respiratory status remains unchanged since I last saw him yesterday though he has reassuring pulse-Ox measuring 96% on 3L [place]. Continues to deny cough, sputum production or fevers. He has been working with PT/OT with no difficulties.  -Continue Prednisone 40mg  (day 4/5) -Continue ceftriaxone 1g q24 hrs (day 4) -DuoNeb q8 hrs TID & q4 prn SOB -F/u Ambulatory pulse Ox with oxygen  -PT/OT recommends home health PT/OT with oxygen.  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   Hypothyroidism: Continue Synthroid 50 mcg  Hyperlipidemia: Continue Zocor 5 mg  FEN: Replace electrolytes as needed, regular diet VTE prophylaxis:Subcutaneous Lovenox CODE STATUS:Full code  Dispo: Anticipated discharge in approximately1-2day(s).   [name], MD 05/23/2018, 7:14 AM Pager: [phone] IMTS PGY-1

## 2018-05-23 NOTE — Progress Notes (Signed)
Mobility tech,  [place],  attempted to walk pt. Pt's O2 sats started at 93% room air. Pt started walking and he became short of breath and dizzy. Tech was not able to see what 02 sat was. Tech applied 2L of 02 to pt and pt 02 sat came back to 96% on 2L at rest. Attempted to walk pt again but he said he was to tired now. Will cont to monitor pt and will try again later.

## 2018-05-23 NOTE — Progress Notes (Signed)
Internal Medicine Teaching Service Attending:   I saw and examined the patient. I reviewed the resident's note and I agree with the resident's findings and plan as documented in the resident's note.  Active Problems:   Chronic respiratory failure with hypoxia (HCC)   COPD with acute exacerbation (HCC)   GERD (gastroesophageal reflux disease)  84 year old man hospital day #3 with a moderate COPD exacerbation.  Patient appears a little better today, he is less tachypneic, less tachycardic, no longer lightheaded, using less respiratory accessory muscles to breathe.  He did not feel well enough to get out of bed yesterday, we encouraged out of bed activity today.  Work with PT and OT.  Continue with prednisone, ceftriaxone, and DuoNeb's every 6 hours while awake.  Potentially eligible for discharge to home tomorrow if he continues to make improvement.  Lalla Brothers, MD FACP

## 2018-05-23 NOTE — Progress Notes (Signed)
Student RN cleaned incision on head per order with her instructor.

## 2018-05-24 MED ORDER — ANORO ELLIPTA 62.5-25 MCG/INH IN AEPB: 1.0000 | INHALATION_SPRAY | Freq: Every day | RESPIRATORY_TRACT | 0 refills | Status: DC

## 2018-05-24 NOTE — Progress Notes (Signed)
Physical Therapy Treatment Patient Details Name: [name] MRN: [id number] DOB: 1927/09/13 Today's Date: 05/24/2018    History of Present Illness [name] an 84 year old pleasant gentleman with medical history significant of COPD on 2 L nasal cannula, hypothyroidism and hypertension here for management of acute exacerbation of COPD.  Also had syncope on 12/2 when trying to get up.     PT Comments    Pt admitted with above diagnosis. Pt currently with functional limitations due to the deficits listed below (see PT Problem List). Pt was able to ambulate with RW with min guard assist with chair follow for safety with incr distance today.   Pt needed cues for pursed lip breathing. Needed 2L to keep sats >90%.  Pt feels he is getting back to baseline and will use rollator at home.  HHPT being set up.   Pt will benefit from skilled PT to increase their independence and safety with mobility to allow discharge to the venue listed below.    SATURATION QUALIFICATIONS: (This note is used to comply with regulatory documentation for home oxygen)  Patient Saturations on Room Air at Rest = 92%  Patient Saturations on Room Air while Ambulating = 76%  Patient Saturations on 2 Liters of oxygen while Ambulating = 94%  Please briefly explain why patient needs home oxygen:Pt desats on RA with activity.  Follow Up Recommendations  Home health PT;Supervision/Assistance - 24 hour     Equipment Recommendations  None recommended by PT    Recommendations for Other Services       Precautions / Restrictions Precautions Precautions: Fall Precaution Comments: 2 L oxygen required Restrictions Weight Bearing Restrictions: No    Mobility  Bed Mobility Overal bed mobility: Independent                Transfers Overall transfer level: Needs assistance Equipment used: Rolling walker (2 wheeled) Transfers: Sit to/from Stand Sit to Stand: Min guard         General transfer comment:  Pt needed cues for hand placement.  Pt relies on RW for balance and incr time  Ambulation/Gait Ambulation/Gait assistance: Min guard;+2 safety/equipment Gait Distance (Feet): 260 Feet(85 feet, 90 feet, 85 feet) Assistive device: Rolling walker (2 wheeled) Gait Pattern/deviations: Step-through pattern;Decreased stride length   Gait velocity interpretation: <1.31 ft/sec, indicative of household ambulator General Gait Details: Pt was able to incr distance with ambulation with steady gait with RW.  Pt states he will use rollator at home at all times.  Pt had to be cued for pursed lip breathing and did desat on RA and needs 2L to keep sats >90%.  Pt needed 3 sitting rest breaks. Needed sitting rests to purse lip breathe and recover DOE 3/4 with ambualtion.    Stairs             Wheelchair Mobility    Modified Rankin (Stroke Patients Only)       Balance Overall balance assessment: Needs assistance Sitting-balance support: No upper extremity supported;Feet supported Sitting balance-Leahy Scale: Good     Standing balance support: Bilateral upper extremity supported;During functional activity Standing balance-Leahy Scale: Poor Standing balance comment: relied on UE support for balance in standing                            Cognition Arousal/Alertness: Awake/alert Behavior During Therapy: WFL for tasks assessed/performed Overall Cognitive Status: Within Functional Limits for tasks assessed  Exercises      General Comments        Pertinent Vitals/Pain Pain Assessment: No/denies pain    Home Living                      Prior Function            PT Goals (current goals can now be found in the care plan section) Acute Rehab PT Goals Patient Stated Goal: to return home Progress towards PT goals: Progressing toward goals    Frequency    Min 3X/week      PT Plan Current plan remains  appropriate    Co-evaluation              AM-PAC PT "6 Clicks" Mobility   Outcome Measure  Help needed turning from your back to your side while in a flat bed without using bedrails?: None Help needed moving from lying on your back to sitting on the side of a flat bed without using bedrails?: None Help needed moving to and from a bed to a chair (including a wheelchair)?: A Little Help needed standing up from a chair using your arms (e.g., wheelchair or bedside chair)?: A Little Help needed to walk in hospital room?: A Little Help needed climbing 3-5 steps with a railing? : A Lot 6 Click Score: 19    End of Session Equipment Utilized During Treatment: Gait belt;Oxygen Activity Tolerance: Patient limited by fatigue(DOE 3/4 with walking) Patient left: with call bell/phone within reach;in chair;with chair alarm set Nurse Communication: Mobility status PT Visit Diagnosis: Unsteadiness on feet (R26.81);Muscle weakness (generalized) (M62.81)     Time: 9983-3825 PT Time Calculation (min) (ACUTE ONLY): 24 min  Charges:  $Gait Training: 23-37 mins                     [place] Pager:  [phone]  Office:  [place] 05/24/2018, 11:56 AM

## 2018-05-24 NOTE — Care Management Important Message (Signed)
Important Message  Patient Details  Name: [name] MRN: [id number] Date of Birth: [birth year]   Medicare Important Message Given:  Yes    [name] P [place] 05/24/2018, 2:37 PM

## 2018-05-24 NOTE — Progress Notes (Signed)
   Subjective: HD #4  Overnight: No acute events reported   Today, [name] was examined also found undergoing physical therapy.  He was able to ambulate in the hallway with his supplemental oxygen at 2 L nasal cannula and maintain oxygen nation above 92%.  He was reevaluated in the afternoon and reported that he was stable and ready to go home.  He has supplemental oxygen with both O2 concentrator and oxygen tank at home.  He follows up with [facility] and states that he would contact them if extra oxygen is needed.  He is well aware of his follow-up with Dr. Halford [name], pulmonology on May 31, 2018 as well as the additional inhaler he has been discharged with.  Objective:  Vital signs in last 24 hours: Vitals:   05/23/18 2015 05/23/18 2038 05/24/18 0004 05/24/18 0348  BP: (!) 148/72  (!) 138/58 (!) 163/73  Pulse: 76  67 68  Resp: (!) 39  (!) 22 (!) 30  Temp: (!) 97.5 F (36.4 C)  97.7 F (36.5 C) 97.6 F (36.4 C)  TempSrc: Oral  Oral Oral  SpO2: 97% 98% 99% 98%  Weight:    77.4 kg   Const: In no acute distress, comfortably sitting on recliner Psych: Calm, mood appropriate.  Assessment/Plan:  Active Problems:   Chronic respiratory failure with hypoxia (HCC)   COPD with acute exacerbation (HCC)   GERD (gastroesophageal reflux disease)  [name] an 84 year old pleasant gentleman with medical history significant of COPD on 2 L nasal cannula, hypothyroidism and hypertension here for management of acute exacerbation of COPD.  Acute moderate exacerbation of COPD IV:  Overall he has had great significant improvement since admission.  He was able to ambulate with physical therapy and maintain oxygenation above 92% on 2 L nasal cannula though he did desat to the 70s when O2 was discontinued.  This however improved when nasal cannula was reinitiated.  He reported that his very well stable to be discharged.  He has been advised to use supplemental oxygen daily and contact  [facility] whenever he needs additional supplies.  He states that he has an oxygen concentrator and additional tanks at home. -Continue Prednisone 40mg  (day 5/5) -Continue ceftriaxone 1g q24 hrs (day 5) -DuoNeb q8 hrs TID & q4 prn SOB -PT/OT recommends home health PT/OT with oxygen. -We will discharge with Anoro Ellipta.  He has follow-up appointment with Dr. Halford [name], pulmonology on May 31, 2018 at 10 AM.  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  Hypothyroidism: Continue Synthroid 50 mcg  Hyperlipidemia: Continue Zocor 5 mg  FEN: Replace electrolytes as needed, regular diet VTE prophylaxis:Subcutaneous Lovenox CODE STATUS:Full code  Dispo: Anticipated discharge in approximately1-2day(s).  Sean Rosenthal, MD 05/24/2018, 6:08 AM Pager: [phone] IMTS PGY-1

## 2018-05-24 NOTE — Care Management Note (Signed)
Case Management Note  Patient Details  Name: [name] MRN: [id number] Date of Birth: 02/09/1928  Subjective/Objective: Pt presented for COPD exacerbation. PTA from home with wife. Pt has DME Cane, RW, Oxygen at night 2 L [facility]. CM to follow for increased liter flow.                     Action/Plan: CM discussed with patient in regards to transition of care needs. Patient is agreeable to [facility] RN/PT services -CM provided CMS Medicare.G[employer] Compare Post Acute Care list to patient and patient chose [facility] for Services. Copy placed in shadow Chart. CM did make referral with Dan with [facility] and SOC to begin within 24-48 hours post transition home. Pt will need HH RN/PT orders and F2F before transitioning home. If needs increased 02 Liter Flow will need an additional order. CM will continue to monitor.    Expected Discharge Date:                  Expected Discharge Plan:  [place]  In-House Referral:  NA  Discharge planning Services  CM Consult  Post Acute Care Choice:  Home Health Choice offered to:  Patient  DME Arranged:  Oxygen(May need increased liter flow- Active with [facility] for DME. ) DME Agency:  [place] Arranged:  RN, Disease Management, PT [place] Agency:  [place]  Status of Service:  Completed, signed off  If discussed at [place] of Stay Meetings, dates discussed:    Additional Comments:  Bethena Roys, RN 05/24/2018, 1:43 PM

## 2018-05-24 NOTE — Progress Notes (Signed)
SATURATION QUALIFICATIONS: (This note is used to comply with regulatory documentation for home oxygen)  Patient Saturations on Room Air at Rest = 92%  Patient Saturations on Room Air while Ambulating = 76%  Patient Saturations on 2 Liters of oxygen while Ambulating = 94%  Please briefly explain why patient needs home oxygen:Pt desats on RA with activity. Will need home O2.  Thanks.  Wayman Hoard,PT Acute Rehabilitation Services Pager:  [phone]  Office:  [place] Pager:  [phone]  Office:  [phone]

## 2018-05-24 NOTE — Progress Notes (Signed)
Occupational Therapy Treatment Patient Details Name: [name] MRN: [id number] DOB: [birth year] Today's Date: 05/24/2018    History of present illness Mr. Walen an 84 year old pleasant gentleman with medical history significant of COPD on 2 L nasal cannula, hypothyroidism and hypertension here for management of acute exacerbation of COPD.  Also had syncope on 12/2 when trying to get up.    OT comments  I have discussed the patient's current level of function related to home level care  with the patient only ( not other famiy present or contacted) Pt reports wife has health issues and will be limited (A). Pt does mention daughter and son but is uncertain if they will (A) upon d/c.  They acknowledge understanding of this and feel they can provide the level of care the patient will need at home.       Follow Up Recommendations  Home health OT    Equipment Recommendations  Other (comment)(possible need for transport chair )    Recommendations for Other Services      Precautions / Restrictions Precautions Precautions: Fall Precaution Comments: 2 L oxygen required Restrictions Weight Bearing Restrictions: No       Mobility Bed Mobility Overal bed mobility: Independent             General bed mobility comments: in chair on arrival  Transfers Overall transfer level: Needs assistance Equipment used: Rolling walker (2 wheeled) Transfers: Sit to/from Stand Sit to Stand: Min guard         General transfer comment: Pt needed cues for hand placement.  Pt relies on RW for balance and incr time    Balance Overall balance assessment: Needs assistance Sitting-balance support: No upper extremity supported;Feet supported Sitting balance-Leahy Scale: Good     Standing balance support: Bilateral upper extremity supported;During functional activity Standing balance-Leahy Scale: Poor Standing balance comment: relied on UE support for balance in standing                            ADL either performed or assessed with clinical judgement   ADL Overall ADL's : Needs assistance/impaired                     Lower Body Dressing: Min guard                 General ADL Comments: educated with handout on energy conservation. pt is able to figure 4 cross bil LE at this time for LB dressing. pt able to recall 2 energy conservation techniques. pt has questions for MD regarding "can anyone tell me why this happened to me."      Vision       Perception     Praxis      Cognition Arousal/Alertness: Awake/alert Behavior During Therapy: [facility] for tasks assessed/performed Overall Cognitive Status: Within Functional Limits for tasks assessed                                          Exercises     Shoulder Instructions       General Comments on [place] throughout session . pt reports at home having a pulse ox and discussed frequent checks. pt has an oxygen cord that will reach the bathroom currently from his bed    Pertinent Vitals/ Pain       Pain Assessment:  No/denies pain  Home Living                                          Prior Functioning/Environment              Frequency  Min 3X/week        Progress Toward Goals  OT Goals(current goals can now be found in the care plan section)  Progress towards OT goals: Progressing toward goals  Acute Rehab OT Goals Patient Stated Goal: to return home OT Goal Formulation: With patient Time For Goal Achievement: 06/06/18 Potential to Achieve Goals: Good ADL Goals Pt Will Perform Upper Body Dressing: with supervision Pt Will Perform Lower Body Dressing: with supervision Pt Will Transfer to Toilet: with supervision;ambulating;regular height toilet Additional ADL Goal #1: pt will demonstrates 2 enery conservation strategies for adls ( pRN use of EC handout as needed)  Plan Discharge plan remains appropriate    Co-evaluation                  AM-PAC OT "6 Clicks" Daily Activity     Outcome Measure   Help from another person eating meals?: None Help from another person taking care of personal grooming?: A Little Help from another person toileting, which includes using toliet, bedpan, or urinal?: A Little Help from another person bathing (including washing, rinsing, drying)?: A Lot Help from another person to put on and taking off regular upper body clothing?: A Little Help from another person to put on and taking off regular lower body clothing?: A Lot 6 Click Score: 17    End of Session Equipment Utilized During Treatment: Rolling walker;Oxygen  OT Visit Diagnosis: Unsteadiness on feet (R26.81);Muscle weakness (generalized) (M62.81)   Activity Tolerance Patient tolerated treatment well   Patient Left in chair;with call bell/phone within reach;with chair alarm set   Nurse Communication Mobility status;Precautions        Time: 5621-3086 OT Time Calculation (min): 14 min  Charges: OT General Charges $OT Visit: 1 Visit OT Treatments $Self Care/Home Management : 8-22 mins   Jeri Modena, OTR/L  Acute Rehabilitation Services Pager: [phone] Office: [phone] .    Jeri Modena 05/24/2018, 2:58 PM

## 2018-05-26 ENCOUNTER — Telehealth: Payer: Self-pay | Admitting: Pulmonary Disease

## 2018-05-26 ENCOUNTER — Other Ambulatory Visit: Payer: Self-pay | Admitting: Internal Medicine

## 2018-05-26 DIAGNOSIS — J439 Emphysema, unspecified: Secondary | ICD-10-CM

## 2018-05-26 MED ORDER — ANORO ELLIPTA 62.5-25 MCG/INH IN AEPB: 1.0000 | INHALATION_SPRAY | Freq: Every day | RESPIRATORY_TRACT | 0 refills | Status: DC

## 2018-05-26 NOTE — Telephone Encounter (Signed)
Spoke with Stacy at [facility]-PT 1 week 1 and 2 week 3. Work on  Contractor. Gave verbal okay. Nothing more needed at this time.

## 2018-05-29 ENCOUNTER — Telehealth: Payer: Self-pay | Admitting: Internal Medicine

## 2018-05-29 ENCOUNTER — Telehealth: Payer: Self-pay | Admitting: Pulmonary Disease

## 2018-05-29 NOTE — Telephone Encounter (Signed)
Attempted to call [name] with [facility] but unable to reach her. Left message for [name] to return call.

## 2018-05-29 NOTE — Telephone Encounter (Signed)
lmtcb for Beth with [facility].

## 2018-05-29 NOTE — Telephone Encounter (Signed)
Used wrong dr.Stanley A Dalton

## 2018-05-29 NOTE — Telephone Encounter (Signed)
[facility]  said pt will call if he needs to be seen

## 2018-05-29 NOTE — Telephone Encounter (Signed)
Attempted to call Ashby, [facility] RN but unable to reach her. Left message for Beth to return call.

## 2018-05-29 NOTE — Telephone Encounter (Signed)
Beth/[facility] returned call, CB is [phone].

## 2018-05-29 NOTE — Telephone Encounter (Signed)
LMTCB for [name] with [facility].

## 2018-05-29 NOTE — Telephone Encounter (Signed)
[name] returning call, CB is [phone].  She asks if we can leave specific message on her VM.

## 2018-05-30 ENCOUNTER — Encounter: Payer: Self-pay | Admitting: Primary Care

## 2018-05-30 ENCOUNTER — Telehealth: Payer: Self-pay | Admitting: Primary Care

## 2018-05-30 ENCOUNTER — Telehealth: Payer: Self-pay | Admitting: Pulmonary Disease

## 2018-05-30 ENCOUNTER — Ambulatory Visit (INDEPENDENT_AMBULATORY_CARE_PROVIDER_SITE_OTHER): Payer: Medicare Other | Admitting: Primary Care

## 2018-05-30 DIAGNOSIS — J441 Chronic obstructive pulmonary disease with (acute) exacerbation: Secondary | ICD-10-CM

## 2018-05-30 MED ORDER — UMECLIDINIUM-VILANTEROL 62.5-25 MCG/INH IN AEPB: 1.0000 | INHALATION_SPRAY | Freq: Every day | RESPIRATORY_TRACT | 1 refills | Status: DC

## 2018-05-30 MED ORDER — PREDNISONE 10 MG PO TABS: ORAL_TABLET | ORAL | 0 refills | Status: DC

## 2018-05-30 NOTE — Progress Notes (Signed)
@Patient  ID: [name], male    DOB: 15-Jan-1928, 84 y.o.   MRN: [id number]  Chief Complaint  Patient presents with  . Follow-up    fair, SOB with exertion, cough with white to yellow mucus, wheezing    Referring provider: [name], MD  HPI: 84 year male, former smoker. PMH significant COPD GOLD IV (oxygen dependent). Patient of Dr. Halford Chessman, last seen 08/31/17. Recent hospital admission for COPD exacerbation.   Hospital course 11/30-12/4: Presented to ED with worsening shortness of breath with associated cough and green mucus production. Received 5 day course of prednisone 40mg , ceftriaxone and breathing treatments. CXR 11/30 showed hyperaerated, clear lungs.  Discharged with supplemental oxygen and Anoro Ellipta.   05/30/2018 Patient presents today for hosp follow-up, discharged home 6 days ago. Continues to have a cough, occasionally productive with mostly clear sputum color. States that he has not had much energy or appetite. Reports that his breathing is so-so. Discharge with no additional antibiotic or prednisone.Takes daily prednisone 2.5mg  every other day. He is on [employer], states that he is unable to get through [place]. Continues Asmanex. Also taking Stiolto at bedtime. It had been recommended that he try Trelegy but not available. Using nebulizer three times a day. Discharge summary not available for today's office visit. Getting physical therapy, documented O2 desaturation on 2L but quickly recovers   Allergies  Allergen Reactions  . Sulfa Antibiotics   . Sulfonamide Derivatives     Unknown - as a child    Immunization History  Administered Date(s) Administered  . Influenza Split 04/30/2011, 04/02/2017  . Influenza Whole 03/21/2010, 03/21/2012  . Influenza, High Dose Seasonal PF 04/26/2016  . Influenza,inj,Quad PF,6+ Mos 03/12/2013, 03/21/2014, 04/07/2015  . Pneumococcal Conjugate-13 10/26/2013  . Pneumococcal Polysaccharide-23 03/21/2008  . Tdap 03/17/2014     Past Medical History:  Diagnosis Date  . Arthritis    osteoarthritis  . Cancer ([place])    skin cancer only.-no melanoma.  Marland Kitchen COPD (chronic obstructive pulmonary disease) ([place])   . History of kidney stones    lithotripsy x1   . History of oxygen administration    Oxygen @ 2 l/m nasally bedtime  . Hyperlipidemia   . Hypothyroidism   . Pulmonary nodule, right   . 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 Tobacco abuse    Quit 09/17/10    Tobacco History: Social History   Tobacco Use  Smoking Status Former Smoker  . Packs/day: 1.00  . Years: 55.00  . Pack years: 55.00  . Types: Cigarettes  . Last attempt to quit: 04/22/2011  . Years since quitting: 7.1  Smokeless Tobacco Never Used  Tobacco Comment   occasionally smoked 2-3 times a week--1 cig each time   Counseling given: Not Answered Comment: occasionally smoked 2-3 times a week--1 cig each time   Outpatient Medications Prior to Visit  Medication Sig Dispense Refill  . albuterol (PROVENTIL HFA;VENTOLIN HFA) 108 (90 BASE) MCG/ACT inhaler Inhale 2 puffs into the lungs every 4 (four) hours as needed for wheezing or shortness of breath.    Marland Kitchen albuterol (PROVENTIL) (2.5 MG/3ML) 0.083% nebulizer solution USE 1 VIAL IN NEBULIZER EVERY 6 HOURS. Generic: VENTOLIN (Patient taking differently: Take 2.5 mg by nebulization every 6 (six) hours as needed. ) 120 vial 10  . guaiFENesin (MUCINEX) 600 MG 12 hr tablet Take 2 tablets (1,200 mg total) by mouth 2 (two) times daily as needed.    Marland Kitchen HYDROcodone-acetaminophen (NORCO/VICODIN) 5-325 MG  tablet Take 1-2 tablets by mouth every 6 (six) hours as needed. 10 tablet 0  . levothyroxine (SYNTHROID, LEVOTHROID) 50 MCG tablet Take 50 mcg by mouth every evening.     . mometasone (ASMANEX) 220 MCG/INH inhaler Inhale 2 puffs into the lungs daily.    . predniSONE (DELTASONE) 5 MG tablet TAKE 1 TABLET (5 MG TOTAL) BY MOUTH DAILY WITH BREAKFAST. (Patient taking differently: Take 5 mg by  mouth every other day. ) 30 tablet 3  . simvastatin (ZOCOR) 5 MG tablet Take 5 mg by mouth at bedtime.      . triamcinolone cream (KENALOG) 0.1 % Apply 1 application topically 2 (two) times daily.     Jearl Klinefelter ELLIPTA 62.5-25 MCG/INH AEPB Inhale 1 puff into the lungs daily. (Patient not taking: Reported on 05/30/2018) 60 each 0   No facility-administered medications prior to visit.    Review of Systems  Review of Systems  Constitutional: Positive for appetite change and fatigue.  HENT: Negative.   Respiratory: Positive for cough, shortness of breath and wheezing.   Cardiovascular: Negative.    Physical Exam  BP 120/62 (BP Location: Left Arm, Cuff Size: Normal)   Pulse (!) 109   Temp 97.9 F (36.6 C)   Ht 5\' 9"  (1.753 m)   Wt 167 lb (75.8 kg)   SpO2 96%   BMI 24.66 kg/m  Physical Exam Constitutional:      Appearance: He is well-developed and well-nourished.  HENT:     Head: Normocephalic and atraumatic.  Eyes:     Extraocular Movements: EOM normal.     Pupils: Pupils are equal, round, and reactive to light.  Neck:     Musculoskeletal: Normal range of motion and neck supple.  Cardiovascular:     Rate and Rhythm: Normal rate and regular rhythm.     Comments: HR 103 regular  Pulmonary:     Effort: Pulmonary effort is normal. No respiratory distress.     Comments: Clear, diminished  O2 99% 2L  Musculoskeletal: Normal range of motion.  Skin:    General: Skin is warm.  Neurological:     Mental Status: He is alert and oriented to person, place, and time.  Psychiatric:        Mood and Affect: Mood and affect normal.        Behavior: Behavior normal.        Thought Content: Thought content normal.        Judgment: Judgment normal.     Lab Results:   CBC    Component Value Date/Time   WBC 14.3 (H) 05/22/2018 0342   RBC 3.96 (L) 05/22/2018 0342   HGB 11.7 (L) 05/22/2018 0342   HCT 36.6 (L) 05/22/2018 0342   PLT 216 05/22/2018 0342   MCV 92.4 05/22/2018 0342   MCH  29.5 05/22/2018 0342   MCHC 32.0 05/22/2018 0342   RDW 13.2 05/22/2018 0342   LYMPHSABS 2.3 05/20/2018 1109   MONOABS 0.9 05/20/2018 1109   EOSABS 0.1 05/20/2018 1109   BASOSABS 0.0 05/20/2018 1109    BMET    Component Value Date/Time   NA 136 05/22/2018 0342   K 4.4 05/22/2018 0342   CL 103 05/22/2018 0342   CO2 28 05/22/2018 0342   GLUCOSE 132 (H) 05/22/2018 0342   BUN 25 (H) 05/22/2018 0342   CREATININE 1.19 05/22/2018 0342   CALCIUM 8.1 (L) 05/22/2018 0342   GFRNONAA 54 (L) 05/22/2018 0342   GFRAA >60 05/22/2018 6222  BNP    Component Value Date/Time   BNP 27.0 05/20/2018 1109    ProBNP    Component Value Date/Time   PROBNP <30.0 12/08/2006 0115    Imaging: Dg Chest Port 1 View  Result Date: 05/20/2018 CLINICAL DATA:  Productive cough and short of breath for 2 days EXAM: PORTABLE CHEST 1 VIEW COMPARISON:  04/07/2015 FINDINGS: Lungs are hyperaerated and clear. Normal heart size. No pneumothorax. No pleural effusion. Linear markings along the left and right heart borders are felt to represent overlying vessels rather than an indication of mediastinal emphysema. IMPRESSION: No active disease. Electronically Signed   By: Marybelle Killings M.D.   On: 05/20/2018 11:33     Assessment & Plan:   COPD with acute exacerbation ([place]) - Prolonged COPD exacerbation; slow to recover - Needs additional prednisone taper  - Continue 2-3 L oxygen with exertion and at night  - Continue Asmanex (ICS) 2 puffs daily - Restart Anoro (LABA/LAMA) 1 puff daily (samples given) - Stop Stiolto. Consider TRELEGY in the future if patient can get assistance.  - Use albuterol nebulizer every 6 hours for breakthrough wheezing or shortness of breath - Follow up in 1-2 weeks   Martyn Ehrich, NP 06/05/2018

## 2018-05-30 NOTE — Telephone Encounter (Signed)
Unable to contact Clair Gulling, I will route this message over to [facility] as an FYI since patient has an appointment with her this afternoon.

## 2018-05-30 NOTE — Patient Instructions (Signed)
Continue Asmanex 2 puffs daily  Restart Anoro 1 puff daily (samples given)  Use albuterol nebulizer every 6 hours for breakthrough wheezing or shortness of breath  Prednisone tapers as prescribed  Continues 2-3 L oxygen with exertion and at night   Follow up in 1-2 weeks with Dr. Halford Chessman if possible please

## 2018-05-30 NOTE — Telephone Encounter (Signed)
[name], [name], is returning call. Per [name], she has spoken with the pt's wife and wife agreed to call if pt needs to be worked in sooner than his sched apt, 12/11. Cb is [phone].

## 2018-05-30 NOTE — Telephone Encounter (Signed)
Attempted to contact Beth with [facility]. I did not receive an answer. I have left a message for her to return our call.

## 2018-05-30 NOTE — Telephone Encounter (Signed)
Patient has reduced lung sounds and patient reports sputum.  Patient has appt this afternoon with EW and Clair Gulling wanted to make sure she had this information.

## 2018-05-30 NOTE — Telephone Encounter (Signed)
Called and spoke with pt's wife to see if we could change pt's appt from tomorrow to today with Derl Barrow. [place] stated that would be fine. I have changed pt's appt to today, 12/10 at 3:00 with Derl Barrow due to the cough not being better and also pt is still weak.    Called and spoke with [facility] from [facility] letting her know this had been done. Beth expressed understanding. Nothing further needed.

## 2018-05-30 NOTE — Telephone Encounter (Signed)
Kat from [place] with [facility] is requesting to see patient for continued OT for two times a week for two weeks, and then one time a week for one week.   VS please advise if giving the verbal is ok, thank you.

## 2018-05-31 ENCOUNTER — Inpatient Hospital Stay: Payer: Non-veteran care | Admitting: Primary Care

## 2018-05-31 NOTE — Telephone Encounter (Signed)
Called and spoke with patients wife she is aware and verbalized understanding. Patient will take the stiolto. Nothing further needed.

## 2018-05-31 NOTE — Telephone Encounter (Signed)
Patient wife([place] [phone]) requesting to speak to BW in regards to yesterday's OV

## 2018-05-31 NOTE — Telephone Encounter (Signed)
Okay to send order. 

## 2018-05-31 NOTE — Telephone Encounter (Signed)
Stiolto and Anoro are in the same class, should not take both (one of the other). Thank you

## 2018-05-31 NOTE — Telephone Encounter (Signed)
Called Wendelyn Breslow from OT with [facility] per Dr. Halford Chessman okay to give order for treatment for OT.  Told her to call back if she needed anything further. Closing out message

## 2018-05-31 NOTE — Telephone Encounter (Signed)
Called and spoke with patients wife, she stated that she was to call back and let BW know the inhaler that the patient was on. The inhaler is the [employer] Respimat

## 2018-06-04 NOTE — Discharge Summary (Signed)
Name: [name] MRN: [id number] DOB: 11/11/1927 84 y.o. PCP: [name], MD  Date of Admission: 05/20/2018 10:52 AM Date of Discharge: 05/24/2018 Attending Physician: Dr. Evette Doffing   Discharge Diagnosis: 1. Acute moderate exacerbation of COPDIV 2. GERD 3. 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 Hypothyroidism 5. Hyperlipidemia   Discharge Medications: Allergies as of 05/24/2018      Reactions   Sulfa Antibiotics    Sulfonamide Derivatives    Unknown - as a child      Medication List    TAKE these medications   albuterol 108 (90 Base) MCG/ACT inhaler Commonly known as:  PROVENTIL HFA;VENTOLIN HFA Inhale 2 puffs into the lungs every 4 (four) hours as needed for wheezing or shortness of breath. What changed:  Another medication with the same name was changed. Make sure you understand how and when to take each.   albuterol (2.5 MG/3ML) 0.083% nebulizer solution Commonly known as:  PROVENTIL USE 1 VIAL IN NEBULIZER EVERY 6 HOURS. Generic: VENTOLIN What changed:    how much to take  how to take this  when to take this  reasons to take this  additional instructions   guaiFENesin 600 MG 12 hr tablet Commonly known as:  MUCINEX Take 2 tablets (1,200 mg total) by mouth 2 (two) times daily as needed.   HYDROcodone-acetaminophen 5-325 MG tablet Commonly known as:  NORCO/VICODIN Take 1-2 tablets by mouth every 6 (six) hours as needed.   levothyroxine 50 MCG tablet Commonly known as:  SYNTHROID, LEVOTHROID Take 50 mcg by mouth every evening.   mometasone 220 MCG/INH inhaler Commonly known as:  ASMANEX Inhale 2 puffs into the lungs daily.   predniSONE 5 MG tablet Commonly known as:  DELTASONE TAKE 1 TABLET (5 MG TOTAL) BY MOUTH DAILY WITH BREAKFAST. What changed:  See the new instructions.   simvastatin 5 MG tablet Commonly known as:  ZOCOR Take 5 mg by mouth at bedtime.   triamcinolone cream 0.1 % Commonly known as:   KENALOG Apply 1 application topically 2 (two) times daily.       Disposition and follow-up:   [name] was discharged from [facility] in [place] condition.  At the hospital follow up visit please address:  1.  Acute moderate exacerbation of COPDIV: Please ensure compliance with supplemental O2 and inhalers  2.  Labs / imaging needed at time of follow-up: None  3.  Pending labs/ test needing follow-up: None   Follow-up Appointments: [place] Follow up.   Why:  Registered Nurse, Physical Therapy Contact information: [place] [address] [phone]        [name], MD. Call in 1 day(s).   Specialty:  Pulmonary Disease Why:  Dec 11th at [facility] information: [address] [place] [address] [phone]           Hospital Course by problem list: 1. Acute moderate exacerbation of COPDIV: [name] is an 84 year old pleasant gentleman with oxygen dependent COPD (on 2 L nasal cannula at home), hypothyroidism and hypertension who presented with worsening shortness of breath with associated increased green sputum production.  He was managed with 5-day course of prednisone 40 mg, ceftriaxone and breathing treatments.  He was assessed by physical therapy and Occupational Therapy who recommended home health with continuous supplemental oxygen.  In addition to his home inhalers, he was discharged with  Anoro Ellipta and an appointment was made for him to follow-up with Dr. Halford Chessman, pulmonology on May 31, 2018.  Discharge Vitals:   BP (!) 162/72 (BP Location: Left Arm)   Pulse 89   Temp 97.8 F (36.6 C) (Oral)   Resp 18   Wt 77.4 kg   SpO2 96%   BMI 24.48 kg/m   Pertinent Labs, Studies, and Procedures:  CBC Latest Ref Rng & Units 05/22/2018 05/21/2018 05/20/2018  WBC 4.0 - 10.5 K/uL 14.3(H) 11.8(H) 8.2  Hemoglobin 13.0 - 17.0 g/dL 11.7(L) 12.0(L) 14.2   Hematocrit 39.0 - 52.0 % 36.6(L) 36.7(L) 44.8  Platelets 150 - 400 K/uL 216 207 206   BMP Latest Ref Rng & Units 05/22/2018 05/21/2018 05/20/2018  Glucose 70 - 99 mg/dL 132(H) 156(H) 118(H)  BUN 8 - 23 mg/dL 25(H) 24(H) 20  Creatinine 0.61 - 1.24 mg/dL 1.19 1.27(H) 1.46(H)  Sodium 135 - 145 mmol/L 136 136 138  Potassium 3.5 - 5.1 mmol/L 4.4 4.4 4.4  Chloride 98 - 111 mmol/L 103 103 101  CO2 22 - 32 mmol/L 28 22 26   Calcium 8.9 - 10.3 mg/dL 8.1(L) 8.1(L) 8.7(L)    Discharge Instructions: Discharge Instructions    Call MD for:  difficulty breathing, headache or visual disturbances   Complete by:  As directed    Diet - low sodium heart healthy   Complete by:  As directed    Discharge instructions   Complete by:  As directed    [name],   It was a pleasure taking care of you here at the hospital. You were admitted to the hospital because of a flare up of your COPD. We treated you with steroids, breathing treatment and antibiotics.   Here are my recommendations moving forward: 1.  I would like for you to follow-up with Dr. Halford Chessman (pulmonology) 2.  Please use your supplemental oxygen daily and even when you are walking. 3.  I would also like to be consistent with your inhalers.  I will write you a prescription for a new inhaler called Anoro Ellipta.  You will take 1 puff once a day. We have provided you with a sample of the new inhaler   ~Dr. Eileen Stanford   Increase activity slowly   Complete by:  As directed       Signed: [name], MD 06/04/2018, 9:12 PM   Pager: [phone] IMTS PGY-1

## 2018-06-05 ENCOUNTER — Encounter: Payer: Self-pay | Admitting: Primary Care

## 2018-06-05 NOTE — Assessment & Plan Note (Signed)
-   Prolonged COPD exacerbation; slow to recover - Needs additional prednisone taper  - Continue 2-3 L oxygen with exertion and at night  - Continue Asmanex (ICS) 2 puffs daily - Restart Anoro (LABA/LAMA) 1 puff daily (samples given) - Stop Stiolto. Consider TRELEGY in the future if patient can get assistance.  - Use albuterol nebulizer every 6 hours for breakthrough wheezing or shortness of breath - Follow up in 1-2 weeks

## 2018-06-06 NOTE — Progress Notes (Signed)
Reviewed and agree with assessment/plan.   Javia Dillow, MD [place] [facility] 06/16/2016, 12:24 PM Pager:  [phone]  

## 2018-06-07 ENCOUNTER — Telehealth: Payer: Self-pay | Admitting: Primary Care

## 2018-06-07 ENCOUNTER — Encounter: Payer: Self-pay | Admitting: Primary Care

## 2018-06-07 ENCOUNTER — Ambulatory Visit (INDEPENDENT_AMBULATORY_CARE_PROVIDER_SITE_OTHER): Payer: Medicare Other | Admitting: Primary Care

## 2018-06-07 VITALS — BP 130/66 | HR 81 | Temp 98.0°F | Ht 69.0 in | Wt 168.2 lb

## 2018-06-07 DIAGNOSIS — J432 Centrilobular emphysema: Secondary | ICD-10-CM | POA: Diagnosis not present

## 2018-06-07 DIAGNOSIS — J9611 Chronic respiratory failure with hypoxia: Secondary | ICD-10-CM | POA: Diagnosis not present

## 2018-06-07 DIAGNOSIS — J441 Chronic obstructive pulmonary disease with (acute) exacerbation: Secondary | ICD-10-CM | POA: Diagnosis not present

## 2018-06-07 DIAGNOSIS — J449 Chronic obstructive pulmonary disease, unspecified: Secondary | ICD-10-CM | POA: Diagnosis not present

## 2018-06-07 MED ORDER — TIOTROPIUM BROMIDE-OLODATEROL 2.5-2.5 MCG/ACT IN AERS: 2.0000 | INHALATION_SPRAY | Freq: Every day | RESPIRATORY_TRACT | 6 refills | Status: AC

## 2018-06-07 MED ORDER — PREDNISONE 5 MG PO TABS: ORAL_TABLET | ORAL | 3 refills | Status: DC

## 2018-06-07 NOTE — Assessment & Plan Note (Addendum)
Finish Anoro samples then resume Stiolto 2 puffs daily  Continue Asmanex 2 puffs daily  Stay on prednisone 5mg  daily Refer back to pulmonary rehab (did not complete) FU in 1-3 months with Dr. Halford Chessman

## 2018-06-07 NOTE — Assessment & Plan Note (Addendum)
Resolved

## 2018-06-07 NOTE — Progress Notes (Signed)
@Patient  ID: [name], male    DOB: 06-03-1928, 84 y.o.   MRN: [id number]  Chief Complaint  Patient presents with  . Follow-up    breathing a little better, SOB with exertion, little cough with occasional white mucus    Referring provider: [name], MD  HPI: 84 year male, former smoker. PMH significant COPD GOLD IV (oxygen dependent). Patient of Dr. Halford Chessman, last seen 08/31/17. Recent hospital admission for COPD exacerbation.   Hospital course 11/30-12/4: Presented to ED with worsening shortness of breath with associated cough and green mucus production. Received 5 day course of prednisone 40mg , ceftriaxone and breathing treatments. CXR 11/30 showed hyperaerated, clear lungs.  Discharged with supplemental oxygen and Anoro Ellipta.   05/30/2018 Patient presents today for hosp follow-up, discharged home 6 days ago. Continues to have a cough, occasionally productive with mostly clear sputum color. States that he has not had much energy or appetite. Reports that his breathing is so-so. Discharge with no additional antibiotic or prednisone.Takes daily prednisone 2.5mg  every other day. He is on [employer], states that he is unable to get through [place]. Continues Asmanex. Also taking Stiolto at bedtime. It had been recommended that he try Trelegy but not available. Using nebulizer three times a day. Discharge summary not available for today's office visit. Getting physical therapy, documented O2 desaturation on 2L but quickly recovers.  06/07/2018 Patient returns today for 1 week follow-up. Accompanied by his wife. Reports that he is breathing better with less cough and mucus. Receiving physical therapy twice a week, tapering down. He has been to pulmonary rehab in the past but had to stop early. Still using oxygen during the day. O2 99% 2L today. He has more energy and appetite has improved. Completed additional prednisone taper. Wondering if he can stay on prednisone 5mg  daily. He was  taking 2.5mg  every other day. Will ambulate patient to assess oxygen need today.   Allergies  Allergen Reactions  . Sulfa Antibiotics   . Sulfonamide Derivatives     Unknown - as a child    Immunization History  Administered Date(s) Administered  . Influenza Split 04/30/2011, 04/02/2017  . Influenza Whole 03/21/2010, 03/21/2012  . Influenza, High Dose Seasonal PF 04/26/2016, 05/04/2018  . Influenza,inj,Quad PF,6+ Mos 03/12/2013, 03/21/2014, 04/07/2015  . Pneumococcal Conjugate-13 10/26/2013  . Pneumococcal Polysaccharide-23 03/21/2008  . Tdap 03/17/2014    Past Medical History:  Diagnosis Date  . Arthritis    osteoarthritis  . Cancer ([place])    skin cancer only.-no melanoma.  Marland Kitchen COPD (chronic obstructive pulmonary disease) ([place])   . History of kidney stones    lithotripsy x1   . History of oxygen administration    Oxygen @ 2 l/m nasally bedtime  . Hyperlipidemia   . Hypothyroidism   . Pulmonary nodule, right   . 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 Tobacco abuse    Quit 09/17/10    Tobacco History: Social History   Tobacco Use  Smoking Status Former Smoker  . Packs/day: 1.00  . Years: 55.00  . Pack years: 55.00  . Types: Cigarettes  . Last attempt to quit: 04/22/2011  . Years since quitting: 7.1  Smokeless Tobacco Never Used  Tobacco Comment   occasionally smoked 2-3 times a week--1 cig each time   Counseling given: Not Answered Comment: occasionally smoked 2-3 times a week--1 cig each time   Outpatient Medications Prior to Visit  Medication Sig Dispense Refill  . albuterol (PROVENTIL  HFA;VENTOLIN HFA) 108 (90 BASE) MCG/ACT inhaler Inhale 2 puffs into the lungs every 4 (four) hours as needed for wheezing or shortness of breath.    Marland Kitchen albuterol (PROVENTIL) (2.5 MG/3ML) 0.083% nebulizer solution USE 1 VIAL IN NEBULIZER EVERY 6 HOURS. Generic: VENTOLIN (Patient taking differently: Take 2.5 mg by nebulization every 6 (six) hours as needed. ) 120 vial  10  . ANORO ELLIPTA 62.5-25 MCG/INH AEPB Inhale 1 puff into the lungs daily. 60 each 0  . guaiFENesin (MUCINEX) 600 MG 12 hr tablet Take 2 tablets (1,200 mg total) by mouth 2 (two) times daily as needed.    Marland Kitchen HYDROcodone-acetaminophen (NORCO/VICODIN) 5-325 MG tablet Take 1-2 tablets by mouth every 6 (six) hours as needed. 10 tablet 0  . levothyroxine (SYNTHROID, LEVOTHROID) 50 MCG tablet Take 50 mcg by mouth daily before breakfast.     . mometasone (ASMANEX) 220 MCG/INH inhaler Inhale 2 puffs into the lungs daily.    . predniSONE (DELTASONE) 5 MG tablet TAKE 1 TABLET (5 MG TOTAL) BY MOUTH DAILY WITH BREAKFAST. (Patient taking differently: Take 5 mg by mouth every other day. ) 30 tablet 3  . simvastatin (ZOCOR) 5 MG tablet Take 5 mg by mouth at bedtime.      . triamcinolone cream (KENALOG) 0.1 % Apply 1 application topically 2 (two) times daily.     . predniSONE (DELTASONE) 10 MG tablet Take 4 tabs po daily x 2 days; then 3 tabs for 2 days; then 2 tabs for 2 days; then 1 tab for 2 days 20 tablet 0  . umeclidinium-vilanterol (ANORO ELLIPTA) 62.5-25 MCG/INH AEPB Inhale 1 puff into the lungs daily. 60 each 1   No facility-administered medications prior to visit.     Review of Systems  Review of Systems  Constitutional: Negative.   HENT: Negative.   Respiratory: Positive for cough. Negative for apnea, choking, chest tightness, shortness of breath, wheezing and stridor.   Cardiovascular: Negative.     Physical Exam  BP 130/66 (BP Location: Left Arm, Cuff Size: Normal)   Pulse 81   Temp 98 F (36.7 C)   Ht 5\' 9"  (1.753 m)   Wt 168 lb 3.2 oz (76.3 kg)   SpO2 99%   BMI 24.84 kg/m  Physical Exam Constitutional:      Appearance: Normal appearance. He is normal weight.  HENT:     Head: Normocephalic and atraumatic.     Nose: Nose normal.     Mouth/Throat:     Mouth: Mucous membranes are moist.     Pharynx: Oropharynx is clear.  Pulmonary:     Effort: Pulmonary effort is normal.      Breath sounds: Normal breath sounds.     Comments: CTA, mildly diminished  No resp distress or wheeze Skin:    General: Skin is warm and dry.     Comments: Dressing to forehead   Neurological:     General: No focal deficit present.     Mental Status: He is alert and oriented to person, place, and time. Mental status is at baseline.  Psychiatric:        Mood and Affect: Mood normal.        Behavior: Behavior normal.        Thought Content: Thought content normal.        Judgment: Judgment normal.      Lab Results:  CBC    Component Value Date/Time   WBC 14.3 (H) 05/22/2018 0342   RBC 3.96 (L)  05/22/2018 0342   HGB 11.7 (L) 05/22/2018 0342   HCT 36.6 (L) 05/22/2018 0342   PLT 216 05/22/2018 0342   MCV 92.4 05/22/2018 0342   MCH 29.5 05/22/2018 0342   MCHC 32.0 05/22/2018 0342   RDW 13.2 05/22/2018 0342   LYMPHSABS 2.3 05/20/2018 1109   MONOABS 0.9 05/20/2018 1109   EOSABS 0.1 05/20/2018 1109   BASOSABS 0.0 05/20/2018 1109    BMET    Component Value Date/Time   NA 136 05/22/2018 0342   K 4.4 05/22/2018 0342   CL 103 05/22/2018 0342   CO2 28 05/22/2018 0342   GLUCOSE 132 (H) 05/22/2018 0342   BUN 25 (H) 05/22/2018 0342   CREATININE 1.19 05/22/2018 0342   CALCIUM 8.1 (L) 05/22/2018 0342   GFRNONAA 54 (L) 05/22/2018 0342   GFRAA >60 05/22/2018 0342    BNP    Component Value Date/Time   BNP 27.0 05/20/2018 1109    ProBNP    Component Value Date/Time   PROBNP <30.0 12/08/2006 0115    Imaging: Dg Chest Port 1 View  Result Date: 05/20/2018 CLINICAL DATA:  Productive cough and short of breath for 2 days EXAM: PORTABLE CHEST 1 VIEW COMPARISON:  04/07/2015 FINDINGS: Lungs are hyperaerated and clear. Normal heart size. No pneumothorax. No pleural effusion. Linear markings along the left and right heart borders are felt to represent overlying vessels rather than an indication of mediastinal emphysema. IMPRESSION: No active disease. Electronically Signed   By:  Marybelle Killings M.D.   On: 05/20/2018 11:33     Assessment & Plan:   COPD GOLD IV Finish Anoro samples then resume Stiolto 2 puffs daily  Continue Asmanex 2 puffs daily  Stay on prednisone 5mg  daily Refer back to pulmonary rehab (did not complete) FU in 1-3 months with Dr. Halford Chessman   COPD with acute exacerbation [facility]) Resolved   Chronic respiratory failure with hypoxia (HCC) Amb O2 today - SpO2 low 95% RA after 250 ft. Stopped d/t fatigue Check pulse oximeter at home, use oxygen as needed 1-2 L to keep O2 >90%    Martyn Ehrich, NP 06/07/2018

## 2018-06-07 NOTE — Progress Notes (Signed)
Reviewed and agree with assessment/plan.   Tinya Cadogan, MD [place] [facility]/Critical [facility] 06/16/2016, 12:24 PM Pager:  [phone]  

## 2018-06-07 NOTE — Patient Instructions (Addendum)
  Check pulse oximeter at home Use oxygen as needed 1-2 L to keep O2 >90%   Continue Anoro 1 puff daily until complete; then resume Stiolto 2 puffs daily Continue Asmanex 2 puffs daily   Continue prednisone 5mg  daily until next office visit   Needs pulmonary rehab re: COPD gold IV  Follow-up with Dr. Halford Chessman in 1-3 months for regular visit

## 2018-06-07 NOTE — Telephone Encounter (Signed)
Attempted to call Clair Gulling [facility] PT.  Left detailed message about O2 recommendations from Geraldo Pitter, NP.   Per Geraldo Pitter, NP, 06/07/18- Check pulse oximeter at home Use oxygen as needed 1-2 L to keep O2 >90%

## 2018-06-07 NOTE — Assessment & Plan Note (Addendum)
Amb O2 today - SpO2 low 95% RA after 250 ft. Stopped d/t fatigue Check pulse oximeter at home, use oxygen as needed 1-2 L to keep O2 >90%

## 2018-06-08 NOTE — Telephone Encounter (Signed)
Lm for Jim with [facility].

## 2018-06-08 NOTE — Telephone Encounter (Signed)
Clair Gulling, [facility], returning call. Per Clair Gulling, he received the msg about O2 being PRN. Clair Gulling states that unless there is further questions from him, he does not need a call back. Cb is [phone].

## 2018-06-08 NOTE — Telephone Encounter (Signed)
[name] was correct in regards as the message he received with pt's O2 being prn. Nothing further needed.

## 2018-06-09 ENCOUNTER — Telehealth (HOSPITAL_COMMUNITY): Payer: Self-pay

## 2018-06-09 NOTE — Telephone Encounter (Signed)
Referral received from MD [facility] for Pulmonary Rehab with diagnosis of Centrilobular Emphysema. Clinical review of pt follow up appt on 06/07/18 Pulmonary office note with NP Volanda Napoleon.   Pt appropriate for scheduling for Pulmonary rehab.  Will forward to support staff for scheduling and verification of insurance eligibility/benefits with pt  consent. Joycelyn Man, RN, BSN Cardiac and Pulmonary Rehab Nurse

## 2018-06-15 ENCOUNTER — Telehealth: Payer: Self-pay | Admitting: Pulmonary Disease

## 2018-06-15 NOTE — Telephone Encounter (Signed)
Call made to Jim at [facility], per Clair Gulling he states they are cancelling this weeks visit per the patient request. He is requesting a verbal order to make up this visit next week.   TN Please advise if we can give verbal order to continue hh rehab next week. Thanks.

## 2018-06-15 NOTE — Telephone Encounter (Signed)
Left message with Clair Gulling RT with [facility], verbal order given to continue services next week. Informed Clair Gulling to call us back if anything else was needed. Nothing further is needed at this time.

## 2018-06-15 NOTE — Telephone Encounter (Signed)
Yes. Thanks 

## 2018-06-16 ENCOUNTER — Telehealth: Payer: Self-pay | Admitting: Pulmonary Disease

## 2018-06-16 DIAGNOSIS — J449 Chronic obstructive pulmonary disease, unspecified: Secondary | ICD-10-CM

## 2018-06-16 NOTE — Telephone Encounter (Signed)
Okay to send order. 

## 2018-06-16 NOTE — Telephone Encounter (Signed)
Dr. Halford Chessman are you okay with a referral for pulmonary  Rehab ?   Patient is going home out of rehab and is needing out pt rehab per Clair Gulling.  Clair Gulling is aware Dr.Sood is out until 06/26/17.

## 2018-06-16 NOTE — Telephone Encounter (Signed)
Order has been placed. Nothing further needed. 

## 2018-06-19 ENCOUNTER — Telehealth (HOSPITAL_COMMUNITY): Payer: Self-pay

## 2018-06-19 NOTE — Telephone Encounter (Signed)
Called patient to see if he was interested in participating in the Pulmonary Rehab Program. Patient stated yes. Patient will come in for orientation on 07/07/18 @ 130PM and will attend the 130PM exercise class.  Mailed homework package.  went over insurance, patient verbalized understanding.

## 2018-06-19 NOTE — Telephone Encounter (Signed)
Pt insurance is active and benefits verified through Medicare A/B. Co-pay $0.00, DED $185.00/$0.00 met, out of pocket $0.00/$0.00 met, co-insurance 20%. No pre-authorization required. 06/19/18  2ndary insurance is active and benefits verified through [place]. Co-pay $0.00, DED $0.00/$0.00 met, out of pocket $0.00/$0.00 met, co-insurance 0%. No pre-authorization required. 06/19/18

## 2018-07-04 ENCOUNTER — Telehealth (HOSPITAL_COMMUNITY): Payer: Self-pay

## 2018-07-07 ENCOUNTER — Encounter (HOSPITAL_COMMUNITY): Payer: Self-pay

## 2018-07-07 ENCOUNTER — Encounter (HOSPITAL_COMMUNITY)
Admission: RE | Admit: 2018-07-07 | Discharge: 2018-07-07 | Disposition: A | Discharge status: Home / Self Care | Payer: Medicare Other | Source: Ambulatory Visit | Attending: Pulmonary Disease | Admitting: Pulmonary Disease

## 2018-07-07 VITALS — BP 165/63 | HR 93 | Temp 97.7°F | Resp 22 | Ht 67.5 in | Wt 173.3 lb

## 2018-07-07 DIAGNOSIS — J432 Centrilobular emphysema: Secondary | ICD-10-CM | POA: Diagnosis not present

## 2018-07-07 NOTE — Progress Notes (Addendum)
[name] 85 y.o. male Pulmonary Rehab Orientation Note Pt referred to Pulmonary rehab by Dr. Halford Chessman with the diagnosis of centrilobular emphysema. Patient arrived today in Cardiac and Pulmonary Rehab for orientation to Pulmonary Rehab. He was transported from [employer] via wheel chair. He does not carry portable oxygen. Per pt, he uses oxygen at night while sleeping. Color good, skin warm and dry. Patient is oriented to time and place. Patient's medical history, psychosocial health, and medications reviewed. Psychosocial assessment reveals pt lives with their spouse. He has 4 children including one set of twins, but they do not live in the area. Pt is currently retired. Pt hobbies include reading World War II books and watching TV especially basketball. Pt also states that he loves to sail, but he is "getting too old for it." Pt reports his stress level is low. Pt does not exhibit signs of depression and pt reports no difficulty sleeping. PHQ2/9 score 0/0. Pt shows good  coping skills with positive outlook. Will continue to monitor and evaluate progress toward psychosocial goal(s) of maintaining a positive and healthy outlook. Physical assessment reveals heart rate is normal, breath sounds clear to auscultation, no wheezes, rales, or rhonchi. Grip strength equal, strong. Distal pulses +2. Patient reports he does take medications as prescribed. Patient states he follows a Regular diet. The patient reports no specific efforts to gain or lose weight. Pt would like to maintain weight while in rehab. Patient's weight will be monitored closely. Demonstration and practice of PLB using pulse oximeter. Patient able to return demonstration satisfactorily. Safety and hand hygiene in the exercise area reviewed with patient. Patient voices understanding of the information reviewed. Department expectations discussed with patient and achievable goals were set. The patient shows enthusiasm about attending the program  and we look forward to working with this nice gentleman. The patient is scheduled for a 6 min walk test on 07/11/18 at 1200 and to begin exercise on 07/18/18 with the 1:30 pm class.   [place] RN, BSN Cardiac and Pulmonary Rehab RN

## 2018-07-11 ENCOUNTER — Encounter (HOSPITAL_COMMUNITY)
Admission: RE | Admit: 2018-07-11 | Discharge: 2018-07-11 | Disposition: A | Discharge status: Home / Self Care | Payer: Medicare Other | Source: Ambulatory Visit | Attending: Pulmonary Disease | Admitting: Pulmonary Disease

## 2018-07-11 DIAGNOSIS — J432 Centrilobular emphysema: Secondary | ICD-10-CM

## 2018-07-11 NOTE — Progress Notes (Signed)
Pulmonary Individual Treatment Plan  Patient Details  Name: [name] MRN: [id number] Date of Birth: 01/03/1928 Referring Provider:     Pulmonary Rehab Walk Test from 07/11/2018 in [place]  Referring Provider  Dr. Halford Chessman      Initial Encounter Date:    Pulmonary Rehab Walk Test from 07/11/2018 in [place]  Date  07/11/18      Visit Diagnosis: Centrilobular emphysema ([place])  Patient's Home Medications on Admission:   Current Outpatient Medications:  .  albuterol (PROVENTIL HFA;VENTOLIN HFA) 108 (90 BASE) MCG/ACT inhaler, Inhale 2 puffs into the lungs every 4 (four) hours as needed for wheezing or shortness of breath., Disp: , Rfl:  .  albuterol (PROVENTIL) (2.5 MG/3ML) 0.083% nebulizer solution, USE 1 VIAL IN NEBULIZER EVERY 6 HOURS. Generic: VENTOLIN (Patient taking differently: Take 2.5 mg by nebulization 3 (three) times daily. ), Disp: 120 vial, Rfl: 10 .  guaiFENesin (MUCINEX) 600 MG 12 hr tablet, Take 2 tablets (1,200 mg total) by mouth 2 (two) times daily as needed. (Patient not taking: Reported on 07/07/2018), Disp: , Rfl:  .  HYDROcodone-acetaminophen (NORCO/VICODIN) 5-325 MG tablet, Take 1-2 tablets by mouth every 6 (six) hours as needed. (Patient not taking: Reported on 07/07/2018), Disp: 10 tablet, Rfl: 0 .  levothyroxine (SYNTHROID, LEVOTHROID) 50 MCG tablet, Take 50 mcg by mouth daily before breakfast. , Disp: , Rfl:  .  mometasone (ASMANEX) 220 MCG/INH inhaler, Inhale 2 puffs into the lungs daily., Disp: , Rfl:  .  predniSONE (DELTASONE) 5 MG tablet, TAKE 1 TABLET (5 MG TOTAL) BY MOUTH DAILY WITH BREAKFAST., Disp: 30 tablet, Rfl: 3 .  simvastatin (ZOCOR) 5 MG tablet, Take 5 mg by mouth at bedtime.  , Disp: , Rfl:  .  Tiotropium Bromide-Olodaterol (STIOLTO RESPIMAT) 2.5-2.5 MCG/ACT AERS, Inhale 2 puffs into the lungs daily., Disp: 1 Inhaler, Rfl: 6 .  triamcinolone cream (KENALOG) 0.1 %, Apply 1 application  topically 2 (two) times daily. , Disp: , Rfl:   Past Medical History: Past Medical History:  Diagnosis Date  . Arthritis    osteoarthritis  . Cancer ([place])    skin cancer only.-no melanoma.  Marland Kitchen COPD (chronic obstructive pulmonary disease) ([place])   . History of kidney stones    lithotripsy x1   . History of oxygen administration    Oxygen @ 2 l/m nasally bedtime  . Hyperlipidemia   . Hypothyroidism   . Pulmonary nodule, right   . 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 Tobacco abuse    Quit 09/17/10    Tobacco Use: Social History   Tobacco Use  Smoking Status Former Smoker  . Packs/day: 1.00  . Years: 55.00  . Pack years: 55.00  . Types: Cigarettes  . Last attempt to quit: 04/22/2011  . Years since quitting: 7.2  Smokeless Tobacco Never Used  Tobacco Comment   occasionally smoked 2-3 times a week--1 cig each time    Labs: Recent Review Flowsheet Data    Labs for ITP Cardiac and Pulmonary Rehab Latest Ref Rng & Units 12/07/2006 12/08/2006 12/11/2006 06/03/2014 05/20/2018   Cholestrol - - 114 ATP III CLASSIFICATION: <200     mg/dL   Desirable 200-239  mg/dL   Borderline High >=240    mg/dL   High - - -   LDLCALC - - 75 Total Cholesterol/HDL:CHD Risk Coronary Heart Disease Risk Table Men   Women 1/2 Average Risk   3.4  3.3 - - -   HDL - - 29(L) - - -   Trlycerides - - 48 - - -   Hemoglobin A1c <5.7 % - - - 6.5(H) -   PHART 7.350 - 7.450 - 7.433 7.430 - 7.371   PCO2ART 32.0 - 48.0 mmHg - 32.9(L) 38.0 - 43.3   HCO3 20.0 - 28.0 mmol/L 21.1 21.6 24.8(H) - 25.0   TCO2 22 - 32 mmol/L 22 22.6 25.9 - 26   ACIDBASEDEF - 2.0 2.0 - - -   O2SAT % - 93.8 94.5 - 97.0      Capillary Blood Glucose: Lab Results  Component Value Date   GLUCAP 103 (H) 05/23/2018     Pulmonary Assessment Scores: Pulmonary Assessment Scores    Row Name 07/07/18 1416         ADL UCSD   ADL Phase  Entry     SOB Score total  62       CAT Score   CAT Score  16         Pulmonary Function Assessment:   Exercise Target Goals: Exercise Program Goal: Individual exercise prescription set using results from initial 6 min walk test and THRR while considering  patient's activity barriers and safety.   Exercise Prescription Goal: Initial exercise prescription builds to 30-45 minutes a day of aerobic activity, 2-3 days per week.  Home exercise guidelines will be given to patient during program as part of exercise prescription that the participant will acknowledge.  Activity Barriers & Risk Stratification: Activity Barriers & Cardiac Risk Stratification - 07/07/18 1407      Activity Barriers & Cardiac Risk Stratification   Activity Barriers  Left Hip Replacement;Arthritis;Shortness of Breath       6 Minute Walk: 6 Minute Walk    Row Name 07/11/18 1341         6 Minute Walk   Phase  Initial     Distance  800 feet     Walk Time  4.75 minutes     # of Rest Breaks  2 both seated. 1 foe 30 sec, 1 for 45 sec     MPH  1.51     METS  2.15     RPE  13     Perceived Dyspnea   3     Symptoms  No     Resting HR  76 bpm     Resting BP  132/64     Resting Oxygen Saturation   99 %     Exercise Oxygen Saturation  during 6 min walk  94 %     Max Ex. HR  108 bpm     Max Ex. BP  152/72     2 Minute Post BP  140/74       Interval HR   1 Minute HR  97     2 Minute HR  107     3 Minute HR  103     4 Minute HR  106     5 Minute HR  107     6 Minute HR  108     2 Minute Post HR  91     Interval Heart Rate?  Yes       Interval Oxygen   Interval Oxygen?  Yes     Baseline Oxygen Saturation %  99 %     1 Minute Oxygen Saturation %  95 %     1 Minute Liters of Oxygen  0 L     2 Minute Oxygen Saturation %  96 %     2 Minute Liters of Oxygen  0 L     3 Minute Oxygen Saturation %  95 %     3 Minute Liters of Oxygen  0 L     4 Minute Oxygen Saturation %  96 %     4 Minute Liters of Oxygen  0 L     5 Minute Oxygen Saturation %  95 %     5 Minute Liters of  Oxygen  0 L     6 Minute Oxygen Saturation %  94 %     6 Minute Liters of Oxygen  0 L     2 Minute Post Oxygen Saturation %  99 %     2 Minute Post Liters of Oxygen  0 L        Oxygen Initial Assessment: Oxygen Initial Assessment - 07/11/18 1340      Home Oxygen   Home Oxygen Device  Home Concentrator;Portable Concentrator    Sleep Oxygen Prescription  Continuous    Liters per minute  2    Home Exercise Oxygen Prescription  None    Home at Rest Exercise Oxygen Prescription  None    Compliance with Home Oxygen Use  Yes      Initial 6 min Walk   Oxygen Used  None      Program Oxygen Prescription   Program Oxygen Prescription  None      Intervention   Short Term Goals  To learn and exhibit compliance with exercise, home and travel O2 prescription;To learn and understand importance of monitoring SPO2 with pulse oximeter and demonstrate accurate use of the pulse oximeter.;To learn and demonstrate proper pursed lip breathing techniques or other breathing techniques.;To learn and understand importance of maintaining oxygen saturations>88%;To learn and demonstrate proper use of respiratory medications    Long  Term Goals  Exhibits compliance with exercise, home and travel O2 prescription;Verbalizes importance of monitoring SPO2 with pulse oximeter and return demonstration;Maintenance of O2 saturations>88%;Exhibits proper breathing techniques, such as pursed lip breathing or other method taught during program session;Compliance with respiratory medication;Demonstrates proper use of MDI's       Oxygen Re-Evaluation:   Oxygen Discharge (Final Oxygen Re-Evaluation):   Initial Exercise Prescription: Initial Exercise Prescription - 07/11/18 1300      Date of Initial Exercise RX and Referring Provider   Date  07/11/18    Referring Provider  Dr. Halford Chessman      Recumbant Bike   Level  1    Minutes  17      NuStep   Level  2    SPM  80    Minutes  17      Track   Laps  9    Minutes   17      Prescription Details   Frequency (times per week)  2    Duration  Progress to 45 minutes of aerobic exercise without signs/symptoms of physical distress      Intensity   THRR 40-80% of Max Heartrate  53-106    Ratings of Perceived Exertion  11-13    Perceived Dyspnea  0-4      Progression   Progression  Continue to progress workloads to maintain intensity without signs/symptoms of physical distress.      Resistance Training   Training Prescription  Yes    Weight  orange bands    Reps  10-15       Perform Capillary Blood Glucose checks as needed.  Exercise Prescription Changes:   Exercise Comments:   Exercise Goals and Review: Exercise Goals    Row Name 07/07/18 1408             Exercise Goals   Increase Physical Activity  Yes       Intervention  Provide advice, education, support and counseling about physical activity/exercise needs.;Develop an individualized exercise prescription for aerobic and resistive training based on initial evaluation findings, risk stratification, comorbidities and participant's personal goals.       Expected Outcomes  Short Term: Attend rehab on a regular basis to increase amount of physical activity.       Increase Strength and Stamina  Yes       Intervention  Provide advice, education, support and counseling about physical activity/exercise needs.;Develop an individualized exercise prescription for aerobic and resistive training based on initial evaluation findings, risk stratification, comorbidities and participant's personal goals.       Expected Outcomes  Short Term: Increase workloads from initial exercise prescription for resistance, speed, and METs.       Able to understand and use rate of perceived exertion (RPE) scale  Yes       Intervention  Provide education and explanation on how to use RPE scale       Expected Outcomes  Short Term: Able to use RPE daily in rehab to express subjective intensity level;Long Term:  Able to use  RPE to guide intensity level when exercising independently       Able to understand and use Dyspnea scale  Yes       Intervention  Provide education and explanation on how to use Dyspnea scale       Expected Outcomes  Short Term: Able to use Dyspnea scale daily in rehab to express subjective sense of shortness of breath during exertion;Long Term: Able to use Dyspnea scale to guide intensity level when exercising independently       Knowledge and understanding of Target Heart Rate Range (THRR)  Yes       Intervention  Provide education and explanation of THRR including how the numbers were predicted and where they are located for reference       Expected Outcomes  Short Term: Able to state/look up THRR;Long Term: Able to use THRR to govern intensity when exercising independently;Short Term: Able to use daily as guideline for intensity in rehab       Understanding of Exercise Prescription  Yes       Intervention  Provide education, explanation, and written materials on patient's individual exercise prescription       Expected Outcomes  Short Term: Able to explain program exercise prescription;Long Term: Able to explain home exercise prescription to exercise independently          Exercise Goals Re-Evaluation :   Discharge Exercise Prescription (Final Exercise Prescription Changes):   Nutrition:  Target Goals: Understanding of nutrition guidelines, daily intake of sodium 1500mg , cholesterol 200mg , calories 30% from fat and 7% or less from saturated fats, daily to have 5 or more servings of fruits and vegetables.  Biometrics: Pre Biometrics - 07/07/18 1410      Pre Biometrics   Grip Strength  35 kg        Nutrition Therapy Plan and Nutrition Goals:   Nutrition Assessments:   Nutrition Goals Re-Evaluation:   Nutrition Goals Discharge (Final Nutrition Goals Re-Evaluation):   Psychosocial: Target Goals:  Acknowledge presence or absence of significant depression and/or stress,  maximize coping skills, provide positive support system. Participant is able to verbalize types and ability to use techniques and skills needed for reducing stress and depression.  Initial Review & Psychosocial Screening: Initial Psych Review & Screening - 07/07/18 1416      Initial Review   Current issues with  None Identified      Family Dynamics   Good Support System?  Yes      Barriers   Psychosocial barriers to participate in program  There are no identifiable barriers or psychosocial needs.      Screening Interventions   Interventions  Encouraged to exercise    Expected Outcomes  Short Term goal: Identification and review with participant of any Quality of Life or Depression concerns found by scoring the questionnaire.;Long Term goal: The participant improves quality of Life and PHQ9 Scores as seen by post scores and/or verbalization of changes       Quality of Life Scores:  Scores of 19 and below usually indicate a poorer quality of life in these areas.  A difference of  2-3 points is a clinically meaningful difference.  A difference of 2-3 points in the total score of the Quality of Life Index has been associated with significant improvement in overall quality of life, self-image, physical symptoms, and general health in studies assessing change in quality of life.   PHQ-9: Recent Review Flowsheet Data    Depression screen [facility] 2/9 07/07/2018 10/03/2017 01/05/2017   Decreased Interest 0 0 0   Down, Depressed, Hopeless 0 0 0   PHQ - 2 Score 0 0 0   Altered sleeping 0 - -   Tired, decreased energy 0 - -   Change in appetite 0 - -   Feeling bad or failure about yourself  0 - -   Trouble concentrating 0 - -   Moving slowly or fidgety/restless 0 - -   Suicidal thoughts 0 - -   PHQ-9 Score 0 - -   Difficult doing work/chores Not difficult at all - -     Interpretation of Total Score  Total Score Depression Severity:  1-4 = Minimal depression, 5-9 = Mild depression, 10-14 =  Moderate depression, 15-19 = Moderately severe depression, 20-27 = Severe depression   Psychosocial Evaluation and Intervention: Psychosocial Evaluation - 07/07/18 1521      Psychosocial Evaluation & Interventions   Interventions  Stress management education;Relaxation education;Encouraged to exercise with the program and follow exercise prescription    Comments  Pt states he has no stressors in his life. He has a wife, who he states will be starting cardiac rehab later this month, who is a good support system for him a well as 4 children. Children do not live in the area, but he maintains contact.    Expected Outcomes  Pt will continue to have a positive and healthy outlook    Continue Psychosocial Services   Follow up required by staff       Psychosocial Re-Evaluation:   Psychosocial Discharge (Final Psychosocial Re-Evaluation):    Education: Education Goals: Education classes will be provided on a weekly basis, covering required topics. Participant will state understanding/return demonstration of topics presented.  Learning Barriers/Preferences: Learning Barriers/Preferences - 07/07/18 1420      Learning Barriers/Preferences   Learning Barriers  Sight    Learning Preferences  Verbal Instruction;Skilled Demonstration;Individual Instruction;Group Instruction;Computer/Internet       Education Topics: How Lungs Work and Diseases: -  Discuss the anatomy of the lungs and diseases that can affect the lungs, such as COPD.   Exercise: -Discuss the importance of exercise, FITT principles of exercise, normal and abnormal responses to exercise, and how to exercise safely.   Environmental Irritants: -Discuss types of environmental irritants and how to limit exposure to environmental irritants.   Meds/Inhalers and oxygen: - Discuss respiratory medications, definition of an inhaler and oxygen, and the proper way to use an inhaler and oxygen.   Energy Saving Techniques: - Discuss  methods to conserve energy and decrease shortness of breath when performing activities of daily living.    Bronchial Hygiene / Breathing Techniques: - Discuss breathing mechanics, pursed-lip breathing technique,  proper posture, effective ways to clear airways, and other functional breathing techniques   Cleaning Equipment: - Provides group verbal and written instruction about the health risks of elevated stress, cause of high stress, and healthy ways to reduce stress.   Nutrition I: Fats: - Discuss the types of cholesterol, what cholesterol does to the body, and how cholesterol levels can be controlled.   Nutrition II: Labels: -Discuss the different components of food labels and how to read food labels.   Respiratory Infections: - Discuss the signs and symptoms of respiratory infections, ways to prevent respiratory infections, and the importance of seeking medical treatment when having a respiratory infection.   Stress I: Signs and Symptoms: - Discuss the causes of stress, how stress may lead to anxiety and depression, and ways to limit stress.   Stress II: Relaxation: -Discuss relaxation techniques to limit stress.   Oxygen for Home/Travel: - Discuss how to prepare for travel when on oxygen and proper ways to transport and store oxygen to ensure safety.   Knowledge Questionnaire Score: Knowledge Questionnaire Score - 07/07/18 1421      Knowledge Questionnaire Score   Pre Score  18/18       Core Components/Risk Factors/Patient Goals at Admission: Personal Goals and Risk Factors at Admission - 07/07/18 1421      Core Components/Risk Factors/Patient Goals on Admission    Weight Management  Weight Maintenance    Improve shortness of breath with ADL's  Yes    Intervention  Provide education, individualized exercise plan and daily activity instruction to help decrease symptoms of SOB with activities of daily living.    Expected Outcomes  Short Term: Improve  cardiorespiratory fitness to achieve a reduction of symptoms when performing ADLs;Long Term: Be able to perform more ADLs without symptoms or delay the onset of symptoms    Hypertension  Yes    Intervention  Provide education on lifestyle modifcations including regular physical activity/exercise, weight management, moderate sodium restriction and increased consumption of fresh fruit, vegetables, and low fat dairy, alcohol moderation, and smoking cessation.;Monitor prescription use compliance.    Expected Outcomes  Short Term: Continued assessment and intervention until BP is < 140/59mm HG in hypertensive participants. < 130/46mm HG in hypertensive participants with diabetes, heart failure or chronic kidney disease.;Long Term: Maintenance of blood pressure at goal levels.    Lipids  Yes    Intervention  Provide education and support for participant on nutrition & aerobic/resistive exercise along with prescribed medications to achieve LDL 70mg , HDL >40mg .    Expected Outcomes  Short Term: Participant states understanding of desired cholesterol values and is compliant with medications prescribed. Participant is following exercise prescription and nutrition guidelines.;Long Term: Cholesterol controlled with medications as prescribed, with individualized exercise RX and with personalized nutrition plan. Value goals: LDL <  70mg , HDL > 40 mg.       Core Components/Risk Factors/Patient Goals Review:  Goals and Risk Factor Review    Row Name 07/07/18 1422             Core Components/Risk Factors/Patient Goals Review   Personal Goals Review  Improve shortness of breath with ADL's;Increase knowledge of respiratory medications and ability to use respiratory devices properly.;Develop more efficient breathing techniques such as purse lipped breathing and diaphragmatic breathing and practicing self-pacing with activity.;Hypertension          Core Components/Risk Factors/Patient Goals at Discharge (Final  Review):  Goals and Risk Factor Review - 07/07/18 1422      Core Components/Risk Factors/Patient Goals Review   Personal Goals Review  Improve shortness of breath with ADL's;Increase knowledge of respiratory medications and ability to use respiratory devices properly.;Develop more efficient breathing techniques such as purse lipped breathing and diaphragmatic breathing and practicing self-pacing with activity.;Hypertension       ITP Comments: ITP Comments    Row Name 07/07/18 1350           ITP Comments  Dr. Jennet Maduro, Medical Director, Pulmonary Rehab          Comments:

## 2018-07-13 NOTE — Progress Notes (Signed)
[name] 85 y.o. male  DOB: 1928-05-19 MRN: [id number]           Nutrition Note 1. Centrilobular emphysema ([place])    Past Medical History:  Diagnosis Date  . Arthritis    osteoarthritis  . Cancer ([place])    skin cancer only.-no melanoma.  Marland Kitchen COPD (chronic obstructive pulmonary disease) ([place])   . History of kidney stones    lithotripsy x1   . History of oxygen administration    Oxygen @ 2 l/m nasally bedtime  . Hyperlipidemia   . Hypothyroidism   . Pulmonary nodule, right   . 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 Tobacco abuse    Quit 09/17/10   Meds reviewed.   Current Outpatient Medications (Endocrine & Metabolic):  .  levothyroxine (SYNTHROID, LEVOTHROID) 50 MCG tablet, Take 50 mcg by mouth daily before breakfast.  .  predniSONE (DELTASONE) 5 MG tablet, TAKE 1 TABLET (5 MG TOTAL) BY MOUTH DAILY WITH BREAKFAST.  Current Outpatient Medications (Cardiovascular):  .  simvastatin (ZOCOR) 5 MG tablet, Take 5 mg by mouth at bedtime.    Current Outpatient Medications (Respiratory):  .  albuterol (PROVENTIL HFA;VENTOLIN HFA) 108 (90 BASE) MCG/ACT inhaler, Inhale 2 puffs into the lungs every 4 (four) hours as needed for wheezing or shortness of breath. Marland Kitchen  albuterol (PROVENTIL) (2.5 MG/3ML) 0.083% nebulizer solution, USE 1 VIAL IN NEBULIZER EVERY 6 HOURS. Generic: VENTOLIN (Patient taking differently: Take 2.5 mg by nebulization 3 (three) times daily. ) .  mometasone (ASMANEX) 220 MCG/INH inhaler, Inhale 2 puffs into the lungs daily. .  Tiotropium Bromide-Olodaterol (STIOLTO RESPIMAT) 2.5-2.5 MCG/ACT AERS, Inhale 2 puffs into the lungs daily. Marland Kitchen  guaiFENesin (MUCINEX) 600 MG 12 hr tablet, Take 2 tablets (1,200 mg total) by mouth 2 (two) times daily as needed. (Patient not taking: Reported on 07/07/2018)  Current Outpatient Medications (Analgesics):  .  HYDROcodone-acetaminophen (NORCO/VICODIN) 5-325 MG tablet, Take 1-2 tablets by mouth every 6 (six) hours as needed.  (Patient not taking: Reported on 07/07/2018)   Current Outpatient Medications (Other):  .  triamcinolone cream (KENALOG) 0.1 %, Apply 1 application topically 2 (two) times daily.    Ht: Ht Readings from Last 1 Encounters:  07/07/18 5' 7.5" (1.715 m)     Wt:  Wt Readings from Last 6 Encounters:  07/07/18 173 lb 4.5 oz (78.6 kg)  06/07/18 168 lb 3.2 oz (76.3 kg)  05/30/18 167 lb (75.8 kg)  05/24/18 170 lb 10.2 oz (77.4 kg)  03/27/18 174 lb 6.4 oz (79.1 kg)  02/04/18 176 lb (79.8 kg)     BMI: Body mass index is 26.74 kg/m.    Current tobacco use? No    Labs:  Lipid Panel     Component Value Date/Time   CHOL  12/08/2006 0535    114        ATP III CLASSIFICATION:  <200     mg/dL   Desirable  200-239  mg/dL   Borderline High  >=240    mg/dL   High   TRIG 48 12/08/2006 0535   HDL 29 (L) 12/08/2006 0535   CHOLHDL 3.9 12/08/2006 0535   VLDL 10 12/08/2006 0535   LDLCALC  12/08/2006 0535    75        Total Cholesterol/HDL:CHD Risk Coronary Heart Disease Risk Table                     Men   Women  1/2  Average Risk   3.4   3.3    Lab Results  Component Value Date   HGBA1C 6.5 (H) 06/03/2014    Nutrition Diagnosis  ? Food-and nutrition-related knowledge deficit related to lack of exposure to information as related to diagnosis of pulmonary disease   Goal(s) 1. To be determined   Plan:  Pt to attend Pulmonary Nutrition class Will provide client-centered nutrition education as part of interdisciplinary care.    Monitor and Evaluate progress toward nutrition goal with team.   Laurina Bustle, MS, RD, LDN 07/13/2018 3:16 PM

## 2018-07-18 ENCOUNTER — Encounter (HOSPITAL_COMMUNITY)
Admission: RE | Admit: 2018-07-18 | Discharge: 2018-07-18 | Disposition: A | Discharge status: Home / Self Care | Payer: Medicare Other | Source: Ambulatory Visit | Attending: Pulmonary Disease | Admitting: Pulmonary Disease

## 2018-07-18 DIAGNOSIS — J432 Centrilobular emphysema: Secondary | ICD-10-CM

## 2018-07-18 NOTE — Progress Notes (Signed)
Daily Session Note  Patient Details  Name: [name] MRN: [id number] Date of Birth: 11/03/1927 Referring Provider:     Pulmonary Rehab Walk Test from 07/11/2018 in [facility]  Referring Provider  Dr. Sood      Encounter Date: 07/18/2018  Check In: Session Check In - 07/18/18 1519      Check-In   Supervising physician immediately available to respond to emergencies  [facility] immediately available    Physician(s)  Dr. Netty    Location  [facility]    Staff Present  Lindsay Agnew RN, BSN;Joan Behrens, RN, BSN;Dalton Fletcher, MS, Exercise Physiologist;Molly DiVincenzo, MS, ACSM RCEP, Exercise Physiologist    Medication changes reported      No    Fall or balance concerns reported     No    Tobacco Cessation  No Change    Warm-up and Cool-down  Performed as group-led instruction    Resistance Training Performed  Yes    VAD Patient?  No    PAD/SET Patient?  No      Pain Assessment   Currently in Pain?  No/denies    Pain Score  0-No pain    Multiple Pain Sites  No       Capillary Blood Glucose: No results found for this or any previous visit (from the past 24 hour(s)).    Social History   Tobacco Use  Smoking Status Former Smoker  . Packs/day: 1.00  . Years: 55.00  . Pack years: 55.00  . Types: Cigarettes  . Last attempt to quit: 04/22/2011  . Years since quitting: 7.2  Smokeless Tobacco Never Used  Tobacco Comment   occasionally smoked 2-3 times a week--1 cig each time    Goals Met:  Exercise tolerated well  Goals Unmet:  Not Applicable  Comments: Service time is from 1:30p to 3:00p    Dr. Wesam G. Yacoub is Medical Director for Pulmonary Rehab at [facility].   

## 2018-07-20 ENCOUNTER — Encounter (HOSPITAL_COMMUNITY)
Admission: RE | Admit: 2018-07-20 | Discharge: 2018-07-20 | Disposition: A | Discharge status: Home / Self Care | Payer: Medicare Other | Source: Ambulatory Visit | Attending: Pulmonary Disease | Admitting: Pulmonary Disease

## 2018-07-20 VITALS — Wt 175.0 lb

## 2018-07-20 DIAGNOSIS — J432 Centrilobular emphysema: Secondary | ICD-10-CM | POA: Diagnosis not present

## 2018-07-20 NOTE — Progress Notes (Signed)
Daily Session Note  Patient Details  Name: [name] MRN: [id number] Date of Birth: [birth year] Referring Provider:     Pulmonary Rehab Walk Test from 07/11/2018 in [place]  Referring Provider  Dr. Halford Chessman      Encounter Date: 07/20/2018  Check In: Session Check In - 07/20/18 1327      Check-In   Supervising physician immediately available to respond to emergencies  [facility] immediately available    Physician(s)  Dr. Waldron Labs    Location  [facility]    Staff Present  Joycelyn Man RN, BSN;Dalton Kris Mouton, MS, Exercise Physiologist;Molly DiVincenzo, MS, ACSM RCEP, Exercise Physiologist;Christoper Bushey Ysidro Evert, RN;Carlette Carlton, RN, BSN    Medication changes reported      No    Fall or balance concerns reported     No    Tobacco Cessation  No Change    Warm-up and Cool-down  Performed as group-led instruction    Resistance Training Performed  Yes    VAD Patient?  No    PAD/SET Patient?  No      Pain Assessment   Currently in Pain?  No/denies    Pain Score  0-No pain    Multiple Pain Sites  No       Capillary Blood Glucose: No results found for this or any previous visit (from the past 24 hour(s)).    Social History   Tobacco Use  Smoking Status Former Smoker  . Packs/day: 1.00  . Years: 55.00  . Pack years: 55.00  . Types: Cigarettes  . Last attempt to quit: 04/22/2011  . Years since quitting: 7.2  Smokeless Tobacco Never Used  Tobacco Comment   occasionally smoked 2-3 times a week--1 cig each time    Goals Met:  Exercise tolerated well No report of cardiac concerns or symptoms Strength training completed today  Goals Unmet:  Not Applicable  Comments: Service time is from 1330 to 1530    Dr. Rush Farmer is Medical Director for Pulmonary Rehab at [facility].

## 2018-07-25 ENCOUNTER — Encounter (HOSPITAL_COMMUNITY)
Admission: RE | Admit: 2018-07-25 | Discharge: 2018-07-25 | Disposition: A | Discharge status: Home / Self Care | Payer: Medicare Other | Source: Ambulatory Visit | Attending: Pulmonary Disease | Admitting: Pulmonary Disease

## 2018-07-25 VITALS — Ht 67.52 in | Wt 175.0 lb

## 2018-07-25 DIAGNOSIS — J432 Centrilobular emphysema: Secondary | ICD-10-CM | POA: Diagnosis not present

## 2018-07-25 NOTE — Progress Notes (Signed)
[name] 85 y.o. male   DOB: 1927-11-06 MRN: [id number]          Nutrition 1. Centrilobular emphysema ([place])    Past Medical History:  Diagnosis Date  . Arthritis    osteoarthritis  . Cancer ([place])    skin cancer only.-no melanoma.  Marland Kitchen COPD (chronic obstructive pulmonary disease) ([place])   . History of kidney stones    lithotripsy x1   . History of oxygen administration    Oxygen @ 2 l/m nasally bedtime  . Hyperlipidemia   . Hypothyroidism   . Pulmonary nodule, right   . 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 Tobacco abuse    Quit 09/17/10     Meds reviewed.  Current Outpatient Medications (Endocrine & Metabolic):  .  levothyroxine (SYNTHROID, LEVOTHROID) 50 MCG tablet, Take 50 mcg by mouth daily before breakfast.  .  predniSONE (DELTASONE) 5 MG tablet, TAKE 1 TABLET (5 MG TOTAL) BY MOUTH DAILY WITH BREAKFAST.  Current Outpatient Medications (Cardiovascular):  .  simvastatin (ZOCOR) 5 MG tablet, Take 5 mg by mouth at bedtime.    Current Outpatient Medications (Respiratory):  .  albuterol (PROVENTIL HFA;VENTOLIN HFA) 108 (90 BASE) MCG/ACT inhaler, Inhale 2 puffs into the lungs every 4 (four) hours as needed for wheezing or shortness of breath. Marland Kitchen  albuterol (PROVENTIL) (2.5 MG/3ML) 0.083% nebulizer solution, USE 1 VIAL IN NEBULIZER EVERY 6 HOURS. Generic: VENTOLIN (Patient taking differently: Take 2.5 mg by nebulization 3 (three) times daily. ) .  guaiFENesin (MUCINEX) 600 MG 12 hr tablet, Take 2 tablets (1,200 mg total) by mouth 2 (two) times daily as needed. (Patient not taking: Reported on 07/07/2018) .  mometasone (ASMANEX) 220 MCG/INH inhaler, Inhale 2 puffs into the lungs daily. .  Tiotropium Bromide-Olodaterol (STIOLTO RESPIMAT) 2.5-2.5 MCG/ACT AERS, Inhale 2 puffs into the lungs daily.  Current Outpatient Medications (Analgesics):  .  HYDROcodone-acetaminophen (NORCO/VICODIN) 5-325 MG tablet, Take 1-2 tablets by mouth every 6 (six) hours as needed.  (Patient not taking: Reported on 07/07/2018)   Current Outpatient Medications (Other):  .  triamcinolone cream (KENALOG) 0.1 %, Apply 1 application topically 2 (two) times daily.   Ht: Ht Readings from Last 1 Encounters:  07/25/18 5' 7.52" (1.715 m)     Wt:  Wt Readings from Last 3 Encounters:  07/25/18 175 lb 0.7 oz (79.4 kg)  07/07/18 173 lb 4.5 oz (78.6 kg)  06/07/18 168 lb 3.2 oz (76.3 kg)     BMI: Body mass index is 27 kg/m.     Current tobacco use? No  Labs:  Lipid Panel     Component Value Date/Time   CHOL  12/08/2006 0535    114        ATP III CLASSIFICATION:  <200     mg/dL   Desirable  200-239  mg/dL   Borderline High  >=240    mg/dL   High   TRIG 48 12/08/2006 0535   HDL 29 (L) 12/08/2006 0535   CHOLHDL 3.9 12/08/2006 0535   VLDL 10 12/08/2006 0535   LDLCALC  12/08/2006 0535    75        Total Cholesterol/HDL:CHD Risk Coronary Heart Disease Risk Table                     Men   Women  1/2 Average Risk   3.4   3.3    Lab Results  Component Value Date   HGBA1C 6.5 (H)  06/03/2014   Note Spoke with pt. Pt is within a normal weight range. Pt eats 3 meals a day; most prepared at home. He is making healthy food choices the majority of the time. Pt's primary goal is weight maintenance at this time. Reports losing ~10 lbs, and he has been working on gaining it back. Discussed eating more frequently across the day, and choosing higher protein higher calories foods to help increase weight. Pt's Rate Your Plate results reviewed with pt. Pt does avoid salty food; uses canned/ convenience food if lower in sodium. Pt does not add salt to food. The role of sodium in lung disease reviewed with pt. Pt expressed understanding of the information reviewed.  Nutrition Diagnosis ? Food-and nutrition-related knowledge deficit related to lack of exposure to information as related to diagnosis of pulmonary disease  Nutrition Intervention ? Pt's individual nutrition plan and  goals reviewed with pt. ? Benefits of adopting healthy eating habits discussed when pt's Rate Your Plate reviewed.  Goal(s) 1. Pt to identify and consume food sources higher in protein and higher in calories 2. The pt will recognize symptoms that can interfere with adequate oral intake, such as shortness of breath, N/V, early satiety, fatigue, ability to secure and prepare food, taste and smell changes, chewing/swallowing difficulties, and/ or pain when eating. 3. The pt will consume high-energy, high-nutrient dense beverages when necessary to compensate for decreased oral intake of solid foods.  Plan:  Pt to attend Pulmonary Nutrition class Will provide client-centered nutrition education as part of interdisciplinary care.    Monitor and Evaluate progress toward nutrition goal with team.   Laurina Bustle, MS, RD, LDN 07/25/2018 3:05 PM

## 2018-07-25 NOTE — Progress Notes (Signed)
Daily Session Note  Patient Details  Name: [name] MRN: [id number] Date of Birth: [birth year] Referring Provider:     Pulmonary Rehab Walk Test from 07/11/2018 in [place]  Referring Provider  Dr. Halford Chessman      Encounter Date: 07/20/2018  Check In:   Capillary Blood Glucose: No results found for this or any previous visit (from the past 24 hour(s)).  Exercise Prescription Changes - 07/25/18 1500      Response to Exercise   Blood Pressure (Admit)  136/74    Blood Pressure (Exercise)  150/74    Blood Pressure (Exit)  106/70    Heart Rate (Admit)  87 bpm    Heart Rate (Exercise)  108 bpm    Heart Rate (Exit)  97 bpm    Oxygen Saturation (Admit)  99 %    Oxygen Saturation (Exercise)  95 %    Oxygen Saturation (Exit)  98 %    Rating of Perceived Exertion (Exercise)  14    Perceived Dyspnea (Exercise)  4    Duration  Progress to 45 minutes of aerobic exercise without signs/symptoms of physical distress    Intensity  --   40-80% HRR     Progression   Progression  Continue to progress workloads to maintain intensity without signs/symptoms of physical distress.      Resistance Training   Training Prescription  Yes    Weight  orange bands    Reps  10-15    Time  10 Minutes      Interval Training   Interval Training  No      Recumbant Bike   Level  1    Minutes  17      NuStep   Level  2    SPM  80    Minutes  17    METs  2.4      Track   Laps  9    Minutes  17       Social History   Tobacco Use  Smoking Status Former Smoker  . Packs/day: 1.00  . Years: 55.00  . Pack years: 55.00  . Types: Cigarettes  . Last attempt to quit: 04/22/2011  . Years since quitting: 7.2  Smokeless Tobacco Never Used  Tobacco Comment   occasionally smoked 2-3 times a week--1 cig each time    Goals Met:  Proper associated with RPD/PD & O2 Sat Exercise tolerated well Strength training completed today  Goals Unmet:  Not  Applicable  Comments: Service time is from 1330 to 1500    Dr. Rush Farmer is Medical Director for Pulmonary Rehab at [facility].

## 2018-07-25 NOTE — Progress Notes (Signed)
Pulmonary Individual Treatment Plan  Patient Details  Name: [name] MRN: [id number] Date of Birth: 01/03/1928 Referring Provider:     Pulmonary Rehab Walk Test from 07/11/2018 in [place]  Referring Provider  Dr. Halford Chessman      Initial Encounter Date:    Pulmonary Rehab Walk Test from 07/11/2018 in [place]  Date  07/11/18      Visit Diagnosis: Centrilobular emphysema ([place])  Patient's Home Medications on Admission:   Current Outpatient Medications:  .  albuterol (PROVENTIL HFA;VENTOLIN HFA) 108 (90 BASE) MCG/ACT inhaler, Inhale 2 puffs into the lungs every 4 (four) hours as needed for wheezing or shortness of breath., Disp: , Rfl:  .  albuterol (PROVENTIL) (2.5 MG/3ML) 0.083% nebulizer solution, USE 1 VIAL IN NEBULIZER EVERY 6 HOURS. Generic: VENTOLIN (Patient taking differently: Take 2.5 mg by nebulization 3 (three) times daily. ), Disp: 120 vial, Rfl: 10 .  guaiFENesin (MUCINEX) 600 MG 12 hr tablet, Take 2 tablets (1,200 mg total) by mouth 2 (two) times daily as needed. (Patient not taking: Reported on 07/07/2018), Disp: , Rfl:  .  HYDROcodone-acetaminophen (NORCO/VICODIN) 5-325 MG tablet, Take 1-2 tablets by mouth every 6 (six) hours as needed. (Patient not taking: Reported on 07/07/2018), Disp: 10 tablet, Rfl: 0 .  levothyroxine (SYNTHROID, LEVOTHROID) 50 MCG tablet, Take 50 mcg by mouth daily before breakfast. , Disp: , Rfl:  .  mometasone (ASMANEX) 220 MCG/INH inhaler, Inhale 2 puffs into the lungs daily., Disp: , Rfl:  .  predniSONE (DELTASONE) 5 MG tablet, TAKE 1 TABLET (5 MG TOTAL) BY MOUTH DAILY WITH BREAKFAST., Disp: 30 tablet, Rfl: 3 .  simvastatin (ZOCOR) 5 MG tablet, Take 5 mg by mouth at bedtime.  , Disp: , Rfl:  .  Tiotropium Bromide-Olodaterol (STIOLTO RESPIMAT) 2.5-2.5 MCG/ACT AERS, Inhale 2 puffs into the lungs daily., Disp: 1 Inhaler, Rfl: 6 .  triamcinolone cream (KENALOG) 0.1 %, Apply 1 application  topically 2 (two) times daily. , Disp: , Rfl:   Past Medical History: Past Medical History:  Diagnosis Date  . Arthritis    osteoarthritis  . Cancer ([place])    skin cancer only.-no melanoma.  Marland Kitchen COPD (chronic obstructive pulmonary disease) ([place])   . History of kidney stones    lithotripsy x1   . History of oxygen administration    Oxygen @ 2 l/m nasally bedtime  . Hyperlipidemia   . Hypothyroidism   . Pulmonary nodule, right   . 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 Tobacco abuse    Quit 09/17/10    Tobacco Use: Social History   Tobacco Use  Smoking Status Former Smoker  . Packs/day: 1.00  . Years: 55.00  . Pack years: 55.00  . Types: Cigarettes  . Last attempt to quit: 04/22/2011  . Years since quitting: 7.2  Smokeless Tobacco Never Used  Tobacco Comment   occasionally smoked 2-3 times a week--1 cig each time    Labs: Recent Review Flowsheet Data    Labs for ITP Cardiac and Pulmonary Rehab Latest Ref Rng & Units 12/07/2006 12/08/2006 12/11/2006 06/03/2014 05/20/2018   Cholestrol - - 114 ATP III CLASSIFICATION: <200     mg/dL   Desirable 200-239  mg/dL   Borderline High >=240    mg/dL   High - - -   LDLCALC - - 75 Total Cholesterol/HDL:CHD Risk Coronary Heart Disease Risk Table Men   Women 1/2 Average Risk   3.4  3.3 - - -   HDL - - 29(L) - - -   Trlycerides - - 48 - - -   Hemoglobin A1c <5.7 % - - - 6.5(H) -   PHART 7.350 - 7.450 - 7.433 7.430 - 7.371   PCO2ART 32.0 - 48.0 mmHg - 32.9(L) 38.0 - 43.3   HCO3 20.0 - 28.0 mmol/L 21.1 21.6 24.8(H) - 25.0   TCO2 22 - 32 mmol/L 22 22.6 25.9 - 26   ACIDBASEDEF - 2.0 2.0 - - -   O2SAT % - 93.8 94.5 - 97.0      Capillary Blood Glucose: Lab Results  Component Value Date   GLUCAP 103 (H) 05/23/2018     Pulmonary Assessment Scores: Pulmonary Assessment Scores    Row Name 07/07/18 1416         ADL UCSD   ADL Phase  Entry     SOB Score total  62       CAT Score   CAT Score  16         Pulmonary Function Assessment:   Exercise Target Goals: Exercise Program Goal: Individual exercise prescription set using results from initial 6 min walk test and THRR while considering  patient's activity barriers and safety.   Exercise Prescription Goal: Initial exercise prescription builds to 30-45 minutes a day of aerobic activity, 2-3 days per week.  Home exercise guidelines will be given to patient during program as part of exercise prescription that the participant will acknowledge.  Activity Barriers & Risk Stratification: Activity Barriers & Cardiac Risk Stratification - 07/07/18 1407      Activity Barriers & Cardiac Risk Stratification   Activity Barriers  Left Hip Replacement;Arthritis;Shortness of Breath       6 Minute Walk: 6 Minute Walk    Row Name 07/11/18 1341         6 Minute Walk   Phase  Initial     Distance  800 feet     Walk Time  4.75 minutes     # of Rest Breaks  2 both seated. 1 foe 30 sec, 1 for 45 sec     MPH  1.51     METS  2.15     RPE  13     Perceived Dyspnea   3     Symptoms  No     Resting HR  76 bpm     Resting BP  132/64     Resting Oxygen Saturation   99 %     Exercise Oxygen Saturation  during 6 min walk  94 %     Max Ex. HR  108 bpm     Max Ex. BP  152/72     2 Minute Post BP  140/74       Interval HR   1 Minute HR  97     2 Minute HR  107     3 Minute HR  103     4 Minute HR  106     5 Minute HR  107     6 Minute HR  108     2 Minute Post HR  91     Interval Heart Rate?  Yes       Interval Oxygen   Interval Oxygen?  Yes     Baseline Oxygen Saturation %  99 %     1 Minute Oxygen Saturation %  95 %     1 Minute Liters of Oxygen  0 L     2 Minute Oxygen Saturation %  96 %     2 Minute Liters of Oxygen  0 L     3 Minute Oxygen Saturation %  95 %     3 Minute Liters of Oxygen  0 L     4 Minute Oxygen Saturation %  96 %     4 Minute Liters of Oxygen  0 L     5 Minute Oxygen Saturation %  95 %     5 Minute Liters of  Oxygen  0 L     6 Minute Oxygen Saturation %  94 %     6 Minute Liters of Oxygen  0 L     2 Minute Post Oxygen Saturation %  99 %     2 Minute Post Liters of Oxygen  0 L        Oxygen Initial Assessment: Oxygen Initial Assessment - 07/24/18 1108      Home Oxygen   Home Oxygen Device  Home Concentrator;Portable Concentrator    Sleep Oxygen Prescription  Continuous    Liters per minute  2    Home Exercise Oxygen Prescription  None    Home at Rest Exercise Oxygen Prescription  None    Compliance with Home Oxygen Use  Yes      Program Oxygen Prescription   Program Oxygen Prescription  None      Intervention   Short Term Goals  To learn and exhibit compliance with exercise, home and travel O2 prescription;To learn and understand importance of monitoring SPO2 with pulse oximeter and demonstrate accurate use of the pulse oximeter.;To learn and demonstrate proper pursed lip breathing techniques or other breathing techniques.;To learn and understand importance of maintaining oxygen saturations>88%;To learn and demonstrate proper use of respiratory medications    Long  Term Goals  Exhibits compliance with exercise, home and travel O2 prescription;Verbalizes importance of monitoring SPO2 with pulse oximeter and return demonstration;Maintenance of O2 saturations>88%;Exhibits proper breathing techniques, such as pursed lip breathing or other method taught during program session;Compliance with respiratory medication;Demonstrates proper use of MDI's       Oxygen Re-Evaluation:   Oxygen Discharge (Final Oxygen Re-Evaluation):   Initial Exercise Prescription: Initial Exercise Prescription - 07/11/18 1300      Date of Initial Exercise RX and Referring Provider   Date  07/11/18    Referring Provider  Dr. Halford Chessman      Recumbant Bike   Level  1    Minutes  17      NuStep   Level  2    SPM  80    Minutes  17      Track   Laps  9    Minutes  17      Prescription Details   Frequency  (times per week)  2    Duration  Progress to 45 minutes of aerobic exercise without signs/symptoms of physical distress      Intensity   THRR 40-80% of Max Heartrate  53-106    Ratings of Perceived Exertion  11-13    Perceived Dyspnea  0-4      Progression   Progression  Continue to progress workloads to maintain intensity without signs/symptoms of physical distress.      Resistance Training   Training Prescription  Yes    Weight  orange bands    Reps  10-15       Perform Capillary Blood Glucose checks as needed.  Exercise Prescription Changes: Exercise Prescription Changes    Row Name 07/25/18 1500             Response to Exercise   Blood Pressure (Admit)  136/74       Blood Pressure (Exercise)  150/74       Blood Pressure (Exit)  106/70       Heart Rate (Admit)  87 bpm       Heart Rate (Exercise)  108 bpm       Heart Rate (Exit)  97 bpm       Oxygen Saturation (Admit)  99 %       Oxygen Saturation (Exercise)  95 %       Oxygen Saturation (Exit)  98 %       Rating of Perceived Exertion (Exercise)  14       Perceived Dyspnea (Exercise)  4       Duration  Progress to 45 minutes of aerobic exercise without signs/symptoms of physical distress       Intensity  - 40-80% HRR         Progression   Progression  Continue to progress workloads to maintain intensity without signs/symptoms of physical distress.         Resistance Training   Training Prescription  Yes       Weight  orange bands       Reps  10-15       Time  10 Minutes         Interval Training   Interval Training  No         Recumbant Bike   Level  1       Minutes  17         NuStep   Level  2       SPM  80       Minutes  17       METs  2.4         Track   Laps  9       Minutes  17          Exercise Comments:   Exercise Goals and Review: Exercise Goals    Row Name 07/07/18 1408             Exercise Goals   Increase Physical Activity  Yes       Intervention  Provide advice,  education, support and counseling about physical activity/exercise needs.;Develop an individualized exercise prescription for aerobic and resistive training based on initial evaluation findings, risk stratification, comorbidities and participant's personal goals.       Expected Outcomes  Short Term: Attend rehab on a regular basis to increase amount of physical activity.       Increase Strength and Stamina  Yes       Intervention  Provide advice, education, support and counseling about physical activity/exercise needs.;Develop an individualized exercise prescription for aerobic and resistive training based on initial evaluation findings, risk stratification, comorbidities and participant's personal goals.       Expected Outcomes  Short Term: Increase workloads from initial exercise prescription for resistance, speed, and METs.       Able to understand and use rate of perceived exertion (RPE) scale  Yes       Intervention  Provide education and explanation on how to use RPE scale       Expected Outcomes  Short Term: Able to use RPE daily in rehab to express subjective intensity  level;Long Term:  Able to use RPE to guide intensity level when exercising independently       Able to understand and use Dyspnea scale  Yes       Intervention  Provide education and explanation on how to use Dyspnea scale       Expected Outcomes  Short Term: Able to use Dyspnea scale daily in rehab to express subjective sense of shortness of breath during exertion;Long Term: Able to use Dyspnea scale to guide intensity level when exercising independently       Knowledge and understanding of Target Heart Rate Range (THRR)  Yes       Intervention  Provide education and explanation of THRR including how the numbers were predicted and where they are located for reference       Expected Outcomes  Short Term: Able to state/look up THRR;Long Term: Able to use THRR to govern intensity when exercising independently;Short Term: Able to use  daily as guideline for intensity in rehab       Understanding of Exercise Prescription  Yes       Intervention  Provide education, explanation, and written materials on patient's individual exercise prescription       Expected Outcomes  Short Term: Able to explain program exercise prescription;Long Term: Able to explain home exercise prescription to exercise independently          Exercise Goals Re-Evaluation : Exercise Goals Re-Evaluation    Row Name 07/24/18 1106             Exercise Goal Re-Evaluation   Exercise Goals Review  Increase Physical Activity;Increase Strength and Stamina;Able to understand and use rate of perceived exertion (RPE) scale;Able to understand and use Dyspnea scale;Knowledge and understanding of Target Heart Rate Range (THRR);Understanding of Exercise Prescription       Comments  Patient has only completed 2 rehab sessions. His barriers are extreme shortness of breath and deconditioning. I will discuss with him the importance of home exercise. Will cont to monitor and progress as able.       Expected Outcomes  Through exercise at rehab and at home, the patient will decrease shortness of breath with daily activities and feel confident in carrying out an exercise regime at home.           Discharge Exercise Prescription (Final Exercise Prescription Changes): Exercise Prescription Changes - 07/25/18 1500      Response to Exercise   Blood Pressure (Admit)  136/74    Blood Pressure (Exercise)  150/74    Blood Pressure (Exit)  106/70    Heart Rate (Admit)  87 bpm    Heart Rate (Exercise)  108 bpm    Heart Rate (Exit)  97 bpm    Oxygen Saturation (Admit)  99 %    Oxygen Saturation (Exercise)  95 %    Oxygen Saturation (Exit)  98 %    Rating of Perceived Exertion (Exercise)  14    Perceived Dyspnea (Exercise)  4    Duration  Progress to 45 minutes of aerobic exercise without signs/symptoms of physical distress    Intensity  --   40-80% HRR     Progression    Progression  Continue to progress workloads to maintain intensity without signs/symptoms of physical distress.      Resistance Training   Training Prescription  Yes    Weight  orange bands    Reps  10-15    Time  10 Minutes      Interval  Training   Interval Training  No      Recumbant Bike   Level  1    Minutes  17      NuStep   Level  2    SPM  80    Minutes  17    METs  2.4      Track   Laps  9    Minutes  17       Nutrition:  Target Goals: Understanding of nutrition guidelines, daily intake of sodium <1516m, cholesterol <2071m calories 30% from fat and 7% or less from saturated fats, daily to have 5 or more servings of fruits and vegetables.  Biometrics: Pre Biometrics - 07/07/18 1410      Pre Biometrics   Grip Strength  35 kg        Nutrition Therapy Plan and Nutrition Goals: Nutrition Therapy & Goals - 07/25/18 1527      Nutrition Therapy   Diet  general healthful      Personal Nutrition Goals   Nutrition Goal  Pt to identify and consume food sources higher in protein and higher in calories    Personal Goal #2  The pt will recognize symptoms that can interfere with adequate oral intake    Personal Goal #3  The pt will consume high-energy, high-nutrient dense beverages when necessary to compensate for decreased oral intake of solid foods      Intervention Plan   Intervention  Prescribe, educate and counsel regarding individualized specific dietary modifications aiming towards targeted core components such as weight, hypertension, lipid management, diabetes, heart failure and other comorbidities.    Expected Outcomes  Short Term Goal: Understand basic principles of dietary content, such as calories, fat, sodium, cholesterol and nutrients.;Long Term Goal: Adherence to prescribed nutrition plan.       Nutrition Assessments: Nutrition Assessments - 07/13/18 1526      Rate Your Plate Scores   Pre Score  50       Nutrition Goals  Re-Evaluation: Nutrition Goals Re-Evaluation    [place] 07/13/18 1526             Goals   Current Weight  173 lb 4.5 oz (78.6 kg)       Nutrition Goal  to be determined          Nutrition Goals Discharge (Final Nutrition Goals Re-Evaluation): Nutrition Goals Re-Evaluation - 07/13/18 1526      Goals   Current Weight  173 lb 4.5 oz (78.6 kg)    Nutrition Goal  to be determined       Psychosocial: Target Goals: Acknowledge presence or absence of significant depression and/or stress, maximize coping skills, provide positive support system. Participant is able to verbalize types and ability to use techniques and skills needed for reducing stress and depression.  Initial Review & Psychosocial Screening: Initial Psych Review & Screening - 07/07/18 1416      Initial Review   Current issues with  None Identified      Family Dynamics   Good Support System?  Yes      Barriers   Psychosocial barriers to participate in program  There are no identifiable barriers or psychosocial needs.      Screening Interventions   Interventions  Encouraged to exercise    Expected Outcomes  Short Term goal: Identification and review with participant of any Quality of Life or Depression concerns found by scoring the questionnaire.;Long Term goal: The participant improves quality of Life and PHQ9  Scores as seen by post scores and/or verbalization of changes       Quality of Life Scores:  Scores of 19 and below usually indicate a poorer quality of life in these areas.  A difference of  2-3 points is a clinically meaningful difference.  A difference of 2-3 points in the total score of the Quality of Life Index has been associated with significant improvement in overall quality of life, self-image, physical symptoms, and general health in studies assessing change in quality of life.  PHQ-9: Recent Review Flowsheet Data    Depression screen [facility] 2/9 07/07/2018 10/03/2017 01/05/2017   Decreased Interest 0  0 0   Down, Depressed, Hopeless 0 0 0   PHQ - 2 Score 0 0 0   Altered sleeping 0 - -   Tired, decreased energy 0 - -   Change in appetite 0 - -   Feeling bad or failure about yourself  0 - -   Trouble concentrating 0 - -   Moving slowly or fidgety/restless 0 - -   Suicidal thoughts 0 - -   PHQ-9 Score 0 - -   Difficult doing work/chores Not difficult at all - -     Interpretation of Total Score  Total Score Depression Severity:  1-4 = Minimal depression, 5-9 = Mild depression, 10-14 = Moderate depression, 15-19 = Moderately severe depression, 20-27 = Severe depression   Psychosocial Evaluation and Intervention: Psychosocial Evaluation - 07/07/18 1521      Psychosocial Evaluation & Interventions   Interventions  Stress management education;Relaxation education;Encouraged to exercise with the program and follow exercise prescription    Comments  Pt states he has no stressors in his life. He has a wife, who he states will be starting cardiac rehab later this month, who is a good support system for him a well as 4 children. Children do not live in the area, but he maintains contact.    Expected Outcomes  Pt will continue to have a positive and healthy outlook    Continue Psychosocial Services   Follow up required by staff       Psychosocial Re-Evaluation: Psychosocial Re-Evaluation    [place] Name 07/25/18 1641             Psychosocial Re-Evaluation   Current issues with  None Identified       Comments  No psychosocial barriers       Expected Outcomes  patient remained free from psychosocial barriers to participation in pulmonary rehab       Interventions  Encouraged to attend Pulmonary Rehabilitation for the exercise       Continue Psychosocial Services   No Follow up required          Psychosocial Discharge (Final Psychosocial Re-Evaluation): Psychosocial Re-Evaluation - 07/25/18 1641      Psychosocial Re-Evaluation   Current issues with  None Identified    Comments  No  psychosocial barriers    Expected Outcomes  patient remained free from psychosocial barriers to participation in pulmonary rehab    Interventions  Encouraged to attend Pulmonary Rehabilitation for the exercise    Continue Psychosocial Services   No Follow up required       Education: Education Goals: Education classes will be provided on a weekly basis, covering required topics. Participant will state understanding/return demonstration of topics presented.  Learning Barriers/Preferences: Learning Barriers/Preferences - 07/07/18 1420      Learning Barriers/Preferences   Learning Barriers  Sight    Learning  Preferences  Verbal Instruction;Skilled Demonstration;Individual Instruction;Group Instruction;Computer/Internet       Education Topics: Risk Factor Reduction:  -Group instruction that is supported by a PowerPoint presentation. Instructor discusses the definition of a risk factor, different risk factors for pulmonary disease, and how the heart and lungs work together.     Nutrition for Pulmonary Patient:  -Group instruction provided by PowerPoint slides, verbal discussion, and written materials to support subject matter. The instructor gives an explanation and review of healthy diet recommendations, which includes a discussion on weight management, recommendations for fruit and vegetable consumption, as well as protein, fluid, caffeine, fiber, sodium, sugar, and alcohol. Tips for eating when patients are short of breath are discussed.   Pursed Lip Breathing:  -Group instruction that is supported by demonstration and informational handouts. Instructor discusses the benefits of pursed lip and diaphragmatic breathing and detailed demonstration on how to preform both.     Oxygen Safety:  -Group instruction provided by PowerPoint, verbal discussion, and written material to support subject matter. There is an overview of "What is Oxygen" and "Why do we need it".  Instructor also reviews  how to create a safe environment for oxygen use, the importance of using oxygen as prescribed, and the risks of noncompliance. There is a brief discussion on traveling with oxygen and resources the patient may utilize.   Oxygen Equipment:  -Group instruction provided by [facility] Staff utilizing handouts, written materials, and equipment demonstrations.   PULMONARY REHAB OTHER RESPIRATORY from 12/15/2017 in [place]  Date  10/20/17  Educator  george with [employer]  Instruction Review Code  1- Verbalizes Understanding      Signs and Symptoms:  -Group instruction provided by written material and verbal discussion to support subject matter. Warning signs and symptoms of infection, stroke, and heart attack are reviewed and when to call the physician/911 reinforced. Tips for preventing the spread of infection discussed.   Advanced Directives:  -Group instruction provided by verbal instruction and written material to support subject matter. Instructor reviews Advanced Directive laws and proper instruction for filling out document.   Pulmonary Video:  -Group video education that reviews the importance of medication and oxygen compliance, exercise, good nutrition, pulmonary hygiene, and pursed lip and diaphragmatic breathing for the pulmonary patient.   Exercise for the Pulmonary Patient:  -Group instruction that is supported by a PowerPoint presentation. Instructor discusses benefits of exercise, core components of exercise, frequency, duration, and intensity of an exercise routine, importance of utilizing pulse oximetry during exercise, safety while exercising, and options of places to exercise outside of rehab.     PULMONARY REHAB OTHER RESPIRATORY from 12/15/2017 in [place]  Date  10/27/17  Educator  Cloyde Reams  Instruction Review Code  1- Verbalizes Understanding      Pulmonary Medications:  -Verbally interactive group  education provided by instructor with focus on inhaled medications and proper administration.   PULMONARY REHAB OTHER RESPIRATORY from 12/15/2017 in [place]  Date  12/08/17  Educator  pharmacy  Instruction Review Code  1- Verbalizes Understanding      Anatomy and Physiology of the Respiratory System and Intimacy:  -Group instruction provided by PowerPoint, verbal discussion, and written material to support subject matter. Instructor reviews respiratory cycle and anatomical components of the respiratory system and their functions. Instructor also reviews differences in obstructive and restrictive respiratory diseases with examples of each. Intimacy, Sex, and Sexuality differences are reviewed with a  discussion on how relationships can change when diagnosed with pulmonary disease. Common sexual concerns are reviewed.   PULMONARY REHAB OTHER RESPIRATORY from 07/20/2018 in [place]  Date  07/20/18  Educator  RN  Instruction Review Code  1- Verbalizes Understanding      MD DAY -A group question and answer session with a medical doctor that allows participants to ask questions that relate to their pulmonary disease state.   PULMONARY REHAB OTHER RESPIRATORY from 12/15/2017 in [place]  Date  11/24/17  Educator  Dr.Yacoub  Instruction Review Code  1- Verbalizes Understanding      OTHER EDUCATION -Group or individual verbal, written, or video instructions that support the educational goals of the pulmonary rehab program.   PULMONARY REHAB OTHER RESPIRATORY from 12/15/2017 in [place]  Date  12/15/17  Educator  [place]  Instruction Review Code  1- Verbalizes Understanding      Holiday Eating Survival Tips:  -Group instruction provided by PowerPoint slides, verbal discussion, and written materials to support subject  matter. The instructor gives patients tips, tricks, and techniques to help them not only survive but enjoy the holidays despite the onslaught of food that accompanies the holidays.   Knowledge Questionnaire Score: Knowledge Questionnaire Score - 07/07/18 1421      Knowledge Questionnaire Score   Pre Score  18/18       Core Components/Risk Factors/Patient Goals at Admission: Personal Goals and Risk Factors at Admission - 07/07/18 1421      Core Components/Risk Factors/Patient Goals on Admission    Weight Management  Weight Maintenance    Improve shortness of breath with ADL's  Yes    Intervention  Provide education, individualized exercise plan and daily activity instruction to help decrease symptoms of SOB with activities of daily living.    Expected Outcomes  Short Term: Improve cardiorespiratory fitness to achieve a reduction of symptoms when performing ADLs;Long Term: Be able to perform more ADLs without symptoms or delay the onset of symptoms    Hypertension  Yes    Intervention  Provide education on lifestyle modifcations including regular physical activity/exercise, weight management, moderate sodium restriction and increased consumption of fresh fruit, vegetables, and low fat dairy, alcohol moderation, and smoking cessation.;Monitor prescription use compliance.    Expected Outcomes  Short Term: Continued assessment and intervention until BP is < 140/43m HG in hypertensive participants. < 130/815mHG in hypertensive participants with diabetes, heart failure or chronic kidney disease.;Long Term: Maintenance of blood pressure at goal levels.    Lipids  Yes    Intervention  Provide education and support for participant on nutrition & aerobic/resistive exercise along with prescribed medications to achieve LDL <705mHDL >25m48m  Expected Outcomes  Short Term: Participant states understanding of desired cholesterol values and is compliant with medications prescribed. Participant is  following exercise prescription and nutrition guidelines.;Long Term: Cholesterol controlled with medications as prescribed, with individualized exercise RX and with personalized nutrition plan. Value goals: LDL < 70mg21mL > 40 mg.       Core Components/Risk Factors/Patient Goals Review:  Goals and Risk Factor Review    Row Name 07/07/18 1422 07/25/18 1641           Core Components/Risk Factors/Patient Goals Review   Personal Goals Review  Improve shortness of breath with ADL's;Increase knowledge of respiratory medications and ability to use respiratory devices properly.;Develop more efficient  breathing techniques such as purse lipped breathing and diaphragmatic breathing and practicing self-pacing with activity.;Hypertension  Improve shortness of breath with ADL's;Increase knowledge of respiratory medications and ability to use respiratory devices properly.;Develop more efficient breathing techniques such as purse lipped breathing and diaphragmatic breathing and practicing self-pacing with activity.;Hypertension      Review  -  Just started the program, has only attended 3 exercise sessions, too early to have met any goals. Hypertension is controlled.      Expected Outcomes  -  see admission expected outcomes.         Core Components/Risk Factors/Patient Goals at Discharge (Final Review):  Goals and Risk Factor Review - 07/25/18 1641      Core Components/Risk Factors/Patient Goals Review   Personal Goals Review  Improve shortness of breath with ADL's;Increase knowledge of respiratory medications and ability to use respiratory devices properly.;Develop more efficient breathing techniques such as purse lipped breathing and diaphragmatic breathing and practicing self-pacing with activity.;Hypertension    Review  Just started the program, has only attended 3 exercise sessions, too early to have met any goals. Hypertension is controlled.    Expected Outcomes  see admission expected outcomes.        ITP Comments: ITP Comments    Row Name 07/07/18 1350           ITP Comments  Dr. Jennet Maduro, Medical Director, Pulmonary Rehab          Comments: ITP REVIEW Pt is making expected progress toward pulmonary rehab goals after completing 3 sessions. Recommend continued exercise, life style modification, education, and utilization of breathing techniques to increase stamina and strength and decrease shortness of breath with exertion.

## 2018-07-27 ENCOUNTER — Encounter (HOSPITAL_COMMUNITY)
Admission: RE | Admit: 2018-07-27 | Discharge: 2018-07-27 | Disposition: A | Discharge status: Home / Self Care | Payer: Medicare Other | Source: Ambulatory Visit | Attending: Pulmonary Disease | Admitting: Pulmonary Disease

## 2018-07-27 DIAGNOSIS — J432 Centrilobular emphysema: Secondary | ICD-10-CM | POA: Diagnosis not present

## 2018-07-27 NOTE — Progress Notes (Signed)
Daily Session Note  Patient Details  Name: [name] MRN: [id number] Date of Birth: Mar 04, 1928 Referring Provider:     Pulmonary Rehab Walk Test from 07/11/2018 in [place]  Referring Provider  Dr. Halford Chessman      Encounter Date: 07/27/2018  Check In: Session Check In - 07/27/18 1354      Check-In   Supervising physician immediately available to respond to emergencies  [facility] immediately available    Physician(s)  Dr. Broadus John    Location  [facility]    Staff Present  Joycelyn Man RN, BSN;Hanne Kegg, MS, ACSM RCEP, Exercise Physiologist;Dalton Kris Mouton, MS, Exercise Physiologist;Lisa Ysidro Evert, RN    Medication changes reported      No    Fall or balance concerns reported     No    Tobacco Cessation  No Change    Warm-up and Cool-down  Performed as group-led instruction    Resistance Training Performed  Yes    VAD Patient?  No    PAD/SET Patient?  No      Pain Assessment   Currently in Pain?  No/denies    Pain Score  0-No pain    Multiple Pain Sites  No       Capillary Blood Glucose: No results found for this or any previous visit (from the past 24 hour(s)).    Social History   Tobacco Use  Smoking Status Former Smoker  . Packs/day: 1.00  . Years: 55.00  . Pack years: 55.00  . Types: Cigarettes  . Last attempt to quit: 04/22/2011  . Years since quitting: 7.2  Smokeless Tobacco Never Used  Tobacco Comment   occasionally smoked 2-3 times a week--1 cig each time    Goals Met:  Exercise tolerated well  Goals Unmet:  Not Applicable  Comments: Service time is from 1:30p to 3:30p    Dr. Rush Farmer is Medical Director for Pulmonary Rehab at [facility].

## 2018-08-01 ENCOUNTER — Encounter: Payer: Self-pay | Admitting: Pulmonary Disease

## 2018-08-01 ENCOUNTER — Ambulatory Visit (INDEPENDENT_AMBULATORY_CARE_PROVIDER_SITE_OTHER): Payer: Medicare Other | Admitting: Pulmonary Disease

## 2018-08-01 ENCOUNTER — Encounter (HOSPITAL_COMMUNITY)
Admission: RE | Admit: 2018-08-01 | Discharge: 2018-08-01 | Disposition: A | Discharge status: Home / Self Care | Payer: Medicare Other | Source: Ambulatory Visit | Attending: Pulmonary Disease | Admitting: Pulmonary Disease

## 2018-08-01 VITALS — BP 120/68 | HR 100 | Ht 70.0 in | Wt 174.0 lb

## 2018-08-01 DIAGNOSIS — J9611 Chronic respiratory failure with hypoxia: Secondary | ICD-10-CM | POA: Diagnosis not present

## 2018-08-01 DIAGNOSIS — J432 Centrilobular emphysema: Secondary | ICD-10-CM

## 2018-08-01 NOTE — Progress Notes (Signed)
Daily Session Note  Patient Details  Name: [name] MRN: [id number] Date of Birth: April 02, 1928 Referring Provider:     Pulmonary Rehab Walk Test from 07/11/2018 in [place]  Referring Provider  Dr. Halford Chessman      Encounter Date: 08/01/2018  Check In: Session Check In - 08/01/18 1330      Check-In   Supervising physician immediately available to respond to emergencies  [facility] immediately available    Physician(s)  Dr. Broadus John    Location  [facility]    Staff Present  Rosebud Poles, RN, BSN;Molly DiVincenzo, MS, ACSM RCEP, Exercise Physiologist;Chrysta Fulcher Ysidro Evert, RN;Dalton Fletcher, MS, Exercise Physiologist    Medication changes reported      No    Fall or balance concerns reported     No    Tobacco Cessation  No Change    Warm-up and Cool-down  Performed as group-led instruction    Resistance Training Performed  Yes    VAD Patient?  No    PAD/SET Patient?  No      Pain Assessment   Currently in Pain?  No/denies    Multiple Pain Sites  No       Capillary Blood Glucose: No results found for this or any previous visit (from the past 24 hour(s)).    Social History   Tobacco Use  Smoking Status Former Smoker  . Packs/day: 1.00  . Years: 55.00  . Pack years: 55.00  . Types: Cigarettes  . Last attempt to quit: 04/22/2011  . Years since quitting: 7.2  Smokeless Tobacco Never Used  Tobacco Comment   occasionally smoked 2-3 times a week--1 cig each time    Goals Met:  Exercise tolerated well No report of cardiac concerns or symptoms Strength training completed today  Goals Unmet:  Not Applicable  Comments: Service time is from 1330 to 1500    Dr. Rush Farmer is Medical Director for Pulmonary Rehab at [facility]

## 2018-08-01 NOTE — Patient Instructions (Signed)
Take 5 mg prednisone on odd number days, and 2.5 mg prednisone on even number days for the next 3 weeks.  If your breathing stays stable, then change to 2.5 mg prednisone daily.  Follow up in 6 months

## 2018-08-01 NOTE — Progress Notes (Signed)
[name] Pulmonary, Critical Care, and Sleep Medicine  Chief Complaint  Patient presents with  . Hospitalization Follow-up    F/U for COPD. Uses oxygen at night time 2L. States his breathing has been mostly well but fluctuates.     Constitutional:  BP 120/68   Pulse 100   Ht 5\' 10"  (1.778 m)   Wt 174 lb (78.9 kg)   SpO2 96%   BMI 24.97 kg/m   Past Medical History:  Squamous cell skin cancer, Melanoma, Hypothyroidism, HLD, Nephrolithiasis, OA, Hypothyroidism  Brief Summary:  [name] is a 85 y.o. 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  He was seen by [name] in December exacerbation.  Doing better now.  Still on 5 mg prednisone.  Gets occasional cough with clear sputum.  Also gets occasional wheezing.  Not having fever, chest pain, hemoptysis, or leg swelling.  Gets winded if he does too much, but okay at slow pace.  Uses 2 liters oxygen at night.  Physical Exam:   Appearance - well kempt   ENMT - clear nasal mucosa, midline nasal  septum, no oral exudates, no LAN, trachea midline  Respiratory - normal chest wall, normal respiratory effort, no accessory muscle use, no wheeze/rales, decreased breath sounds  CV - s1s2 regular rate and rhythm, no murmurs, no peripheral edema, radial pulses symmetric  GI - soft, non tender, no masses  Lymph - no adenopathy noted in neck and axillary areas  MSK - normal gait  Ext - no cyanosis, clubbing, or joint inflammation noted  Skin - no rashes, lesions, or ulcers  Neuro - normal strength, oriented x 3  Psych - normal mood and affect   Assessment/Plan:   COPD with emphysema. - prednisone dependent since May 2015 - will try to gradually reduce his baseline prednisone dose to 2.5 mg daily as tolerated - he gets stiolto and asmanex from [place]; asked him to check with [place] if trelegy is now on formulary - continue pulmonary rehab - prn albuterol  Chronic  hypoxic respiratory failure. - continue 2 liters oxygen at night   Patient Instructions  Take 5 mg prednisone on odd number days, and 2.5 mg prednisone on even number days for the next 3 weeks.  If your breathing stays stable, then change to 2.5 mg prednisone daily.  Follow up in 6 months    Sean Mires, MD [place] [facility] Pager: [phone] 08/01/2018, 12:17 PM  Flow Sheet     Pulmonary tests:  PFT 07/14/06 >>FEV1 1.41(51%), FEV1% 60, TLC 7.3(123%), DLCO 75%, no BD  PFT 07/29/10 >> FEV1 1.30(50%), FEV1% 50, TLC 6.39(100%), DLCO 68%, no BD Spirometry 11/17/10 >> FEV1 1.15(36%), FEV1% 56 Spirometry 10/26/13 >> FEV1 0.92 (30%), FEV1% 39 Spirometry 04/07/15 >> FEV1 0.91, FEV1% 42  Sleep tests:  ONO with RA 11/30/12 >>Test time 9 hrs 30 min. Mean SpO2 92.8%, low SpO2 86%. Spent 3 min with SpO2 <88% ONO with 2 liters 01/16/14 >>test time 8 hrs 36 min. Basal SpO2 93%, low SpO2 85%. Spent 4 min with SpO2 <88%. CT angio chest 07/14/15 >> atherosclerosis, several nodules  Medications:   Allergies as of 08/01/2018      Reactions   Sulfa Antibiotics    Sulfonamide Derivatives    Unknown - as a child      Medication List       Accurate as of August 01, 2018 12:17 PM. Always use your most recent med list.  albuterol 108 (90 Base) MCG/ACT inhaler Commonly known as:  PROVENTIL HFA;VENTOLIN HFA Inhale 2 puffs into the lungs every 4 (four) hours as needed for wheezing or shortness of breath.   albuterol (2.5 MG/3ML) 0.083% nebulizer solution Commonly known as:  PROVENTIL USE 1 VIAL IN NEBULIZER EVERY 6 HOURS. Generic: VENTOLIN   guaiFENesin 600 MG 12 hr tablet Commonly known as:  MUCINEX Take 2 tablets (1,200 mg total) by mouth 2 (two) times daily as needed.   levothyroxine 50 MCG tablet Commonly known as:  SYNTHROID, LEVOTHROID Take 50 mcg by mouth daily before breakfast.   mometasone 220 MCG/INH inhaler Commonly known as:  ASMANEX Inhale 2  puffs into the lungs daily.   predniSONE 5 MG tablet Commonly known as:  DELTASONE TAKE 1 TABLET (5 MG TOTAL) BY MOUTH DAILY WITH BREAKFAST.   simvastatin 5 MG tablet Commonly known as:  ZOCOR Take 5 mg by mouth at bedtime.   Tiotropium Bromide-Olodaterol 2.5-2.5 MCG/ACT Aers Commonly known as:  STIOLTO RESPIMAT Inhale 2 puffs into the lungs daily.       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  Family History:  His family history is not on file.  Social History:  He  reports that he quit smoking about 7 years ago. His smoking use included cigarettes. He has a 55.00 pack-year smoking history. He has never used smokeless tobacco. He reports current alcohol use. He reports that he does not use drugs.

## 2018-08-03 ENCOUNTER — Encounter (HOSPITAL_COMMUNITY)
Admission: RE | Admit: 2018-08-03 | Discharge: 2018-08-03 | Disposition: A | Discharge status: Home / Self Care | Payer: Medicare Other | Source: Ambulatory Visit | Attending: Pulmonary Disease | Admitting: Pulmonary Disease

## 2018-08-03 DIAGNOSIS — J432 Centrilobular emphysema: Secondary | ICD-10-CM

## 2018-08-03 NOTE — Progress Notes (Signed)
Daily Session Note  Patient Details  Name: DELMON ANDRADA MRN: [id number] Date of Birth: [birth year] Referring Provider:     Pulmonary Rehab Walk Test from 07/11/2018 in [place]  Referring Provider  Dr. Halford Chessman      Encounter Date: 08/03/2018  Check In: Session Check In - 08/03/18 1407      Check-In   Supervising physician immediately available to respond to emergencies  [facility] immediately available    Physician(s)  Dr. [place] Crane    Location  [facility]    Staff Present  Su Hilt, MS, ACSM RCEP, Exercise Physiologist;Dalton Kris Mouton, MS, Exercise Physiologist;Lisa Ysidro Evert, RN;Nichol Ator Leonia Reeves, RN, BSN    Medication changes reported      No    Fall or balance concerns reported     No    Tobacco Cessation  No Change    Warm-up and Cool-down  Performed as group-led instruction    Resistance Training Performed  Yes    VAD Patient?  No    PAD/SET Patient?  No      Pain Assessment   Currently in Pain?  No/denies    Pain Score  0-No pain    Multiple Pain Sites  No       Capillary Blood Glucose: No results found for this or any previous visit (from the past 24 hour(s)).    Social History   Tobacco Use  Smoking Status Former Smoker  . Packs/day: 1.00  . Years: 55.00  . Pack years: 55.00  . Types: Cigarettes  . Last attempt to quit: 04/22/2011  . Years since quitting: 7.2  Smokeless Tobacco Never Used  Tobacco Comment   occasionally smoked 2-3 times a week--1 cig each time    Goals Met:  Proper associated with RPD/PD & O2 Sat Exercise tolerated well Strength training completed today  Goals Unmet:  Not Applicable  Comments: Service time is from 1330 to 1525    Dr. Rush Farmer is Medical Director for Pulmonary Rehab at [facility].

## 2018-08-08 ENCOUNTER — Encounter (HOSPITAL_COMMUNITY)
Admission: RE | Admit: 2018-08-08 | Discharge: 2018-08-08 | Disposition: A | Discharge status: Home / Self Care | Payer: Medicare Other | Source: Ambulatory Visit | Attending: Pulmonary Disease | Admitting: Pulmonary Disease

## 2018-08-08 VITALS — Wt 175.3 lb

## 2018-08-08 DIAGNOSIS — J432 Centrilobular emphysema: Secondary | ICD-10-CM | POA: Diagnosis not present

## 2018-08-08 NOTE — Progress Notes (Signed)
Daily Session Note  Patient Details  Name: [name] MRN: [id number] Date of Birth: 15-Aug-1927 Referring Provider:     Pulmonary Rehab Walk Test from 07/11/2018 in [place]  Referring Provider  Dr. Halford Chessman      Encounter Date: 08/08/2018  Check In: Session Check In - 08/08/18 1359      Check-In   Supervising physician immediately available to respond to emergencies  [facility] immediately available    Physician(s)  Dr. Nevada Crane    Location  [facility]    Staff Present  Rosebud Poles, RN, BSN;Molly DiVincenzo, MS, ACSM RCEP, Exercise Physiologist;Dalton Kris Mouton, MS, Exercise Physiologist;Lisa Ysidro Evert, RN;Carlette Wilber Oliphant, RN, BSN    Medication changes reported      No    Fall or balance concerns reported     No    Warm-up and Cool-down  Performed as group-led instruction    Resistance Training Performed  Yes    VAD Patient?  No    PAD/SET Patient?  No      Pain Assessment   Currently in Pain?  No/denies    Pain Score  0-No pain    Multiple Pain Sites  No       Capillary Blood Glucose: No results found for this or any previous visit (from the past 24 hour(s)).  Exercise Prescription Changes - 08/08/18 1600      Response to Exercise   Blood Pressure (Admit)  128/66    Blood Pressure (Exercise)  158/80    Blood Pressure (Exit)  104/70    Heart Rate (Admit)  88 bpm    Heart Rate (Exercise)  109 bpm    Heart Rate (Exit)  86 bpm    Oxygen Saturation (Admit)  98 %    Oxygen Saturation (Exercise)  95 %    Oxygen Saturation (Exit)  100 %    Rating of Perceived Exertion (Exercise)  13    Perceived Dyspnea (Exercise)  3    Duration  Progress to 45 minutes of aerobic exercise without signs/symptoms of physical distress    Intensity  THRR unchanged      Progression   Progression  Continue to progress workloads to maintain intensity without signs/symptoms of physical distress.      Resistance Training   Training  Prescription  Yes    Weight  orange bands    Reps  10-15    Time  10 Minutes      Interval Training   Interval Training  No      Recumbant Bike   Level  2    Minutes  17      NuStep   Level  3    SPM  80    Minutes  17    METs  2.2      Track   Laps  10    Minutes  17       Social History   Tobacco Use  Smoking Status Former Smoker  . Packs/day: 1.00  . Years: 55.00  . Pack years: 55.00  . Types: Cigarettes  . Last attempt to quit: 04/22/2011  . Years since quitting: 7.3  Smokeless Tobacco Never Used  Tobacco Comment   occasionally smoked 2-3 times a week--1 cig each time    Goals Met:  Proper associated with RPD/PD & O2 Sat Exercise tolerated well Strength training completed today  Goals Unmet:  Not Applicable  Comments: Service time is from 1330 to  1500    Dr. Rush Farmer is Medical Director for Pulmonary Rehab at [facility].

## 2018-08-10 ENCOUNTER — Encounter (HOSPITAL_COMMUNITY)
Admission: RE | Admit: 2018-08-10 | Discharge: 2018-08-10 | Disposition: A | Discharge status: Home / Self Care | Payer: Medicare Other | Source: Ambulatory Visit | Attending: Pulmonary Disease | Admitting: Pulmonary Disease

## 2018-08-10 DIAGNOSIS — J432 Centrilobular emphysema: Secondary | ICD-10-CM | POA: Diagnosis not present

## 2018-08-10 NOTE — Progress Notes (Signed)
Daily Session Note  Patient Details  Name: [name] MRN: [id number] Date of Birth: Feb 07, 1928 Referring Provider:     Pulmonary Rehab Walk Test from 07/11/2018 in [place]  Referring Provider  Dr. Halford Chessman      Encounter Date: 08/10/2018  Check In: Session Check In - 08/10/18 1334      Check-In   Supervising physician immediately available to respond to emergencies  [facility] immediately available    Physician(s)  Dr. Herbert Moors    Location  [facility]    Staff Present  Joycelyn Man RN, BSN;Tahjae Durr Leonia Reeves, RN, BSN;Molly DiVincenzo, MS, ACSM RCEP, Exercise Physiologist;Dalton Kris Mouton, MS, Exercise Physiologist    Medication changes reported      No    Fall or balance concerns reported     No    Tobacco Cessation  No Change    Warm-up and Cool-down  Performed as group-led instruction    Resistance Training Performed  Yes    VAD Patient?  No    PAD/SET Patient?  No      Pain Assessment   Currently in Pain?  No/denies    Pain Score  0-No pain    Multiple Pain Sites  No       Capillary Blood Glucose: No results found for this or any previous visit (from the past 24 hour(s)).    Social History   Tobacco Use  Smoking Status Former Smoker  . Packs/day: 1.00  . Years: 55.00  . Pack years: 55.00  . Types: Cigarettes  . Last attempt to quit: 04/22/2011  . Years since quitting: 7.3  Smokeless Tobacco Never Used  Tobacco Comment   occasionally smoked 2-3 times a week--1 cig each time    Goals Met:  Proper associated with RPD/PD & O2 Sat Exercise tolerated well Strength training completed today  Goals Unmet:  Not Applicable  Comments: Service time is from 1330 to 1500    Dr. Rush Farmer is Medical Director for Pulmonary Rehab at [facility].

## 2018-08-15 ENCOUNTER — Encounter (HOSPITAL_COMMUNITY)
Admission: RE | Admit: 2018-08-15 | Discharge: 2018-08-15 | Disposition: A | Discharge status: Home / Self Care | Payer: Medicare Other | Source: Ambulatory Visit | Attending: Pulmonary Disease | Admitting: Pulmonary Disease

## 2018-08-15 DIAGNOSIS — J432 Centrilobular emphysema: Secondary | ICD-10-CM

## 2018-08-15 NOTE — Progress Notes (Signed)
I have reviewed a Home Exercise Prescription with [name] . [name] is currently exercising at home.  The patient was advised to walk 2 days a week for 30 minutes.  [name] and I discussed how to progress their exercise prescription.  The patient stated that their goals were to be less short of breath.  The patient stated that they understand the exercise prescription.  We reviewed exercise guidelines, target heart rate during exercise, RPE Scale, weather conditions, NTG use, endpoints for exercise, warmup and cool down.  Patient is encouraged to come to me with any questions. I will continue to follow up with the patient to assist them with progression and safety.

## 2018-08-15 NOTE — Progress Notes (Signed)
Daily Session Note  Patient Details  Name: [name] MRN: [id number] Date of Birth: 1927-12-10 Referring Provider:     Pulmonary Rehab Walk Test from 07/11/2018 in [place]  Referring Provider  Dr. Halford Chessman      Encounter Date: 08/15/2018  Check In: Session Check In - 08/15/18 1557      Check-In   Supervising physician immediately available to respond to emergencies  [facility] immediately available    Physician(s)  Dr. Herbert Moors    Location  [facility]    Staff Present  Rosebud Poles, RN, BSN;Molly DiVincenzo, MS, ACSM RCEP, Exercise Physiologist;Dalton Kris Mouton, MS, Exercise Physiologist;Other    Medication changes reported      No    Fall or balance concerns reported     No    Tobacco Cessation  No Change    Warm-up and Cool-down  Performed as group-led instruction    Resistance Training Performed  Yes    VAD Patient?  No    PAD/SET Patient?  No      Pain Assessment   Currently in Pain?  No/denies    Pain Score  0-No pain    Multiple Pain Sites  No       Capillary Blood Glucose: No results found for this or any previous visit (from the past 24 hour(s)).    Social History   Tobacco Use  Smoking Status Former Smoker  . Packs/day: 1.00  . Years: 55.00  . Pack years: 55.00  . Types: Cigarettes  . Last attempt to quit: 04/22/2011  . Years since quitting: 7.3  Smokeless Tobacco Never Used  Tobacco Comment   occasionally smoked 2-3 times a week--1 cig each time    Goals Met:  Proper associated with RPD/PD & O2 Sat Exercise tolerated well Strength training completed today  Goals Unmet:  Not Applicable  Comments: Service time is from 1330 to 1500    Dr. Rush Farmer is Medical Director for Pulmonary Rehab at [facility].

## 2018-08-17 ENCOUNTER — Encounter (HOSPITAL_COMMUNITY)
Admission: RE | Admit: 2018-08-17 | Discharge: 2018-08-17 | Disposition: A | Discharge status: Home / Self Care | Payer: Medicare Other | Source: Ambulatory Visit | Attending: Pulmonary Disease | Admitting: Pulmonary Disease

## 2018-08-17 DIAGNOSIS — J432 Centrilobular emphysema: Secondary | ICD-10-CM | POA: Diagnosis not present

## 2018-08-17 NOTE — Progress Notes (Signed)
Daily Session Note  Patient Details  Name: [name] MRN: [id number] Date of Birth: [birth year] Referring Provider:     Pulmonary Rehab Walk Test from 07/11/2018 in [place]  Referring Provider  Dr. Halford Chessman      Encounter Date: 08/17/2018  Check In: Session Check In - 08/17/18 1349      Check-In   Supervising physician immediately available to respond to emergencies  [facility] immediately available    Physician(s)  Dr. Verlon Au    Location  [facility]    Staff Present  Rosebud Poles, RN, BSN;Molly DiVincenzo, MS, ACSM RCEP, Exercise Physiologist;Dalton Kris Mouton, MS, Exercise Physiologist;Lisa Ysidro Evert, RN    Medication changes reported      No    Fall or balance concerns reported     No    Tobacco Cessation  No Change    Warm-up and Cool-down  Performed as group-led instruction    Resistance Training Performed  Yes    VAD Patient?  No    PAD/SET Patient?  No      Pain Assessment   Currently in Pain?  No/denies    Multiple Pain Sites  No       Capillary Blood Glucose: No results found for this or any previous visit (from the past 24 hour(s)).    Social History   Tobacco Use  Smoking Status Former Smoker  . Packs/day: 1.00  . Years: 55.00  . Pack years: 55.00  . Types: Cigarettes  . Last attempt to quit: 04/22/2011  . Years since quitting: 7.3  Smokeless Tobacco Never Used  Tobacco Comment   occasionally smoked 2-3 times a week--1 cig each time    Goals Met:  Proper associated with RPD/PD & O2 Sat Exercise tolerated well Strength training completed today  Goals Unmet:  Not Applicable  Comments: Service time is from 1330 to 1510   Dr. Rush Farmer is Medical Director for Pulmonary Rehab at [facility].

## 2018-08-22 ENCOUNTER — Encounter (HOSPITAL_COMMUNITY)
Admission: RE | Admit: 2018-08-22 | Discharge: 2018-08-22 | Disposition: A | Discharge status: Home / Self Care | Payer: Medicare Other | Source: Ambulatory Visit | Attending: Pulmonary Disease | Admitting: Pulmonary Disease

## 2018-08-22 VITALS — Wt 174.2 lb

## 2018-08-22 DIAGNOSIS — J432 Centrilobular emphysema: Secondary | ICD-10-CM | POA: Diagnosis present

## 2018-08-22 NOTE — Progress Notes (Signed)
Daily Session Note  Patient Details  Name: [name] MRN: [id number] Date of Birth: 06/04/1928 Referring Provider:     Pulmonary Rehab Walk Test from 07/11/2018 in [place]  Referring Provider  Dr. Halford Chessman      Encounter Date: 08/22/2018  Check In: Session Check In - 08/22/18 1357      Check-In   Supervising physician immediately available to respond to emergencies  [facility] immediately available    Physician(s)  Dr. Maylene Roes    Location  [facility]    Staff Present  Joycelyn Man RN, Maxcine Ham, RN, Deland Pretty, MS, ACSM CEP, Exercise Physiologist;Lisa Ysidro Evert, RN    Medication changes reported      No    Fall or balance concerns reported     No    Tobacco Cessation  No Change    Warm-up and Cool-down  Performed as group-led instruction    Resistance Training Performed  Yes    VAD Patient?  No    PAD/SET Patient?  No      Pain Assessment   Currently in Pain?  No/denies    Pain Score  0-No pain    Multiple Pain Sites  No       Capillary Blood Glucose: No results found for this or any previous visit (from the past 24 hour(s)).  Exercise Prescription Changes - 08/22/18 1500      Response to Exercise   Blood Pressure (Admit)  140/76    Blood Pressure (Exercise)  116/60    Blood Pressure (Exit)  132/70    Heart Rate (Admit)  77 bpm    Heart Rate (Exercise)  103 bpm    Heart Rate (Exit)  93 bpm    Oxygen Saturation (Admit)  98 %    Oxygen Saturation (Exercise)  96 %    Oxygen Saturation (Exit)  98 %    Rating of Perceived Exertion (Exercise)  13    Perceived Dyspnea (Exercise)  2    Duration  Progress to 45 minutes of aerobic exercise without signs/symptoms of physical distress    Intensity  THRR unchanged      Progression   Progression  Continue to progress workloads to maintain intensity without signs/symptoms of physical distress.      Resistance Training   Training Prescription  Yes    Weight  orange bands    Reps  10-15    Time  10 Minutes      Recumbant Bike   Level  2    Minutes  17      NuStep   Level  3    SPM  80    Minutes  17    METs  2.4      Track   Laps  9    Minutes  17       Social History   Tobacco Use  Smoking Status Former Smoker  . Packs/day: 1.00  . Years: 55.00  . Pack years: 55.00  . Types: Cigarettes  . Last attempt to quit: 04/22/2011  . Years since quitting: 7.3  Smokeless Tobacco Never Used  Tobacco Comment   occasionally smoked 2-3 times a week--1 cig each time    Goals Met:  Proper associated with RPD/PD & O2 Sat Exercise tolerated well Strength training completed today  Goals Unmet:  Not Applicable  Comments:Service time is from 1330 to 1510    Dr. Rush Farmer is Medical Director for  Pulmonary Rehab at [facility].

## 2018-08-24 ENCOUNTER — Encounter (HOSPITAL_COMMUNITY)
Admission: RE | Admit: 2018-08-24 | Discharge: 2018-08-24 | Disposition: A | Discharge status: Home / Self Care | Payer: Medicare Other | Source: Ambulatory Visit | Attending: Pulmonary Disease | Admitting: Pulmonary Disease

## 2018-08-24 DIAGNOSIS — J432 Centrilobular emphysema: Secondary | ICD-10-CM | POA: Diagnosis not present

## 2018-08-24 NOTE — Progress Notes (Signed)
Pulmonary Individual Treatment Plan  Patient Details  Name: [name] MRN: [id number] Date of Birth: [birth year] Referring Provider:     Pulmonary Rehab Walk Test from 07/11/2018 in [place]  Referring Provider  Dr. Halford Chessman      Initial Encounter Date:    Pulmonary Rehab Walk Test from 07/11/2018 in [place]  Date  07/11/18      Visit Diagnosis: Centrilobular emphysema ([place])  Patient's Home Medications on Admission:   Current Outpatient Medications:  .  albuterol (PROVENTIL HFA;VENTOLIN HFA) 108 (90 BASE) MCG/ACT inhaler, Inhale 2 puffs into the lungs every 4 (four) hours as needed for wheezing or shortness of breath., Disp: , Rfl:  .  albuterol (PROVENTIL) (2.5 MG/3ML) 0.083% nebulizer solution, USE 1 VIAL IN NEBULIZER EVERY 6 HOURS. Generic: VENTOLIN (Patient taking differently: Take 2.5 mg by nebulization 3 (three) times daily. ), Disp: 120 vial, Rfl: 10 .  guaiFENesin (MUCINEX) 600 MG 12 hr tablet, Take 2 tablets (1,200 mg total) by mouth 2 (two) times daily as needed., Disp: , Rfl:  .  levothyroxine (SYNTHROID, LEVOTHROID) 50 MCG tablet, Take 50 mcg by mouth daily before breakfast. , Disp: , Rfl:  .  mometasone (ASMANEX) 220 MCG/INH inhaler, Inhale 2 puffs into the lungs daily., Disp: , Rfl:  .  predniSONE (DELTASONE) 5 MG tablet, TAKE 1 TABLET (5 MG TOTAL) BY MOUTH DAILY WITH BREAKFAST., Disp: 30 tablet, Rfl: 3 .  simvastatin (ZOCOR) 5 MG tablet, Take 5 mg by mouth at bedtime.  , Disp: , Rfl:  .  Tiotropium Bromide-Olodaterol (STIOLTO RESPIMAT) 2.5-2.5 MCG/ACT AERS, Inhale 2 puffs into the lungs daily., Disp: 1 Inhaler, Rfl: 6  Past Medical History: Past Medical History:  Diagnosis Date  . Arthritis    osteoarthritis  . Cancer ([place])    skin cancer only.-no melanoma.  Marland Kitchen COPD (chronic obstructive pulmonary disease) ([place])   . History of kidney stones    lithotripsy x1   . History of oxygen administration     Oxygen @ 2 l/m nasally bedtime  . Hyperlipidemia   . Hypothyroidism   . Pulmonary nodule, right   . 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 Tobacco abuse    Quit 09/17/10    Tobacco Use: Social History   Tobacco Use  Smoking Status Former Smoker  . Packs/day: 1.00  . Years: 55.00  . Pack years: 55.00  . Types: Cigarettes  . Last attempt to quit: 04/22/2011  . Years since quitting: 7.3  Smokeless Tobacco Never Used  Tobacco Comment   occasionally smoked 2-3 times a week--1 cig each time    Labs: Recent Review Flowsheet Data    Labs for ITP Cardiac and Pulmonary Rehab Latest Ref Rng & Units 12/07/2006 12/08/2006 12/11/2006 06/03/2014 05/20/2018   Cholestrol - - 114 ATP III CLASSIFICATION: <200     mg/dL   Desirable 200-239  mg/dL   Borderline High >=240    mg/dL   High - - -   LDLCALC - - 75 Total Cholesterol/HDL:CHD Risk Coronary Heart Disease Risk Table Men   Women 1/2 Average Risk   3.4   3.3 - - -   HDL - - 29(L) - - -   Trlycerides - - 48 - - -   Hemoglobin A1c <5.7 % - - - 6.5(H) -   PHART 7.350 - 7.450 - 7.433 7.430 - 7.371   PCO2ART 32.0 - 48.0 mmHg - 32.9(L)  38.0 - 43.3   HCO3 20.0 - 28.0 mmol/L 21.1 21.6 24.8(H) - 25.0   TCO2 22 - 32 mmol/L 22 22.6 25.9 - 26   ACIDBASEDEF - 2.0 2.0 - - -   O2SAT % - 93.8 94.5 - 97.0      Capillary Blood Glucose: Lab Results  Component Value Date   GLUCAP 103 (H) 05/23/2018     Pulmonary Assessment Scores: Pulmonary Assessment Scores    Row Name 07/07/18 1416         ADL UCSD   ADL Phase  Entry     SOB Score total  62       CAT Score   CAT Score  16        Pulmonary Function Assessment:   Exercise Target Goals: Exercise Program Goal: Individual exercise prescription set using results from initial 6 min walk test and THRR while considering  patient's activity barriers and safety.   Exercise Prescription Goal: Initial exercise prescription builds to 30-45 minutes a day of aerobic  activity, 2-3 days per week.  Home exercise guidelines will be given to patient during program as part of exercise prescription that the participant will acknowledge.  Activity Barriers & Risk Stratification: Activity Barriers & Cardiac Risk Stratification - 07/07/18 1407      Activity Barriers & Cardiac Risk Stratification   Activity Barriers  Left Hip Replacement;Arthritis;Shortness of Breath       6 Minute Walk: 6 Minute Walk    Row Name 07/11/18 1341         6 Minute Walk   Phase  Initial     Distance  800 feet     Walk Time  4.75 minutes     # of Rest Breaks  2 both seated. 1 foe 30 sec, 1 for 45 sec     MPH  1.51     METS  2.15     RPE  13     Perceived Dyspnea   3     Symptoms  No     Resting HR  76 bpm     Resting BP  132/64     Resting Oxygen Saturation   99 %     Exercise Oxygen Saturation  during 6 min walk  94 %     Max Ex. HR  108 bpm     Max Ex. BP  152/72     2 Minute Post BP  140/74       Interval HR   1 Minute HR  97     2 Minute HR  107     3 Minute HR  103     4 Minute HR  106     5 Minute HR  107     6 Minute HR  108     2 Minute Post HR  91     Interval Heart Rate?  Yes       Interval Oxygen   Interval Oxygen?  Yes     Baseline Oxygen Saturation %  99 %     1 Minute Oxygen Saturation %  95 %     1 Minute Liters of Oxygen  0 L     2 Minute Oxygen Saturation %  96 %     2 Minute Liters of Oxygen  0 L     3 Minute Oxygen Saturation %  95 %     3 Minute Liters of Oxygen  0 L  4 Minute Oxygen Saturation %  96 %     4 Minute Liters of Oxygen  0 L     5 Minute Oxygen Saturation %  95 %     5 Minute Liters of Oxygen  0 L     6 Minute Oxygen Saturation %  94 %     6 Minute Liters of Oxygen  0 L     2 Minute Post Oxygen Saturation %  99 %     2 Minute Post Liters of Oxygen  0 L        Oxygen Initial Assessment: Oxygen Initial Assessment - 07/24/18 1108      Home Oxygen   Home Oxygen Device  Home Concentrator;Portable Concentrator     Sleep Oxygen Prescription  Continuous    Liters per minute  2    Home Exercise Oxygen Prescription  None    Home at Rest Exercise Oxygen Prescription  None    Compliance with Home Oxygen Use  Yes      Program Oxygen Prescription   Program Oxygen Prescription  None      Intervention   Short Term Goals  To learn and exhibit compliance with exercise, home and travel O2 prescription;To learn and understand importance of monitoring SPO2 with pulse oximeter and demonstrate accurate use of the pulse oximeter.;To learn and demonstrate proper pursed lip breathing techniques or other breathing techniques.;To learn and understand importance of maintaining oxygen saturations>88%;To learn and demonstrate proper use of respiratory medications    Long  Term Goals  Exhibits compliance with exercise, home and travel O2 prescription;Verbalizes importance of monitoring SPO2 with pulse oximeter and return demonstration;Maintenance of O2 saturations>88%;Exhibits proper breathing techniques, such as pursed lip breathing or other method taught during program session;Compliance with respiratory medication;Demonstrates proper use of MDI's       Oxygen Re-Evaluation: Oxygen Re-Evaluation    Row Name 08/22/18 0720             Program Oxygen Prescription   Program Oxygen Prescription  None         Home Oxygen   Home Oxygen Device  Home Concentrator;Portable Concentrator       Sleep Oxygen Prescription  Continuous       Liters per minute  2       Home Exercise Oxygen Prescription  None       Home at Rest Exercise Oxygen Prescription  None       Compliance with Home Oxygen Use  Yes         Goals/Expected Outcomes   Short Term Goals  To learn and exhibit compliance with exercise, home and travel O2 prescription;To learn and understand importance of monitoring SPO2 with pulse oximeter and demonstrate accurate use of the pulse oximeter.;To learn and demonstrate proper pursed lip breathing techniques or other  breathing techniques.;To learn and understand importance of maintaining oxygen saturations>88%;To learn and demonstrate proper use of respiratory medications       Long  Term Goals  Exhibits compliance with exercise, home and travel O2 prescription;Verbalizes importance of monitoring SPO2 with pulse oximeter and return demonstration;Maintenance of O2 saturations>88%;Exhibits proper breathing techniques, such as pursed lip breathing or other method taught during program session;Compliance with respiratory medication;Demonstrates proper use of MDI's          Oxygen Discharge (Final Oxygen Re-Evaluation): Oxygen Re-Evaluation - 08/22/18 0720      Program Oxygen Prescription   Program Oxygen Prescription  None      Home Oxygen  Home Oxygen Device  Home Concentrator;Portable Concentrator    Sleep Oxygen Prescription  Continuous    Liters per minute  2    Home Exercise Oxygen Prescription  None    Home at Rest Exercise Oxygen Prescription  None    Compliance with Home Oxygen Use  Yes      Goals/Expected Outcomes   Short Term Goals  To learn and exhibit compliance with exercise, home and travel O2 prescription;To learn and understand importance of monitoring SPO2 with pulse oximeter and demonstrate accurate use of the pulse oximeter.;To learn and demonstrate proper pursed lip breathing techniques or other breathing techniques.;To learn and understand importance of maintaining oxygen saturations>88%;To learn and demonstrate proper use of respiratory medications    Long  Term Goals  Exhibits compliance with exercise, home and travel O2 prescription;Verbalizes importance of monitoring SPO2 with pulse oximeter and return demonstration;Maintenance of O2 saturations>88%;Exhibits proper breathing techniques, such as pursed lip breathing or other method taught during program session;Compliance with respiratory medication;Demonstrates proper use of MDI's       Initial Exercise Prescription: Initial  Exercise Prescription - 07/11/18 1300      Date of Initial Exercise RX and Referring Provider   Date  07/11/18    Referring Provider  Dr. Halford Chessman      Recumbant Bike   Level  1    Minutes  17      NuStep   Level  2    SPM  80    Minutes  17      Track   Laps  9    Minutes  17      Prescription Details   Frequency (times per week)  2    Duration  Progress to 45 minutes of aerobic exercise without signs/symptoms of physical distress      Intensity   THRR 40-80% of Max Heartrate  53-106    Ratings of Perceived Exertion  11-13    Perceived Dyspnea  0-4      Progression   Progression  Continue to progress workloads to maintain intensity without signs/symptoms of physical distress.      Resistance Training   Training Prescription  Yes    Weight  orange bands    Reps  10-15       Perform Capillary Blood Glucose checks as needed.  Exercise Prescription Changes: Exercise Prescription Changes    Row Name 07/25/18 1500 08/08/18 1600 08/22/18 1500         Response to Exercise   Blood Pressure (Admit)  136/74  128/66  140/76     Blood Pressure (Exercise)  150/74  158/80  116/60     Blood Pressure (Exit)  106/70  104/70  132/70     Heart Rate (Admit)  87 bpm  88 bpm  77 bpm     Heart Rate (Exercise)  108 bpm  109 bpm  103 bpm     Heart Rate (Exit)  97 bpm  86 bpm  93 bpm     Oxygen Saturation (Admit)  99 %  98 %  98 %     Oxygen Saturation (Exercise)  95 %  95 %  96 %     Oxygen Saturation (Exit)  98 %  100 %  98 %     Rating of Perceived Exertion (Exercise)  '14  13  13     ' Perceived Dyspnea (Exercise)  '4  3  2     ' Duration  Progress to 45 minutes  of aerobic exercise without signs/symptoms of physical distress  Progress to 45 minutes of aerobic exercise without signs/symptoms of physical distress  Progress to 45 minutes of aerobic exercise without signs/symptoms of physical distress     Intensity  - 40-80% HRR  THRR unchanged  THRR unchanged       Progression    Progression  Continue to progress workloads to maintain intensity without signs/symptoms of physical distress.  Continue to progress workloads to maintain intensity without signs/symptoms of physical distress.  Continue to progress workloads to maintain intensity without signs/symptoms of physical distress.       Resistance Training   Training Prescription  Yes  Yes  Yes     Weight  orange bands  orange bands  orange bands     Reps  10-15  10-15  10-15     Time  10 Minutes  10 Minutes  10 Minutes       Interval Training   Interval Training  No  No  -       Recumbant Bike   Level  '1  2  2     ' Minutes  '17  17  17       ' NuStep   Level  '2  3  3     ' SPM  80  80  80     Minutes  '17  17  17     ' METs  2.4  2.2  2.4       Track   Laps  '9  10  9     ' Minutes  '17  17  17        ' Exercise Comments:   Exercise Goals and Review: Exercise Goals    Row Name 07/07/18 1408             Exercise Goals   Increase Physical Activity  Yes       Intervention  Provide advice, education, support and counseling about physical activity/exercise needs.;Develop an individualized exercise prescription for aerobic and resistive training based on initial evaluation findings, risk stratification, comorbidities and participant's personal goals.       Expected Outcomes  Short Term: Attend rehab on a regular basis to increase amount of physical activity.       Increase Strength and Stamina  Yes       Intervention  Provide advice, education, support and counseling about physical activity/exercise needs.;Develop an individualized exercise prescription for aerobic and resistive training based on initial evaluation findings, risk stratification, comorbidities and participant's personal goals.       Expected Outcomes  Short Term: Increase workloads from initial exercise prescription for resistance, speed, and METs.       Able to understand and use rate of perceived exertion (RPE) scale  Yes       Intervention   Provide education and explanation on how to use RPE scale       Expected Outcomes  Short Term: Able to use RPE daily in rehab to express subjective intensity level;Long Term:  Able to use RPE to guide intensity level when exercising independently       Able to understand and use Dyspnea scale  Yes       Intervention  Provide education and explanation on how to use Dyspnea scale       Expected Outcomes  Short Term: Able to use Dyspnea scale daily in rehab to express subjective sense of shortness of breath during exertion;Long Term: Able to use Dyspnea scale  to guide intensity level when exercising independently       Knowledge and understanding of Target Heart Rate Range (THRR)  Yes       Intervention  Provide education and explanation of THRR including how the numbers were predicted and where they are located for reference       Expected Outcomes  Short Term: Able to state/look up THRR;Long Term: Able to use THRR to govern intensity when exercising independently;Short Term: Able to use daily as guideline for intensity in rehab       Understanding of Exercise Prescription  Yes       Intervention  Provide education, explanation, and written materials on patient's individual exercise prescription       Expected Outcomes  Short Term: Able to explain program exercise prescription;Long Term: Able to explain home exercise prescription to exercise independently          Exercise Goals Re-Evaluation : Exercise Goals Re-Evaluation    Row Name 07/24/18 1106 08/22/18 0718           Exercise Goal Re-Evaluation   Exercise Goals Review  Increase Physical Activity;Increase Strength and Stamina;Able to understand and use rate of perceived exertion (RPE) scale;Able to understand and use Dyspnea scale;Knowledge and understanding of Target Heart Rate Range (THRR);Understanding of Exercise Prescription  Increase Physical Activity;Increase Strength and Stamina;Able to understand and use rate of perceived exertion  (RPE) scale;Able to understand and use Dyspnea scale;Knowledge and understanding of Target Heart Rate Range (THRR);Understanding of Exercise Prescription      Comments  Patient has only completed 2 rehab sessions. His barriers are extreme shortness of breath and deconditioning. I will discuss with him the importance of home exercise. Will cont to monitor and progress as able.  Patient's barriers are extreme shortness of breath and deconditioning. He had increased his walking distance since his first session. MET average places him in the low catergory. I will discuss with him the importance of home exercise. Will cont to monitor and progress as able.      Expected Outcomes  Through exercise at rehab and at home, the patient will decrease shortness of breath with daily activities and feel confident in carrying out an exercise regime at home.   Through exercise at rehab and at home, the patient will decrease shortness of breath with daily activities and feel confident in carrying out an exercise regime at home.          Discharge Exercise Prescription (Final Exercise Prescription Changes): Exercise Prescription Changes - 08/22/18 1500      Response to Exercise   Blood Pressure (Admit)  140/76    Blood Pressure (Exercise)  116/60    Blood Pressure (Exit)  132/70    Heart Rate (Admit)  77 bpm    Heart Rate (Exercise)  103 bpm    Heart Rate (Exit)  93 bpm    Oxygen Saturation (Admit)  98 %    Oxygen Saturation (Exercise)  96 %    Oxygen Saturation (Exit)  98 %    Rating of Perceived Exertion (Exercise)  13    Perceived Dyspnea (Exercise)  2    Duration  Progress to 45 minutes of aerobic exercise without signs/symptoms of physical distress    Intensity  THRR unchanged      Progression   Progression  Continue to progress workloads to maintain intensity without signs/symptoms of physical distress.      Resistance Training   Training Prescription  Yes    Weight  orange bands    Reps  10-15     Time  10 Minutes      Recumbant Bike   Level  2    Minutes  17      NuStep   Level  3    SPM  80    Minutes  17    METs  2.4      Track   Laps  9    Minutes  17       Nutrition:  Target Goals: Understanding of nutrition guidelines, daily intake of sodium <1576m, cholesterol <2070m calories 30% from fat and 7% or less from saturated fats, daily to have 5 or more servings of fruits and vegetables.  Biometrics: Pre Biometrics - 07/07/18 1410      Pre Biometrics   Grip Strength  35 kg        Nutrition Therapy Plan and Nutrition Goals: Nutrition Therapy & Goals - 07/25/18 1527      Nutrition Therapy   Diet  general healthful      Personal Nutrition Goals   Nutrition Goal  Pt to identify and consume food sources higher in protein and higher in calories    Personal Goal #2  The pt will recognize symptoms that can interfere with adequate oral intake    Personal Goal #3  The pt will consume high-energy, high-nutrient dense beverages when necessary to compensate for decreased oral intake of solid foods      Intervention Plan   Intervention  Prescribe, educate and counsel regarding individualized specific dietary modifications aiming towards targeted core components such as weight, hypertension, lipid management, diabetes, heart failure and other comorbidities.    Expected Outcomes  Short Term Goal: Understand basic principles of dietary content, such as calories, fat, sodium, cholesterol and nutrients.;Long Term Goal: Adherence to prescribed nutrition plan.       Nutrition Assessments: Nutrition Assessments - 07/13/18 1526      Rate Your Plate Scores   Pre Score  50       Nutrition Goals Re-Evaluation: Nutrition Goals Re-Evaluation    RoLindename 07/13/18 1526             Goals   Current Weight  173 lb 4.5 oz (78.6 kg)       Nutrition Goal  to be determined          Nutrition Goals Discharge (Final Nutrition Goals Re-Evaluation): Nutrition Goals  Re-Evaluation - 07/13/18 1526      Goals   Current Weight  173 lb 4.5 oz (78.6 kg)    Nutrition Goal  to be determined       Psychosocial: Target Goals: Acknowledge presence or absence of significant depression and/or stress, maximize coping skills, provide positive support system. Participant is able to verbalize types and ability to use techniques and skills needed for reducing stress and depression.  Initial Review & Psychosocial Screening: Initial Psych Review & Screening - 07/07/18 1416      Initial Review   Current issues with  None Identified      Family Dynamics   Good Support System?  Yes      Barriers   Psychosocial barriers to participate in program  There are no identifiable barriers or psychosocial needs.      Screening Interventions   Interventions  Encouraged to exercise    Expected Outcomes  Short Term goal: Identification and review with participant of any Quality of Life or Depression concerns found by scoring the questionnaire.;Long Term  goal: The participant improves quality of Life and PHQ9 Scores as seen by post scores and/or verbalization of changes       Quality of Life Scores:  Scores of 19 and below usually indicate a poorer quality of life in these areas.  A difference of  2-3 points is a clinically meaningful difference.  A difference of 2-3 points in the total score of the Quality of Life Index has been associated with significant improvement in overall quality of life, self-image, physical symptoms, and general health in studies assessing change in quality of life.  PHQ-9: Recent Review Flowsheet Data    Depression screen [facility] 2/9 07/07/2018 10/03/2017 01/05/2017   Decreased Interest 0 0 0   Down, Depressed, Hopeless 0 0 0   PHQ - 2 Score 0 0 0   Altered sleeping 0 - -   Tired, decreased energy 0 - -   Change in appetite 0 - -   Feeling bad or failure about yourself  0 - -   Trouble concentrating 0 - -   Moving slowly or fidgety/restless 0 - -    Suicidal thoughts 0 - -   PHQ-9 Score 0 - -   Difficult doing work/chores Not difficult at all - -     Interpretation of Total Score  Total Score Depression Severity:  1-4 = Minimal depression, 5-9 = Mild depression, 10-14 = Moderate depression, 15-19 = Moderately severe depression, 20-27 = Severe depression   Psychosocial Evaluation and Intervention: Psychosocial Evaluation - 07/07/18 1521      Psychosocial Evaluation & Interventions   Interventions  Stress management education;Relaxation education;Encouraged to exercise with the program and follow exercise prescription    Comments  Pt states he has no stressors in his life. He has a wife, who he states will be starting cardiac rehab later this month, who is a good support system for him a well as 4 children. Children do not live in the area, but he maintains contact.    Expected Outcomes  Pt will continue to have a positive and healthy outlook    Continue Psychosocial Services   Follow up required by staff       Psychosocial Re-Evaluation: Psychosocial Re-Evaluation    [place] Name 07/25/18 1641 08/22/18 1018           Psychosocial Re-Evaluation   Current issues with  None Identified  None Identified      Comments  No psychosocial barriers  No psychosocial barriers      Expected Outcomes  patient remained free from psychosocial barriers to participation in pulmonary rehab  patient remained free from psychosocial barriers to participation in pulmonary rehab      Interventions  Encouraged to attend Pulmonary Rehabilitation for the exercise  Encouraged to attend Pulmonary Rehabilitation for the exercise      Continue Psychosocial Services   No Follow up required  No Follow up required         Psychosocial Discharge (Final Psychosocial Re-Evaluation): Psychosocial Re-Evaluation - 08/22/18 1018      Psychosocial Re-Evaluation   Current issues with  None Identified    Comments  No psychosocial barriers    Expected Outcomes  patient  remained free from psychosocial barriers to participation in pulmonary rehab    Interventions  Encouraged to attend Pulmonary Rehabilitation for the exercise    Continue Psychosocial Services   No Follow up required       Education: Education Goals: Education classes will be provided on a weekly  basis, covering required topics. Participant will state understanding/return demonstration of topics presented.  Learning Barriers/Preferences: Learning Barriers/Preferences - 07/07/18 1420      Learning Barriers/Preferences   Learning Barriers  Sight    Learning Preferences  Verbal Instruction;Skilled Demonstration;Individual Instruction;Group Instruction;Computer/Internet       Education Topics: Risk Factor Reduction:  -Group instruction that is supported by a PowerPoint presentation. Instructor discusses the definition of a risk factor, different risk factors for pulmonary disease, and how the heart and lungs work together.     Nutrition for Pulmonary Patient:  -Group instruction provided by PowerPoint slides, verbal discussion, and written materials to support subject matter. The instructor gives an explanation and review of healthy diet recommendations, which includes a discussion on weight management, recommendations for fruit and vegetable consumption, as well as protein, fluid, caffeine, fiber, sodium, sugar, and alcohol. Tips for eating when patients are short of breath are discussed.   Pursed Lip Breathing:  -Group instruction that is supported by demonstration and informational handouts. Instructor discusses the benefits of pursed lip and diaphragmatic breathing and detailed demonstration on how to preform both.     Oxygen Safety:  -Group instruction provided by PowerPoint, verbal discussion, and written material to support subject matter. There is an overview of "What is Oxygen" and "Why do we need it".  Instructor also reviews how to create a safe environment for oxygen use, the  importance of using oxygen as prescribed, and the risks of noncompliance. There is a brief discussion on traveling with oxygen and resources the patient may utilize.   PULMONARY REHAB OTHER RESPIRATORY from 08/17/2018 in [place]  Date  07/27/18  Educator  Cloyde Reams  Instruction Review Code  1- Verbalizes Understanding      Oxygen Equipment:  -Group instruction provided by [employer] utilizing handouts, written materials, and Insurance underwriter.   PULMONARY REHAB OTHER RESPIRATORY from 08/17/2018 in [place]  Date  08/03/18  Educator  Ace Gins  Instruction Review Code  1- Verbalizes Understanding      Signs and Symptoms:  -Group instruction provided by written material and verbal discussion to support subject matter. Warning signs and symptoms of infection, stroke, and heart attack are reviewed and when to call the physician/911 reinforced. Tips for preventing the spread of infection discussed.   PULMONARY REHAB OTHER RESPIRATORY from 08/17/2018 in [place]  Date  08/17/18  Educator  Remo Lipps  Instruction Review Code  2- Demonstrated Understanding      Advanced Directives:  -Group instruction provided by verbal instruction and written material to support subject matter. Instructor reviews Advanced Directive laws and proper instruction for filling out document.   Pulmonary Video:  -Group video education that reviews the importance of medication and oxygen compliance, exercise, good nutrition, pulmonary hygiene, and pursed lip and diaphragmatic breathing for the pulmonary patient.   Exercise for the Pulmonary Patient:  -Group instruction that is supported by a PowerPoint presentation. Instructor discusses benefits of exercise, core components of exercise, frequency, duration, and intensity of an exercise routine, importance of utilizing pulse oximetry during exercise, safety while  exercising, and options of places to exercise outside of rehab.     PULMONARY REHAB OTHER RESPIRATORY from 08/17/2018 in [place]  Date  08/10/18  Educator  Cloyde Reams  Instruction Review Code  1- Verbalizes Understanding      Pulmonary Medications:  -Verbally interactive group education provided by instructor with focus  on inhaled medications and proper administration.   PULMONARY REHAB OTHER RESPIRATORY from 12/15/2017 in [place]  Date  12/08/17  Educator  pharmacy  Instruction Review Code  1- Verbalizes Understanding      Anatomy and Physiology of the Respiratory System and Intimacy:  -Group instruction provided by PowerPoint, verbal discussion, and written material to support subject matter. Instructor reviews respiratory cycle and anatomical components of the respiratory system and their functions. Instructor also reviews differences in obstructive and restrictive respiratory diseases with examples of each. Intimacy, Sex, and Sexuality differences are reviewed with a discussion on how relationships can change when diagnosed with pulmonary disease. Common sexual concerns are reviewed.   PULMONARY REHAB OTHER RESPIRATORY from 08/17/2018 in [place]  Date  07/20/18  Educator  RN  Instruction Review Code  1- Verbalizes Understanding      MD DAY -A group question and answer session with a medical doctor that allows participants to ask questions that relate to their pulmonary disease state.   PULMONARY REHAB OTHER RESPIRATORY from 12/15/2017 in [place]  Date  11/24/17  Educator  Dr.Yacoub  Instruction Review Code  1- Verbalizes Understanding      OTHER EDUCATION -Group or individual verbal, written, or video instructions that support the educational goals of the pulmonary rehab program.   PULMONARY REHAB OTHER RESPIRATORY from 12/15/2017 in [place]  Date  12/15/17  Educator  [place]  Instruction Review Code  1- Verbalizes Understanding      Holiday Eating Survival Tips:  -Group instruction provided by PowerPoint slides, verbal discussion, and written materials to support subject matter. The instructor gives patients tips, tricks, and techniques to help them not only survive but enjoy the holidays despite the onslaught of food that accompanies the holidays.   Knowledge Questionnaire Score: Knowledge Questionnaire Score - 07/07/18 1421      Knowledge Questionnaire Score   Pre Score  18/18       Core Components/Risk Factors/Patient Goals at Admission: Personal Goals and Risk Factors at Admission - 07/07/18 1421      Core Components/Risk Factors/Patient Goals on Admission    Weight Management  Weight Maintenance    Improve shortness of breath with ADL's  Yes    Intervention  Provide education, individualized exercise plan and daily activity instruction to help decrease symptoms of SOB with activities of daily living.    Expected Outcomes  Short Term: Improve cardiorespiratory fitness to achieve a reduction of symptoms when performing ADLs;Long Term: Be able to perform more ADLs without symptoms or delay the onset of symptoms    Hypertension  Yes    Intervention  Provide education on lifestyle modifcations including regular physical activity/exercise, weight management, moderate sodium restriction and increased consumption of fresh fruit, vegetables, and low fat dairy, alcohol moderation, and smoking cessation.;Monitor prescription use compliance.    Expected Outcomes  Short Term: Continued assessment and intervention until BP is < 140/77m HG in hypertensive participants. < 130/8105mHG in hypertensive participants with diabetes, heart failure or chronic kidney disease.;Long Term: Maintenance of blood pressure at goal levels.    Lipids  Yes    Intervention   Provide education and support for participant on nutrition & aerobic/resistive exercise along with prescribed medications to achieve LDL <7093mHDL >23m42m  Expected Outcomes  Short Term: Participant states understanding of desired cholesterol values and is  compliant with medications prescribed. Participant is following exercise prescription and nutrition guidelines.;Long Term: Cholesterol controlled with medications as prescribed, with individualized exercise RX and with personalized nutrition plan. Value goals: LDL < 70m, HDL > 40 mg.       Core Components/Risk Factors/Patient Goals Review:  Goals and Risk Factor Review    Row Name 07/07/18 1422 07/25/18 1641 08/22/18 1019         Core Components/Risk Factors/Patient Goals Review   Personal Goals Review  Improve shortness of breath with ADL's;Increase knowledge of respiratory medications and ability to use respiratory devices properly.;Develop more efficient breathing techniques such as purse lipped breathing and diaphragmatic breathing and practicing self-pacing with activity.;Hypertension  Improve shortness of breath with ADL's;Increase knowledge of respiratory medications and ability to use respiratory devices properly.;Develop more efficient breathing techniques such as purse lipped breathing and diaphragmatic breathing and practicing self-pacing with activity.;Hypertension  Improve shortness of breath with ADL's;Increase knowledge of respiratory medications and ability to use respiratory devices properly.;Develop more efficient breathing techniques such as purse lipped breathing and diaphragmatic breathing and practicing self-pacing with activity.;Hypertension     Review  -  Just started the program, has only attended 3 exercise sessions, too early to have met any goals. Hypertension is controlled.  Progressing well in the program, workloads are increasing     Expected Outcomes  -  see admission expected outcomes.  see admission expected  outcomes.        Core Components/Risk Factors/Patient Goals at Discharge (Final Review):  Goals and Risk Factor Review - 08/22/18 1019      Core Components/Risk Factors/Patient Goals Review   Personal Goals Review  Improve shortness of breath with ADL's;Increase knowledge of respiratory medications and ability to use respiratory devices properly.;Develop more efficient breathing techniques such as purse lipped breathing and diaphragmatic breathing and practicing self-pacing with activity.;Hypertension    Review  Progressing well in the program, workloads are increasing    Expected Outcomes  see admission expected outcomes.       ITP Comments: ITP Comments    Row Name 07/07/18 1350           ITP Comments  Dr. WJennet Maduro Medical Director, Pulmonary Rehab          Comments: ITP REVIEW Pt is making expected progress toward pulmonary rehab goals after completing 11 sessions. Recommend continued exercise, life style modification, education, and utilization of breathing techniques to increase stamina and strength and decrease shortness of breath with exertion.

## 2018-08-24 NOTE — Progress Notes (Signed)
Daily Session Note  Patient Details  Name: [name] MRN: [id number] Date of Birth: Dec 27, 1927 Referring Provider:     Pulmonary Rehab Walk Test from 07/11/2018 in [place]  Referring Provider  Dr. Halford Chessman      Encounter Date: 08/24/2018  Check In: Session Check In - 08/24/18 1330      Check-In   Supervising physician immediately available to respond to emergencies  [facility] immediately available    Physician(s)  Dr. Wynelle Cleveland    Location  [facility]    Staff Present  Joycelyn Man RN, BSN;Gilverto Dileonardo, MS, ACSM RCEP, Exercise Physiologist;Dalton Kris Mouton, MS, Exercise Physiologist;Joan Leonia Reeves, RN, Roque Cash, RN    Medication changes reported      No    Fall or balance concerns reported     No    Tobacco Cessation  No Change    Warm-up and Cool-down  Performed as group-led instruction    Resistance Training Performed  Yes    VAD Patient?  No    PAD/SET Patient?  No      Pain Assessment   Currently in Pain?  No/denies    Multiple Pain Sites  No       Capillary Blood Glucose: No results found for this or any previous visit (from the past 24 hour(s)).    Social History   Tobacco Use  Smoking Status Former Smoker  . Packs/day: 1.00  . Years: 55.00  . Pack years: 55.00  . Types: Cigarettes  . Last attempt to quit: 04/22/2011  . Years since quitting: 7.3  Smokeless Tobacco Never Used  Tobacco Comment   occasionally smoked 2-3 times a week--1 cig each time    Goals Met:  Exercise tolerated well  Goals Unmet:  Not Applicable  Comments: Service time is from 1:30p to 3:00p    Dr. Rush Farmer is Medical Director for Pulmonary Rehab at [facility].

## 2018-08-29 ENCOUNTER — Encounter (HOSPITAL_COMMUNITY)
Admission: RE | Admit: 2018-08-29 | Discharge: 2018-08-29 | Disposition: A | Discharge status: Home / Self Care | Payer: Medicare Other | Source: Ambulatory Visit | Attending: Pulmonary Disease | Admitting: Pulmonary Disease

## 2018-08-29 DIAGNOSIS — J432 Centrilobular emphysema: Secondary | ICD-10-CM

## 2018-08-29 NOTE — Progress Notes (Signed)
Daily Session Note  Patient Details  Name: [name] MRN: [id number] Date of Birth: 1928-01-04 Referring Provider:     Pulmonary Rehab Walk Test from 07/11/2018 in [place]  Referring Provider  Dr. Halford Chessman      Encounter Date: 08/29/2018  Check In: Session Check In - 08/29/18 1351      Check-In   Supervising physician immediately available to respond to emergencies  [facility] immediately available    Physician(s)  Dr. Jonnie Finner    Staff Present  Hoy Register, MS, Exercise Physiologist;Lisa Ysidro Evert, RN;Molly DiVincenzo, MS, ACSM RCEP, Exercise Physiologist;Ediberto Sens Leonia Reeves, RN, BSN    Medication changes reported      No    Fall or balance concerns reported     No    Tobacco Cessation  No Change    Warm-up and Cool-down  Performed as group-led instruction    Resistance Training Performed  Yes    VAD Patient?  No    PAD/SET Patient?  No      Pain Assessment   Currently in Pain?  No/denies    Pain Score  0-No pain    Multiple Pain Sites  No       Capillary Blood Glucose: No results found for this or any previous visit (from the past 24 hour(s)).    Social History   Tobacco Use  Smoking Status Former Smoker  . Packs/day: 1.00  . Years: 55.00  . Pack years: 55.00  . Types: Cigarettes  . Last attempt to quit: 04/22/2011  . Years since quitting: 7.3  Smokeless Tobacco Never Used  Tobacco Comment   occasionally smoked 2-3 times a week--1 cig each time    Goals Met:  Proper associated with RPD/PD & O2 Sat Exercise tolerated well Strength training completed today  Goals Unmet:  Not Applicable  Comments: Service time is from 1330 to [place]    Dr. Rush Farmer is Medical Director for Pulmonary Rehab at [facility].

## 2018-08-31 ENCOUNTER — Encounter (HOSPITAL_COMMUNITY)
Admission: RE | Admit: 2018-08-31 | Discharge: 2018-08-31 | Disposition: A | Discharge status: Home / Self Care | Payer: Medicare Other | Source: Ambulatory Visit | Attending: Pulmonary Disease | Admitting: Pulmonary Disease

## 2018-08-31 ENCOUNTER — Other Ambulatory Visit: Payer: Self-pay

## 2018-08-31 VITALS — Wt 176.1 lb

## 2018-08-31 DIAGNOSIS — J432 Centrilobular emphysema: Secondary | ICD-10-CM

## 2018-08-31 NOTE — Progress Notes (Signed)
Daily Session Note  Patient Details  Name: [name] MRN: [id number] Date of Birth: [birth year] Referring Provider:     Pulmonary Rehab Walk Test from 07/11/2018 in [place]  Referring Provider  Dr. Halford Chessman      Encounter Date: 08/31/2018  Check In: Session Check In - 08/31/18 1325      Check-In   Supervising physician immediately available to respond to emergencies  [facility] immediately available    Physician(s)  Dr. Cathlean Sauer    Location  [facility]    Staff Present  Joycelyn Man RN, BSN;Semaje Kinker, MS, ACSM RCEP, Exercise Physiologist;Dalton Kris Mouton, MS, Exercise Physiologist;Lisa Ysidro Evert, RN    Medication changes reported      No    Fall or balance concerns reported     No    Tobacco Cessation  No Change    Warm-up and Cool-down  Performed as group-led instruction    Resistance Training Performed  Yes    VAD Patient?  No    PAD/SET Patient?  No      Pain Assessment   Currently in Pain?  No/denies    Pain Score  0-No pain    Multiple Pain Sites  No       Capillary Blood Glucose: No results found for this or any previous visit (from the past 24 hour(s)).    Social History   Tobacco Use  Smoking Status Former Smoker  . Packs/day: 1.00  . Years: 55.00  . Pack years: 55.00  . Types: Cigarettes  . Last attempt to quit: 04/22/2011  . Years since quitting: 7.3  Smokeless Tobacco Never Used  Tobacco Comment   occasionally smoked 2-3 times a week--1 cig each time    Goals Met:  Exercise tolerated well  Goals Unmet:  Not Applicable  Comments: Service time is from 1:30p to 3:15p     Dr. Rush Farmer is Medical Director for Pulmonary Rehab at [facility].

## 2018-09-05 ENCOUNTER — Encounter (HOSPITAL_COMMUNITY): Payer: Medicare Other

## 2018-09-07 ENCOUNTER — Encounter (HOSPITAL_COMMUNITY): Payer: Medicare Other

## 2018-09-11 ENCOUNTER — Telehealth (HOSPITAL_COMMUNITY): Payer: Self-pay

## 2018-09-12 ENCOUNTER — Encounter (HOSPITAL_COMMUNITY): Payer: Medicare Other

## 2018-09-12 NOTE — Progress Notes (Signed)
Pulmonary Individual Treatment Plan  Patient Details  Name: [name] MRN: [id number] Date of Birth: [birth year] Referring Provider:     Pulmonary Rehab Walk Test from 07/11/2018 in [place]  Referring Provider  Dr. Halford Chessman      Initial Encounter Date:    Pulmonary Rehab Walk Test from 07/11/2018 in [place]  Date  07/11/18      Visit Diagnosis: Centrilobular emphysema ([place])  Patient's Home Medications on Admission:   Current Outpatient Medications:  .  albuterol (PROVENTIL HFA;VENTOLIN HFA) 108 (90 BASE) MCG/ACT inhaler, Inhale 2 puffs into the lungs every 4 (four) hours as needed for wheezing or shortness of breath., Disp: , Rfl:  .  albuterol (PROVENTIL) (2.5 MG/3ML) 0.083% nebulizer solution, USE 1 VIAL IN NEBULIZER EVERY 6 HOURS. Generic: VENTOLIN (Patient taking differently: Take 2.5 mg by nebulization 3 (three) times daily. ), Disp: 120 vial, Rfl: 10 .  guaiFENesin (MUCINEX) 600 MG 12 hr tablet, Take 2 tablets (1,200 mg total) by mouth 2 (two) times daily as needed., Disp: , Rfl:  .  levothyroxine (SYNTHROID, LEVOTHROID) 50 MCG tablet, Take 50 mcg by mouth daily before breakfast. , Disp: , Rfl:  .  mometasone (ASMANEX) 220 MCG/INH inhaler, Inhale 2 puffs into the lungs daily., Disp: , Rfl:  .  predniSONE (DELTASONE) 5 MG tablet, TAKE 1 TABLET (5 MG TOTAL) BY MOUTH DAILY WITH BREAKFAST., Disp: 30 tablet, Rfl: 3 .  simvastatin (ZOCOR) 5 MG tablet, Take 5 mg by mouth at bedtime.  , Disp: , Rfl:  .  Tiotropium Bromide-Olodaterol (STIOLTO RESPIMAT) 2.5-2.5 MCG/ACT AERS, Inhale 2 puffs into the lungs daily., Disp: 1 Inhaler, Rfl: 6  Past Medical History: Past Medical History:  Diagnosis Date  . Arthritis    osteoarthritis  . Cancer ([place])    skin cancer only.-no melanoma.  Marland Kitchen COPD (chronic obstructive pulmonary disease) ([place])   . History of kidney stones    lithotripsy x1   . History of oxygen administration     Oxygen @ 2 l/m nasally bedtime  . Hyperlipidemia   . Hypothyroidism   . Pulmonary nodule, right   . 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 Tobacco abuse    Quit 09/17/10    Tobacco Use: Social History   Tobacco Use  Smoking Status Former Smoker  . Packs/day: 1.00  . Years: 55.00  . Pack years: 55.00  . Types: Cigarettes  . Last attempt to quit: 04/22/2011  . Years since quitting: 7.3  Smokeless Tobacco Never Used  Tobacco Comment   occasionally smoked 2-3 times a week--1 cig each time    Labs: Recent Review Flowsheet Data    Labs for ITP Cardiac and Pulmonary Rehab Latest Ref Rng & Units 12/07/2006 12/08/2006 12/11/2006 06/03/2014 05/20/2018   Cholestrol - - 114 ATP III CLASSIFICATION: <200     mg/dL   Desirable 200-239  mg/dL   Borderline High >=240    mg/dL   High - - -   LDLCALC - - 75 Total Cholesterol/HDL:CHD Risk Coronary Heart Disease Risk Table Men   Women 1/2 Average Risk   3.4   3.3 - - -   HDL - - 29(L) - - -   Trlycerides - - 48 - - -   Hemoglobin A1c <5.7 % - - - 6.5(H) -   PHART 7.350 - 7.450 - 7.433 7.430 - 7.371   PCO2ART 32.0 - 48.0 mmHg - 32.9(L)  38.0 - 43.3   HCO3 20.0 - 28.0 mmol/L 21.1 21.6 24.8(H) - 25.0   TCO2 22 - 32 mmol/L 22 22.6 25.9 - 26   ACIDBASEDEF - 2.0 2.0 - - -   O2SAT % - 93.8 94.5 - 97.0      Capillary Blood Glucose: Lab Results  Component Value Date   GLUCAP 103 (H) 05/23/2018     Pulmonary Assessment Scores: Pulmonary Assessment Scores    Row Name 07/07/18 1416         ADL UCSD   ADL Phase  Entry     SOB Score total  62       CAT Score   CAT Score  16        Pulmonary Function Assessment:   Exercise Target Goals: Exercise Program Goal: Individual exercise prescription set using results from initial 6 min walk test and THRR while considering  patient's activity barriers and safety.   Exercise Prescription Goal: Initial exercise prescription builds to 30-45 minutes a day of aerobic  activity, 2-3 days per week.  Home exercise guidelines will be given to patient during program as part of exercise prescription that the participant will acknowledge.  Activity Barriers & Risk Stratification: Activity Barriers & Cardiac Risk Stratification - 07/07/18 1407      Activity Barriers & Cardiac Risk Stratification   Activity Barriers  Left Hip Replacement;Arthritis;Shortness of Breath       6 Minute Walk: 6 Minute Walk    Row Name 07/11/18 1341         6 Minute Walk   Phase  Initial     Distance  800 feet     Walk Time  4.75 minutes     # of Rest Breaks  2 both seated. 1 foe 30 sec, 1 for 45 sec     MPH  1.51     METS  2.15     RPE  13     Perceived Dyspnea   3     Symptoms  No     Resting HR  76 bpm     Resting BP  132/64     Resting Oxygen Saturation   99 %     Exercise Oxygen Saturation  during 6 min walk  94 %     Max Ex. HR  108 bpm     Max Ex. BP  152/72     2 Minute Post BP  140/74       Interval HR   1 Minute HR  97     2 Minute HR  107     3 Minute HR  103     4 Minute HR  106     5 Minute HR  107     6 Minute HR  108     2 Minute Post HR  91     Interval Heart Rate?  Yes       Interval Oxygen   Interval Oxygen?  Yes     Baseline Oxygen Saturation %  99 %     1 Minute Oxygen Saturation %  95 %     1 Minute Liters of Oxygen  0 L     2 Minute Oxygen Saturation %  96 %     2 Minute Liters of Oxygen  0 L     3 Minute Oxygen Saturation %  95 %     3 Minute Liters of Oxygen  0 L  4 Minute Oxygen Saturation %  96 %     4 Minute Liters of Oxygen  0 L     5 Minute Oxygen Saturation %  95 %     5 Minute Liters of Oxygen  0 L     6 Minute Oxygen Saturation %  94 %     6 Minute Liters of Oxygen  0 L     2 Minute Post Oxygen Saturation %  99 %     2 Minute Post Liters of Oxygen  0 L        Oxygen Initial Assessment: Oxygen Initial Assessment - 07/24/18 1108      Home Oxygen   Home Oxygen Device  Home Concentrator;Portable Concentrator     Sleep Oxygen Prescription  Continuous    Liters per minute  2    Home Exercise Oxygen Prescription  None    Home at Rest Exercise Oxygen Prescription  None    Compliance with Home Oxygen Use  Yes      Program Oxygen Prescription   Program Oxygen Prescription  None      Intervention   Short Term Goals  To learn and exhibit compliance with exercise, home and travel O2 prescription;To learn and understand importance of monitoring SPO2 with pulse oximeter and demonstrate accurate use of the pulse oximeter.;To learn and demonstrate proper pursed lip breathing techniques or other breathing techniques.;To learn and understand importance of maintaining oxygen saturations>88%;To learn and demonstrate proper use of respiratory medications    Long  Term Goals  Exhibits compliance with exercise, home and travel O2 prescription;Verbalizes importance of monitoring SPO2 with pulse oximeter and return demonstration;Maintenance of O2 saturations>88%;Exhibits proper breathing techniques, such as pursed lip breathing or other method taught during program session;Compliance with respiratory medication;Demonstrates proper use of MDI's       Oxygen Re-Evaluation: Oxygen Re-Evaluation    Row Name 08/22/18 0720 09/11/18 0738           Program Oxygen Prescription   Program Oxygen Prescription  None  None        Home Oxygen   Home Oxygen Device  Home Concentrator;Portable Concentrator  Home Concentrator;Portable Concentrator      Sleep Oxygen Prescription  Continuous  Continuous      Liters per minute  2  2      Home Exercise Oxygen Prescription  None  None      Home at Rest Exercise Oxygen Prescription  None  None      Compliance with Home Oxygen Use  Yes  Yes        Goals/Expected Outcomes   Short Term Goals  To learn and exhibit compliance with exercise, home and travel O2 prescription;To learn and understand importance of monitoring SPO2 with pulse oximeter and demonstrate accurate use of the pulse  oximeter.;To learn and demonstrate proper pursed lip breathing techniques or other breathing techniques.;To learn and understand importance of maintaining oxygen saturations>88%;To learn and demonstrate proper use of respiratory medications  To learn and exhibit compliance with exercise, home and travel O2 prescription;To learn and understand importance of monitoring SPO2 with pulse oximeter and demonstrate accurate use of the pulse oximeter.;To learn and demonstrate proper pursed lip breathing techniques or other breathing techniques.;To learn and understand importance of maintaining oxygen saturations>88%;To learn and demonstrate proper use of respiratory medications      Long  Term Goals  Exhibits compliance with exercise, home and travel O2 prescription;Verbalizes importance of monitoring SPO2 with pulse oximeter and return  demonstration;Maintenance of O2 saturations>88%;Exhibits proper breathing techniques, such as pursed lip breathing or other method taught during program session;Compliance with respiratory medication;Demonstrates proper use of MDI's  Exhibits compliance with exercise, home and travel O2 prescription;Verbalizes importance of monitoring SPO2 with pulse oximeter and return demonstration;Maintenance of O2 saturations>88%;Exhibits proper breathing techniques, such as pursed lip breathing or other method taught during program session;Compliance with respiratory medication;Demonstrates proper use of MDI's      Goals/Expected Outcomes  -  Patient is compliant         Oxygen Discharge (Final Oxygen Re-Evaluation): Oxygen Re-Evaluation - 09/11/18 0738      Program Oxygen Prescription   Program Oxygen Prescription  None      Home Oxygen   Home Oxygen Device  Home Concentrator;Portable Concentrator    Sleep Oxygen Prescription  Continuous    Liters per minute  2    Home Exercise Oxygen Prescription  None    Home at Rest Exercise Oxygen Prescription  None    Compliance with Home  Oxygen Use  Yes      Goals/Expected Outcomes   Short Term Goals  To learn and exhibit compliance with exercise, home and travel O2 prescription;To learn and understand importance of monitoring SPO2 with pulse oximeter and demonstrate accurate use of the pulse oximeter.;To learn and demonstrate proper pursed lip breathing techniques or other breathing techniques.;To learn and understand importance of maintaining oxygen saturations>88%;To learn and demonstrate proper use of respiratory medications    Long  Term Goals  Exhibits compliance with exercise, home and travel O2 prescription;Verbalizes importance of monitoring SPO2 with pulse oximeter and return demonstration;Maintenance of O2 saturations>88%;Exhibits proper breathing techniques, such as pursed lip breathing or other method taught during program session;Compliance with respiratory medication;Demonstrates proper use of MDI's    Goals/Expected Outcomes  Patient is compliant       Initial Exercise Prescription: Initial Exercise Prescription - 07/11/18 1300      Date of Initial Exercise RX and Referring Provider   Date  07/11/18    Referring Provider  Dr. Halford Chessman      Recumbant Bike   Level  1    Minutes  17      NuStep   Level  2    SPM  80    Minutes  17      Track   Laps  9    Minutes  17      Prescription Details   Frequency (times per week)  2    Duration  Progress to 45 minutes of aerobic exercise without signs/symptoms of physical distress      Intensity   THRR 40-80% of Max Heartrate  53-106    Ratings of Perceived Exertion  11-13    Perceived Dyspnea  0-4      Progression   Progression  Continue to progress workloads to maintain intensity without signs/symptoms of physical distress.      Resistance Training   Training Prescription  Yes    Weight  orange bands    Reps  10-15       Perform Capillary Blood Glucose checks as needed.  Exercise Prescription Changes: Exercise Prescription Changes    Row Name  07/25/18 1500 08/08/18 1600 08/22/18 1500 08/31/18 0735       Response to Exercise   Blood Pressure (Admit)  136/74  128/66  140/76  144/68    Blood Pressure (Exercise)  150/74  158/80  116/60  138/74    Blood Pressure (Exit)  106/70  104/70  132/70  100/70    Heart Rate (Admit)  87 bpm  88 bpm  77 bpm  87 bpm    Heart Rate (Exercise)  108 bpm  109 bpm  103 bpm  105 bpm    Heart Rate (Exit)  97 bpm  86 bpm  93 bpm  91 bpm    Oxygen Saturation (Admit)  99 %  98 %  98 %  98 %    Oxygen Saturation (Exercise)  95 %  95 %  96 %  95 %    Oxygen Saturation (Exit)  98 %  100 %  98 %  97 %    Rating of Perceived Exertion (Exercise)  '14  13  13  13    ' Perceived Dyspnea (Exercise)  '4  3  2  3    ' Duration  Progress to 45 minutes of aerobic exercise without signs/symptoms of physical distress  Progress to 45 minutes of aerobic exercise without signs/symptoms of physical distress  Progress to 45 minutes of aerobic exercise without signs/symptoms of physical distress  Progress to 45 minutes of aerobic exercise without signs/symptoms of physical distress    Intensity  - 40-80% HRR  THRR unchanged  THRR unchanged  THRR unchanged      Progression   Progression  Continue to progress workloads to maintain intensity without signs/symptoms of physical distress.  Continue to progress workloads to maintain intensity without signs/symptoms of physical distress.  Continue to progress workloads to maintain intensity without signs/symptoms of physical distress.  Continue to progress workloads to maintain intensity without signs/symptoms of physical distress.      Resistance Training   Training Prescription  Yes  Yes  Yes  Yes    Weight  orange bands  orange bands  orange bands  orange bands    Reps  10-15  10-15  10-15  10-15    Time  10 Minutes  10 Minutes  10 Minutes  10 Minutes      Interval Training   Interval Training  No  No  -  -      Recumbant Bike   Level  '1  2  2  2    ' Minutes  '17  17  17  17       ' NuStep   Level  '2  3  3  3    ' SPM  80  80  80  80    Minutes  '17  17  17  17    ' METs  2.4  2.2  2.4  2.2      Track   Laps  '9  10  9  10    ' Minutes  '17  17  17  17       ' Exercise Comments:   Exercise Goals and Review: Exercise Goals    Row Name 07/07/18 1408             Exercise Goals   Increase Physical Activity  Yes       Intervention  Provide advice, education, support and counseling about physical activity/exercise needs.;Develop an individualized exercise prescription for aerobic and resistive training based on initial evaluation findings, risk stratification, comorbidities and participant's personal goals.       Expected Outcomes  Short Term: Attend rehab on a regular basis to increase amount of physical activity.       Increase Strength and Stamina  Yes       Intervention  Provide advice,  education, support and counseling about physical activity/exercise needs.;Develop an individualized exercise prescription for aerobic and resistive training based on initial evaluation findings, risk stratification, comorbidities and participant's personal goals.       Expected Outcomes  Short Term: Increase workloads from initial exercise prescription for resistance, speed, and METs.       Able to understand and use rate of perceived exertion (RPE) scale  Yes       Intervention  Provide education and explanation on how to use RPE scale       Expected Outcomes  Short Term: Able to use RPE daily in rehab to express subjective intensity level;Long Term:  Able to use RPE to guide intensity level when exercising independently       Able to understand and use Dyspnea scale  Yes       Intervention  Provide education and explanation on how to use Dyspnea scale       Expected Outcomes  Short Term: Able to use Dyspnea scale daily in rehab to express subjective sense of shortness of breath during exertion;Long Term: Able to use Dyspnea scale to guide intensity level when exercising independently        Knowledge and understanding of Target Heart Rate Range (THRR)  Yes       Intervention  Provide education and explanation of THRR including how the numbers were predicted and where they are located for reference       Expected Outcomes  Short Term: Able to state/look up THRR;Long Term: Able to use THRR to govern intensity when exercising independently;Short Term: Able to use daily as guideline for intensity in rehab       Understanding of Exercise Prescription  Yes       Intervention  Provide education, explanation, and written materials on patient's individual exercise prescription       Expected Outcomes  Short Term: Able to explain program exercise prescription;Long Term: Able to explain home exercise prescription to exercise independently          Exercise Goals Re-Evaluation : Exercise Goals Re-Evaluation    Row Name 07/24/18 1106 08/22/18 0718 09/11/18 0734         Exercise Goal Re-Evaluation   Exercise Goals Review  Increase Physical Activity;Increase Strength and Stamina;Able to understand and use rate of perceived exertion (RPE) scale;Able to understand and use Dyspnea scale;Knowledge and understanding of Target Heart Rate Range (THRR);Understanding of Exercise Prescription  Increase Physical Activity;Increase Strength and Stamina;Able to understand and use rate of perceived exertion (RPE) scale;Able to understand and use Dyspnea scale;Knowledge and understanding of Target Heart Rate Range (THRR);Understanding of Exercise Prescription  -     Comments  Patient has only completed 2 rehab sessions. His barriers are extreme shortness of breath and deconditioning. I will discuss with him the importance of home exercise. Will cont to monitor and progress as able.  Patient's barriers are extreme shortness of breath and deconditioning. He had increased his walking distance since his first session. MET average places him in the low catergory. I will discuss with him the importance of home  exercise. Will cont to monitor and progress as able.  -     Expected Outcomes  Through exercise at rehab and at home, the patient will decrease shortness of breath with daily activities and feel confident in carrying out an exercise regime at home.   Through exercise at rehab and at home, the patient will decrease shortness of breath with daily activities and feel confident in  carrying out an exercise regime at home.   Pulmonary Rehab is closed (starting 09/04/18) until further notice due to the [facility] Virus.         Discharge Exercise Prescription (Final Exercise Prescription Changes): Exercise Prescription Changes - 08/31/18 0735      Response to Exercise   Blood Pressure (Admit)  144/68    Blood Pressure (Exercise)  138/74    Blood Pressure (Exit)  100/70    Heart Rate (Admit)  87 bpm    Heart Rate (Exercise)  105 bpm    Heart Rate (Exit)  91 bpm    Oxygen Saturation (Admit)  98 %    Oxygen Saturation (Exercise)  95 %    Oxygen Saturation (Exit)  97 %    Rating of Perceived Exertion (Exercise)  13    Perceived Dyspnea (Exercise)  3    Duration  Progress to 45 minutes of aerobic exercise without signs/symptoms of physical distress    Intensity  THRR unchanged      Progression   Progression  Continue to progress workloads to maintain intensity without signs/symptoms of physical distress.      Resistance Training   Training Prescription  Yes    Weight  orange bands    Reps  10-15    Time  10 Minutes      Recumbant Bike   Level  2    Minutes  17      NuStep   Level  3    SPM  80    Minutes  17    METs  2.2      Track   Laps  10    Minutes  17       Nutrition:  Target Goals: Understanding of nutrition guidelines, daily intake of sodium <1557m, cholesterol <207m calories 30% from fat and 7% or less from saturated fats, daily to have 5 or more servings of fruits and vegetables.  Biometrics: Pre Biometrics - 07/07/18 1410      Pre Biometrics   Grip Strength  35 kg         Nutrition Therapy Plan and Nutrition Goals: Nutrition Therapy & Goals - 07/25/18 1527      Nutrition Therapy   Diet  general healthful      Personal Nutrition Goals   Nutrition Goal  Pt to identify and consume food sources higher in protein and higher in calories    Personal Goal #2  The pt will recognize symptoms that can interfere with adequate oral intake    Personal Goal #3  The pt will consume high-energy, high-nutrient dense beverages when necessary to compensate for decreased oral intake of solid foods      Intervention Plan   Intervention  Prescribe, educate and counsel regarding individualized specific dietary modifications aiming towards targeted core components such as weight, hypertension, lipid management, diabetes, heart failure and other comorbidities.    Expected Outcomes  Short Term Goal: Understand basic principles of dietary content, such as calories, fat, sodium, cholesterol and nutrients.;Long Term Goal: Adherence to prescribed nutrition plan.       Nutrition Assessments: Nutrition Assessments - 07/13/18 1526      Rate Your Plate Scores   Pre Score  50       Nutrition Goals Re-Evaluation: Nutrition Goals Re-Evaluation    [place] 07/13/18 1526             Goals   Current Weight  173 lb 4.5 oz (78.6 kg)  Nutrition Goal  to be determined          Nutrition Goals Discharge (Final Nutrition Goals Re-Evaluation): Nutrition Goals Re-Evaluation - 07/13/18 1526      Goals   Current Weight  173 lb 4.5 oz (78.6 kg)    Nutrition Goal  to be determined       Psychosocial: Target Goals: Acknowledge presence or absence of significant depression and/or stress, maximize coping skills, provide positive support system. Participant is able to verbalize types and ability to use techniques and skills needed for reducing stress and depression.  Initial Review & Psychosocial Screening: Initial Psych Review & Screening - 07/07/18 1416      Initial  Review   Current issues with  None Identified      Family Dynamics   Good Support System?  Yes      Barriers   Psychosocial barriers to participate in program  There are no identifiable barriers or psychosocial needs.      Screening Interventions   Interventions  Encouraged to exercise    Expected Outcomes  Short Term goal: Identification and review with participant of any Quality of Life or Depression concerns found by scoring the questionnaire.;Long Term goal: The participant improves quality of Life and PHQ9 Scores as seen by post scores and/or verbalization of changes       Quality of Life Scores:  Scores of 19 and below usually indicate a poorer quality of life in these areas.  A difference of  2-3 points is a clinically meaningful difference.  A difference of 2-3 points in the total score of the Quality of Life Index has been associated with significant improvement in overall quality of life, self-image, physical symptoms, and general health in studies assessing change in quality of life.  PHQ-9: Recent Review Flowsheet Data    Depression screen [facility] 2/9 07/07/2018 10/03/2017 01/05/2017   Decreased Interest 0 0 0   Down, Depressed, Hopeless 0 0 0   PHQ - 2 Score 0 0 0   Altered sleeping 0 - -   Tired, decreased energy 0 - -   Change in appetite 0 - -   Feeling bad or failure about yourself  0 - -   Trouble concentrating 0 - -   Moving slowly or fidgety/restless 0 - -   Suicidal thoughts 0 - -   PHQ-9 Score 0 - -   Difficult doing work/chores Not difficult at all - -     Interpretation of Total Score  Total Score Depression Severity:  1-4 = Minimal depression, 5-9 = Mild depression, 10-14 = Moderate depression, 15-19 = Moderately severe depression, 20-27 = Severe depression   Psychosocial Evaluation and Intervention: Psychosocial Evaluation - 07/07/18 1521      Psychosocial Evaluation & Interventions   Interventions  Stress management education;Relaxation  education;Encouraged to exercise with the program and follow exercise prescription    Comments  Pt states he has no stressors in his life. He has a wife, who he states will be starting cardiac rehab later this month, who is a good support system for him a well as 4 children. Children do not live in the area, but he maintains contact.    Expected Outcomes  Pt will continue to have a positive and healthy outlook    Continue Psychosocial Services   Follow up required by staff       Psychosocial Re-Evaluation: Psychosocial Re-Evaluation    [place] Name 07/25/18 1641 08/22/18 1018 09/12/18 1156  Psychosocial Re-Evaluation   Current issues with  None Identified  None Identified  None Identified     Comments  No psychosocial barriers  No psychosocial barriers  No psychosocial barriers     Expected Outcomes  patient remained free from psychosocial barriers to participation in pulmonary rehab  patient remained free from psychosocial barriers to participation in pulmonary rehab  patient remained free from psychosocial barriers to participation in pulmonary rehab     Interventions  Encouraged to attend Pulmonary Rehabilitation for the exercise  Encouraged to attend Pulmonary Rehabilitation for the exercise  Encouraged to attend Pulmonary Rehabilitation for the exercise     Continue Psychosocial Services   No Follow up required  No Follow up required  No Follow up required        Psychosocial Discharge (Final Psychosocial Re-Evaluation): Psychosocial Re-Evaluation - 09/12/18 1156      Psychosocial Re-Evaluation   Current issues with  None Identified    Comments  No psychosocial barriers    Expected Outcomes  patient remained free from psychosocial barriers to participation in pulmonary rehab    Interventions  Encouraged to attend Pulmonary Rehabilitation for the exercise    Continue Psychosocial Services   No Follow up required       Education: Education Goals: Education classes will be  provided on a weekly basis, covering required topics. Participant will state understanding/return demonstration of topics presented.  Learning Barriers/Preferences: Learning Barriers/Preferences - 07/07/18 1420      Learning Barriers/Preferences   Learning Barriers  Sight    Learning Preferences  Verbal Instruction;Skilled Demonstration;Individual Instruction;Group Instruction;Computer/Internet       Education Topics: Risk Factor Reduction:  -Group instruction that is supported by a PowerPoint presentation. Instructor discusses the definition of a risk factor, different risk factors for pulmonary disease, and how the heart and lungs work together.     Nutrition for Pulmonary Patient:  -Group instruction provided by PowerPoint slides, verbal discussion, and written materials to support subject matter. The instructor gives an explanation and review of healthy diet recommendations, which includes a discussion on weight management, recommendations for fruit and vegetable consumption, as well as protein, fluid, caffeine, fiber, sodium, sugar, and alcohol. Tips for eating when patients are short of breath are discussed.   Pursed Lip Breathing:  -Group instruction that is supported by demonstration and informational handouts. Instructor discusses the benefits of pursed lip and diaphragmatic breathing and detailed demonstration on how to preform both.     Oxygen Safety:  -Group instruction provided by PowerPoint, verbal discussion, and written material to support subject matter. There is an overview of "What is Oxygen" and "Why do we need it".  Instructor also reviews how to create a safe environment for oxygen use, the importance of using oxygen as prescribed, and the risks of noncompliance. There is a brief discussion on traveling with oxygen and resources the patient may utilize.   PULMONARY REHAB OTHER RESPIRATORY from 08/31/2018 in [place]  Date  07/27/18   Educator  Cloyde Reams  Instruction Review Code  1- Verbalizes Understanding      Oxygen Equipment:  -Group instruction provided by [employer] utilizing handouts, written materials, and Insurance underwriter.   PULMONARY REHAB OTHER RESPIRATORY from 08/31/2018 in [place]  Date  08/03/18  Educator  Ace Gins  Instruction Review Code  1- Verbalizes Understanding      Signs and Symptoms:  -Group instruction provided by written material and verbal discussion to  support subject matter. Warning signs and symptoms of infection, stroke, and heart attack are reviewed and when to call the physician/911 reinforced. Tips for preventing the spread of infection discussed.   PULMONARY REHAB OTHER RESPIRATORY from 08/31/2018 in [place]  Date  08/17/18  Educator  Remo Lipps  Instruction Review Code  2- Demonstrated Understanding      Advanced Directives:  -Group instruction provided by verbal instruction and written material to support subject matter. Instructor reviews Advanced Directive laws and proper instruction for filling out document.   Pulmonary Video:  -Group video education that reviews the importance of medication and oxygen compliance, exercise, good nutrition, pulmonary hygiene, and pursed lip and diaphragmatic breathing for the pulmonary patient.   Exercise for the Pulmonary Patient:  -Group instruction that is supported by a PowerPoint presentation. Instructor discusses benefits of exercise, core components of exercise, frequency, duration, and intensity of an exercise routine, importance of utilizing pulse oximetry during exercise, safety while exercising, and options of places to exercise outside of rehab.     PULMONARY REHAB OTHER RESPIRATORY from 08/31/2018 in [place]  Date  08/10/18  Educator  Cloyde Reams  Instruction Review Code  1- Verbalizes Understanding      Pulmonary  Medications:  -Verbally interactive group education provided by instructor with focus on inhaled medications and proper administration.   PULMONARY REHAB OTHER RESPIRATORY from 12/15/2017 in [place]  Date  12/08/17  Educator  pharmacy  Instruction Review Code  1- Verbalizes Understanding      Anatomy and Physiology of the Respiratory System and Intimacy:  -Group instruction provided by PowerPoint, verbal discussion, and written material to support subject matter. Instructor reviews respiratory cycle and anatomical components of the respiratory system and their functions. Instructor also reviews differences in obstructive and restrictive respiratory diseases with examples of each. Intimacy, Sex, and Sexuality differences are reviewed with a discussion on how relationships can change when diagnosed with pulmonary disease. Common sexual concerns are reviewed.   PULMONARY REHAB OTHER RESPIRATORY from 08/31/2018 in [place]  Date  08/31/18  Educator  RN  Instruction Review Code  1- Verbalizes Understanding      MD DAY -A group question and answer session with a medical doctor that allows participants to ask questions that relate to their pulmonary disease state.   PULMONARY REHAB OTHER RESPIRATORY from 12/15/2017 in [place]  Date  11/24/17  Educator  Dr.Yacoub  Instruction Review Code  1- Verbalizes Understanding      OTHER EDUCATION -Group or individual verbal, written, or video instructions that support the educational goals of the pulmonary rehab program.   PULMONARY REHAB OTHER RESPIRATORY from 12/15/2017 in [place]  Date  12/15/17  Educator  [place]  Instruction Review Code  1- Verbalizes Understanding      Holiday Eating Survival Tips:  -Group instruction provided by PowerPoint slides, verbal discussion,  and written materials to support subject matter. The instructor gives patients tips, tricks, and techniques to help them not only survive but enjoy the holidays despite the onslaught of food that accompanies the holidays.   Knowledge Questionnaire Score: Knowledge Questionnaire Score - 07/07/18 1421      Knowledge Questionnaire Score   Pre Score  18/18       Core Components/Risk Factors/Patient Goals at Admission: Personal Goals and Risk Factors at Admission -  07/07/18 1421      Core Components/Risk Factors/Patient Goals on Admission    Weight Management  Weight Maintenance    Improve shortness of breath with ADL's  Yes    Intervention  Provide education, individualized exercise plan and daily activity instruction to help decrease symptoms of SOB with activities of daily living.    Expected Outcomes  Short Term: Improve cardiorespiratory fitness to achieve a reduction of symptoms when performing ADLs;Long Term: Be able to perform more ADLs without symptoms or delay the onset of symptoms    Hypertension  Yes    Intervention  Provide education on lifestyle modifcations including regular physical activity/exercise, weight management, moderate sodium restriction and increased consumption of fresh fruit, vegetables, and low fat dairy, alcohol moderation, and smoking cessation.;Monitor prescription use compliance.    Expected Outcomes  Short Term: Continued assessment and intervention until BP is < 140/73m HG in hypertensive participants. < 130/834mHG in hypertensive participants with diabetes, heart failure or chronic kidney disease.;Long Term: Maintenance of blood pressure at goal levels.    Lipids  Yes    Intervention  Provide education and support for participant on nutrition & aerobic/resistive exercise along with prescribed medications to achieve LDL <708mHDL >8m61m  Expected Outcomes  Short Term: Participant states understanding of desired cholesterol values and is compliant with  medications prescribed. Participant is following exercise prescription and nutrition guidelines.;Long Term: Cholesterol controlled with medications as prescribed, with individualized exercise RX and with personalized nutrition plan. Value goals: LDL < 70mg86mL > 40 mg.       Core Components/Risk Factors/Patient Goals Review:  Goals and Risk Factor Review    Row Name 07/07/18 1422 07/25/18 1641 08/22/18 1019 09/12/18 1156       Core Components/Risk Factors/Patient Goals Review   Personal Goals Review  Improve shortness of breath with ADL's;Increase knowledge of respiratory medications and ability to use respiratory devices properly.;Develop more efficient breathing techniques such as purse lipped breathing and diaphragmatic breathing and practicing self-pacing with activity.;Hypertension  Improve shortness of breath with ADL's;Increase knowledge of respiratory medications and ability to use respiratory devices properly.;Develop more efficient breathing techniques such as purse lipped breathing and diaphragmatic breathing and practicing self-pacing with activity.;Hypertension  Improve shortness of breath with ADL's;Increase knowledge of respiratory medications and ability to use respiratory devices properly.;Develop more efficient breathing techniques such as purse lipped breathing and diaphragmatic breathing and practicing self-pacing with activity.;Hypertension  Improve shortness of breath with ADL's;Increase knowledge of respiratory medications and ability to use respiratory devices properly.;Develop more efficient breathing techniques such as purse lipped breathing and diaphragmatic breathing and practicing self-pacing with activity.;Hypertension    Review  -  Just started the program, has only attended 3 exercise sessions, too early to have met any goals. Hypertension is controlled.  Progressing well in the program, workloads are increasing  Program is closed d/t COVID-19 precautions until further  notice, hopefully will reopen October 10, 2018, unable to meet goals d/t program closure    Expected Outcomes  -  see admission expected outcomes.  see admission expected outcomes.  see admission expected outcomes.       Core Components/Risk Factors/Patient Goals at Discharge (Final Review):  Goals and Risk Factor Review - 09/12/18 1156      Core Components/Risk Factors/Patient Goals Review   Personal Goals Review  Improve shortness of breath with ADL's;Increase knowledge of respiratory medications and ability to use respiratory devices properly.;Develop more efficient breathing techniques such as purse lipped breathing and  diaphragmatic breathing and practicing self-pacing with activity.;Hypertension    Review  Program is closed d/t COVID-19 precautions until further notice, hopefully will reopen October 10, 2018, unable to meet goals d/t program closure    Expected Outcomes  see admission expected outcomes.       ITP Comments: ITP Comments    Row Name 07/07/18 1350           ITP Comments  Dr. Jennet Maduro, Medical Director, Pulmonary Rehab          Comments: ITP REVIEW Pt is making expected progress toward pulmonary rehab goals after completing 14 sessions. Recommend continued exercise, life style modification, education, and utilization of breathing techniques to increase stamina and strength and decrease shortness of breath with exertion.

## 2018-09-14 ENCOUNTER — Encounter (HOSPITAL_COMMUNITY): Payer: Medicare Other

## 2018-09-19 ENCOUNTER — Encounter (HOSPITAL_COMMUNITY): Payer: Medicare Other

## 2018-09-21 ENCOUNTER — Encounter (HOSPITAL_COMMUNITY): Payer: Medicare Other

## 2018-09-26 ENCOUNTER — Encounter (HOSPITAL_COMMUNITY): Payer: Medicare Other

## 2018-09-28 ENCOUNTER — Encounter (HOSPITAL_COMMUNITY): Payer: Medicare Other

## 2018-10-03 ENCOUNTER — Encounter (HOSPITAL_COMMUNITY): Payer: Medicare Other

## 2018-10-03 ENCOUNTER — Telehealth (HOSPITAL_COMMUNITY): Payer: Self-pay

## 2018-10-05 ENCOUNTER — Encounter (HOSPITAL_COMMUNITY): Payer: Medicare Other

## 2018-10-10 ENCOUNTER — Encounter (HOSPITAL_COMMUNITY): Payer: Medicare Other

## 2018-10-12 ENCOUNTER — Encounter (HOSPITAL_COMMUNITY): Payer: Medicare Other

## 2018-10-17 ENCOUNTER — Encounter (HOSPITAL_COMMUNITY): Payer: Medicare Other

## 2018-10-19 ENCOUNTER — Encounter (HOSPITAL_COMMUNITY): Payer: Medicare Other

## 2018-10-24 ENCOUNTER — Encounter (HOSPITAL_COMMUNITY): Payer: Medicare Other

## 2018-10-26 ENCOUNTER — Encounter (HOSPITAL_COMMUNITY): Payer: Medicare Other

## 2018-10-31 ENCOUNTER — Telehealth (HOSPITAL_COMMUNITY): Payer: Self-pay | Admitting: *Deleted

## 2018-11-15 ENCOUNTER — Telehealth (HOSPITAL_COMMUNITY): Payer: Self-pay | Admitting: *Deleted

## 2018-11-17 ENCOUNTER — Telehealth (HOSPITAL_COMMUNITY): Payer: Self-pay | Admitting: *Deleted

## 2018-11-17 NOTE — Telephone Encounter (Signed)
Please contact letter sent to address on file in epic in regards to virtual pulmonary rehab. Await response, if no response by June 12th will discharge pt from the program.  Maurice Small RN, BSN Cardiac and Pulmonary Rehab Nurse Navigator

## 2019-01-05 ENCOUNTER — Telehealth (HOSPITAL_COMMUNITY): Payer: Self-pay

## 2019-02-09 NOTE — Telephone Encounter (Signed)
Received email from patient asking when his next appointment is with Dr. Halford Chessman.  Nothing is showing as scheduled.  Send an email response asking for patient to call the office to get an appointment scheduled. Nothing further needed at this time.

## 2019-02-28 ENCOUNTER — Encounter: Payer: Self-pay | Admitting: Acute Care

## 2019-02-28 ENCOUNTER — Other Ambulatory Visit: Payer: Self-pay

## 2019-02-28 ENCOUNTER — Ambulatory Visit (INDEPENDENT_AMBULATORY_CARE_PROVIDER_SITE_OTHER): Payer: Medicare Other | Admitting: Acute Care

## 2019-02-28 VITALS — BP 130/56 | HR 69 | Temp 97.7°F | Ht 70.0 in | Wt 172.2 lb

## 2019-02-28 DIAGNOSIS — Z0000 Encounter for general adult medical examination without abnormal findings: Secondary | ICD-10-CM | POA: Insufficient documentation

## 2019-02-28 DIAGNOSIS — J449 Chronic obstructive pulmonary disease, unspecified: Secondary | ICD-10-CM

## 2019-02-28 DIAGNOSIS — R000 Tachycardia, unspecified: Secondary | ICD-10-CM | POA: Diagnosis not present

## 2019-02-28 DIAGNOSIS — J441 Chronic obstructive pulmonary disease with (acute) exacerbation: Secondary | ICD-10-CM

## 2019-02-28 DIAGNOSIS — J9611 Chronic respiratory failure with hypoxia: Secondary | ICD-10-CM

## 2019-02-28 DIAGNOSIS — Z23 Encounter for immunization: Secondary | ICD-10-CM

## 2019-02-28 DIAGNOSIS — C44329 Squamous cell carcinoma of skin of other parts of face: Secondary | ICD-10-CM

## 2019-02-28 MED ORDER — PREDNISONE 5 MG PO TABS: 2.5000 mg | ORAL_TABLET | Freq: Every day | ORAL | 1 refills | Status: DC

## 2019-02-28 NOTE — Progress Notes (Signed)
History of Present Illness [name] is a 85 y.o. 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 Halford [name]. Uses Oxygen at 2 L [place] at night Prednisone dependent since 10/2013 [facility]>> Needs to transition to Trelegy once it is on the formulary. Currently he is unable to get this through the [place].   02/28/2019 6 month follow up Pt. Presents for follow up. He has been compliant with his prednisone 2.5 mg every other day. This was a decrease from 5 mg daily since March of 2020.He states he has noticed he gets out of breath more quickly when he walks a short distance over the last 4-5 months, which correlates to when he decreased his prednisone . He states he does check his oxygen saturations and he states the lowest he has seen them is 91%.He was going to Pulmonary rehab at [facility] when the pandemic hit, and he was unable to complete it. He may be interested in repeating this when they re-open. He feels deconditioning may have something to do with his increased shortness of breath. Secretions are at his baseline,  clear with occasional greenish to yellow.He denies fever. He denies any wheezing.He is compliant with his maintenance inhalers.   He does not need his rescue inhaler every day. He is compliant with his nocturnal oxygen. He needs his handicap plaquard renewed. He denies fever, chest pain, orthopnea or hemoptysis. He denies any swelling in his lower extremities. He wants to get his flu vaccine today.   Test Results: PFT 07/14/06 >>FEV1 1.41(51%), FEV1% 60, TLC 7.3(123%), DLCO 75%, no BD  PFT 07/29/10 >> FEV1 1.30(50%), FEV1% 50, TLC 6.39(100%), DLCO 68%, no BD Spirometry 11/17/10 >> FEV1 1.15(36%), FEV1% 56 Spirometry 10/26/13 >> FEV1 0.92 (30%), FEV1% 39 Spirometry 04/07/15 >> FEV1 0.91, FEV1% 42 Sleep ONO with RA 11/30/12 >>Test time 9 hrs 30 min. Mean SpO2 92.8%, low SpO2 86%.  Spent 3 min with SpO2 <88% ONO with 2 liters 01/16/14 >>test time 8 hrs 36 min. Basal SpO2 93%, low SpO2 85%. Spent 4 min with SpO2 <88%. CT angio chest 07/14/15 >> atherosclerosis, several nodules  CBC Latest Ref Rng & Units 05/22/2018 05/21/2018 05/20/2018  WBC 4.0 - 10.5 K/uL 14.3(H) 11.8(H) 8.2  Hemoglobin 13.0 - 17.0 g/dL 11.7(L) 12.0(L) 14.2  Hematocrit 39.0 - 52.0 % 36.6(L) 36.7(L) 44.8  Platelets 150 - 400 K/uL 216 207 206    BMP Latest Ref Rng & Units 05/22/2018 05/21/2018 05/20/2018  Glucose 70 - 99 mg/dL 132(H) 156(H) 118(H)  BUN 8 - 23 mg/dL 25(H) 24(H) 20  Creatinine 0.61 - 1.24 mg/dL 1.19 1.27(H) 1.46(H)  Sodium 135 - 145 mmol/L 136 136 138  Potassium 3.5 - 5.1 mmol/L 4.4 4.4 4.4  Chloride 98 - 111 mmol/L 103 103 101  CO2 22 - 32 mmol/L 28 22 26   Calcium 8.9 - 10.3 mg/dL 8.1(L) 8.1(L) 8.7(L)    BNP    Component Value Date/Time   BNP 27.0 05/20/2018 1109    ProBNP    Component Value Date/Time   PROBNP <30.0 12/08/2006 0115    PFT No results found for: FEV1PRE, FEV1POST, FVCPRE, FVCPOST, TLC, DLCOUNC, PREFEV1FVCRT, PSTFEV1FVCRT  No results found.   Past medical hx Past Medical History:  Diagnosis Date   Arthritis    osteoarthritis   Cancer ([place])    skin cancer only.-no melanoma.   COPD (chronic obstructive pulmonary disease) ([place])    History  of kidney stones    lithotripsy x1    History of oxygen administration    Oxygen @ 2 l/m nasally bedtime   Hyperlipidemia    Hypothyroidism    Pulmonary nodule, right    Skin cancer 09/06/2016   left ear squamous cell   Tobacco abuse    Quit 09/17/10     Social History   Tobacco Use   Smoking status: Former Smoker    Packs/day: 1.00    Years: 55.00    Pack years: 55.00    Types: Cigarettes    Quit date: 04/22/2011    Years since quitting: 7.8   Smokeless tobacco: Never Used   Tobacco comment: occasionally smoked 2-3 times a week--1 cig each time  Substance Use Topics   Alcohol use:  Yes    Comment: rare  social   Drug use: No    [name] reports that he quit smoking about 7 years ago. His smoking use included cigarettes. He has a 55.00 pack-year smoking history. He has never used smokeless tobacco. He reports current alcohol use. He reports that he does not use drugs.  Tobacco Cessation: Former smoker , quit 2012 with a 55 pack year smoking history  Past surgical hx, Family hx, Social hx all reviewed.  Current Outpatient Medications on File Prior to Visit  Medication Sig   albuterol (PROVENTIL HFA;VENTOLIN HFA) 108 (90 BASE) MCG/ACT inhaler Inhale 2 puffs into the lungs every 4 (four) hours as needed for wheezing or shortness of breath.   albuterol (PROVENTIL) (2.5 MG/3ML) 0.083% nebulizer solution USE 1 VIAL IN NEBULIZER EVERY 6 HOURS. Generic: VENTOLIN (Patient taking differently: Take 2.5 mg by nebulization 3 (three) times daily. )   guaiFENesin (MUCINEX) 600 MG 12 hr tablet Take 2 tablets (1,200 mg total) by mouth 2 (two) times daily as needed.   levothyroxine (SYNTHROID, LEVOTHROID) 50 MCG tablet Take 50 mcg by mouth daily before breakfast.    mometasone (ASMANEX) 220 MCG/INH inhaler Inhale 2 puffs into the lungs daily.   predniSONE (DELTASONE) 5 MG tablet TAKE 1 TABLET (5 MG TOTAL) BY MOUTH DAILY WITH BREAKFAST.   simvastatin (ZOCOR) 5 MG tablet Take 5 mg by mouth at bedtime.     Tiotropium Bromide-Olodaterol (STIOLTO RESPIMAT) 2.5-2.5 MCG/ACT AERS Inhale 2 puffs into the lungs daily.   No current facility-administered medications on file prior to visit.      Allergies  Allergen Reactions   Sulfa Antibiotics    Sulfonamide Derivatives     Unknown - as a child    Review Of Systems:  Constitutional:   No  weight loss, night sweats,  Fevers, chills, fatigue, or  lassitude.  HEENT:   No headaches,  Difficulty swallowing,  Tooth/dental problems, or  Sore throat,                No sneezing, itching, ear ache, nasal congestion, post nasal drip,    CV:  No chest pain,  Orthopnea, PND, swelling in lower extremities, anasarca, dizziness, palpitations, syncope.   GI  No heartburn, indigestion, abdominal pain, nausea, vomiting, diarrhea, change in bowel habits, loss of appetite, bloody stools.   Resp: + shortness of breath with exertion less at rest.  No excess mucus, no productive cough,  No non-productive cough,  No coughing up of blood.  No change in color of mucus.  No wheezing.  No chest wall deformity  Skin: no rash , + skin cancer removal scar to left scalp covered with dressing.  GU: no  dysuria, change in color of urine, no urgency or frequency.  No flank pain, no hematuria   MS:  No joint pain or swelling.  No decreased range of motion.  No back pain.  Psych:  No change in mood or affect. No depression or anxiety.  No memory loss.   Vital Signs BP (!) 130/56 (BP Location: Left Arm, Cuff Size: Large)    Pulse 69    Temp 97.7 F (36.5 C) (Oral)    Ht 5\' 10"  (1.778 m)    Wt 172 lb 3.2 oz (78.1 kg)    SpO2 98%    BMI 24.71 kg/m    Physical Exam:  General- No distress,  A&Ox3, well kept and pleasant ENT: No sinus tenderness, TM clear, pale nasal mucosa, no oral exudate,no post nasal drip, no LAN Cardiac: S1, S2, regular rate and rhythm, no murmur Chest: No wheeze/ rales/ dullness; no accessory muscle use, no nasal flaring, no sternal retractions, diminished per bases, prolonged expiratory phase Abd.: Soft Non-tender, ND, BS + Ext: No clubbing cyanosis, edema, no obvious deformities Neuro:  Deconditioned at baseline, MAE x 4, A&O x 3 Skin: No rashes, warm and dry, dressing to left scalp >> skin cancer removal Psych: normal mood and behavior   Assessment/Plan  Tachycardia Tachycardia with exertion Using nocturnal oxygen , but does not drop sats to qualify for exertional oxygen No lower extremity edema No echo on file Plan Cardiology consult Wear oxygen with exertion to maintain sats > 90%  Healthcare  maintenance Flu vaccine today  COPD GOLD IV Compliant with Stiolto and asmanex daily Worsening shortness of breath with dyspnea since March when daily prednisone was dropped from 5 mg daily to 2.5 mg every other day Plan We will renew your handicap Plaquard  today Follow up with you Pulmonologist at the [facility] and see if Trelegy is on your Formulary .  If it is, let us know so we can transition you over.  We will walk you today to make sure your oxygen saturations are > 88% with exertion Continue using oxygen at bedtime as you have been doing Use oxygen during the day with exertion and increased heart rate to maintain sats > 90% Therapeutic trial of  prednisone 2.5 mg daily x 1 month  Follow up to see if breathing is better. Continue Asmanex 2 puffs once daily Rinse mouth after use Continue Stiolto 2 puffs daily   Rinse mouth after use Continue albuterol nebs/ inhaler as needed for breakthrough shortness of breath or wheezing Follow up Telephone or video appointment in 1 month with Judson Roch NP or Dr. Halford [name]  Flu shot today We will consider PFT's at follow up. 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 Skin grafting failed Plan Monitor for worsening with daily prednisone 2.5 mg x 1 month  This appointment was over 30  minutes long with over 50% of the time being direct face to face patient care, assessment , plan of care , and follow up,    Magdalen Spatz, NP 02/28/2019  12:38 PM

## 2019-02-28 NOTE — Assessment & Plan Note (Addendum)
Compliant with Stiolto and asmanex daily Worsening shortness of breath with dyspnea since March when daily prednisone was dropped from 5 mg daily to 2.5 mg every other day Plan We will renew your handicap Plaquard  today Follow up with you Pulmonologist at the [facility] and see if Trelegy is on your Formulary .  If it is, let us know so we can transition you over.  We will walk you today to make sure your oxygen saturations are > 88% with exertion Continue using oxygen at bedtime as you have been doing Use oxygen during the day with exertion and increased heart rate to maintain sats > 90% Therapeutic trial of  prednisone 2.5 mg daily x 1 month  Follow up to see if breathing is better. Continue Asmanex 2 puffs once daily Rinse mouth after use Continue Stiolto 2 puffs daily   Rinse mouth after use Continue albuterol nebs/ inhaler as needed for breakthrough shortness of breath or wheezing Follow up Telephone or video appointment in 1 month with Judson Roch NP or Dr. Halford Chessman  Flu shot today We will consider PFT's at follow up. Flu vaccine today Please contact office for sooner follow up if symptoms do not improve or worsen or seek emergency care

## 2019-02-28 NOTE — Assessment & Plan Note (Addendum)
Tachycardia with exertion Using nocturnal oxygen , but does not drop sats to qualify for exertional oxygen No lower extremity edema No echo on file Plan Cardiology consult Wear oxygen with exertion to maintain sats > 90%

## 2019-02-28 NOTE — Assessment & Plan Note (Signed)
Flu vaccine today 

## 2019-02-28 NOTE — Assessment & Plan Note (Addendum)
Small non-healing wound to left forehead. Skin grafting failed Plan Monitor for worsening with daily prednisone 2.5 mg x 1 month

## 2019-02-28 NOTE — Assessment & Plan Note (Deleted)
Compliant with Stiolto and asmanex daily Worsening shortness of breath with dyspnea since March when daily prednisone was dropped from 5 mg daily to 2.5 mg every other day Plan We will renew your handicap Plaquard  today Follow up with you Pulmonologist at the [facility] and see if Trelegy is on your Formulary .  If it is, let us know so we can transition you over.  We will walk you today to make sure your oxygen saturations are > 88% with exertion Continue using oxygen at bedtime as you have been doing Use oxygen during the day with exertion and increased heart rate to maintain sats > 90% Lets take prednisone 2.5 mg daily x 1 month  Follow up to see if breathing is better. Continue Asmanex 2 puffs once daily Rinse mouth after use Continue Stiolto 2 puffs daily   Rinse mouth after use Follow up Telephone or video appointment in 1 month with Sean Roch NP or [name]  Flu shot today We will consider PFT's at follow up. Flu vaccine today Please contact office for sooner follow up if symptoms do not improve or worsen or seek emergency care

## 2019-02-28 NOTE — Assessment & Plan Note (Addendum)
Continue wearing oxygen at 2 L at bedtime Wear oxygen with exertion for saturations of < 90% Saturation goals are > 90%

## 2019-02-28 NOTE — Progress Notes (Signed)
Reviewed and agree with assessment/plan.   Tupac Jeffus, MD [facility] 06/16/2016, 12:24 PM Pager:  [phone]  

## 2019-02-28 NOTE — Patient Instructions (Addendum)
It is good to see you today.  We will renew your handicap Plaquard  today Follow up with you Pulmonologist at the [facility] and see if Trelegy is on your Formulary .  If it is, let us know so we can transition you over.  We will walk you today to make sure your oxygen saturations are > 88% with exertion Lets take prednisone 2.5 mg daily x 1 month, and then follow up to see if breathing is better. Continue Asmanex 2 puffs once daily Rinse mouth after use Continue Stiolto 2 puffs daily   Rinse mouth after use Continue albuterol nebs/ inhaler as needed for breakthrough shortness of breath or wheezing Follow up Telephone or video appointment in 1 month with Judson Roch NP or Dr. Halford Chessman  Flu shot today Cardiology consult to Dr. Einar Gip for increased HR with activity( Does not qualify for oxygen with exertion) We will consider PFT's at follow up. Flu vaccine today Please contact office for sooner follow up if symptoms do not improve or worsen or seek emergency care

## 2019-03-22 ENCOUNTER — Encounter: Payer: Self-pay | Admitting: Cardiology

## 2019-03-22 ENCOUNTER — Ambulatory Visit (INDEPENDENT_AMBULATORY_CARE_PROVIDER_SITE_OTHER): Payer: Medicare Other | Admitting: Cardiology

## 2019-03-22 ENCOUNTER — Other Ambulatory Visit: Payer: Self-pay

## 2019-03-22 VITALS — BP 143/67 | HR 61 | Temp 97.7°F | Ht 68.0 in | Wt 172.0 lb

## 2019-03-22 DIAGNOSIS — R000 Tachycardia, unspecified: Secondary | ICD-10-CM | POA: Diagnosis not present

## 2019-03-22 DIAGNOSIS — R0609 Other forms of dyspnea: Secondary | ICD-10-CM

## 2019-03-22 DIAGNOSIS — R6889 Other general symptoms and signs: Secondary | ICD-10-CM | POA: Diagnosis not present

## 2019-03-22 DIAGNOSIS — R0600 Dyspnea, unspecified: Secondary | ICD-10-CM | POA: Diagnosis not present

## 2019-03-22 NOTE — Progress Notes (Signed)
Patient referred by Magdalen Spatz, NP for tachycardia, decreased exercise tolerance  Subjective:   [name], male    DOB: [birth year], 85 y.o.   MRN: [id number]   Chief Complaint  Patient presents with  . Tachycardia  . 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  Patient has been seeing pulmonologist at [facility] pulmonology for COPD management.  He has been on 2 L oxygen at home.  Over the last 1 year, he has noticed decreased exercise tolerance, exertional dyspnea and increasing heart rate with minimal activity.  He states that his COPD has overall been stable.  He denies any chest pain.  Also denies orthopnea, PND, leg edema.  He has history of multinodular goiter, but has reportedly had stable thyroid function over the years.  Past Medical History:  Diagnosis Date  . Arthritis    osteoarthritis  . Cancer ([place])    skin cancer only.-no melanoma.  Marland Kitchen COPD (chronic obstructive pulmonary disease) ([place])   . History of kidney stones    lithotripsy x1   . History of oxygen administration    Oxygen @ 2 l/m nasally bedtime  . Hyperlipidemia   . Hypothyroidism   . Pulmonary nodule, right   . 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 Tobacco abuse    Quit 09/17/10     Past Surgical History:  Procedure Laterality Date  . CATARACT EXTRACTION, BILATERAL    . CONVERSION TO TOTAL HIP Left 04/28/2015   Procedure: LEFT HIP CONVERSION PREVIOUS HIP SURGERY TO TOTAL HIP ARTHROPLASTY (ANTERIOR);  Surgeon: Paralee Cancel, MD;  Location: [facility];  Service: Orthopedics;  Laterality: Left;  . electronavigational bronchoscopy  07/2010  . HIP PINNING,CANNULATED Left 06/04/2014   Procedure: LEFT HIP REDUCTION WITH PERCUTANEOUS  SCREWS;  Surgeon: Mauri Pole, MD;  Location: [place];  Service: Orthopedics;  Laterality: Left;  . TRACHEOSTOMY     after aspiration event as a 85 y/o     Social History   Socioeconomic History  . Marital status: Married    Spouse name: Not on file  . Number of children: Not on file  . Years of education: Not on file  . Highest education level: Not on file  Occupational History  . Not on file  Social Needs  . Financial resource strain: Not on file  . Food insecurity    Worry: Not on file    Inability: Not on file  . Transportation needs    Medical: Not on file    Non-medical: Not on file  Tobacco Use  . Smoking status: Former Smoker    Packs/day: 1.00    Years: 55.00    Pack years: 55.00    Types: Cigarettes    Quit date: 04/22/2011    Years since quitting: 7.9  . Smokeless tobacco: Never Used  . Tobacco comment: occasionally smoked 2-3 times a week--1 cig each time  Substance and Sexual Activity  . Alcohol use: Yes    Comment: rare  social  . Drug use: No  . Sexual activity: Not on file  Lifestyle  . Physical activity    Days per week: Not on file    Minutes per session: Not on file  . Stress: Not on file  Relationships  . Social  connections    Talks on phone: Not on file    Gets together: Not on file    Attends religious service: Not on file    Active member of club or organization: Not on file    Attends meetings of clubs or organizations: Not on file    Relationship status: Not on file  . Intimate partner violence    Fear of current or ex partner: No    Emotionally abused: No    Physically abused: No    Forced sexual activity: No  Other Topics Concern  . Not on file  Social History Narrative  . Not on file     Family History  Problem Relation Age of Onset  . Cancer Neg Hx      Current Outpatient Medications on File Prior to Visit  Medication Sig Dispense Refill  . albuterol (PROVENTIL HFA;VENTOLIN HFA) 108 (90 BASE) MCG/ACT inhaler Inhale 2  puffs into the lungs every 4 (four) hours as needed for wheezing or shortness of breath.    Marland Kitchen albuterol (PROVENTIL) (2.5 MG/3ML) 0.083% nebulizer solution USE 1 VIAL IN NEBULIZER EVERY 6 HOURS. Generic: VENTOLIN (Patient taking differently: Take 2.5 mg by nebulization 3 (three) times daily. ) 120 vial 10  . guaiFENesin (MUCINEX) 600 MG 12 hr tablet Take 2 tablets (1,200 mg total) by mouth 2 (two) times daily as needed.    Marland Kitchen levothyroxine (SYNTHROID, LEVOTHROID) 50 MCG tablet Take 50 mcg by mouth daily before breakfast.     . mometasone (ASMANEX) 220 MCG/INH inhaler Inhale 2 puffs into the lungs daily.    . predniSONE (DELTASONE) 5 MG tablet TAKE 1 TABLET (5 MG TOTAL) BY MOUTH DAILY WITH BREAKFAST. 30 tablet 3  . predniSONE (DELTASONE) 5 MG tablet Take 0.5 tablets (2.5 mg total) by mouth daily with breakfast. Take one half tablet ( 2.5 mg daily) x 30 days at breakfast. 15 tablet 1  . simvastatin (ZOCOR) 5 MG tablet Take 5 mg by mouth at bedtime.      . Tiotropium Bromide-Olodaterol (STIOLTO RESPIMAT) 2.5-2.5 MCG/ACT AERS Inhale 2 puffs into the lungs daily. 1 Inhaler 6   No current facility-administered medications on file prior to visit.     Cardiovascular studies:  EKG 03/22/2019: Sinus rhythm 74 bpm. Poor R-wave progression.  Recent labs: 10/25/2018: Glucose 107. BUN/Cr 11/1.1 eGFR 56. Na/K 140/4.8. Rest of the CMP normal. Chol 139, TG 93, HDL 51, LDL 69.  05/10/2018: H/H 13/39, MCV 90. Platelets 298. TSH 2.4. Normal total T4.   Review of Systems  Constitution: Negative for decreased appetite, malaise/fatigue, weight gain and weight loss.  HENT: Negative for congestion.   Eyes: Negative for visual disturbance.  Cardiovascular: Positive for dyspnea on exertion. Negative for chest pain, leg swelling, palpitations and syncope.  Respiratory: Positive for shortness of breath. Negative for cough.   Endocrine: Negative for cold intolerance.  Hematologic/Lymphatic: Does not bruise/bleed  easily.  Skin: Positive for skin cancer. Negative for itching and rash.  Musculoskeletal: Negative for myalgias.  Gastrointestinal: Negative for abdominal pain, nausea and vomiting.  Genitourinary: Negative for dysuria.  Neurological: Negative for dizziness and weakness.  Psychiatric/Behavioral: The patient is not nervous/anxious.   All other systems reviewed and are negative.        Vitals:   03/22/19 1045  BP: (!) 143/67  Pulse: 61  Temp: 97.7 F (36.5 C)  SpO2: 97%     Body mass index is 26.15 kg/m. Filed Weights   03/22/19 1045  Weight: 78 kg  Objective:   Physical Exam  Constitutional: He is oriented to person, place, and time. He appears well-developed and well-nourished. No distress.  HENT:  Head: Normocephalic and atraumatic.  Eyes: Pupils are equal, round, and reactive to light. Conjunctivae are normal.  Neck: No JVD present.  Cardiovascular: Normal rate, regular rhythm and intact distal pulses.  No murmur heard. Pulmonary/Chest: Effort normal and breath sounds normal. He has no wheezes. He has no rales.  Abdominal: Soft. Bowel sounds are normal. There is no rebound.  Musculoskeletal:        General: No edema.  Lymphadenopathy:    He has no cervical adenopathy.  Neurological: He is alert and oriented to person, place, and time. No cranial nerve deficit.  Skin: Skin is warm and dry.  Nonhealing wound on forehead  Psychiatric: He has a normal mood and affect.  Nursing note and vitals reviewed.         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  Exertional dyspnea, decreased exercise tolerance, tachycardia: No clear anginal symptoms.  Normal resting EKG.  No heart failure on exam.  While COPD is most  likely etiology of his exertional dyspnea and decreased exercise tolerance, obstructive CAD needs to be excluded and 85 year old former smoker.  Will obtain echocardiogram and exercise/Lexiscan nuclear stress test.  Further recommendations after above testing.  Thank you for referring the patient to [place]. Please feel free to contact with any questions.  Nigel Mormon, MD [facility] Pager: [phone] Office: [phone] If no answer Cell [phone]

## 2019-03-26 ENCOUNTER — Other Ambulatory Visit: Payer: Self-pay

## 2019-03-26 ENCOUNTER — Ambulatory Visit (INDEPENDENT_AMBULATORY_CARE_PROVIDER_SITE_OTHER): Payer: Medicare Other

## 2019-03-26 DIAGNOSIS — R0609 Other forms of dyspnea: Secondary | ICD-10-CM

## 2019-03-26 DIAGNOSIS — R0602 Shortness of breath: Secondary | ICD-10-CM | POA: Diagnosis not present

## 2019-03-26 DIAGNOSIS — R0600 Dyspnea, unspecified: Secondary | ICD-10-CM | POA: Diagnosis not present

## 2019-03-26 DIAGNOSIS — R6889 Other general symptoms and signs: Secondary | ICD-10-CM | POA: Diagnosis not present

## 2019-04-02 ENCOUNTER — Other Ambulatory Visit: Payer: Self-pay

## 2019-04-02 ENCOUNTER — Ambulatory Visit (INDEPENDENT_AMBULATORY_CARE_PROVIDER_SITE_OTHER): Payer: Medicare Other

## 2019-04-02 DIAGNOSIS — R6889 Other general symptoms and signs: Secondary | ICD-10-CM

## 2019-04-02 DIAGNOSIS — R0600 Dyspnea, unspecified: Secondary | ICD-10-CM | POA: Diagnosis not present

## 2019-04-02 DIAGNOSIS — R0609 Other forms of dyspnea: Secondary | ICD-10-CM

## 2019-04-03 NOTE — Addendum Note (Signed)
Encounter addended by: Lance Morin, RN on: 04/03/2019 4:20 PM  Actions taken: Clinical Note Signed, Episode resolved

## 2019-04-03 NOTE — Progress Notes (Signed)
Discharge Progress Report  Patient Details  Name: [name] MRN: [id number] Date of Birth: 12/02/1927 Referring Provider:     Pulmonary Rehab Walk Test from 07/11/2018 in [place]  Referring Provider  Dr. Halford Chessman       Number of Visits: 14  Reason for Discharge:  Early Exit:  due to department closure for COVID-19  Smoking History:  Social History   Tobacco Use  Smoking Status Former Smoker  . Packs/day: 1.00  . Years: 55.00  . Pack years: 55.00  . Types: Cigarettes  . Quit date: 04/22/2011  . Years since quitting: 7.9  Smokeless Tobacco Never Used  Tobacco Comment   occasionally smoked 2-3 times a week--1 cig each time    Diagnosis:  Centrilobular emphysema (HCC)  ADL UCSD:   Initial Exercise Prescription:   Discharge Exercise Prescription (Final Exercise Prescription Changes):   Functional Capacity:   Psychological, QOL, Others - Outcomes: PHQ 2/9: Depression screen [facility] 2/9 07/07/2018 10/03/2017 01/05/2017  Decreased Interest 0 0 0  Down, Depressed, Hopeless 0 0 0  PHQ - 2 Score 0 0 0  Altered sleeping 0 - -  Tired, decreased energy 0 - -  Change in appetite 0 - -  Feeling bad or failure about yourself  0 - -  Trouble concentrating 0 - -  Moving slowly or fidgety/restless 0 - -  Suicidal thoughts 0 - -  PHQ-9 Score 0 - -  Difficult doing work/chores Not difficult at all - -    Quality of Life:   Personal Goals: Goals established at orientation with interventions provided to work toward goal.    Personal Goals Discharge:   Exercise Goals and Review:   Exercise Goals Re-Evaluation:   Nutrition & Weight - Outcomes:    Nutrition:   Nutrition Discharge:   Education Questionnaire Score:   Goals reviewed with patient; copy given to patient.

## 2019-04-05 ENCOUNTER — Encounter: Payer: Self-pay | Admitting: Pulmonary Disease

## 2019-04-05 ENCOUNTER — Other Ambulatory Visit: Payer: Self-pay

## 2019-04-05 ENCOUNTER — Ambulatory Visit (INDEPENDENT_AMBULATORY_CARE_PROVIDER_SITE_OTHER): Payer: Medicare Other | Admitting: Pulmonary Disease

## 2019-04-05 DIAGNOSIS — J432 Centrilobular emphysema: Secondary | ICD-10-CM

## 2019-04-05 DIAGNOSIS — J9611 Chronic respiratory failure with hypoxia: Secondary | ICD-10-CM

## 2019-04-05 DIAGNOSIS — J449 Chronic obstructive pulmonary disease, unspecified: Secondary | ICD-10-CM | POA: Diagnosis not present

## 2019-04-05 NOTE — Progress Notes (Signed)
[name], [name], [name]  Chief Complaint  Patient presents with  . COPD    No improvement since last visit on 02/28/19.    Constitutional:  There were no vitals taken for this visit.  Deferred.  Past Medical History:  Hypothyroidism, HLD, Nephrolithiasis, OA, Squamous cell cancer Lt ear  Brief Summary:  [name] is a 85 y.o. 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  Virtual Visit via Telephone Note  I connected with [name] on 04/05/19 at 10:30 AM EDT by telephone Sean verified that I am speaking with the correct person using two identifiers.  Location: Patient: home Provider: medical office   I discussed the limitations, risks, security Sean privacy concerns of performing an evaluation Sean management service by telephone Sean the availability of in person appointments. I also discussed with the patient that there may be a patient responsible charge related to this service. The patient expressed understanding Sean agreed to proceed.  Hasn't been able to do [name] rehab due to COVID risk.   Tries to keep up with activity.  Gets winded easily.  Says his SpO2 is usually in 90's.  Wears 2 liters oxygen at night.  Not having cough, wheeze, sputum, or chest congestion.  Physical Exam:   Deferred.  Assessment/Plan:   COPD with emphysema. - prednisone dependent since May 2015 - continue prednisone 2.5 mg daily >> he can't tolerate lower doses - continue stiolto Sean asmanex; gets meds from [place] - he will call if he wants to set up [name] rehab again   Chronic hypoxic respiratory failure. - goal SpO2 > 90% - 2 liters oxygen at night   Patient Instructions  Follow up in 6 months    I discussed the assessment Sean treatment plan with the patient. The patient was provided an opportunity to ask questions Sean all were answered. The patient agreed with the plan Sean  demonstrated an understanding of the instructions.   The patient was advised to call back or seek an in-person evaluation if the symptoms worsen or if the condition fails to improve as anticipated.  I provided 13 minutes of non-face-to-face time during this encounter.    [name], MD [place] [name]/[name] Pager: [phone] 04/05/2019, 11:13 AM  Flow Sheet     [name] tests:  PFT 07/14/06 >>FEV1 1.41(51%), FEV1% 60, TLC 7.3(123%), DLCO 75%, no BD  PFT 07/29/10 >> FEV1 1.30(50%), FEV1% 50, TLC 6.39(100%), DLCO 68%, no BD Spirometry 11/17/10 >> FEV1 1.15(36%), FEV1% 56 Spirometry 10/26/13 >> FEV1 0.92 (30%), FEV1% 39 Spirometry 04/07/15 >> FEV1 0.91, FEV1% 42  Chest imaging:  CT angio chest 07/14/15 >> atherosclerosis, several nodules  Sleep tests:  ONO with RA 11/30/12 >>Test time 9 hrs 30 min. Mean SpO2 92.8%, low SpO2 86%. Spent 3 min with SpO2 <88% ONO with 2 liters 01/16/14 >>test time 8 hrs 36 min. Basal SpO2 93%, low SpO2 85%. Spent 4 min with SpO2 <88%.  Cardiac tests:  Myocardial perfusion scan 03/26/19 >> ECG non diagnostic, EF 44%, no areas of ischemia Echo 04/02/19 >> EF 55 to 60%, mild LVH, grade 1 DD, mild AR  Medications:   Allergies as of 04/05/2019      Reactions   Sulfa Antibiotics    Sulfonamide Derivatives    Unknown - as a child      Medication List       Accurate as of April 05, 2019 11:13 AM. If you  have any questions, ask your nurse or doctor.        albuterol 108 (90 Base) MCG/ACT inhaler Commonly known as: VENTOLIN HFA Inhale 2 puffs into the lungs every 4 (four) hours as needed for wheezing or shortness of breath. What changed: Another medication with the same name was changed. Make sure you understand how Sean when to take each.   albuterol (2.5 MG/3ML) 0.083% nebulizer solution Commonly known as: PROVENTIL USE 1 VIAL IN NEBULIZER EVERY 6 HOURS. Generic: VENTOLIN What changed:   how much to take  how to take  this  when to take this  additional instructions   fluorouracil 5 % cream Commonly known as: EFUDEX Apply 1 application topically daily.   guaiFENesin 600 MG 12 hr tablet Commonly known as: Mucinex Take 2 tablets (1,200 mg total) by mouth 2 (two) times daily as needed.   levothyroxine 50 MCG tablet Commonly known as: SYNTHROID Take 50 mcg by mouth daily before breakfast.   mometasone 220 MCG/INH inhaler Commonly known as: ASMANEX Inhale 2 puffs into the lungs daily.   predniSONE 5 MG tablet Commonly known as: DELTASONE TAKE 1 TABLET (5 MG TOTAL) BY MOUTH DAILY WITH BREAKFAST.   predniSONE 5 MG tablet Commonly known as: DELTASONE Take 0.5 tablets (2.5 mg total) by mouth daily with breakfast. Take one half tablet ( 2.5 mg daily) x 30 days at breakfast.   simvastatin 5 MG tablet Commonly known as: ZOCOR Take 5 mg by mouth at bedtime.   Tiotropium Bromide-Olodaterol 2.5-2.5 MCG/ACT Aers Commonly known as: Stiolto Respimat Inhale 2 puffs into the lungs daily.       Past Surgical History:  He  has a past surgical history that includes Tracheostomy; electronavigational bronchoscopy (07/2010); Hip pinning, cannulated (Left, 06/04/2014); Cataract extraction, bilateral; Sean Conversion to total hip (Left, 04/28/2015).  Family History:  His family history includes Cancer in his father; Heart disease in his mother.  Social History:  He  reports that he quit smoking about 7 years ago. His smoking use included cigarettes. He has a 55.00 pack-year smoking history. He has never used smokeless tobacco. He reports current alcohol use. He reports that he does not use drugs.

## 2019-04-05 NOTE — Patient Instructions (Signed)
Follow up in 6 months 

## 2019-04-18 ENCOUNTER — Encounter: Payer: Self-pay | Admitting: Cardiology

## 2019-04-18 ENCOUNTER — Other Ambulatory Visit: Payer: Self-pay

## 2019-04-18 ENCOUNTER — Telehealth (INDEPENDENT_AMBULATORY_CARE_PROVIDER_SITE_OTHER): Payer: Medicare Other | Admitting: Cardiology

## 2019-04-18 VITALS — HR 81 | Ht 68.0 in | Wt 172.0 lb

## 2019-04-18 DIAGNOSIS — R0609 Other forms of dyspnea: Secondary | ICD-10-CM

## 2019-04-18 DIAGNOSIS — R000 Tachycardia, unspecified: Secondary | ICD-10-CM | POA: Diagnosis not present

## 2019-04-18 DIAGNOSIS — R0600 Dyspnea, unspecified: Secondary | ICD-10-CM

## 2019-04-18 DIAGNOSIS — R6889 Other general symptoms and signs: Secondary | ICD-10-CM

## 2019-04-18 NOTE — Progress Notes (Signed)
Patient referred by [name], MD for tachycardia, decreased exercise tolerance  Subjective:   [name], male    DOB: 10-30-1927, 85 y.o.   MRN: [id number]  I connected with the patient on 04/18/2019 by a video enabled telemedicine application and verified that I am speaking with the correct person using two identifiers.     I discussed the limitations of evaluation and management by telemedicine and the availability of in person appointments. The patient expressed understanding and agreed to proceed.   This visit type was conducted due to national recommendations for restrictions regarding the COVID-19 Pandemic (e.g. social distancing).  This format is felt to be most appropriate for this patient at this time.  All issues noted in this document were discussed and addressed.  No physical exam was performed (except for noted visual exam findings with Tele health visits).  The patient has consented to conduct a Tele health visit and understands insurance will be billed.    Chief Complaint  Patient presents with  . Tachycardia  . 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  Echocardiogram was limited due to poor echo windows.  However, it showed EF of 55 to 08%, grade 1 diastolic dysfunction, mild aortic regurgitation.  Lexiscan stress test showed mildly decreased perfusion uptake in inferior myocardium, likely due to gut artifact.  Stress LVEF was 44%.  Stress test was intermediate risk.  He again denies any chest pain. His resting HR is around 80 bpm, and reportedly increased to about 100 bpm with activity, which is not unusual.  Initial consult visit: Patient has been seeing pulmonologist at [facility] pulmonology for COPD management.  He has  been on 2 L oxygen at home.  Over the last 1 year, he has noticed decreased exercise tolerance, exertional dyspnea and increasing heart rate with minimal activity.  He states that his COPD has overall been stable.  He denies any chest pain.  Also denies orthopnea, PND, leg edema.  He has history of multinodular goiter, but has reportedly had stable thyroid function over the years.  Past Medical History:  Diagnosis Date  . Arthritis    osteoarthritis  . Cancer ([place])    skin cancer only.-no melanoma.  Marland Kitchen COPD (chronic obstructive pulmonary disease) ([place])   . History of kidney stones    lithotripsy x1   . History of oxygen administration    Oxygen @ 2 l/m nasally bedtime  . Hyperlipidemia   . Hypothyroidism   . Pulmonary nodule, right   . 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 Tobacco abuse    Quit 09/17/10     Past Surgical History:  Procedure Laterality Date  . CATARACT EXTRACTION, BILATERAL    . CONVERSION TO TOTAL HIP Left 04/28/2015   Procedure: LEFT HIP CONVERSION PREVIOUS HIP SURGERY TO TOTAL HIP ARTHROPLASTY (ANTERIOR);  Surgeon: Paralee Cancel, MD;  Location: [facility];  Service: Orthopedics;  Laterality: Left;  . electronavigational bronchoscopy  07/2010  . HIP PINNING,CANNULATED Left 06/04/2014   Procedure: LEFT HIP REDUCTION WITH PERCUTANEOUS SCREWS;  Surgeon: Mauri Pole, MD;  Location: [place];  Service: Orthopedics;  Laterality: Left;  . TRACHEOSTOMY     after aspiration event as a 85 y/o     Social History   Socioeconomic History  .  Marital status: Married    Spouse name: Not on file  . Number of children: 4  . Years of education: Not on file  . Highest education level: Not on file  Occupational History  . Not on file  Social Needs  . Financial resource strain: Not on file  . Food insecurity    Worry: Not on file    Inability: Not on file  . Transportation needs    Medical: Not on file    Non-medical: Not on file  Tobacco Use  . Smoking  status: Former Smoker    Packs/day: 1.00    Years: 55.00    Pack years: 55.00    Types: Cigarettes    Quit date: 04/22/2011    Years since quitting: 7.9  . Smokeless tobacco: Never Used  . Tobacco comment: occasionally smoked 2-3 times a week--1 cig each time  Substance and Sexual Activity  . Alcohol use: Yes    Comment: rare  social  . Drug use: No  . Sexual activity: Not on file  Lifestyle  . Physical activity    Days per week: Not on file    Minutes per session: Not on file  . Stress: Not on file  Relationships  . Social Herbalist on phone: Not on file    Gets together: Not on file    Attends religious service: Not on file    Active member of club or organization: Not on file    Attends meetings of clubs or organizations: Not on file    Relationship status: Not on file  . Intimate partner violence    Fear of current or ex partner: No    Emotionally abused: No    Physically abused: No    Forced sexual activity: No  Other Topics Concern  . Not on file  Social History Narrative  . Not on file     Family History  Problem Relation Age of Onset  . Heart disease Mother   . Cancer Father      Current Outpatient Medications on File Prior to Visit  Medication Sig Dispense Refill  . albuterol (PROVENTIL HFA;VENTOLIN HFA) 108 (90 BASE) MCG/ACT inhaler Inhale 2 puffs into the lungs every 4 (four) hours as needed for wheezing or shortness of breath.    Marland Kitchen albuterol (PROVENTIL) (2.5 MG/3ML) 0.083% nebulizer solution USE 1 VIAL IN NEBULIZER EVERY 6 HOURS. Generic: VENTOLIN (Patient taking differently: Take 2.5 mg by nebulization 3 (three) times daily. ) 120 vial 10  . fluorouracil (EFUDEX) 5 % cream Apply 1 application topically daily.    Marland Kitchen guaiFENesin (MUCINEX) 600 MG 12 hr tablet Take 2 tablets (1,200 mg total) by mouth 2 (two) times daily as needed.    Marland Kitchen levothyroxine (SYNTHROID, LEVOTHROID) 50 MCG tablet Take 50 mcg by mouth daily before breakfast.     .  mometasone (ASMANEX) 220 MCG/INH inhaler Inhale 2 puffs into the lungs daily.    . predniSONE (DELTASONE) 5 MG tablet TAKE 1 TABLET (5 MG TOTAL) BY MOUTH DAILY WITH BREAKFAST. 30 tablet 3  . predniSONE (DELTASONE) 5 MG tablet Take 0.5 tablets (2.5 mg total) by mouth daily with breakfast. Take one half tablet ( 2.5 mg daily) x 30 days at breakfast. 15 tablet 1  . simvastatin (ZOCOR) 5 MG tablet Take 5 mg by mouth at bedtime.      . Tiotropium Bromide-Olodaterol (STIOLTO RESPIMAT) 2.5-2.5 MCG/ACT AERS Inhale 2 puffs into the lungs daily. 1 Inhaler 6  No current facility-administered medications on file prior to visit.     Cardiovascular studies:  Echocardiogram 04/02/2019:  Poor echo window. Wall motion abnormality has reduced sensitivity. Normal LV systolic function with visual EF 55-60%. Left ventricle cavity is small. Mild concentric hypertrophy of the left ventricle. Normal global wall motion. Doppler evidence of grade I (impaired) diastolic dysfunction, normal LAP. Aortic valve is not well visualized. Mild (Grade I) aortic regurgitation. No evidence of aortic valve stenosis. Mild calcification of the mitral valve annulus. Trace mitral regurgitation.  Lexiscan Myoview stress test 03/26/2019: Lexiscan stress test was performed. Stress EKG is non-diagnostic, as this is pharmacological stress test. Normal wall thickening. Stress LVEF calculated 44%, although visually appears normal. SPECT stress and rest images reveal a small area of mildly decreased perfusion uptake in inferior myocardium. This likely represents gut artifact.   Intermediate risk stress test due to reduced LVEF. Recommend correlation with echocardiogram.   EKG 03/22/2019: Sinus rhythm 74 bpm. Poor R-wave progression.  Recent labs: 10/25/2018: Glucose 107. BUN/Cr 11/1.1 eGFR 56. Na/K 140/4.8. Rest of the CMP normal. Chol 139, TG 93, HDL 51, LDL 69.  05/10/2018: H/H 13/39, MCV 90. Platelets 298. TSH 2.4. Normal total T4.    Review of Systems  Constitution: Negative for decreased appetite, malaise/fatigue, weight gain and weight loss.  HENT: Negative for congestion.   Eyes: Negative for visual disturbance.  Cardiovascular: Positive for dyspnea on exertion. Negative for chest pain, leg swelling, palpitations and syncope.  Respiratory: Positive for shortness of breath. Negative for cough.   Endocrine: Negative for cold intolerance.  Hematologic/Lymphatic: Does not bruise/bleed easily.  Skin: Positive for skin cancer. Negative for itching and rash.  Musculoskeletal: Negative for myalgias.  Gastrointestinal: Negative for abdominal pain, nausea and vomiting.  Genitourinary: Negative for dysuria.  Neurological: Negative for dizziness and weakness.  Psychiatric/Behavioral: The patient is not nervous/anxious.   All other systems reviewed and are negative.        Vitals:   04/18/19 1404  Pulse: 81  SpO2: 98%     Body mass index is 26.15 kg/m. Filed Weights   04/18/19 1404  Weight: 78 kg    Objective:   Physical Exam  Constitutional: He is oriented to person, place, and time. He appears well-developed and well-nourished. No distress.  Pulmonary/Chest: Effort normal.  Neurological: He is alert and oriented to person, place, and time.  Psychiatric: He has a normal mood and affect.  Nursing note and vitals reviewed.         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  Exertional dyspnea, decreased exercise tolerance, tachycardia: No clear anginal symptoms.  Normal resting EKG.  No heart failure on exam.  Echocardiogram showed normal EF, although stress LVEF was reduced.  Lexiscan stress test showed mild gut attenuation in the inferior myocardium.  While patient could have mild CAD, suspicion for obstructive CAD causing his symptoms is low. In absence of any angina symptoms, I do not recommend invasive workup at this time, Continue pulmonary rehab.   Nigel Mormon, MD [facility] Pager: [phone] Office: [phone] If no answer Cell [phone]

## 2019-05-28 ENCOUNTER — Other Ambulatory Visit: Payer: Self-pay | Admitting: Acute Care

## 2019-05-28 DIAGNOSIS — J441 Chronic obstructive pulmonary disease with (acute) exacerbation: Secondary | ICD-10-CM

## 2019-07-04 ENCOUNTER — Ambulatory Visit: Payer: Medicare Other | Attending: Internal Medicine

## 2019-07-04 DIAGNOSIS — Z23 Encounter for immunization: Secondary | ICD-10-CM

## 2019-07-04 NOTE — Progress Notes (Signed)
   Covid-19 Vaccination Clinic  Name:  BOYD MAGSINO    MRN: [id number] DOB: [birth year]  07/04/2019  Mr. Notaro was observed post Covid-19 immunization for 15 minutes without incidence. He was provided with Vaccine Information Sheet and instruction to access the V-Safe system.   Mr. Ohora was instructed to call 911 with any severe reactions post vaccine: Marland Kitchen Difficulty breathing  . Swelling of your face and throat  . A fast heartbeat  . A bad rash all over your body  . Dizziness and weakness    Immunizations Administered    Name Date Dose VIS Date Route   Pfizer COVID-19 Vaccine 07/04/2019 10:28 AM 0.3 mL 06/01/2019 Intramuscular   Manufacturer: [place]   Lot: [id number]   [place]: [id number]

## 2019-07-20 ENCOUNTER — Other Ambulatory Visit: Payer: Self-pay | Admitting: Pulmonary Disease

## 2019-07-20 DIAGNOSIS — J441 Chronic obstructive pulmonary disease with (acute) exacerbation: Secondary | ICD-10-CM

## 2019-07-24 ENCOUNTER — Ambulatory Visit: Payer: Medicare Other | Attending: Internal Medicine

## 2019-07-24 DIAGNOSIS — Z23 Encounter for immunization: Secondary | ICD-10-CM | POA: Insufficient documentation

## 2019-07-24 NOTE — Progress Notes (Signed)
   Covid-19 Vaccination Clinic  Name:  [name]    MRN: [id number] DOB: 1927/10/03  07/24/2019  [name] was observed post Covid-19 immunization for 30 minutes based on pre-vaccination screening without incidence. He was provided with Vaccine Information Sheet and instruction to access the V-Safe system.   [name] was instructed to call 911 with any severe reactions post vaccine: Marland Kitchen Difficulty breathing  . Swelling of your face and throat  . A fast heartbeat  . A bad rash all over your body  . Dizziness and weakness    Immunizations Administered    Name Date Dose VIS Date Route   Pfizer COVID-19 Vaccine 07/24/2019  8:27 AM 0.3 mL 06/01/2019 Intramuscular   Manufacturer: [place]   Lot: [id number]   [employer]: [id number]

## 2019-10-12 ENCOUNTER — Ambulatory Visit (INDEPENDENT_AMBULATORY_CARE_PROVIDER_SITE_OTHER): Payer: Medicare Other | Admitting: Pulmonary Disease

## 2019-10-12 ENCOUNTER — Encounter: Payer: Self-pay | Admitting: Pulmonary Disease

## 2019-10-12 ENCOUNTER — Other Ambulatory Visit: Payer: Self-pay

## 2019-10-12 VITALS — BP 140/58 | HR 71 | Temp 97.6°F | Ht 69.0 in | Wt 167.4 lb

## 2019-10-12 DIAGNOSIS — J432 Centrilobular emphysema: Secondary | ICD-10-CM

## 2019-10-12 DIAGNOSIS — J9611 Chronic respiratory failure with hypoxia: Secondary | ICD-10-CM | POA: Diagnosis not present

## 2019-10-12 NOTE — Progress Notes (Signed)
[place] [facility], [facility], and Sleep Medicine  Chief Complaint  Patient presents with  . Follow-up    2L O2 at night, SOB with exertion    Constitutional:  BP (!) 140/58 (BP Location: Left Arm, Cuff Size: Normal)   Pulse 71   Temp 97.6 F (36.4 C) (Temporal)   Ht 5\' 9"  (1.753 m)   Wt 167 lb 6.4 oz (75.9 kg)   SpO2 100%   BMI 24.72 kg/m    Past Medical History:  Hypothyroidism, HLD, Nephrolithiasis, OA, Squamous cell cancer Lt ear  Brief Summary:  [name] is a 86 y.o. 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  Subjective:  Breathing has been stable.  Remains on 2.5 mg prednisone.  Gets winded when working in the yard.  Lost about 5 lbs since last visit.  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  Uses 2 liters oxygen at night.  He received Pfizer COVID vaccine.  Physical Exam:   Appearance - well kempt, thin  ENMT - no sinus tenderness, no oral exudate, no LAN, Mallampati 2airway, no stridor, bandage over scalp area  Respiratory - equal breath sounds bilaterally, decreased BS, prolonged exhalation, no wheezing or rales  CV - s1s2 regular rate and rhythm, no murmurs  Ext - no clubbing, no edema  Skin - multiple areas of ecchymosis  Psych - normal mood and affect    Assessment/Plan:   COPD with emphysema. - prednisone dependent since May 2015 - continue prednisone 2.5 mg daily >> can't tolerate dose lower than this - gets refills from [place] - continue stiolto and asmanex - he would like to defer pulmonary rehab referral at this time  Chronic hypoxic respiratory failure. - goal SpO2 > 90% - 2 liters oxygen at night and prn during the day  Chronic osteomyelitis of skull. - followed by Dr. Luetta Nutting with Plastic surgery and Dr. Mariella Saa with ID [facility]  A total of  22 minutes spent addressing patient care issues on day of  visit.   Follow up:   Patient Instructions  Follow up in 6 months   Signature:  [name], MD [place] Pager: [phone] 10/12/2019, 10:15 AM  Flow Sheet     Pulmonary tests:  PFT 07/14/06 >>FEV1 1.41(51%), FEV1% 60, TLC 7.3(123%), DLCO 75%, no BD  PFT 07/29/10 >> FEV1 1.30(50%), FEV1% 50, TLC 6.39(100%), DLCO 68%, no BD Spirometry 11/17/10 >> FEV1 1.15(36%), FEV1% 56 Spirometry 10/26/13 >> FEV1 0.92 (30%), FEV1% 39 Spirometry 04/07/15 >> FEV1 0.91, FEV1% 42  Chest imaging:  CT angio chest 07/14/15 >> atherosclerosis, several nodules  Sleep tests:  ONO with RA 11/30/12 >>Test time 9 hrs 30 min. Mean SpO2 92.8%, low SpO2 86%. Spent 3 min with SpO2 <88% ONO with 2 liters 01/16/14 >>test time 8 hrs 36 min. Basal SpO2 93%, low SpO2 85%. Spent 4 min with SpO2 <88%.  Cardiac tests:  Myocardial perfusion scan 03/26/19 >> ECG non diagnostic, EF 44%, no areas of ischemia Echo 04/02/19 >> EF 55 to 60%, mild LVH, grade 1 DD, mild AR  Medications:   Allergies as of 10/12/2019      Reactions   Sulfa Antibiotics    Sulfonamide Derivatives    Unknown - as a child      Medication List       Accurate as of October 12, 2019 10:15 AM. If you have any questions, ask  your nurse or doctor.        albuterol 108 (90 Base) MCG/ACT inhaler Commonly known as: VENTOLIN HFA Inhale 2 puffs into the lungs every 4 (four) hours as needed for wheezing or shortness of breath. What changed: Another medication with the same name was changed. Make sure you understand how and when to take each.   albuterol (2.5 MG/3ML) 0.083% nebulizer solution Commonly known as: PROVENTIL USE 1 VIAL IN NEBULIZER EVERY 6 HOURS. Generic: VENTOLIN What changed:   how much to take  how to take this  when to take this  additional instructions   doxycycline 100 MG tablet Commonly known as: VIBRA-TABS Take by mouth.   fluorouracil 5 % cream Commonly known as: EFUDEX Apply 1  application topically daily.   guaiFENesin 600 MG 12 hr tablet Commonly known as: Mucinex Take 2 tablets (1,200 mg total) by mouth 2 (two) times daily as needed.   levothyroxine 50 MCG tablet Commonly known as: SYNTHROID Take 50 mcg by mouth daily before breakfast.   mometasone 220 MCG/INH inhaler Commonly known as: ASMANEX Inhale 2 puffs into the lungs daily.   predniSONE 5 MG tablet Commonly known as: DELTASONE TAKE 1/2 TABLET EVERY DAY FOR BREAKFAST   simvastatin 5 MG tablet Commonly known as: ZOCOR Take 5 mg by mouth at bedtime.   Tiotropium Bromide-Olodaterol 2.5-2.5 MCG/ACT Aers Commonly known as: Stiolto Respimat Inhale 2 puffs into the lungs daily.       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  Family History:  His family history includes Cancer in his father; Heart disease in his mother.  Social History:  He  reports that he quit smoking about 8 years ago. His smoking use included cigarettes. He has a 55.00 pack-year smoking history. He has never used smokeless tobacco. He reports current alcohol use. He reports that he does not use drugs.

## 2019-10-12 NOTE — Patient Instructions (Signed)
Follow up in 6 months 

## 2019-10-24 ENCOUNTER — Other Ambulatory Visit: Payer: Self-pay | Admitting: Pulmonary Disease

## 2019-10-24 DIAGNOSIS — J441 Chronic obstructive pulmonary disease with (acute) exacerbation: Secondary | ICD-10-CM

## 2020-02-17 ENCOUNTER — Other Ambulatory Visit: Payer: Self-pay | Admitting: Pulmonary Disease

## 2020-02-17 DIAGNOSIS — J441 Chronic obstructive pulmonary disease with (acute) exacerbation: Secondary | ICD-10-CM

## 2020-03-25 ENCOUNTER — Ambulatory Visit: Payer: Medicare Other | Attending: Internal Medicine

## 2020-03-25 DIAGNOSIS — Z23 Encounter for immunization: Secondary | ICD-10-CM

## 2020-03-25 NOTE — Progress Notes (Signed)
   Covid-19 Vaccination Clinic  Name:  [name]    MRN: [id number] DOB: 1928/06/12  03/25/2020  [name] was observed post Covid-19 immunization for 15 minutes without incident. He was provided with Vaccine Information Sheet and instruction to access the V-Safe system.   [name] was instructed to call 911 with any severe reactions post vaccine: Marland Kitchen Difficulty breathing  . Swelling of face and throat  . A fast heartbeat  . A bad rash all over body  . Dizziness and weakness

## 2020-04-23 ENCOUNTER — Encounter: Payer: Self-pay | Admitting: Pulmonary Disease

## 2020-04-23 ENCOUNTER — Ambulatory Visit (INDEPENDENT_AMBULATORY_CARE_PROVIDER_SITE_OTHER): Payer: Medicare Other | Admitting: Pulmonary Disease

## 2020-04-23 ENCOUNTER — Other Ambulatory Visit: Payer: Self-pay

## 2020-04-23 VITALS — BP 132/64 | HR 71 | Ht 69.0 in | Wt 163.6 lb

## 2020-04-23 DIAGNOSIS — J432 Centrilobular emphysema: Secondary | ICD-10-CM | POA: Diagnosis not present

## 2020-04-23 NOTE — Patient Instructions (Signed)
Follow up in 6 months 

## 2020-04-23 NOTE — Progress Notes (Signed)
[place] [facility], Critical Care, and Sleep Medicine  Chief Complaint  Patient presents with  . Follow-up    Pt states he is about the same since last visit. States he does become SOB more quickly now. Pt also has an occ cough. Denies any complaints of wheezing or chest discomfort.    Constitutional:  BP 132/64 (BP Location: Left Arm, Cuff Size: Normal)   Pulse 71   Ht 5\' 9"  (1.753 m)   Wt 163 lb 9.6 oz (74.2 kg)   SpO2 100%   BMI 24.16 kg/m   Past Medical History:  Hypothyroidism, HLD, Nephrolithiasis, OA, Squamous cell cancer Lt ear  Past Surgical History:  His  has a past surgical history that includes Tracheostomy; electronavigational bronchoscopy (07/2010); Hip pinning, cannulated (Left, 06/04/2014); Cataract extraction, bilateral; and Conversion to total hip (Left, 04/28/2015).  Brief Summary:  [name] is a 86 y.o. 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      Subjective:   His breathing has been stable.  He gets winded with activity, but recovers after resting a few minutes.  Not having much cough or sputum.  Denies chest pain or leg swelling.  Physical Exam:   Appearance - well kempt, bandage over scalp  ENMT - no sinus tenderness, no oral exudate, no LAN, Mallampati 2 airway, no stridor  Respiratory - decreased breath sounds bilaterally, no wheezing or rales  CV - s1s2 regular rate and rhythm, no murmurs  Ext - no clubbing, no edema  Skin - no rashes  Psych - normal mood and affect   Pulmonary testing:   PFT 07/14/06 >>FEV1 1.41(51%), FEV1% 60, TLC 7.3(123%), DLCO 75%, no BD   PFT 07/29/10 >> FEV1 1.30(50%), FEV1% 50, TLC 6.39(100%), DLCO 68%, no BD  Spirometry 11/17/10 >> FEV1 1.15(36%), FEV1% 56  Spirometry 10/26/13 >> FEV1 0.92 (30%), FEV1% 39  Spirometry 04/07/15 >> FEV1 0.91, FEV1% 42  Chest Imaging:   CT angio chest 07/14/15 >> atherosclerosis, several  nodules  Sleep Tests:   ONO with RA 11/30/12 >>Test time 9 hrs 30 min. Mean SpO2 92.8%, low SpO2 86%. Spent 3 min with SpO2 <88%  ONO with 2 liters 01/16/14 >>test time 8 hrs 36 min. Basal SpO2 93%, low SpO2 85%. Spent 4 min with SpO2 <88%.  Cardiac Tests:   Myocardial perfusion scan 03/26/19 >> ECG non diagnostic, EF 44%, no areas of ischemia  Echo 04/02/19 >> EF 55 to 60%, mild LVH, grade 1 DD, mild AR  Social History:  He  reports that he quit smoking about 9 years ago. His smoking use included cigarettes. He has a 55.00 pack-year smoking history. He has never used smokeless tobacco. He reports current alcohol use. He reports that he does not use drugs.  Family History:  His family history includes Cancer in his father; Heart disease in his mother.    Discussion:    Assessment/Plan:   COPD with emphysema. - prednisone dependent since May 2015 - continue prednisone 2.5 mg daily >> can't tolerate dose lower than this - gets refills from [place] - continue stiolto and asmanex  Chronic hypoxic respiratory failure. - goal SpO2 > 90% - 2 liters oxygen at night and prn during the day  Chronic osteomyelitis of skull. - he is now on lifelong doxycycline prophylaxis - followed by Dr. Luetta Nutting with Plastic surgery and Dr. Mariella Saa with ID at [facility]  Time Spent Involved in Patient Care on Day of Examination:  22 minutes  Follow up:  Patient Instructions  Follow up in 6 months   Medication List:   Allergies as of 04/23/2020      Reactions   Sulfa Antibiotics    Sulfonamide Derivatives    Unknown - as a child      Medication List       Accurate as of April 23, 2020  9:53 AM. If you have any questions, ask your nurse or doctor.        STOP taking these medications   simvastatin 5 MG tablet Commonly known as: ZOCOR Stopped by: [name], MD     TAKE these medications   albuterol 108 (90 Base) MCG/ACT inhaler Commonly known as: VENTOLIN HFA Inhale 2 puffs  into the lungs every 4 (four) hours as needed for wheezing or shortness of breath. What changed: Another medication with the same name was changed. Make sure you understand how and when to take each.   albuterol (2.5 MG/3ML) 0.083% nebulizer solution Commonly known as: PROVENTIL USE 1 VIAL IN NEBULIZER EVERY 6 HOURS. Generic: VENTOLIN What changed:   how much to take  how to take this  when to take this  additional instructions   doxycycline 100 MG tablet Commonly known as: VIBRA-TABS Take by mouth.   guaiFENesin 600 MG 12 hr tablet Commonly known as: Mucinex Take 2 tablets (1,200 mg total) by mouth 2 (two) times daily as needed.   levothyroxine 50 MCG tablet Commonly known as: SYNTHROID Take 50 mcg by mouth daily before breakfast.   mometasone 220 MCG/INH inhaler Commonly known as: ASMANEX Inhale 2 puffs into the lungs daily.   predniSONE 5 MG tablet Commonly known as: DELTASONE TAKE 1/2 TABLET EVERY DAY FOR BREAKFAST   Tiotropium Bromide-Olodaterol 2.5-2.5 MCG/ACT Aers Commonly known as: Stiolto Respimat Inhale 2 puffs into the lungs daily.       Signature:  [name], MD [place] Pager - [phone] 04/23/2020, 9:53 AM

## 2020-06-03 ENCOUNTER — Other Ambulatory Visit: Payer: Self-pay | Admitting: Pulmonary Disease

## 2020-06-03 DIAGNOSIS — J441 Chronic obstructive pulmonary disease with (acute) exacerbation: Secondary | ICD-10-CM

## 2020-09-03 ENCOUNTER — Other Ambulatory Visit: Payer: Self-pay | Admitting: Pulmonary Disease

## 2020-09-26 ENCOUNTER — Other Ambulatory Visit: Payer: Self-pay | Admitting: Pulmonary Disease

## 2020-09-26 DIAGNOSIS — J441 Chronic obstructive pulmonary disease with (acute) exacerbation: Secondary | ICD-10-CM

## 2020-11-11 ENCOUNTER — Other Ambulatory Visit: Payer: Self-pay

## 2020-11-11 ENCOUNTER — Encounter (HOSPITAL_BASED_OUTPATIENT_CLINIC_OR_DEPARTMENT_OTHER): Payer: Medicare Other | Attending: Internal Medicine | Admitting: Internal Medicine

## 2020-11-11 DIAGNOSIS — J449 Chronic obstructive pulmonary disease, unspecified: Secondary | ICD-10-CM | POA: Insufficient documentation

## 2020-11-11 DIAGNOSIS — Z882 Allergy status to sulfonamides status: Secondary | ICD-10-CM | POA: Insufficient documentation

## 2020-11-11 DIAGNOSIS — Z85828 Personal history of other malignant neoplasm of skin: Secondary | ICD-10-CM | POA: Insufficient documentation

## 2020-11-11 DIAGNOSIS — Z87891 Personal history of nicotine dependence: Secondary | ICD-10-CM | POA: Diagnosis not present

## 2020-11-11 DIAGNOSIS — J9611 Chronic respiratory failure with hypoxia: Secondary | ICD-10-CM | POA: Insufficient documentation

## 2020-11-11 DIAGNOSIS — Z9981 Dependence on supplemental oxygen: Secondary | ICD-10-CM | POA: Diagnosis not present

## 2020-11-11 DIAGNOSIS — M8668 Other chronic osteomyelitis, other site: Secondary | ICD-10-CM | POA: Insufficient documentation

## 2020-11-11 DIAGNOSIS — Z923 Personal history of irradiation: Secondary | ICD-10-CM | POA: Insufficient documentation

## 2020-11-11 DIAGNOSIS — S0100XA Unspecified open wound of scalp, initial encounter: Secondary | ICD-10-CM | POA: Insufficient documentation

## 2020-11-11 DIAGNOSIS — Y838 Other surgical procedures as the cause of abnormal reaction of the patient, or of later complication, without mention of misadventure at the time of the procedure: Secondary | ICD-10-CM | POA: Insufficient documentation

## 2020-11-11 NOTE — Progress Notes (Signed)
HURSCHEL, PAYNTER ([id number]) Visit Report for 11/11/2020 Abuse/Suicide Risk Screen Details Patient Name: Date of Service: Sean [place] [name] 11/11/2020 9:00 A M Medical Record Number: [id number] Patient Account Number: [id number] Date of Birth/Sex: Treating RN: 1928/06/13 (87 y.o. Male) Baruch Gouty Primary Care Merve Hotard: [place], [place] Other Clinician: Referring Oddie Kuhlmann: Treating Darin Arndt/Extender: Migdalia Dk, [place] Weeks in Treatment: 0 Abuse/Suicide Risk Screen Items Answer ABUSE RISK SCREEN: Has anyone close to you tried to hurt or harm you recentlyo No Do you feel uncomfortable with anyone in your familyo No Has anyone forced you do things that you didnt want to doo No Electronic Signature(s) Signed: 11/11/2020 5:08:08 PM By: Baruch Gouty RN, BSN Entered By: Baruch Gouty on 11/11/2020 09:55:50 -------------------------------------------------------------------------------- Activities of Daily Living Details Patient Name: Date of Service: Sean [place] [name] 11/11/2020 9:00 [place] Record Number: [id number] Patient Account Number: [id number] Date of Birth/Sex: Treating RN: May 23, 1928 (87 y.o. Male) Baruch Gouty Primary Care Takeia Ciaravino: [place], [place] Other Clinician: Referring Airon Sahni: Treating Larya Charpentier/Extender: Migdalia Dk, [place] Weeks in Treatment: 0 Activities of Daily Living Items Answer Activities of Daily Living (Please select one for each item) Drive Automobile Completely Able T Medications ake Completely Able Use T elephone Completely Able Care for Appearance Completely Able Use T oilet Completely Able Bath / Shower Completely Able Dress Self Completely Able Feed Self Completely Able Walk Completely Able Get In / Out Bed Completely Able Housework Completely Able Prepare Meals Completely Able Handle Money Completely Able Shop for Self Completely Able Electronic Signature(s) Signed: 11/11/2020 5:08:08 PM  By: Baruch Gouty RN, BSN Entered By: Baruch Gouty on 11/11/2020 09:56:18 -------------------------------------------------------------------------------- Education Screening Details Patient Name: Date of Service: [name], [name] [name] 11/11/2020 9:00 [place] Record Number: [id number] Patient Account Number: [id number] Date of Birth/Sex: Treating RN: 04/23/1928 (87 y.o. Male) Baruch Gouty Primary Care Keasha Malkiewicz: [place], [place] Other Clinician: Referring Kodee Ravert: Treating Walt Geathers/Extender: Migdalia Dk, [place] Weeks in Treatment: 0 Primary Learner Assessed: Patient Learning Preferences/Education Level/Primary Language Learning Preference: Explanation, Demonstration, Printed Material Highest Education Level: College or Above Preferred Language: English Cognitive Barrier Language Barrier: No Translator Needed: No Memory Deficit: No Emotional Barrier: No Cultural/Religious Beliefs Affecting Medical Care: No Physical Barrier Impaired Vision: Yes Glasses, reading Impaired Hearing: No Decreased Hand dexterity: No Knowledge/Comprehension Knowledge Level: High Comprehension Level: High Ability to understand written instructions: High Ability to understand verbal instructions: High Motivation Anxiety Level: Calm Cooperation: Cooperative Education Importance: Acknowledges Need Interest in Health Problems: Asks Questions Perception: Coherent Willingness to Engage in Self-Management High Activities: Readiness to Engage in Self-Management High Activities: Electronic Signature(s) Signed: 11/11/2020 5:08:08 PM By: Baruch Gouty RN, BSN Entered By: Baruch Gouty on 11/11/2020 09:57:30 -------------------------------------------------------------------------------- Fall Risk Assessment Details Patient Name: Date of Service: [name], [name] [name] 11/11/2020 9:00 A M Medical Record Number: [id number] Patient Account Number: [id number] Date of  Birth/Sex: Treating RN: 03/31/1928 (87 y.o. Male) Baruch Gouty Primary Care Reagan Klemz: [place], [place] Other Clinician: Referring Terrilyn Tyner: Treating Kamia Insalaco/Extender: Migdalia Dk, [place] Weeks in Treatment: 0 Fall Risk Assessment Items Have you had 2 or more falls in the last 12 monthso 0 No Have you had any fall that resulted in injury in the last 12 monthso 0 No FALLS RISK SCREEN History of falling - immediate or within 3 months 25 Yes Secondary diagnosis (Do you have 2 or more medical diagnoseso) 0 No Ambulatory aid  None/bed rest/wheelchair/nurse 0 Yes Crutches/cane/walker 0 No Furniture 0 No Intravenous therapy Access/Saline/Heparin Lock 0 No Gait/Transferring Normal/ bed rest/ wheelchair 0 Yes Weak (short steps with or without shuffle, stooped but able to lift head while walking, may seek 0 No support from furniture) Impaired (short steps with shuffle, may have difficulty arising from chair, head down, impaired 0 No balance) Mental Status Oriented to own ability 0 Yes Electronic Signature(s) Signed: 11/11/2020 5:08:08 PM By: Baruch Gouty RN, BSN Entered By: Baruch Gouty on 11/11/2020 09:58:10 -------------------------------------------------------------------------------- Foot Assessment Details Patient Name: Date of Service: [name], [name] [name] 11/11/2020 9:00 [place] Record Number: [id number] Patient Account Number: [id number] Date of Birth/Sex: Treating RN: 1927/12/31 (87 y.o. Male) Baruch Gouty Primary Care Georgeanne Frankland: [place], [place] Other Clinician: Referring Corinda Ammon: Treating Pammy Vesey/Extender: Migdalia Dk, [place] Weeks in Treatment: 0 Foot Assessment Items Site Locations + = Sensation present, - = Sensation absent, C = Callus, U = Ulcer R = Redness, W = Warmth, M = Maceration, PU = Pre-ulcerative lesion F = Fissure, S = Swelling, D = Dryness Assessment Right: Left: Other Deformity: No No Prior Foot Ulcer: No  No Prior Amputation: No No Charcot Joint: No No Ambulatory Status: Ambulatory Without Help Gait: Steady Electronic Signature(s) Signed: 11/11/2020 5:08:08 PM By: Baruch Gouty RN, BSN Entered By: Baruch Gouty on 11/11/2020 09:59:12 -------------------------------------------------------------------------------- Nutrition Risk Screening Details Patient Name: Date of Service: [name], [name] [name] 11/11/2020 9:00 [place] Record Number: [id number] Patient Account Number: [id number] Date of Birth/Sex: Treating RN: 07/01/1927 (87 y.o. Male) Baruch Gouty Primary Care Connie Hilgert: [place], [place] Other Clinician: Referring Charlea Nardo: Treating Hedi Barkan/Extender: Migdalia Dk, [place] Weeks in Treatment: 0 Height (in): 69 Weight (lbs): 161 Body Mass Index (BMI): 23.8 Nutrition Risk Screening Items Score Screening NUTRITION RISK SCREEN: I have an illness or condition that made me change the kind and/or amount of food I eat 0 No I eat fewer than two meals per day 0 No I eat few fruits and vegetables, or milk products 0 No I have three or more drinks of beer, liquor or wine almost every day 0 No I have tooth or mouth problems that make it hard for me to eat 0 No I don't always have enough money to buy the food I need 0 No I eat alone most of the time 0 No I take three or more different prescribed or over-the-counter drugs a day 1 Yes Without wanting to, I have lost or gained 10 pounds in the last six months 0 No I am not always physically able to shop, cook and/or feed myself 0 No Nutrition Protocols Good Risk Protocol 0 No interventions needed Moderate Risk Protocol High Risk Proctocol Risk Level: Good Risk Score: 1 Electronic Signature(s) Signed: 11/11/2020 5:08:08 PM By: Baruch Gouty RN, BSN Entered By: Baruch Gouty on 11/11/2020 09:59:03

## 2020-11-12 NOTE — Progress Notes (Addendum)
[name], [name] ([id number]) Visit Report for 11/11/2020 Allergy List Details Patient Name: Date of Service: [name] [name] 11/11/2020 9:00 A M Medical Record Number: [id number] Patient Account Number: [id number] Date of Birth/Sex: Treating RN: 1928-06-05 (87 y.o. Ernestene Mention Primary Care Teige Rountree: [place] Other Clinician: Referring Landy Dunnavant: Treating Roseanna Koplin/Extender: [name] in Treatment: 0 Allergies Active Allergies Sulfa (Sulfonamide Antibiotics) Reaction: anaphylaxis Allergy Notes Electronic Signature(s) Signed: 11/11/2020 5:08:08 PM By: Baruch Gouty RN, BSN Entered By: Baruch Gouty on 11/11/2020 09:48:57 -------------------------------------------------------------------------------- Arrival Information Details Patient Name: Date of Service: [name], [name] [name] 11/11/2020 9:00 A M Medical Record Number: [id number] Patient Account Number: [id number] Date of Birth/Sex: Treating RN: 09-11-1927 (87 y.o. Ernestene Mention Primary Care Skylarr Liz: [place] Other Clinician: Referring Gola Bribiesca: Treating Talbot Monarch/Extender: [name] in Treatment: 0 Visit Information Patient Arrived: Ambulatory Arrival Time: 09:35 Accompanied By: spouse Transfer Assistance: None Patient Identification Verified: Yes Secondary Verification Process Completed: Yes Patient Requires Transmission-Based Precautions: No Patient Has Alerts: No Electronic Signature(s) Signed: 11/11/2020 5:08:08 PM By: Baruch Gouty RN, BSN Entered By: Baruch Gouty on 11/11/2020 09:40:47 -------------------------------------------------------------------------------- Clinic Level of Care Assessment Details Patient Name: Date of Service: [name], [name] [name] 11/11/2020 9:00 A M Medical Record Number: [id number] Patient Account Number: [id number] Date of Birth/Sex: Treating RN: [birth year] (87 y.o. [name], [name] Primary Care  Xzavian Semmel: [place] Other Clinician: Referring Ananias Kolander: Treating Sunday Klos/Extender: [name] in Treatment: 0 Clinic Level of Care Assessment Items TOOL 2 Quantity Score X- 1 0 Use when only an EandM is performed on the INITIAL visit ASSESSMENTS - Nursing Assessment / Reassessment X- 1 20 General Physical Exam (combine w/ comprehensive assessment (listed just below) when performed on new pt. evals) X- 1 25 Comprehensive Assessment (HX, ROS, Risk Assessments, Wounds Hx, etc.) ASSESSMENTS - Wound and Skin A ssessment / Reassessment X - Simple Wound Assessment / Reassessment - one wound 1 5 X- 1 5 Complex Wound Assessment / Reassessment - multiple wounds X- 1 10 Dermatologic / Skin Assessment (not related to wound area) ASSESSMENTS - Ostomy and/or Continence Assessment and Care []  - 0 Incontinence Assessment and Management []  - 0 Ostomy Care Assessment and Management (repouching, etc.) PROCESS - Coordination of Care []  - 0 Simple Patient / Family Education for ongoing care X- 1 20 Complex (extensive) Patient / Family Education for ongoing care X- 1 10 Staff obtains Programmer, systems, Records, T Results / Process Orders est []  - 0 Staff telephones HHA, Nursing Homes / Clarify orders / etc []  - 0 Routine Transfer to another Facility (non-emergent condition) []  - 0 Routine Hospital Admission (non-emergent condition) X- 1 15 New Admissions / Biomedical engineer / Ordering NPWT Apligraf, etc. , []  - 0 Emergency Hospital Admission (emergent condition) X- 1 10 Simple Discharge Coordination []  - 0 Complex (extensive) Discharge Coordination PROCESS - Special Needs []  - 0 Pediatric / Minor Patient Management []  - 0 Isolation Patient Management []  - 0 Hearing / Language / Visual special needs []  - 0 Assessment of Community assistance (transportation, D/C planning, etc.) []  - 0 Additional assistance / Altered mentation []  - 0 Support  Surface(s) Assessment (bed, cushion, seat, etc.) INTERVENTIONS - Wound Cleansing / Measurement X- 1 5 Wound Imaging (photographs - any number of wounds) []  - 0 Wound Tracing (instead of photographs) X- 1 5 Simple Wound Measurement - one wound []  - 0 Complex Wound Measurement - multiple wounds X- 1 5 Simple Wound  Cleansing - one wound []  - 0 Complex Wound Cleansing - multiple wounds INTERVENTIONS - Wound Dressings X - Small Wound Dressing one or multiple wounds 1 10 []  - 0 Medium Wound Dressing one or multiple wounds []  - 0 Large Wound Dressing one or multiple wounds X- 1 10 Application of Medications - injection INTERVENTIONS - Miscellaneous []  - 0 External ear exam []  - 0 Specimen Collection (cultures, biopsies, blood, body fluids, etc.) []  - 0 Specimen(s) / Culture(s) sent or taken to Lab for analysis []  - 0 Patient Transfer (multiple staff / Harrel Lemon Lift / Similar devices) []  - 0 Simple Staple / Suture removal (25 or less) []  - 0 Complex Staple / Suture removal (26 or more) []  - 0 Hypo / Hyperglycemic Management (close monitor of Blood Glucose) []  - 0 Ankle / Brachial Index (ABI) - do not check if billed separately Has the patient been seen at the hospital within the last three years: Yes Total Score: 155 Level Of Care: New/Established - Level 4 Electronic Signature(s) Signed: 11/12/2020 4:26:30 PM By: Rhae Hammock RN Entered By: Rhae Hammock on 11/11/2020 17:35:46 -------------------------------------------------------------------------------- Encounter Discharge Information Details Patient Name: Date of Service: [name], [name] [name] 11/11/2020 9:00 A M Medical Record Number: [id number] Patient Account Number: [id number] Date of Birth/Sex: Treating RN: [birth year] (87 y.o. Hessie Diener Primary Care Elaya Droege: [place] Other Clinician: Referring Daquan Crapps: Treating Selicia Windom/Extender: [name] in Treatment:  0 Encounter Discharge Information Items Discharge Condition: Stable Ambulatory Status: Ambulatory Discharge Destination: Home Transportation: Private Auto Accompanied By: wife Schedule Follow-up Appointment: Yes Clinical Summary of Care: Electronic Signature(s) Signed: 11/11/2020 5:25:37 PM By: Deon Pilling Entered By: Deon Pilling on 11/11/2020 12:19:05 -------------------------------------------------------------------------------- Lower Extremity Assessment Details Patient Name: Date of Service: [name] [name] 11/11/2020 9:00 A M Medical Record Number: [id number] Patient Account Number: [id number] Date of Birth/Sex: Treating RN: Dec 05, 1927 (87 y.o. Ernestene Mention Primary Care Maddox Hlavaty: [place] Other Clinician: Referring Brelee Renk: Treating Addis Tuohy/Extender: [name] in Treatment: 0 Electronic Signature(s) Signed: 11/11/2020 5:08:08 PM By: Baruch Gouty RN, BSN Entered By: Baruch Gouty on 11/11/2020 09:59:19 -------------------------------------------------------------------------------- Multi Wound Chart Details Patient Name: Date of Service: [name], [name] [name] 11/11/2020 9:00 A M Medical Record Number: [id number] Patient Account Number: [id number] Date of Birth/Sex: Treating RN: March 02, 1928 (87 y.o. [name], [name] Primary Care Geddy Boydstun: [place] Other Clinician: Referring Arianie Couse: Treating Rossy Virag/Extender: [name] in Treatment: 0 Vital Signs Height(in): 69 Pulse(bpm): 31 Weight(lbs): 161 Blood Pressure(mmHg): 180/74 Body Mass Index(BMI): 24 Temperature(F): 97.7 Respiratory Rate(breaths/min): 18 Photos: [N/A:N/A] Head - frontal N/A N/A Wound Location: Surgical Injury N/A N/A Wounding Event: Soft Tissue Radionecrosis N/A N/A Primary Etiology: Open Surgical Wound N/A N/A Secondary Etiology: Cataracts, Chronic Obstructive N/A N/A Comorbid History: Pulmonary  Disease (COPD), Received Radiation 12/06/2018 N/A N/A Date Acquired: 0 N/A N/A Weeks of Treatment: Open N/A N/A Wound Status: 6.3x4.8x0.2 N/A N/A Measurements L x W x D (cm) 23.75 N/A N/A A (cm) : rea 4.75 N/A N/A Volume (cm) : 0.00% N/A N/A % Reduction in A rea: 0.00% N/A N/A % Reduction in Volume: Full Thickness With Exposed Support N/A N/A Classification: Structures Medium N/A N/A Exudate A mount: Serous N/A N/A Exudate Type: amber N/A N/A Exudate Color: Epibole N/A N/A Wound Margin: Small (1-33%) N/A N/A Granulation A mount: Pink N/A N/A Granulation Quality: None Present (0%) N/A N/A Necrotic A mount: Bone: Yes N/A N/A Exposed Structures: Fascia: No Fat  Layer (Subcutaneous Tissue): No Tendon: No Muscle: No Joint: No None N/A N/A Epithelialization: Chemical/Enzymatic/Mechanical - N/A N/A Debridement: Selective/Open Wound Pre-procedure Verification/Time Out 11:08 N/A N/A Taken: N/A N/A N/A Instrument: None N/A N/A Bleeding: Procedure was tolerated well N/A N/A Debridement Treatment Response: 6.3x4.8x0.2 N/A N/A Post Debridement Measurements L x W x D (cm) 4.75 N/A N/A Post Debridement Volume: (cm) Debridement N/A N/A Procedures Performed: Treatment Notes Wound #1 (Head - frontal) Cleanser Wound Cleanser Discharge Instruction: Cleanse the wound with wound cleanser prior to applying a clean dressing using gauze sponges, not tissue or cotton balls. Peri-Wound Care Topical Primary Dressing Fibracol Plus Dressing, 4x4.38 in (collagen) Discharge Instruction: Moisten collagen with saline or hydrogel Secondary Dressing Zetuvit Plus Silicone Border Dressing 4x4 (in/in) Discharge Instruction: Apply silicone border over primary dressing as directed. Secured With Compression Wrap Compression Stockings Environmental education officer) Signed: 11/20/2020 1:56:12 PM By: Kalman Shan DO Signed: 12/01/2020 10:01:01 AM By: Rhae Hammock  RN Entered By: Kalman Shan on 11/20/2020 13:21:42 -------------------------------------------------------------------------------- Multi-Disciplinary Care Plan Details Patient Name: Date of Service: [name], [name] [name] 11/11/2020 9:00 A M Medical Record Number: [id number] Patient Account Number: [id number] Date of Birth/Sex: Treating RN: 1928-02-01 (87 y.o. [name], [name] Primary Care Elijio Staples: [place] Other Clinician: Referring Adriel Kessen: Treating Williams Dietrick/Extender: [name] in Treatment: 0 Active Inactive Orientation to the Wound Care Program Nursing Diagnoses: Knowledge deficit related to the wound healing center program Goals: Patient/caregiver will verbalize understanding of the [place] Program Date Initiated: 11/11/2020 Target Resolution Date: 11/22/2020 Goal Status: Active Interventions: Provide education on orientation to the wound center Notes: Electronic Signature(s) Signed: 11/12/2020 4:26:30 PM By: Rhae Hammock RN Entered By: Rhae Hammock on 11/11/2020 11:08:59 -------------------------------------------------------------------------------- Pain Assessment Details Patient Name: Date of Service: [name], [name] [name] 11/11/2020 9:00 A M Medical Record Number: [id number] Patient Account Number: [id number] Date of Birth/Sex: Treating RN: [birth year] (87 y.o. Ernestene Mention Primary Care Nike Southwell: [place] Other Clinician: Referring Tandrea Kommer: Treating Alaycia Eardley/Extender: [name] in Treatment: 0 Active Problems Location of Pain Severity and Description of Pain Patient Has Paino No Site Locations Rate the pain. Current Pain Level: 0 Pain Management and Medication Current Pain Management: Electronic Signature(s) Signed: 11/11/2020 5:08:08 PM By: Baruch Gouty RN, BSN Entered By: Baruch Gouty on 11/11/2020  10:11:47 -------------------------------------------------------------------------------- Patient/Caregiver Education Details Patient Name: Date of Service: [name], [name] [name] 5/24/2022andnbsp9:00 A M Medical Record Number: [id number] Patient Account Number: [id number] Date of Birth/Gender: Treating RN: 1928-01-30 (87 y.o. Erie Noe Primary Care Physician: Rich Number Other Clinician: Referring Physician: Treating Physician/Extender: [name] in Treatment: 0 Education Assessment Education Provided To: Patient Education Topics Provided Welcome T [facility] [place]: o Methods: Explain/Verbal Responses: State content correctly Electronic Signature(s) Signed: 11/12/2020 4:26:30 PM By: Rhae Hammock RN Entered By: Rhae Hammock on 11/11/2020 17:32:32 -------------------------------------------------------------------------------- Wound Assessment Details Patient Name: Date of Service: [name], [name] [name] 11/11/2020 9:00 A M Medical Record Number: [id number] Patient Account Number: [id number] Date of Birth/Sex: Treating RN: 12-Mar-1928 (87 y.o. 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 - frontal Secondary Open Surgical Wound Wounding Event: Surgical Injury Etiology: Date Acquired: 12/06/2018 Wound Status: Open Weeks Of Treatment: 0 Comorbid Cataracts, Chronic Obstructive Pulmonary Disease (COPD), Clustered Wound: No History: Received Radiation Photos Wound  Measurements Length: (cm) 6.3 Width: (cm) 4.8 Depth: (cm) 0.2 Area: (cm) 23.75 Volume: (cm) 4.75 % Reduction in Area: 0% % Reduction in Volume: 0% Epithelialization: None Tunneling: No Undermining: No Wound  Description Classification: Full Thickness With Exposed Support Structures Wound Margin: Epibole Exudate Amount: Medium Exudate Type: Serous Exudate Color: amber Foul Odor After Cleansing: No Slough/Fibrino No Wound Bed Granulation Amount: Small (1-33%) Exposed Structure Granulation Quality: Pink Fascia Exposed: No Necrotic Amount: None Present (0%) Fat Layer (Subcutaneous Tissue) Exposed: No Tendon Exposed: No Muscle Exposed: No Joint Exposed: No Bone Exposed: Yes Electronic Signature(s) Signed: 11/13/2020 5:45:23 PM By: Baruch Gouty RN, BSN Signed: 12/08/2020 5:36:56 PM By: Levan Hurst RN, BSN Signed: 12/08/2020 5:36:56 PM By: Levan Hurst RN, BSN Previous Signature: 11/11/2020 4:53:55 PM Version By: Sandre Kitty Previous Signature: 11/11/2020 5:08:08 PM Version By: Baruch Gouty RN, BSN Entered By: Levan Hurst on 11/13/2020 15:33:15 -------------------------------------------------------------------------------- Vitals Details Patient Name: Date of Service: [name], [name] [name] 11/11/2020 9:00 A M Medical Record Number: [id number] Patient Account Number: [id number] Date of Birth/Sex: Treating RN: September 14, 1927 (87 y.o. Ernestene Mention Primary Care Hardy Harcum: Rich Number Other Clinician: Referring Maverick Patman: Treating Chanie Soucek/Extender: [name] in Treatment: 0 Vital Signs Time Taken: 09:40 Temperature (F): 97.7 Height (in): 69 Pulse (bpm): 71 Source: Stated Respiratory Rate (breaths/min): 18 Weight (lbs): 161 Blood Pressure (mmHg): 180/74 Source: Stated Reference Range: 80 - 120 mg / dl Body Mass Index (BMI): 23.8 Electronic Signature(s) Signed: 11/11/2020 5:08:08 PM By: Baruch Gouty RN, BSN Entered By: Baruch Gouty on 11/11/2020 09:46:25

## 2020-11-25 ENCOUNTER — Encounter (HOSPITAL_BASED_OUTPATIENT_CLINIC_OR_DEPARTMENT_OTHER): Payer: Medicare Other | Attending: Internal Medicine | Admitting: Internal Medicine

## 2020-11-25 ENCOUNTER — Other Ambulatory Visit (HOSPITAL_COMMUNITY): Payer: Self-pay | Admitting: Internal Medicine

## 2020-11-25 ENCOUNTER — Other Ambulatory Visit: Payer: Self-pay

## 2020-11-25 DIAGNOSIS — J9611 Chronic respiratory failure with hypoxia: Secondary | ICD-10-CM | POA: Insufficient documentation

## 2020-11-25 DIAGNOSIS — X58XXXA Exposure to other specified factors, initial encounter: Secondary | ICD-10-CM | POA: Insufficient documentation

## 2020-11-25 DIAGNOSIS — S0180XA Unspecified open wound of other part of head, initial encounter: Secondary | ICD-10-CM | POA: Insufficient documentation

## 2020-11-25 DIAGNOSIS — Z85828 Personal history of other malignant neoplasm of skin: Secondary | ICD-10-CM | POA: Diagnosis not present

## 2020-11-25 DIAGNOSIS — Z87891 Personal history of nicotine dependence: Secondary | ICD-10-CM | POA: Insufficient documentation

## 2020-11-25 DIAGNOSIS — J449 Chronic obstructive pulmonary disease, unspecified: Secondary | ICD-10-CM

## 2020-11-26 ENCOUNTER — Other Ambulatory Visit (HOSPITAL_COMMUNITY): Payer: Self-pay | Admitting: Internal Medicine

## 2020-11-26 DIAGNOSIS — R0600 Dyspnea, unspecified: Secondary | ICD-10-CM

## 2020-11-26 NOTE — Progress Notes (Signed)
LORY, GALAN ([id number]) Visit Report for 11/25/2020 Arrival Information Details Patient Name: Date of Service: [name] [name] 11/25/2020 10:00 A M Medical Record Number: [id number] Patient Account Number: [id number] Date of Birth/Sex: Treating RN: 1927/08/09 (87 y.o. [name], [name] Primary Care Narmeen Kerper: Rich Number Other Clinician: Referring Margel Joens: Treating Dahlia Nifong/Extender: Cyril Mourning in Treatment: 2 Visit Information History Since Last Visit Added or deleted any medications: No Patient Arrived: Ambulatory Any new allergies or adverse reactions: No Arrival Time: 10:39 Had a fall or experienced change in No Accompanied By: wife activities of daily living that may affect Transfer Assistance: None risk of falls: Patient Identification Verified: Yes Signs or symptoms of abuse/neglect since last visito No Secondary Verification Process Completed: Yes Hospitalized since last visit: No Patient Requires Transmission-Based Precautions: No Implantable device outside of the clinic excluding No Patient Has Alerts: No cellular tissue based products placed in the center since last visit: Has Dressing in Place as Prescribed: Yes Pain Present Now: No Electronic Signature(s) Signed: 11/25/2020 2:13:29 PM By: Sandre Kitty Entered By: Sandre Kitty on 11/25/2020 10:40:18 -------------------------------------------------------------------------------- Clinic Level of Care Assessment Details Patient Name: Date of Service: [name] [name] 11/25/2020 10:00 A M Medical Record Number: [id number] Patient Account Number: [id number] Date of Birth/Sex: Treating RN: [birth year] (87 y.o. [name], [name] Primary Care Jeury Mcnab: Rich Number Other Clinician: Referring Ayda Tancredi: Treating Kaiyla Stahly/Extender: Cyril Mourning in Treatment: 2 Clinic Level of Care Assessment Items TOOL 4 Quantity Score X- 1 0 Use when only an  EandM is performed on FOLLOW-UP visit ASSESSMENTS - Nursing Assessment / Reassessment X- 1 10 Reassessment of Co-morbidities (includes updates in patient status) X- 1 5 Reassessment of Adherence to Treatment Plan ASSESSMENTS - Wound and Skin A ssessment / Reassessment X - Simple Wound Assessment / Reassessment - one wound 1 5 []  - 0 Complex Wound Assessment / Reassessment - multiple wounds X- 1 10 Dermatologic / Skin Assessment (not related to wound area) ASSESSMENTS - Focused Assessment []  - 0 Circumferential Edema Measurements - multi extremities X- 1 10 Nutritional Assessment / Counseling / Intervention []  - 0 Lower Extremity Assessment (monofilament, tuning fork, pulses) []  - 0 Peripheral Arterial Disease Assessment (using hand held doppler) ASSESSMENTS - Ostomy and/or Continence Assessment and Care []  - 0 Incontinence Assessment and Management []  - 0 Ostomy Care Assessment and Management (repouching, etc.) PROCESS - Coordination of Care X - Simple Patient / Family Education for ongoing care 1 15 []  - 0 Complex (extensive) Patient / Family Education for ongoing care X- 1 10 Staff obtains Consents, Records, T Results / Process Orders est []  - 0 Staff telephones HHA, Nursing Homes / Clarify orders / etc []  - 0 Routine Transfer to another Facility (non-emergent condition) []  - 0 Routine Hospital Admission (non-emergent condition) []  - 0 New Admissions / Biomedical engineer / Ordering NPWT Apligraf, etc. , []  - 0 Emergency Hospital Admission (emergent condition) X- 1 10 Simple Discharge Coordination []  - 0 Complex (extensive) Discharge Coordination PROCESS - Special Needs []  - 0 Pediatric / Minor Patient Management []  - 0 Isolation Patient Management []  - 0 Hearing / Language / Visual special needs []  - 0 Assessment of Community assistance (transportation, D/C planning, etc.) []  - 0 Additional assistance / Altered mentation []  - 0 Support Surface(s)  Assessment (bed, cushion, seat, etc.) INTERVENTIONS - Wound Cleansing / Measurement X - Simple Wound Cleansing - one wound 1 5 []  - 0 Complex Wound Cleansing -  multiple wounds X- 1 5 Wound Imaging (photographs - any number of wounds) []  - 0 Wound Tracing (instead of photographs) X- 1 5 Simple Wound Measurement - one wound []  - 0 Complex Wound Measurement - multiple wounds INTERVENTIONS - Wound Dressings X - Small Wound Dressing one or multiple wounds 1 10 []  - 0 Medium Wound Dressing one or multiple wounds []  - 0 Large Wound Dressing one or multiple wounds []  - 0 Application of Medications - topical []  - 0 Application of Medications - injection INTERVENTIONS - Miscellaneous []  - 0 External ear exam []  - 0 Specimen Collection (cultures, biopsies, blood, body fluids, etc.) []  - 0 Specimen(s) / Culture(s) sent or taken to Lab for analysis []  - 0 Patient Transfer (multiple staff / Civil Service fast streamer / Similar devices) []  - 0 Simple Staple / Suture removal (25 or less) []  - 0 Complex Staple / Suture removal (26 or more) []  - 0 Hypo / Hyperglycemic Management (close monitor of Blood Glucose) []  - 0 Ankle / Brachial Index (ABI) - do not check if billed separately X- 1 5 Vital Signs Has the patient been seen at the hospital within the last three years: Yes Total Score: 105 Level Of Care: New/Established - Level 3 Electronic Signature(s) Signed: 11/25/2020 5:39:03 PM By: Deon Pilling Entered By: Deon Pilling on 11/25/2020 11:35:20 -------------------------------------------------------------------------------- Encounter Discharge Information Details Patient Name: Date of Service: [name], [name] [name] 11/25/2020 10:00 A M Medical Record Number: [id number] Patient Account Number: [id number] Date of Birth/Sex: Treating RN: April 24, 1928 (87 y.o. [name] Primary Care Garris Melhorn: Rich Number Other Clinician: Referring Jerron Niblack: Treating Janthony Holleman/Extender: Cyril Mourning in Treatment: 2 Encounter Discharge Information Items Discharge Condition: Stable Ambulatory Status: Ambulatory Discharge Destination: Home Transportation: Private Auto Accompanied By: wife Schedule Follow-up Appointment: Yes Clinical Summary of Care: Electronic Signature(s) Signed: 11/25/2020 5:39:03 PM By: Deon Pilling Entered By: Deon Pilling on 11/25/2020 11:37:12 -------------------------------------------------------------------------------- Lower Extremity Assessment Details Patient Name: Date of Service: [name] [name] 11/25/2020 10:00 A M Medical Record Number: [id number] Patient Account Number: [id number] Date of Birth/Sex: Treating RN: 08/25/1927 (87 y.o. [name] Primary Care Rhya Shan: Rich Number Other Clinician: Referring Samaira Holzworth: Treating Taquan Bralley/Extender: [name] in Treatment: 2 Electronic Signature(s) Signed: 11/25/2020 5:39:03 PM By: Deon Pilling Signed: 11/26/2020 5:57:05 PM By: Levan Hurst RN, BSN Entered By: Levan Hurst on 11/25/2020 13:44:08 -------------------------------------------------------------------------------- Multi Wound Chart Details Patient Name: Date of Service: [name], [name] [name] 11/25/2020 10:00 A M Medical Record Number: [id number] Patient Account Number: [id number] Date of Birth/Sex: Treating RN: Jan 20, 1928 (87 y.o. [name], [name] Primary Care Christy Friede: Rich Number Other Clinician: Referring Patte Winkel: Treating Shaunika Italiano/Extender: Cyril Mourning in Treatment: 2 Vital Signs Height(in): 20 Pulse(bpm): 51 Weight(lbs): 161 Blood Pressure(mmHg): 193/70 Body Mass Index(BMI): 24 Temperature(F): 97.6 Respiratory Rate(breaths/min): 18 Photos: [1:Head - frontal] [N/A:No Photos N/A N/A] Wound Location: [1:Surgical Injury] [N/A:N/A] Wounding Event: [1:Soft Tissue Radionecrosis] [N/A:N/A] Primary Etiology: [1:Open Surgical  Wound] [N/A:N/A] Secondary Etiology: [1:Cataracts, Chronic Obstructive] [N/A:N/A] Comorbid History: [1:Pulmonary Disease (COPD), Received Radiation 12/06/2018] [N/A:N/A] Date Acquired: [1:2] [N/A:N/A] Weeks of Treatment: [1:Open] [N/A:N/A] Wound Status: [1:6.3x4.5x0.2] [N/A:N/A] Measurements L x W x D (cm) [1:22.266] [N/A:N/A] A (cm) : rea [1:4.453] [N/A:N/A] Volume (cm) : [1:6.20%] [N/A:N/A] % Reduction in Area: [1:6.30%] [N/A:N/A] % Reduction in Volume: [1:Full Thickness With Exposed Support N/A] Classification: [1:Structures Medium] [N/A:N/A] Exudate Amount: [1:Serous] [N/A:N/A] Exudate Type: [1:amber] [N/A:N/A] Exudate Color: [1:Epibole] [N/A:N/A] Wound Margin: [1:Small (1-33%)] [  N/A:N/A] Granulation Amount: [1:Pink] [N/A:N/A] Granulation Quality: [1:Small (1-33%)] [N/A:N/A] Necrotic Amount: [1:Bone: Yes] [N/A:N/A] Exposed Structures: [1:Fascia: No Fat Layer (Subcutaneous Tissue): No Tendon: No Muscle: No Joint: No None] [N/A:N/A] Treatment Notes Electronic Signature(s) Signed: 11/25/2020 11:40:50 AM By: Kalman Shan DO Signed: 11/25/2020 5:27:47 PM By: Rhae Hammock RN Entered By: Kalman Shan on 11/25/2020 11:27:43 -------------------------------------------------------------------------------- Multi-Disciplinary Care Plan Details Patient Name: Date of Service: [name], [name] [name] 11/25/2020 10:00 A M Medical Record Number: [id number] Patient Account Number: [id number] Date of Birth/Sex: Treating RN: 10/27/1927 (87 y.o. [name], [name] Primary Care Anyae Griffith: Rich Number Other Clinician: Referring Teven Mittman: Treating Tarissa Kerin/Extender: Cyril Mourning in Treatment: 2 Active Inactive HBO Nursing Diagnoses: Potential for barotraumas to ears, sinuses, teeth, and lungs or cerebral gas embolism related to changes in atmospheric pressure inside hyperbaric oxygen chamber Potential for oxygen toxicity seizures related to delivery of 100%  oxygen at an increased atmospheric pressure Potential for pulmonary oxygen toxicity related to delivery of 100% oxygen at an increased atmospheric pressure Goals: Barotrauma will be prevented during HBO2 Date Initiated: 11/25/2020 Target Resolution Date: 12/26/2020 Goal Status: Active Patient will tolerate the hyperbaric oxygen therapy treatment Date Initiated: 11/25/2020 Target Resolution Date: 12/26/2020 Goal Status: Active Patient/caregiver will verbalize understanding of HBO goals, rationale, procedures and potential hazards Date Initiated: 11/25/2020 Target Resolution Date: 12/26/2020 Goal Status: Active Interventions: Administer decongestants, per physician orders, prior to HBO2 Assess and provide for patients comfort related to the hyperbaric environment and equalization of middle ear Assess patient for any history of confinement anxiety Notes: Electronic Signature(s) Signed: 11/25/2020 5:39:03 PM By: Deon Pilling Entered By: Deon Pilling on 11/25/2020 11:00:28 -------------------------------------------------------------------------------- Pain Assessment Details Patient Name: Date of Service: [name] [name] 11/25/2020 10:00 A M Medical Record Number: [id number] Patient Account Number: [id number] Date of Birth/Sex: Treating RN: Sep 23, 1927 (87 y.o. [name], [name] Primary Care Shea Kapur: Rich Number Other Clinician: Referring Carlisa Eble: Treating Jaymason Ledesma/Extender: [name] in Treatment: 2 Active Problems Location of Pain Severity and Description of Pain Patient Has Paino No Site Locations Pain Management and Medication Current Pain Management: Electronic Signature(s) Signed: 11/25/2020 5:27:47 PM By: Rhae Hammock RN Signed: 11/26/2020 5:57:05 PM By: Levan Hurst RN, BSN Entered By: Levan Hurst on 11/25/2020 13:44:02 -------------------------------------------------------------------------------- Patient/Caregiver Education  Details Patient Name: Date of Service: [name], [name] [name] 6/7/2022andnbsp10:00 [place] Record Number: [id number] Patient Account Number: [id number] Date of Birth/Gender: Treating RN: 1927-08-10 (87 y.o. [name] Primary Care Physician: Rich Number Other Clinician: Referring Physician: Treating Physician/Extender: Cyril Mourning in Treatment: 2 Education Assessment Education Provided To: Patient Education Topics Provided Wound/Skin Impairment: Handouts: Skin Care Do's and Dont's Methods: Explain/Verbal Responses: Reinforcements needed Electronic Signature(s) Signed: 11/25/2020 5:39:03 PM By: Deon Pilling Entered By: Deon Pilling on 11/25/2020 11:00:52 -------------------------------------------------------------------------------- Wound Assessment Details Patient Name: Date of Service: [name] [name] 11/25/2020 10:00 A M Medical Record Number: [id number] Patient Account Number: [id number] Date of Birth/Sex: Treating RN: 02/20/1928 (87 y.o. 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 Secondary Open Surgical Wound Wounding Event: Surgical Injury Etiology: Date Acquired: 12/06/2018 Wound Status: Open Weeks Of Treatment: 2 Comorbid Cataracts, Chronic Obstructive Pulmonary Disease (COPD), Clustered Wound: No History: Received Radiation Photos Wound Measurements Length: (cm) 6.3 Width: (cm) 4.5 Depth: (cm) 0.2 Area: (cm)  22.266 Volume: (cm) 4.453 % Reduction in Area: 6.2% % Reduction in Volume: 6.3% Epithelialization: None Tunneling: No Undermining: No Wound Description Classification: Full Thickness With Exposed Support Structures Wound Margin: Epibole Exudate Amount:  Medium Exudate Type: Serous Exudate Color: amber Foul Odor After Cleansing: No Slough/Fibrino Yes Wound Bed Granulation Amount: Small (1-33%) Exposed Structure Granulation Quality: Pink Fascia Exposed: No Necrotic Amount: Small (1-33%) Fat Layer (Subcutaneous Tissue) Exposed: No Necrotic Quality: Adherent Slough Tendon Exposed: No Muscle Exposed: No Joint Exposed: No Bone Exposed: Yes Treatment Notes Wound #1 (Head - frontal) Cleanser Wound Cleanser Discharge Instruction: Cleanse the wound with wound cleanser prior to applying a clean dressing using gauze sponges, not tissue or cotton balls. Peri-Wound Care Topical Primary Dressing Fibracol Plus Dressing, 4x4.38 in (collagen) Discharge Instruction: Moisten collagen with saline or hydrogel Secondary Dressing Zetuvit Plus Silicone Border Dressing 4x4 (in/in) Discharge Instruction: Apply silicone border over primary dressing as directed. Secured With Compression Wrap Compression Stockings Environmental education officer) Signed: 11/25/2020 5:03:12 PM By: Sandre Kitty Signed: 11/25/2020 5:27:47 PM By: Rhae Hammock RN Entered By: Sandre Kitty on 11/25/2020 16:24:48 -------------------------------------------------------------------------------- Vitals Details Patient Name: Date of Service: [name], [name] [name] 11/25/2020 10:00 A M Medical Record Number: [id number] Patient Account Number: [id number] Date of Birth/Sex: Treating RN: 13-Mar-1928 (87 y.o. [name], [name] Primary Care Gunnar Hereford: Rich Number Other Clinician: Referring Minor Iden: Treating Rache Klimaszewski/Extender: [name] in Treatment: 2 Vital Signs Time Taken: 10:40 Temperature (F): 97.6 Height (in): 69 Pulse (bpm): 72 Weight (lbs): 161 Respiratory Rate (breaths/min): 18 Body Mass Index (BMI): 23.8 Blood Pressure (mmHg): 193/70 Reference Range: 80 - 120 mg / dl Electronic Signature(s) Signed: 11/26/2020 5:57:05 PM  By: Levan Hurst RN, BSN Entered By: Levan Hurst on 11/25/2020 13:43:57

## 2020-11-26 NOTE — Progress Notes (Addendum)
MATE, ALEGRIA ([id number]) Visit Report for 11/25/2020 Chief Complaint Document Details Patient Name: Date of Service: [name] [name] 11/25/2020 10:00 A M Medical Record Number: [id number] Patient Account Number: [id number] Date of Birth/Sex: Treating RN: 1928/02/24 (87 y.o. [name], [name] Primary Care Provider: Rich Number Other Clinician: Referring Provider: Treating Provider/Extender: Cyril Mourning in Treatment: 2 Information Obtained from: Patient Chief Complaint Open wound With calvarium exposed Electronic Signature(s) Signed: 11/25/2020 11:40:50 AM By: Kalman Shan DO Signed: 11/25/2020 5:45:41 PM By: Linton Ham MD Entered By: Kalman Shan on 11/25/2020 11:27:50 -------------------------------------------------------------------------------- HPI Details Patient Name: Date of Service: [name], [name] [name] 11/25/2020 10:00 A M Medical Record Number: [id number] Patient Account Number: [id number] Date of Birth/Sex: Treating RN: 10/08/1927 (87 y.o. 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 The presents today for evaluation of hyperbaric oxygen for exposed calvarium from a previously removed squamous cell carcinoma lesion. He is currently using hydrogel to the area and keeping it covered. He takes doxycycline for chronic suppression of chronic osteomyelitis. He currently denies any signs of infection. He had Mohs surgery to remove the squamous cell carcinoma in 2019. He subsequently had radiation treatment from 02/02/2018 -  03/07/2018 to the left forehead with 5 5.00GY in 22 fractions using 6E electrons with a clinical set up technique. He had a nonhealing wound with exposed calvarium and And a full- thickness skin graft was attempted on 12/2018. on 07/13/2019 he was evaluated by Infectious disease for evaluation of purulent drainage. 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 He was given 6 weeks of IV antibiotics and is currently on lifelong doxycycline for suppression. 6/7; patient presents for 2-week follow-up. He has been using collagen with hydrogel to the wound bed. He reports no issues with this. He denies signs of acute infection. Electronic Signature(s) Signed: 11/25/2020 11:40:50 AM By: Kalman Shan DO Signed: 11/25/2020 5:45:41 PM By: Linton Ham MD Entered By: Kalman Shan on 11/25/2020 11:28:18 -------------------------------------------------------------------------------- Physical Exam Details Patient Name: Date of Service: [name], [name] [name] 11/25/2020 10:00 A M Medical Record Number: [id number] Patient Account Number: [id number] Date of Birth/Sex: Treating RN: [birth year] (87 y.o. Erie Noe Primary Care Provider: Rich Number Other Clinician: Referring Provider: Treating Provider/Extender: Cyril Mourning in Treatment: 2 Constitutional respirations regular, non-labored and within target range for patient.Marland Kitchen Psychiatric pleasant and cooperative. Notes Exposed calvarium with rolled wound edges. No obvious signs of infection. Electronic Signature(s) Signed: 11/25/2020 11:40:50 AM By: Kalman Shan DO Signed: 11/25/2020 5:45:41 PM By: Linton Ham MD Entered By: Kalman Shan on 11/25/2020 11:33:06 -------------------------------------------------------------------------------- Physician Orders Details Patient Name: Date of Service: [name], [name] [name] 11/25/2020 10:00 A M Medical Record Number:  [id number] Patient Account Number: [id number] Date of Birth/Sex: Treating RN: 07/07/1927 (87 y.o. [name], [name] Primary Care Provider: Rich Number Other Clinician: Referring Provider: Treating Provider/Extender: [name] in Treatment: 2 Verbal / Phone Orders: No Diagnosis Coding ICD-10 Coding Code Description S01.00XA Unspecified open wound of scalp, initial encounter J44.9 Chronic obstructive pulmonary disease, unspecified M86.68 Other chronic osteomyelitis, other site Z85.828 Personal  history of other malignant neoplasm of skin Follow-up Appointments ppointment in 2 weeks. - Dr. Heber [place] Return A Bathing/ Shower/ Hygiene May shower with protection but do not get wound dressing(s) wet. Hyperbaric Oxygen Therapy Wound #1 Head - frontal Evaluate for HBO Therapy Indication: - Chronic refractory osteomyelitis to frontal head wound. If appropriate for treatment, begin HBOT per protocol: 2.0 ATA for 90 Minutes with 2 Five (5) Minute A Breaks ir Total Number of Treatments: - 40 One treatments per day (delivered Monday through Friday unless otherwise specified in Special Instructions below): A frin (Oxymetazoline HCL) 0.05% nasal spray - 1 spray in both nostrils daily as needed prior to HBO treatment for difficulty clearing ears Wound Treatment Wound #1 - Head - frontal Cleanser: Wound Cleanser (Generic) 1 x Per Day/15 Days Discharge Instructions: Cleanse the wound with wound cleanser prior to applying a clean dressing using gauze sponges, not tissue or cotton balls. Prim Dressing: Fibracol Plus Dressing, 4x4.38 in (collagen) (Generic) 1 x Per Day/15 Days ary Discharge Instructions: Moisten collagen with saline or hydrogel Secondary Dressing: Zetuvit Plus Silicone Border Dressing 4x4 (in/in) (Generic) 1 x Per Day/15 Days Discharge Instructions: Apply silicone border over primary dressing as directed. Radiology X-ray, Chest - chest x-ray related  to COPD and hyberbaric protocol ICD 10 code J44.9, CPT code [employer] echo 2D with doppler - Echo 2D with Doppler related to COPD and hyberbaric protocol ICD 10 code R06.00, CPT code Electronic Signature(s) Signed: 11/28/2020 3:47:49 PM By: Kalman Shan DO Previous Signature: 11/25/2020 5:42:30 PM Version By: Deon Pilling Previous Signature: 11/26/2020 3:25:37 PM Version By: Kalman Shan DO Previous Signature: 11/25/2020 2:41:00 PM Version By: Kalman Shan DO Previous Signature: 11/25/2020 11:40:50 AM Version By: Kalman Shan DO Entered By: Kalman Shan on 11/28/2020 15:47:27 Prescription 11/25/2020 -------------------------------------------------------------------------------- [name]. Kalman Shan DO Patient Name: Provider: 1928-04-12 [id number] Date of Birth: NPI#: Jerilynn Mages [id number] Sex: DEA #: [phone] Phone #: License #: [place] Patient Address: [facility] [address] [place], [place] [address] [place], [place] [address] [phone] Allergies Sulfa (Sulfonamide Antibiotics) Provider's Orders X-ray, Chest - chest x-ray related to COPD and hyberbaric protocol ICD 10 code J44.9, CPT code Hand Signature: Date(s): Prescription 11/25/2020 Matilde Bash E. Kalman Shan DO Patient Name: Provider: 1928/05/17 [id number] Date of Birth: NPI#: Jerilynn Mages [id number] Sex: DEA #: [phone] Phone #: License #: [place] Patient Address: [facility] [address] [place], [place] [address] [place], [place] [address] [phone] Allergies Sulfa (Sulfonamide Antibiotics) Provider's Orders echo 2D with doppler - Echo 2D with Doppler related to COPD and hyberbaric protocol ICD 10 code R06.00, CPT code Hand Signature: Date(s): Electronic Signature(s) Signed: 11/28/2020 3:47:49 PM By: Kalman Shan DO Previous  Signature: 11/25/2020 5:42:30 PM Version By: Deon Pilling Previous Signature: 11/26/2020 3:25:37 PM Version By: Kalman Shan DO Previous Signature: 11/25/2020 2:41:00 PM Version By: Kalman Shan DO Previous Signature: 11/25/2020 11:40:50 AM Version By: Kalman Shan DO Entered By: Kalman Shan on 11/28/2020 15:47:27 -------------------------------------------------------------------------------- Problem List Details Patient Name: Date of Service: [name], [name] [name] 11/25/2020 10:00 A M Medical Record Number: [id number] Patient Account Number: [id number] Date of Birth/Sex: Treating RN: July 08, 1927 (87 y.o. Hessie Diener Primary Care Provider: Rich Number Other Clinician: Referring Provider: Treating Provider/Extender: Cyril Mourning in Treatment: 2 Active Problems ICD-10 Encounter Code Description Active Date MDM Diagnosis S01.00XD Unspecified open wound of scalp,  subsequent encounter 11/20/2020 No Yes J44.9 Chronic obstructive pulmonary disease, unspecified 11/20/2020 No Yes M86.68 Other chronic osteomyelitis, other site 11/20/2020 No Yes Z85.828 Personal history of other malignant neoplasm of skin 11/20/2020 No Yes Inactive Problems Resolved Problems Electronic Signature(s) Signed: 11/25/2020 11:40:50 AM By: Kalman Shan DO Signed: 11/25/2020 5:45:41 PM By: Linton Ham MD Entered By: Kalman Shan on 11/25/2020 11:38:46 -------------------------------------------------------------------------------- Progress Note Details Patient Name: Date of Service: [name], [name] [name] 11/25/2020 10:00 A M Medical Record Number: [id number] Patient Account Number: [id number] Date of Birth/Sex: Treating RN: [birth year] (87 y.o. [name], [name] Primary Care Provider: Rich Number Other Clinician: Referring Provider: Treating Provider/Extender: Cyril Mourning in Treatment: 2 Subjective Chief Complaint Information obtained  from Patient Open wound With calvarium exposed History of Present Illness (HPI) 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 The presents today for evaluation of hyperbaric oxygen for exposed calvarium from a previously removed squamous cell carcinoma lesion. He is currently using hydrogel to the area and keeping it covered. He takes doxycycline for chronic suppression of chronic osteomyelitis. He currently denies any signs of infection. He had Mohs surgery to remove the squamous cell carcinoma in 2019. He subsequently had radiation treatment from 02/02/2018 - 03/07/2018 to the left forehead with 5 5.00GY in 22 fractions using 6E electrons with a clinical set up technique. He had a nonhealing wound with exposed calvarium and And a full- thickness skin graft was attempted on 12/2018. on 07/13/2019 he was evaluated by Infectious disease for evaluation of purulent drainage. 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 He was given 6 weeks of IV antibiotics and is currently on lifelong doxycycline for suppression. 6/7; patient presents for 2-week follow-up. He has been using collagen with hydrogel to the wound bed. He reports no issues with this. He denies signs of acute infection. Patient History Information obtained from Patient. Family History Cancer - Father, Heart Disease - Mother, No family history of Diabetes, Hereditary Spherocytosis, Hypertension, Kidney Disease, Lung Disease, Seizures, Stroke, Thyroid Problems, Tuberculosis. Social History Former smoker, Marital Status - Married, Alcohol Use - Rarely, Drug Use - No History, Caffeine Use - Moderate. Medical History Eyes Patient has history of Cataracts - bil removed Denies history of Glaucoma, Optic  Neuritis Ear/Nose/Mouth/Throat Denies history of Chronic sinus problems/congestion, Middle ear problems Respiratory Patient has history of Chronic Obstructive Pulmonary Disease (COPD) Denies history of Aspiration, Asthma, Pneumothorax, Sleep Apnea, Tuberculosis Gastrointestinal Denies history of Cirrhosis , Colitis, Crohnoos, Hepatitis A, Hepatitis B, Hepatitis C Endocrine Denies history of Type I Diabetes, Type II Diabetes Integumentary (Skin) Denies history of History of Burn Musculoskeletal Denies history of Gout, Rheumatoid Arthritis, Osteoarthritis, Osteomyelitis Oncologic Patient has history of Received Radiation - 20 treatments Denies history of Received Chemotherapy Psychiatric Denies history of Anorexia/bulimia, Confinement Anxiety Hospitalization/Surgery History - Moh's procedure to scalp. - tracheotomy as child. - bilateral cataracts removed. - left hip replacement. Medical A Surgical History Notes nd Endocrine hypothyroidism Oncologic sqamous cell carcinoma of the scalp Objective Constitutional respirations regular, non-labored and within target range for patient.. Vitals Time Taken: 10:40 AM, Height: 69 in, Weight: 161 lbs, BMI: 23.8, Temperature: 97.6 F, Pulse: 72 bpm, Respiratory Rate: 18 breaths/min, Blood Pressure: 193/70 mmHg. Psychiatric pleasant and cooperative. General Notes: Exposed calvarium with rolled wound  edges. No obvious signs of infection. Integumentary (Hair, Skin) Wound #1 status is Open. Original cause of wound was Surgical Injury. The date acquired was: 12/06/2018. The wound has been in treatment 2 weeks. The wound is located on the Head - frontal. The wound measures 6.3cm length x 4.5cm width x 0.2cm depth; 22.266cm^2 area and 4.453cm^3 volume. There is bone exposed. There is no tunneling or undermining noted. There is a medium amount of serous drainage noted. The wound margin is epibole. There is small (1- 33%) pink granulation within the  wound bed. There is a small (1-33%) amount of necrotic tissue within the wound bed including Adherent Slough. Assessment Active Problems ICD-10 Unspecified open wound of scalp, subsequent encounter Chronic obstructive pulmonary disease, unspecified Other chronic osteomyelitis, other site Personal history of other malignant neoplasm of skin Patient presents for follow-up. Wound is overall stable With no obvious signs of acute infection. We will continue with hydrogel and collagen. We rediscussed hyperbaric oxygen treatment as an option. I did reach out to Dr. Halford Chessman, his pulmonologist to discuss HBO in the setting of the patients COPD. He said since the patient has no prior history of pneumothorax or bullous lung disease the risk for barotrauma from HBO should be low. Patient would like to proceed and obtain a chest x-ray and echo to see if HBO would be approved for him. Plan Follow-up Appointments: Return Appointment in 2 weeks. - Dr. Heber [place] Bathing/ Shower/ Hygiene: May shower with protection but do not get wound dressing(s) wet. 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 If appropriate for treatment, begin HBOT per protocol: 2.5 ATA for 90 Minutes with 2 Five (5) Minute Air Breaks T Number of Treatments: - 40 otal One treatments per day (delivered Monday through Friday unless otherwise specified in Special Instructions below): Afrin (Oxymetazoline HCL) 0.05% nasal spray - 1 spray in both nostrils daily as needed prior to HBO treatment for difficulty clearing ears Radiology ordered were: X-ray, Chest - chest x-ray related to COPD and hyberbaric protocol ICD 10 code J44.9, CPT code ordered were: echo 2D with doppler - Echo 2D with Doppler related to COPD and hyberbaric protocol ICD 10 code R06.00, CPT code WOUND #1: - Head - frontal Wound Laterality: Cleanser: Wound Cleanser  (Generic) 1 x Per Day/15 Days Discharge Instructions: Cleanse the wound with wound cleanser prior to applying a clean dressing using gauze sponges, not tissue or cotton balls. Prim Dressing: Fibracol Plus Dressing, 4x4.38 in (collagen) (Generic) 1 x Per Day/15 Days ary Discharge Instructions: Moisten collagen with saline or hydrogel Secondary Dressing: Zetuvit Plus Silicone Border Dressing 4x4 (in/in) (Generic) 1 x Per Day/15 Days Discharge Instructions: Apply silicone border over primary dressing as directed. 1. Collagen and hydrogel daily 2. Obtain chest x-ray and echocardiogram 3. Send paperwork for approval of HBO Electronic Signature(s) Signed: 11/25/2020 5:42:30 PM By: Deon Pilling Signed: 11/25/2020 5:45:41 PM By: Linton Ham MD Previous Signature: 11/25/2020 11:40:50 AM Version By: Kalman Shan DO Entered By: Deon Pilling on 11/25/2020 17:41:57 -------------------------------------------------------------------------------- HxROS Details Patient Name: Date of Service: [name], [name] [name] 11/25/2020 10:00 A M Medical Record Number: [id number] Patient Account Number: [id number] Date of Birth/Sex: Treating RN: [birth year] (87 y.o. Erie Noe Primary Care Provider: Rich Number Other Clinician: Referring Provider: Treating Provider/Extender: Cyril Mourning in Treatment: 2 Information Obtained From Patient Eyes Medical History: Positive for: Cataracts - bil removed Negative for: Glaucoma;  Optic Neuritis Ear/Nose/Mouth/Throat Medical History: Negative for: Chronic sinus problems/congestion; Middle ear problems Respiratory Medical History: Positive for: Chronic Obstructive Pulmonary Disease (COPD) Negative for: Aspiration; Asthma; Pneumothorax; Sleep Apnea; Tuberculosis Gastrointestinal Medical History: Negative for: Cirrhosis ; Colitis; Crohns; Hepatitis A; Hepatitis B; Hepatitis C Endocrine Medical History: Negative for: Type I  Diabetes; Type II Diabetes Past Medical History Notes: hypothyroidism Integumentary (Skin) Medical History: Negative for: History of Burn Musculoskeletal Medical History: Negative for: Gout; Rheumatoid Arthritis; Osteoarthritis; Osteomyelitis Oncologic Medical History: Positive for: Received Radiation - 20 treatments Negative for: Received Chemotherapy Past Medical History Notes: sqamous cell carcinoma of the scalp Psychiatric Medical History: Negative for: Anorexia/bulimia; Confinement Anxiety HBO Extended History Items Eyes: Cataracts Immunizations Pneumococcal Vaccine: Received Pneumococcal Vaccination: No Implantable Devices No devices added Hospitalization / Surgery History Type of Hospitalization/Surgery Moh's procedure to scalp tracheotomy as child bilateral cataracts removed left hip replacement Family and Social History Cancer: Yes - Father; Diabetes: No; Heart Disease: Yes - Mother; Hereditary Spherocytosis: No; Hypertension: No; Kidney Disease: No; Lung Disease: No; Seizures: No; Stroke: No; Thyroid Problems: No; Tuberculosis: No; Former smoker; Marital Status - Married; Alcohol Use: Rarely; Drug Use: No History; Caffeine Use: Moderate; Financial Concerns: No; Food, Clothing or Shelter Needs: No; Support System Lacking: No; Transportation Concerns: No Electronic Signature(s) Signed: 11/25/2020 11:40:50 AM By: Kalman Shan DO Signed: 11/25/2020 5:27:47 PM By: Rhae Hammock RN Signed: 11/25/2020 5:45:41 PM By: Linton Ham MD Entered By: Kalman Shan on 11/25/2020 11:28:24 -------------------------------------------------------------------------------- SuperBill Details Patient Name: Date of Service: [name], [name] [name] 11/25/2020 Medical Record Number: [id number] Patient Account Number: [id number] Date of Birth/Sex: Treating RN: 06-30-1927 (87 y.o. Hessie Diener Primary Care Provider: Rich Number Other Clinician: Referring Provider: Treating  Provider/Extender: Cyril Mourning in Treatment: 2 Diagnosis Coding ICD-10 Codes Code Description S01.00XD Unspecified open wound of scalp, subsequent encounter J44.9 Chronic obstructive pulmonary disease, unspecified M86.68 Other chronic osteomyelitis, other site Z85.828 Personal history of other malignant neoplasm of skin Facility Procedures CPT4 Code: [id number] Description: 67341 - WOUND CARE VISIT-LEV 3 EST PT Modifier: Quantity: 1 Electronic Signature(s) Signed: 11/25/2020 11:40:50 AM By: Kalman Shan DO Signed: 11/25/2020 5:45:41 PM By: Linton Ham MD Entered By: Kalman Shan on 11/25/2020 11:39:31

## 2020-11-27 ENCOUNTER — Other Ambulatory Visit (HOSPITAL_BASED_OUTPATIENT_CLINIC_OR_DEPARTMENT_OTHER): Payer: Self-pay | Admitting: Internal Medicine

## 2020-11-27 ENCOUNTER — Other Ambulatory Visit (HOSPITAL_BASED_OUTPATIENT_CLINIC_OR_DEPARTMENT_OTHER): Payer: Self-pay

## 2020-11-27 ENCOUNTER — Ambulatory Visit: Payer: Medicare Other | Attending: Internal Medicine

## 2020-11-27 ENCOUNTER — Ambulatory Visit (HOSPITAL_BASED_OUTPATIENT_CLINIC_OR_DEPARTMENT_OTHER)
Admission: RE | Admit: 2020-11-27 | Discharge: 2020-11-27 | Disposition: A | Discharge status: Home / Self Care | Payer: Medicare Other | Source: Ambulatory Visit | Attending: Internal Medicine | Admitting: Internal Medicine

## 2020-11-27 ENCOUNTER — Other Ambulatory Visit: Payer: Self-pay

## 2020-11-27 DIAGNOSIS — J449 Chronic obstructive pulmonary disease, unspecified: Secondary | ICD-10-CM

## 2020-11-27 DIAGNOSIS — Z23 Encounter for immunization: Secondary | ICD-10-CM

## 2020-11-27 MED ORDER — PFIZER-BIONT COVID-19 VAC-TRIS 30 MCG/0.3ML IM SUSP
INTRAMUSCULAR | 0 refills | Status: AC
  Filled 2020-11-27: qty 0.3, 1d supply, fill #0

## 2020-11-27 NOTE — Progress Notes (Signed)
   Covid-[facility] Vaccination [facility]  Name:  MENDELL BONTEMPO    MRN: [id number] DOB: 07-16-1927  11/27/2020  Mr. Javid was observed post Covid-19 immunization for 15 minutes without incident. He was provided with Vaccine Information Sheet and instruction to access the V-Safe system.   Mr. Dauphinais was instructed to call 911 with any severe reactions post vaccine: Difficulty breathing  Swelling of face and throat  A fast heartbeat  A bad rash all over body  Dizziness and weakness   Immunizations Administered     Name Date Dose VIS Date Route   PFIZER Comrnaty(Gray TOP) Covid-19 Vaccine 11/27/2020 11:17 AM 0.3 mL 05/29/2020 Intramuscular   Manufacturer: [employer]   Lot: [id number]   [place]: [phone]

## 2020-12-05 ENCOUNTER — Other Ambulatory Visit: Payer: Self-pay

## 2020-12-05 ENCOUNTER — Ambulatory Visit (HOSPITAL_COMMUNITY)
Admission: RE | Admit: 2020-12-05 | Discharge: 2020-12-05 | Disposition: A | Discharge status: Home / Self Care | Payer: Medicare Other | Source: Ambulatory Visit | Attending: Internal Medicine | Admitting: Internal Medicine

## 2020-12-05 DIAGNOSIS — E785 Hyperlipidemia, unspecified: Secondary | ICD-10-CM | POA: Diagnosis not present

## 2020-12-05 DIAGNOSIS — R0609 Other forms of dyspnea: Secondary | ICD-10-CM | POA: Insufficient documentation

## 2020-12-05 DIAGNOSIS — R0600 Dyspnea, unspecified: Secondary | ICD-10-CM

## 2020-12-05 LAB — ECHOCARDIOGRAM COMPLETE
AR max vel: 1.5 cm2
AV Area VTI: 1.59 cm2
AV Area mean vel: 1.7 cm2
AV Mean grad: 9 mmHg
AV Peak grad: 18.8 mmHg
Ao pk vel: 2.17 m/s
Area-P 1/2: 2.66 cm2
S' Lateral: 2.6 cm

## 2020-12-08 NOTE — Progress Notes (Signed)
GERADO, NABERS ([id number]) Visit Report for 11/11/2020 Chief Complaint Document Details Patient Name: Date of Service: [name] [name] 11/11/2020 9:00 A M Medical Record Number: [id number] Patient Account Number: [id number] Date of Birth/Sex: Treating RN: March 29, 1928 (87 y.o. [name], [name] Primary Care Provider: Rich Number Other Clinician: Referring Provider: Treating Provider/Extender: [name] in Treatment: 0 Information Obtained from: Patient Chief Complaint Open wound With calvarium exposed Electronic Signature(s) Signed: 11/20/2020 1:56:12 PM By: Kalman Shan DO Entered By: Kalman Shan on 11/20/2020 13:22:43 -------------------------------------------------------------------------------- Debridement Details Patient Name: Date of Service: [name], [name] [name] 11/11/2020 9:00 A M Medical Record Number: [id number] Patient Account Number: [id number] Date of Birth/Sex: Treating RN: 1927/12/26 (87 y.o. [name] Primary Care Provider: Rich Number Other Clinician: Referring Provider: Treating Provider/Extender: [name] in Treatment: 0 Debridement Performed for Assessment: Wound #1 Head - frontal Performed By: Clinician Rhae Hammock, RN Debridement Type: Chemical/Enzymatic/Mechanical Agent Used: Anasept and gauze to remove biofilm Level of Consciousness (Pre-procedure): Awake and Alert Pre-procedure Verification/Time Out Yes - 11:08 Taken: Start Time: 11:08 Bleeding: None Response to Treatment: Procedure was tolerated well Level of Consciousness (Post- Awake and Alert procedure): Post Debridement Measurements of Total Wound Length: (cm) 6.3 Width: (cm) 4.8 Depth: (cm) 0.2 Volume: (cm) 4.75 Character of Wound/Ulcer Post Debridement: Stable Post Procedure Diagnosis Same as Pre-procedure Electronic Signature(s) Signed: 11/19/2020 9:55:24 AM By: Kalman Shan DO Signed:  12/08/2020 5:36:56 PM By: Levan Hurst RN, BSN Entered By: Levan Hurst on 11/13/2020 15:35:31 -------------------------------------------------------------------------------- HPI Details Patient Name: Date of Service: [name], JA [name] 11/11/2020 9:00 A M Medical Record Number: [id number] Patient Account Number: [id number] Date of Birth/Sex: Treating RN: Nov 06, 1927 (87 y.o. 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 The presents today for evaluation of hyperbaric oxygen for exposed calvarium from a previously removed squamous cell carcinoma lesion. He is currently using hydrogel to the area and keeping it covered. He takes doxycycline for chronic suppression of chronic osteomyelitis. He currently denies any signs of infection. He had Mohs surgery to remove the squamous cell carcinoma in 2019. He subsequently had radiation treatment from 02/02/2018 - 03/07/2018 to the left forehead with 5 5.00GY in 22 fractions using 6E electrons with a clinical set up technique. He had a nonhealing wound with exposed calvarium and And a full- thickness skin graft was attempted on 12/2018. on 07/13/2019 he was evaluated by Infectious disease for evaluation of purulent drainage. 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 He was given 6 weeks of IV antibiotics and is currently on lifelong doxycycline for suppression. Electronic Signature(s) Signed: 11/20/2020 1:56:12 PM By: Kalman Shan DO Entered By: Kalman Shan on 11/20/2020  13:32:00 -------------------------------------------------------------------------------- Physical Exam Details Patient Name: Date of Service: [name], [name] [name] 11/11/2020 9:00 A M Medical Record Number: [id number] Patient Account Number: [id number] Date of Birth/Sex: Treating RN: 08-08-1927 (87 y.o. [name] Primary Care Provider: Rich Number Other Clinician: Referring Provider: Treating Provider/Extender: [name] in Treatment: 0 Constitutional respirations regular, non-labored and within target range for patient.Marland Kitchen Psychiatric pleasant and cooperative.  Notes Exposed calvarium with yellow rigged appearance with rolled wound edges. No obvious signs of infection. Electronic Signature(s) Signed: 12/08/2020 5:32:40 PM By: Kalman Shan DO Previous Signature: 11/20/2020 1:56:12 PM Version By: Kalman Shan DO Entered By: Kalman Shan on 12/08/2020 17:32:13 -------------------------------------------------------------------------------- Physician Orders Details Patient Name: Date of Service: [name], [name] [name] 11/11/2020 9:00 A M Medical Record Number: [id number] Patient Account Number: [id number] Date of Birth/Sex: Treating RN: 1928-06-15 (87 y.o. [name], [name] Primary Care Provider: Rich Number Other Clinician: Referring Provider: Treating Provider/Extender: [name] in Treatment: 0 Verbal / Phone Orders: No Diagnosis Coding ICD-10 Coding Code Description S01.00XA Unspecified open wound of scalp, initial encounter M86.68 Other chronic osteomyelitis, other site J44.9 Chronic obstructive pulmonary disease, unspecified Z85.828 Personal history of other malignant neoplasm of skin Follow-up Appointments ppointment in 2 weeks. - Dr. Heber [place] Return A Bathing/ Shower/ Hygiene May shower with protection but do not get wound dressing(s) wet. Wound Treatment Wound #1 - Head -  frontal Cleanser: Wound Cleanser (Generic) 1 x Per Day/15 Days Discharge Instructions: Cleanse the wound with wound cleanser prior to applying a clean dressing using gauze sponges, not tissue or cotton balls. Prim Dressing: Fibracol Plus Dressing, 4x4.38 in (collagen) (Generic) 1 x Per Day/15 Days ary Discharge Instructions: Moisten collagen with saline or hydrogel Secondary Dressing: Zetuvit Plus Silicone Border Dressing 4x4 (in/in) (Generic) 1 x Per Day/15 Days Discharge Instructions: Apply silicone border over primary dressing as directed. Electronic Signature(s) Signed: 11/20/2020 1:56:12 PM By: Kalman Shan DO Previous Signature: 11/12/2020 4:26:30 PM Version By: Rhae Hammock RN Previous Signature: 11/19/2020 9:55:24 AM Version By: Kalman Shan DO Entered By: Kalman Shan on 11/20/2020 13:33:44 -------------------------------------------------------------------------------- Problem List Details Patient Name: Date of Service: [name], [name] [name] 11/11/2020 9:00 A M Medical Record Number: [id number] Patient Account Number: [id number] Date of Birth/Sex: Treating RN: [birth year] (87 y.o. [name] Primary Care Provider: Rich Number Other Clinician: Referring Provider: Treating Provider/Extender: [name] in Treatment: 0 Active Problems ICD-10 Encounter Code Description Active Date MDM Diagnosis S01.00XA Unspecified open wound of scalp, initial encounter 11/20/2020 No Yes J44.9 Chronic obstructive pulmonary disease, unspecified 11/20/2020 No Yes M86.68 Other chronic osteomyelitis, other site 11/20/2020 No Yes Z85.828 Personal history of other malignant neoplasm of skin 11/20/2020 No Yes Inactive Problems Resolved Problems Electronic Signature(s) Signed: 11/20/2020 1:56:12 PM By: Kalman Shan DO Entered By: Kalman Shan on 11/20/2020  13:21:21 -------------------------------------------------------------------------------- Progress Note Details Patient Name: Date of Service: [name], [name] [name] 11/11/2020 9:00 A M Medical Record Number: [id number] Patient Account Number: [id number] Date of Birth/Sex: Treating RN: 1928-02-08 (87 y.o. [name], [name] Primary Care Provider: Rich Number Other Clinician: Referring Provider: Treating Provider/Extender: [name] in Treatment: 0 Subjective Chief Complaint Information obtained from Patient Open wound With calvarium exposed History of Present Illness (HPI) Mr. 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 The presents today for evaluation of hyperbaric oxygen for exposed calvarium from a previously removed squamous cell carcinoma lesion. He is currently using hydrogel to the area and keeping it covered. He takes doxycycline for chronic suppression of chronic osteomyelitis. He currently denies any signs of infection. He had Mohs surgery to remove the squamous cell carcinoma in 2019. He subsequently had radiation treatment from 02/02/2018 - 03/07/2018 to the left  forehead with 5 5.00GY in 22 fractions using 6E electrons with a clinical set up technique. He had a nonhealing wound with exposed calvarium and And a full- thickness skin graft was attempted on 12/2018. on 07/13/2019 he was evaluated by Infectious disease for evaluation of purulent drainage. 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 He was given 6 weeks of IV antibiotics and is currently on lifelong doxycycline for suppression. Patient History Information obtained from Patient. Allergies Sulfa (Sulfonamide Antibiotics) (Reaction: anaphylaxis) Family  History Cancer - Father, Heart Disease - Mother, No family history of Diabetes, Hereditary Spherocytosis, Hypertension, Kidney Disease, Lung Disease, Seizures, Stroke, Thyroid Problems, Tuberculosis. Social History Former smoker, Marital Status - Married, Alcohol Use - Rarely, Drug Use - No History, Caffeine Use - Moderate. Medical History Eyes Patient has history of Cataracts - bil removed Denies history of Glaucoma, Optic Neuritis Ear/Nose/Mouth/Throat Denies history of Chronic sinus problems/congestion, Middle ear problems Respiratory Patient has history of Chronic Obstructive Pulmonary Disease (COPD) Denies history of Aspiration, Asthma, Pneumothorax, Sleep Apnea, Tuberculosis Gastrointestinal Denies history of Cirrhosis , Colitis, Crohnoos, Hepatitis A, Hepatitis B, Hepatitis C Endocrine Denies history of Type I Diabetes, Type II Diabetes Integumentary (Skin) Denies history of History of Burn Musculoskeletal Denies history of Gout, Rheumatoid Arthritis, Osteoarthritis, Osteomyelitis Oncologic Patient has history of Received Radiation - 20 treatments Denies history of Received Chemotherapy Psychiatric Denies history of Anorexia/bulimia, Confinement Anxiety Hospitalization/Surgery History - Moh's procedure to scalp. - tracheotomy as child. - bilateral cataracts removed. - left hip replacement. Medical A Surgical History Notes nd Endocrine hypothyroidism Oncologic sqamous cell carcinoma of the scalp Review of Systems (ROS) Constitutional Symptoms (General Health) Denies complaints or symptoms of Fatigue, Fever, Chills, Marked Weight Change. Eyes Complains or has symptoms of Glasses / Contacts - reading. Denies complaints or symptoms of Dry Eyes, Vision Changes. Ear/Nose/Mouth/Throat Denies complaints or symptoms of Chronic sinus problems or rhinitis. Respiratory Complains or has symptoms of Shortness of Breath - with exertion. Denies complaints or symptoms of Chronic or  frequent coughs. Cardiovascular Denies complaints or symptoms of Chest pain. Gastrointestinal Denies complaints or symptoms of Frequent diarrhea, Nausea, Vomiting. Endocrine Denies complaints or symptoms of Heat/cold intolerance. Genitourinary Denies complaints or symptoms of Frequent urination. Integumentary (Skin) Complains or has symptoms of Wounds - scalp. Musculoskeletal Denies complaints or symptoms of Muscle Pain, Muscle Weakness. Neurologic Denies complaints or symptoms of Numbness/parasthesias. Psychiatric Denies complaints or symptoms of Claustrophobia, Suicidal. Objective Constitutional respirations regular, non-labored and within target range for patient.. Vitals Time Taken: 9:40 AM, Height: 69 in, Source: Stated, Weight: 161 lbs, Source: Stated, BMI: 23.8, Temperature: 97.7 F, Pulse: 71 bpm, Respiratory Rate: 18 breaths/min, Blood Pressure: 180/74 mmHg. Psychiatric pleasant and cooperative. General Notes: Exposed calvarium with rolled wound edges. No obvious signs of infection. Integumentary (Hair, Skin) Wound #1 status is Open. Original cause of wound was Surgical Injury. The date acquired was: 12/06/2018. The wound is located on the Head - frontal. The wound measures 6.3cm length x 4.8cm width x 0.2cm depth; 23.75cm^2 area and 4.75cm^3 volume. There is bone exposed. There is no tunneling or undermining noted. There is a medium amount of serous drainage noted. The wound margin is epibole. There is small (1-33%) pink granulation within the wound bed. There is no necrotic tissue within the wound bed. Assessment Active Problems ICD-10 Unspecified open wound of scalp, initial encounter Chronic obstructive pulmonary disease, unspecified Other chronic osteomyelitis, other site Personal history of other malignant neoplasm of skin The  wound has no signs of infection. I recommended continuing hydrogel and adding collagen. Patient presents for evaluation of hyperbaric  oxygen. The only limiting factor here currently that I see is his COPD stage Gold 4. He states that he requires oxygen at night and gets short of breath walking across the room. I am not sure how well he would do in an HBO chamber as this is the equivalent of moderate exercise going down 33 feet below sea level. I will reach out to his pulmonologist to get further thoughts. If deemed safe we will attempt to get a chest x-ray and echocardiogram For further completion prior to insurance approval for HBO therapy. 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 There was a Chemical/Enzymatic/Mechanical debridement performed by Rhae Hammock, RN. to remove Non-Viable tissue/material.. Other agent used was Anasept and gauze to remove biofilm. A time out was conducted at 11:08, prior to the start of the procedure. There was no bleeding. The procedure was tolerated well. Post Debridement Measurements: 6.3cm length x 4.8cm width x 0.2cm depth; 4.75cm^3 volume. Character of Wound/Ulcer Post Debridement is stable. Post procedure Diagnosis Wound #1: Same as Pre-Procedure Plan Follow-up Appointments: Return Appointment in 2 weeks. - Dr. Heber [place] Bathing/ Shower/ Hygiene: May shower with protection but do not get wound dressing(s) wet. WOUND #1: - Head - frontal Wound Laterality: Cleanser: Wound Cleanser (Generic) 1 x Per Day/15 Days Discharge Instructions: Cleanse the wound with wound cleanser prior to applying a clean dressing using gauze sponges, not tissue or cotton balls. Prim Dressing: Fibracol Plus Dressing, 4x4.38 in (collagen) (Generic) 1 x Per Day/15 Days ary Discharge Instructions: Moisten collagen with saline or hydrogel Secondary Dressing: Zetuvit Plus Silicone Border Dressing 4x4 (in/in) (Generic) 1 x Per Day/15 Days Discharge Instructions: Apply silicone border over primary dressing as directed. 1. Continue hydrogel  and add collagen 2. Discussed with pulmonology 3. Follow-up in 2 weeks Electronic Signature(s) Signed: 12/02/2020 3:44:02 PM By: Linton Ham MD Signed: 12/08/2020 1:14:48 PM By: Kalman Shan DO Previous Signature: 11/20/2020 1:56:12 PM Version By: Kalman Shan DO Entered By: Linton Ham on 12/02/2020 15:44:02 -------------------------------------------------------------------------------- HxROS Details Patient Name: Date of Service: [name], [name] [name] 11/11/2020 9:00 A M Medical Record Number: [id number] Patient Account Number: [id number] Date of Birth/Sex: Treating RN: 1927/10/12 (87 y.o. [name] Primary Care Provider: Rich Number Other Clinician: Referring Provider: Treating Provider/Extender: [name] in Treatment: 0 Information Obtained From Patient Constitutional Symptoms (General Health) Complaints and Symptoms: Negative for: Fatigue; Fever; Chills; Marked Weight Change Eyes Complaints and Symptoms: Positive for: Glasses / Contacts - reading Negative for: Dry Eyes; Vision Changes Medical History: Positive for: Cataracts - bil removed Negative for: Glaucoma; Optic Neuritis Ear/Nose/Mouth/Throat Complaints and Symptoms: Negative for: Chronic sinus problems or rhinitis Medical History: Negative for: Chronic sinus problems/congestion; Middle ear problems Respiratory Complaints and Symptoms: Positive for: Shortness of Breath - with exertion Negative for: Chronic or frequent coughs Medical History: Positive for: Chronic Obstructive Pulmonary Disease (COPD) Negative for: Aspiration; Asthma; Pneumothorax; Sleep Apnea; Tuberculosis Cardiovascular Complaints and Symptoms: Negative for: Chest pain Gastrointestinal Complaints and Symptoms: Negative for: Frequent diarrhea; Nausea; Vomiting Medical History: Negative for: Cirrhosis ; Colitis; Crohns; Hepatitis A; Hepatitis B; Hepatitis C Endocrine Complaints and  Symptoms: Negative for: Heat/cold intolerance Medical History: Negative for: Type I Diabetes; Type II Diabetes Past Medical History Notes: hypothyroidism Genitourinary Complaints and Symptoms: Negative for: Frequent urination Integumentary (Skin) Complaints  and Symptoms: Positive for: Wounds - scalp Medical History: Negative for: History of Burn Musculoskeletal Complaints and Symptoms: Negative for: Muscle Pain; Muscle Weakness Medical History: Negative for: Gout; Rheumatoid Arthritis; Osteoarthritis; Osteomyelitis Neurologic Complaints and Symptoms: Negative for: Numbness/parasthesias Psychiatric Complaints and Symptoms: Negative for: Claustrophobia; Suicidal Medical History: Negative for: Anorexia/bulimia; Confinement Anxiety Hematologic/Lymphatic Immunological Oncologic Medical History: Positive for: Received Radiation - 20 treatments Negative for: Received Chemotherapy Past Medical History Notes: sqamous cell carcinoma of the scalp HBO Extended History Items Eyes: Cataracts Immunizations Pneumococcal Vaccine: Received Pneumococcal Vaccination: No Implantable Devices No devices added Hospitalization / Surgery History Type of Hospitalization/Surgery Moh's procedure to scalp tracheotomy as child bilateral cataracts removed left hip replacement Family and Social History Cancer: Yes - Father; Diabetes: No; Heart Disease: Yes - Mother; Hereditary Spherocytosis: No; Hypertension: No; Kidney Disease: No; Lung Disease: No; Seizures: No; Stroke: No; Thyroid Problems: No; Tuberculosis: No; Former smoker; Marital Status - Married; Alcohol Use: Rarely; Drug Use: No History; Caffeine Use: Moderate; Financial Concerns: No; Food, Clothing or Shelter Needs: No; Support System Lacking: No; Transportation Concerns: No Engineer, maintenance) Signed: 11/11/2020 5:08:08 PM By: Baruch Gouty RN, BSN Signed: 11/19/2020 9:55:24 AM By: Kalman Shan DO Entered By: Baruch Gouty  on 11/11/2020 09:56:42 -------------------------------------------------------------------------------- SuperBill Details Patient Name: Date of Service: [name], [name] [name] 11/11/2020 Medical Record Number: [id number] Patient Account Number: [id number] Date of Birth/Sex: Treating RN: [birth year] (87 y.o. [name], [name] Primary Care Provider: Rich Number Other Clinician: Referring Provider: Treating Provider/Extender: [name] in Treatment: 0 Diagnosis Coding ICD-10 Codes Code Description S01.00XA Unspecified open wound of scalp, initial encounter J44.9 Chronic obstructive pulmonary disease, unspecified M86.68 Other chronic osteomyelitis, other site Z85.828 Personal history of other malignant neoplasm of skin Facility Procedures CPT4 Code: [id number] Description: [place] VISIT-LEV 4 EST PT Modifier: 25 Quantity: 1 CPT4 Code: [id number] Description: 57017 - [place] W/O ANES NON SELECT Modifier: Quantity: 1 Physician Procedures : CPT4 Code Description Modifier [id number] WC PHYS LEVEL 3 NEW PT ICD-10 Diagnosis Description S01.00XA Unspecified open wound of scalp, initial encounter J44.9 Chronic obstructive pulmonary disease, unspecified M86.68 Other chronic osteomyelitis, other  site Z85.828 Personal history of other malignant neoplasm of skin Quantity: 1 Electronic Signature(s) Signed: 11/20/2020 1:56:12 PM By: Kalman Shan DO Previous Signature: 11/19/2020 9:55:24 AM Version By: Kalman Shan DO Previous Signature: 11/12/2020 4:26:30 PM Version By: Rhae Hammock RN Entered By: Kalman Shan on 11/20/2020 13:38:31

## 2020-12-09 ENCOUNTER — Other Ambulatory Visit: Payer: Self-pay

## 2020-12-09 ENCOUNTER — Encounter (HOSPITAL_BASED_OUTPATIENT_CLINIC_OR_DEPARTMENT_OTHER): Payer: Medicare Other | Admitting: Internal Medicine

## 2020-12-09 DIAGNOSIS — J449 Chronic obstructive pulmonary disease, unspecified: Secondary | ICD-10-CM

## 2020-12-09 DIAGNOSIS — S0180XA Unspecified open wound of other part of head, initial encounter: Secondary | ICD-10-CM | POA: Diagnosis not present

## 2020-12-09 DIAGNOSIS — S0100XD Unspecified open wound of scalp, subsequent encounter: Secondary | ICD-10-CM | POA: Diagnosis not present

## 2020-12-09 DIAGNOSIS — M8668 Other chronic osteomyelitis, other site: Secondary | ICD-10-CM | POA: Diagnosis not present

## 2020-12-09 DIAGNOSIS — Z85828 Personal history of other malignant neoplasm of skin: Secondary | ICD-10-CM

## 2020-12-09 NOTE — Progress Notes (Addendum)
MACALLISTER, ASHMEAD ([id number]) Visit Report for 12/09/2020 Chief Complaint Document Details Patient Name: Date of Service: [name] [name] 12/09/2020 2:45 PM Medical Record Number: [id number] Patient Account Number: [id number] Date of Birth/Sex: Treating RN: 04/01/1928 (87 y.o. Hessie Diener Primary Care Provider: Rich Number Other Clinician: Referring Provider: Treating Provider/Extender: [name] in Treatment: 4 Information Obtained from: Patient Chief Complaint Open wound With calvarium exposed Electronic Signature(s) Signed: 12/09/2020 5:18:50 PM By: Kalman Shan DO Entered By: Kalman Shan on 12/09/2020 16:51:45 -------------------------------------------------------------------------------- HPI Details Patient Name: Date of Service: [name], [name] 12/09/2020 2:45 PM Medical Record Number: [id number] Patient Account Number: [id number] Date of Birth/Sex: Treating RN: May 13, 1928 (87 y.o. Hessie Diener Primary Care Provider: Rich Number Other Clinician: Referring Provider: Treating Provider/Extender: [name] in Treatment: 4 History of Present Illness HPI Description: Mr. 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 The presents today for evaluation of hyperbaric oxygen for exposed calvarium from a previously removed squamous cell carcinoma lesion. He is currently using hydrogel to the area and keeping it covered. He takes doxycycline for chronic suppression of chronic osteomyelitis. He currently denies any signs of infection. He had Mohs surgery to remove the squamous cell carcinoma in 2019. He subsequently had radiation treatment from 02/02/2018 - 03/07/2018 to the left forehead with 5 5.00GY in 22 fractions  using 6E electrons with a clinical set up technique. He had a nonhealing wound with exposed calvarium and And a full- thickness skin graft was attempted on 12/2018. on 07/13/2019 he was evaluated by Infectious disease for evaluation of purulent drainage. 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 He was given 6 weeks of IV antibiotics and is currently on lifelong doxycycline for suppression. 6/7; patient presents for 2-week follow-up. He has been using collagen with hydrogel to the wound bed. He reports no issues with this. He denies signs of acute infection. 6/21; patient presents for 2-week follow-up. He has been using collagen with hydrogel to the wound bed. He denies acute signs of infection. He completed his chest x-ray and echocardiogram for approval of hyperbaric oxygen. At this time he would like to complete the approval process however does not want to start HBO therapy at this time. His wife is concerned about his COPD being an issue for HBO therapy and they would like more time to think about it. Electronic Signature(s) Signed: 12/09/2020 5:18:50 PM By: Kalman Shan DO Entered By: Kalman Shan on 12/09/2020 16:56:38 -------------------------------------------------------------------------------- Physical Exam Details Patient Name: Date of Service: [name], [name] 12/09/2020 2:45 PM Medical Record Number: [id number] Patient Account Number: [id number] Date of Birth/Sex: Treating RN: 02/14/1928 (87 y.o. Hessie Diener Primary Care Provider: Rich Number Other Clinician: Referring Provider: Treating Provider/Extender: [name] in Treatment: 4 Constitutional respirations regular, non-labored and within target range for patient.Marland Kitchen Psychiatric pleasant and cooperative. Notes Exposed calvarium with yellow rigid surface. No obvious signs of infection Electronic Signature(s) Signed: 12/09/2020  5:18:50 PM By: Kalman Shan DO Entered By: Kalman Shan on 12/09/2020 16:57:39 -------------------------------------------------------------------------------- Physician Orders Details Patient Name: Date of Service: [name], [name] 12/09/2020 2:45 PM Medical Record Number: [id number] Patient Account Number: [id number] Date of Birth/Sex: Treating RN: 1928-04-01 (87 y.o. M) Rolin Barry, Tammi Klippel  Primary Care Provider: Rich Number Other Clinician: Referring Provider: Treating Provider/Extender: [name] in Treatment: 4 Verbal / Phone Orders: No Diagnosis Coding ICD-10 Coding Code Description S01.00XD Unspecified open wound of scalp, subsequent encounter J44.9 Chronic obstructive pulmonary disease, unspecified M86.68 Other chronic osteomyelitis, other site Z85.828 Personal history of other malignant neoplasm of skin Follow-up Appointments ppointment in: - 6 weeks Dr. Heber Cutler Return A Bathing/ Shower/ Hygiene May shower with protection but do not get wound dressing(s) wet. Hyperbaric Oxygen Therapy Wound #1 Head - frontal Other - Hold off on Hyberbarics for now. Wound Treatment Wound #1 - Head - frontal Cleanser: Wound Cleanser (DME) (Generic) Every Other Day/30 Days Discharge Instructions: Cleanse the wound with wound cleanser prior to applying a clean dressing using gauze sponges, not tissue or cotton balls. Prim Dressing: Fibracol Plus Dressing, 4x4.38 in (collagen) (Generic) Every Other Day/30 Days ary Discharge Instructions: Moisten collagen with saline or hydrogel Secondary Dressing: Zetuvit Plus Silicone Border Dressing 4x4 (in/in) (DME) (Generic) Every Other Day/30 Days Discharge Instructions: Apply silicone border over primary dressing as directed. Electronic Signature(s) Signed: 12/09/2020 5:18:50 PM By: Kalman Shan DO Previous Signature: 12/09/2020 4:54:56 PM Version By: Deon Pilling Entered By: Kalman Shan on 12/09/2020  16:57:54 -------------------------------------------------------------------------------- Problem List Details Patient Name: Date of Service: [name], [name] 12/09/2020 2:45 PM Medical Record Number: [id number] Patient Account Number: [id number] Date of Birth/Sex: Treating RN: 11-Oct-1927 (87 y.o. Gilman Buttner Primary Care Provider: Rich Number Other Clinician: Referring Provider: Treating Provider/Extender: [name] in Treatment: 4 Active Problems ICD-10 Encounter Code Description Active Date MDM Diagnosis S01.00XD Unspecified open wound of scalp, subsequent encounter 11/20/2020 No Yes J44.9 Chronic obstructive pulmonary disease, unspecified 11/20/2020 No Yes M86.68 Other chronic osteomyelitis, other site 11/20/2020 No Yes Z85.828 Personal history of other malignant neoplasm of skin 11/20/2020 No Yes Inactive Problems Resolved Problems Electronic Signature(s) Signed: 12/09/2020 5:18:50 PM By: Kalman Shan DO Entered By: Kalman Shan on 12/09/2020 16:51:22 -------------------------------------------------------------------------------- Progress Note Details Patient Name: Date of Service: [name], [name] 12/09/2020 2:45 PM Medical Record Number: [id number] Patient Account Number: [id number] Date of Birth/Sex: Treating RN: 11-22-1927 (87 y.o. Hessie Diener Primary Care Provider: Rich Number Other Clinician: Referring Provider: Treating Provider/Extender: [name] in Treatment: 4 Subjective Chief Complaint Information obtained from Patient Open wound With calvarium exposed History of Present Illness (HPI) Mr. 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 The presents today for evaluation of hyperbaric oxygen  for exposed calvarium from a previously removed squamous cell carcinoma lesion. He is currently using hydrogel to the area and keeping it covered. He takes doxycycline for chronic suppression of chronic osteomyelitis. He currently denies any signs of infection. He had Mohs surgery to remove the squamous cell carcinoma in 2019. He subsequently had radiation treatment from 02/02/2018 - 03/07/2018 to the left forehead with 5 5.00GY in 22 fractions using 6E electrons with a clinical set up technique. He had a nonhealing wound with exposed calvarium and And a full- thickness skin graft was attempted on 12/2018. on 07/13/2019 he was evaluated by Infectious disease for evaluation of purulent drainage. 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 He was given 6 weeks of IV antibiotics and is currently on lifelong doxycycline for suppression.  6/7; patient presents for 2-week follow-up. He has been using collagen with hydrogel to the wound bed. He reports no issues with this. He denies signs of acute infection. 6/21; patient presents for 2-week follow-up. He has been using collagen with hydrogel to the wound bed. He denies acute signs of infection. He completed his chest x-ray and echocardiogram for approval of hyperbaric oxygen. At this time he would like to complete the approval process however does not want to start HBO therapy at this time. His wife is concerned about his COPD being an issue for HBO therapy and they would like more time to think about it. Patient History Information obtained from Patient. Family History Cancer - Father, Heart Disease - Mother, No family history of Diabetes, Hereditary Spherocytosis, Hypertension, Kidney Disease, Lung Disease, Seizures, Stroke, Thyroid Problems, Tuberculosis. Social History Former smoker, Marital Status - Married, Alcohol Use - Rarely, Drug Use - No History, Caffeine Use - Moderate. Medical History Eyes Patient  has history of Cataracts - bil removed Denies history of Glaucoma, Optic Neuritis Ear/Nose/Mouth/Throat Denies history of Chronic sinus problems/congestion, Middle ear problems Respiratory Patient has history of Chronic Obstructive Pulmonary Disease (COPD) Denies history of Aspiration, Asthma, Pneumothorax, Sleep Apnea, Tuberculosis Gastrointestinal Denies history of Cirrhosis , Colitis, Crohnoos, Hepatitis A, Hepatitis B, Hepatitis C Endocrine Denies history of Type I Diabetes, Type II Diabetes Integumentary (Skin) Denies history of History of Burn Musculoskeletal Denies history of Gout, Rheumatoid Arthritis, Osteoarthritis, Osteomyelitis Oncologic Patient has history of Received Radiation - 20 treatments Denies history of Received Chemotherapy Psychiatric Denies history of Anorexia/bulimia, Confinement Anxiety Hospitalization/Surgery History - Moh's procedure to scalp. - tracheotomy as child. - bilateral cataracts removed. - left hip replacement. Medical A Surgical History Notes nd Endocrine hypothyroidism Oncologic sqamous cell carcinoma of the scalp Objective Constitutional respirations regular, non-labored and within target range for patient.. Vitals Time Taken: 2:40 PM, Height: 69 in, Weight: 161 lbs, BMI: 23.8, Temperature: 98.1 F, Pulse: 86 bpm, Respiratory Rate: 16 breaths/min, Blood Pressure: 188/74 mmHg. Psychiatric pleasant and cooperative. General Notes: Exposed calvarium with yellow rigid surface. No obvious signs of infection Integumentary (Hair, Skin) Wound #1 status is Open. Original cause of wound was Surgical Injury. The date acquired was: 12/06/2018. The wound has been in treatment 4 weeks. The wound is located on the Head - frontal. The wound measures 6.4cm length x 4.5cm width x 0.2cm depth; 22.619cm^2 area and 4.524cm^3 volume. There is bone exposed. There is no tunneling or undermining noted. There is a medium amount of serous drainage noted. The wound  margin is epibole. There is small (1- 33%) pink granulation within the wound bed. There is a large (67-100%) amount of necrotic tissue within the wound bed including Adherent Slough. Assessment Active Problems ICD-10 Unspecified open wound of scalp, subsequent encounter Chronic obstructive pulmonary disease, unspecified Other chronic osteomyelitis, other site Personal history of other malignant neoplasm of skin HBO Evaluation Reason for HBO Patient had a biopsy on his left scalp that confirmed infiltrating squamous cell carcinoma with perineural invasion extending to the lateral margin on 01/10/2018. He subsequently had MOHS excision followed by radiation treatment 02/02/2018 - 03/07/2018, received 55.00 Gy in 22 fractions using 6e electrons . He had a graft placed 02/07/2019 but did not take. He had exposed calvarium and developed osteomyelitis. He was evaluated on 07/02/2019 by infectious disease and diagnosed with osteomyelitis and started treatment with IV antibiotics (cefepime) on 07/16/19 and completed 6 weeks of IV antibiotics. He continues to take lifelong prophylaxis with doxycycline  for Chronic osteomyelitis. Site of CRO Left scalp with exposed calvarium Imaging studies to support CRO Chronically Exposed bone, no imaging. 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 No evidence of malignancy Antibiotic outcome Cefepime start date: 07/16/19 and end date 08/27/19 Surgical resection outcome He had a graft placed 02/07/2019 but did not take likely because previous radiation exposure Labs reviewed <span class="text-muted font-italic">No provider impression available</span> Plan of care/Summary Patient has chronic refractory osteomyelitis. He has exposed calvarium due to poor wound healing. He has completed 6 weeks of IV antibiotics and will be on lifelong prophylaxis with doxycycline. The  patient is a candidate for hyperbaric oxygen therapy at 2.0 atm with two 5-minute air breaks 40 treatments. This will be in addition to continued antibiotic therapy. He has a history of COPD and case was discussed with his pulmonologist who thought that HBO would be low risk for barotrauma. The goal of therapy would be to promote wound healing over the calvarium along with continued wound care. Chest x-ray and Echocardiogram have been ordered. Pending these results and approval will proceed with HBO. Patient's wound is stable. He has chronic refractory osteomyelitis and is on lifelong prophylaxis with doxycycline. Despite 6 weeks of IV antibiotics and wound care he has been unable to close the wound in the past 2 years. HBO may give him a chance to close the wound along with continued wound care. His chest x-ray showed hyperinflated lungs and his echocardiogram showed a normal LVEF. Neither would preclude him from doing HBO. Plan Follow-up Appointments: Return Appointment in: - 6 weeks Dr. Heber  Bathing/ Shower/ Hygiene: May shower with protection but do not get wound dressing(s) wet. Hyperbaric Oxygen Therapy: Wound #1 Head - frontal: Other - Hold off on Hyberbarics for now. WOUND #1: - Head - frontal Wound Laterality: Cleanser: Wound Cleanser (DME) (Generic) Every Other Day/30 Days Discharge Instructions: Cleanse the wound with wound cleanser prior to applying a clean dressing using gauze sponges, not tissue or cotton balls. Prim Dressing: Fibracol Plus Dressing, 4x4.38 in (collagen) (Generic) Every Other Day/30 Days ary Discharge Instructions: Moisten collagen with saline or hydrogel Secondary Dressing: Zetuvit Plus Silicone Border Dressing 4x4 (in/in) (DME) (Generic) Every Other Day/30 Days Discharge Instructions: Apply silicone border over primary dressing as directed. 1. Continue collagen with hydrogel 2. Follow-up in 6 weeks to [place] Signature(s) Signed:  12/12/2020 12:12:56 PM By: Kalman Shan DO Previous Signature: 12/09/2020 5:18:50 PM Version By: Kalman Shan DO Entered By: Kalman Shan on 12/12/2020 12:10:46 -------------------------------------------------------------------------------- HxROS Details Patient Name: Date of Service: [name], [name] 12/09/2020 2:45 PM Medical Record Number: [id number] Patient Account Number: [id number] Date of Birth/Sex: Treating RN: [birth year] (87 y.o. Hessie Diener Primary Care Provider: Rich Number Other Clinician: Referring Provider: Treating Provider/Extender: [name] in Treatment: 4 Information Obtained From Patient Eyes Medical History: Positive for: Cataracts - bil removed Negative for: Glaucoma; Optic Neuritis Ear/Nose/Mouth/Throat Medical History: Negative for: Chronic sinus problems/congestion; Middle ear problems Respiratory Medical History: Positive for: Chronic Obstructive Pulmonary Disease (COPD) Negative for: Aspiration; Asthma; Pneumothorax; Sleep Apnea; Tuberculosis Gastrointestinal Medical History: Negative for: Cirrhosis ; Colitis; Crohns; Hepatitis A; Hepatitis B; Hepatitis C Endocrine Medical History: Negative for: Type I Diabetes; Type II Diabetes Past Medical History Notes: hypothyroidism Integumentary (Skin) Medical History: Negative for: History of Burn Musculoskeletal Medical History: Negative for: Gout; Rheumatoid Arthritis; Osteoarthritis; Osteomyelitis Oncologic Medical History: Positive for: Received  Radiation - 20 treatments Negative for: Received Chemotherapy Past Medical History Notes: sqamous cell carcinoma of the scalp Psychiatric Medical History: Negative for: Anorexia/bulimia; Confinement Anxiety HBO Extended History Items Eyes: Cataracts Immunizations Pneumococcal Vaccine: Received Pneumococcal Vaccination: No Implantable Devices No devices added Hospitalization / Surgery  History Type of Hospitalization/Surgery Moh's procedure to scalp tracheotomy as child bilateral cataracts removed left hip replacement Family and Social History Cancer: Yes - Father; Diabetes: No; Heart Disease: Yes - Mother; Hereditary Spherocytosis: No; Hypertension: No; Kidney Disease: No; Lung Disease: No; Seizures: No; Stroke: No; Thyroid Problems: No; Tuberculosis: No; Former smoker; Marital Status - Married; Alcohol Use: Rarely; Drug Use: No History; Caffeine Use: Moderate; Financial Concerns: No; Food, Clothing or Shelter Needs: No; Support System Lacking: No; Transportation Concerns: No Electronic Signature(s) Signed: 12/09/2020 5:18:50 PM By: Kalman Shan DO Signed: 12/09/2020 5:27:27 PM By: Deon Pilling Entered By: Kalman Shan on 12/09/2020 16:56:44 -------------------------------------------------------------------------------- SuperBill Details Patient Name: Date of Service: [name], [name] 12/09/2020 Medical Record Number: [id number] Patient Account Number: [id number] Date of Birth/Sex: Treating RN: 28-Jan-1928 (87 y.o. Hessie Diener Primary Care Provider: Rich Number Other Clinician: Referring Provider: Treating Provider/Extender: [name] in Treatment: 4 Diagnosis Coding ICD-10 Codes Code Description S01.00XD Unspecified open wound of scalp, subsequent encounter J44.9 Chronic obstructive pulmonary disease, unspecified M86.68 Other chronic osteomyelitis, other site Z85.828 Personal history of other malignant neoplasm of skin Facility Procedures Physician Procedures : CPT4 Code Description Modifier [id number] - WC PHYS LEVEL 3 - EST PT ICD-10 Diagnosis Description S01.00XD Unspecified open wound of scalp, subsequent encounter J44.9 Chronic obstructive pulmonary disease, unspecified M86.68 Other chronic  osteomyelitis, other site Z85.828 Personal history of other malignant neoplasm of skin Quantity: 1 Electronic  Signature(s) Signed: 12/09/2020 5:18:50 PM By: Kalman Shan DO Previous Signature: 12/09/2020 4:54:56 PM Version By: Deon Pilling Entered By: Kalman Shan on 12/09/2020 17:04:17

## 2020-12-11 NOTE — Progress Notes (Signed)
EMILLIANO, DILWORTH ([id number]) Visit Report for 12/09/2020 Arrival Information Details Patient Name: Date of Service: [name] [name] 12/09/2020 2:45 PM Medical Record Number: [id number] Patient Account Number: [id number] Date of Birth/Sex: Treating RN: 05-19-1928 (87 y.o. Gilman Buttner Primary Care Sanvika Cuttino: Rich Number Other Clinician: Referring Reily Treloar: Treating Laretha Luepke/Extender: [name] in Treatment: 4 Visit Information History Since Last Visit All ordered tests and consults were completed: No Patient Arrived: Ambulatory Added or deleted any medications: No Arrival Time: 14:47 Any new allergies or adverse reactions: No Accompanied By: spouse Had a fall or experienced change in No Transfer Assistance: None activities of daily living that may affect Patient Identification Verified: Yes risk of falls: Secondary Verification Process Completed: Yes Signs or symptoms of abuse/neglect since last visito No Patient Requires Transmission-Based Precautions: No Hospitalized since last visit: No Patient Has Alerts: No Implantable device outside of the clinic excluding No cellular tissue based products placed in the center since last visit: Has Dressing in Place as Prescribed: Yes Pain Present Now: No Electronic Signature(s) Signed: 12/11/2020 8:38:46 AM By: Leane Call Entered By: Leane Call on 12/09/2020 14:51:34 -------------------------------------------------------------------------------- Clinic Level of Care Assessment Details Patient Name: Date of Service: [name] [name] 12/09/2020 2:45 PM Medical Record Number: [id number] Patient Account Number: [id number] Date of Birth/Sex: Treating RN: 1928/05/27 (87 y.o. Lorette Ang, Meta.Reding Primary Care Caylyn Tedeschi: Rich Number Other Clinician: Referring Loel Betancur: Treating Kaena Santori/Extender: [name] in Treatment: 4 Clinic Level of Care Assessment  Items TOOL 4 Quantity Score X- 1 0 Use when only an EandM is performed on FOLLOW-UP visit ASSESSMENTS - Nursing Assessment / Reassessment X- 1 10 Reassessment of Co-morbidities (includes updates in patient status) X- 1 5 Reassessment of Adherence to Treatment Plan ASSESSMENTS - Wound and Skin A ssessment / Reassessment X - Simple Wound Assessment / Reassessment - one wound 1 5 []  - 0 Complex Wound Assessment / Reassessment - multiple wounds X- 1 10 Dermatologic / Skin Assessment (not related to wound area) ASSESSMENTS - Focused Assessment []  - 0 Circumferential Edema Measurements - multi extremities X- 1 10 Nutritional Assessment / Counseling / Intervention []  - 0 Lower Extremity Assessment (monofilament, tuning fork, pulses) []  - 0 Peripheral Arterial Disease Assessment (using hand held doppler) ASSESSMENTS - Ostomy and/or Continence Assessment and Care []  - 0 Incontinence Assessment and Management []  - 0 Ostomy Care Assessment and Management (repouching, etc.) PROCESS - Coordination of Care X - Simple Patient / Family Education for ongoing care 1 15 []  - 0 Complex (extensive) Patient / Family Education for ongoing care X- 1 10 Staff obtains Programmer, systems, Records, T Results / Process Orders est []  - 0 Staff telephones HHA, Nursing Homes / Clarify orders / etc []  - 0 Routine Transfer to another Facility (non-emergent condition) []  - 0 Routine Hospital Admission (non-emergent condition) []  - 0 New Admissions / Biomedical engineer / Ordering NPWT Apligraf, etc. , []  - 0 Emergency Hospital Admission (emergent condition) X- 1 10 Simple Discharge Coordination []  - 0 Complex (extensive) Discharge Coordination PROCESS - Special Needs []  - 0 Pediatric / Minor Patient Management []  - 0 Isolation Patient Management []  - 0 Hearing / Language / Visual special needs []  - 0 Assessment of Community assistance (transportation, D/C planning, etc.) []  - 0 Additional  assistance / Altered mentation []  - 0 Support Surface(s) Assessment (bed, cushion, seat, etc.) INTERVENTIONS - Wound Cleansing / Measurement X - Simple Wound Cleansing - one wound 1 5 []  -  0 Complex Wound Cleansing - multiple wounds X- 1 5 Wound Imaging (photographs - any number of wounds) []  - 0 Wound Tracing (instead of photographs) X- 1 5 Simple Wound Measurement - one wound []  - 0 Complex Wound Measurement - multiple wounds INTERVENTIONS - Wound Dressings X - Small Wound Dressing one or multiple wounds 1 10 []  - 0 Medium Wound Dressing one or multiple wounds []  - 0 Large Wound Dressing one or multiple wounds []  - 0 Application of Medications - topical []  - 0 Application of Medications - injection INTERVENTIONS - Miscellaneous []  - 0 External ear exam []  - 0 Specimen Collection (cultures, biopsies, blood, body fluids, etc.) []  - 0 Specimen(s) / Culture(s) sent or taken to Lab for analysis []  - 0 Patient Transfer (multiple staff / Civil Service fast streamer / Similar devices) []  - 0 Simple Staple / Suture removal (25 or less) []  - 0 Complex Staple / Suture removal (26 or more) []  - 0 Hypo / Hyperglycemic Management (close monitor of Blood Glucose) []  - 0 Ankle / Brachial Index (ABI) - do not check if billed separately X- 1 5 Vital Signs Has the patient been seen at the hospital within the last three years: Yes Total Score: 105 Level Of Care: New/Established - Level 3 Electronic Signature(s) Signed: 12/09/2020 4:54:56 PM By: Deon Pilling Entered By: Deon Pilling on 12/09/2020 16:14:57 -------------------------------------------------------------------------------- Encounter Discharge Information Details Patient Name: Date of Service: [name], [name] 12/09/2020 2:45 PM Medical Record Number: [id number] Patient Account Number: [id number] Date of Birth/Sex: Treating RN: 18-Jan-1928 (87 y.o. Gilman Buttner Primary Care Cezar Misiaszek: Rich Number Other Clinician: Referring  Damiana Berrian: Treating Taniesha Glanz/Extender: [name] in Treatment: 4 Encounter Discharge Information Items Discharge Condition: Stable Ambulatory Status: Ambulatory Discharge Destination: Home Transportation: Private Auto Accompanied By: wife Schedule Follow-up Appointment: No Clinical Summary of Care: Provided Form Type Recipient Paper Patient patient Electronic Signature(s) Signed: 12/11/2020 8:38:46 AM By: Leane Call Entered By: Leane Call on 12/09/2020 16:41:38 -------------------------------------------------------------------------------- Lower Extremity Assessment Details Patient Name: Date of Service: [name] [name] 12/09/2020 2:45 PM Medical Record Number: [id number] Patient Account Number: [id number] Date of Birth/Sex: Treating RN: 1928-02-01 (87 y.o. Gilman Buttner Primary Care Lanaiya Lantry: Rich Number Other Clinician: Referring Nessie Nong: Treating Dreanna Kyllo/Extender: [name] in Treatment: 4 Electronic Signature(s) Signed: 12/11/2020 8:38:46 AM By: Leane Call Entered By: Leane Call on 12/09/2020 14:48:44 -------------------------------------------------------------------------------- Multi Wound Chart Details Patient Name: Date of Service: [name], [name] 12/09/2020 2:45 PM Medical Record Number: [id number] Patient Account Number: [id number] Date of Birth/Sex: Treating RN: [birth year] (87 y.o. Hessie Diener Primary Care Shown Dissinger: Rich Number Other Clinician: Referring Rondale Nies: Treating Chania Kochanski/Extender: [name] in Treatment: 4 Vital Signs Height(in): 69 Pulse(bpm): 110 Weight(lbs): 161 Blood Pressure(mmHg): 188/74 Body Mass Index(BMI): 24 Temperature(F): 98.1 Respiratory Rate(breaths/min): 16 Photos: [N/A:N/A] Head - frontal N/A N/A Wound Location: Surgical Injury N/A N/A Wounding Event: Soft Tissue Radionecrosis N/A  N/A Primary Etiology: Open Surgical Wound N/A N/A Secondary Etiology: Cataracts, Chronic Obstructive N/A N/A Comorbid History: Pulmonary Disease (COPD), Received Radiation 12/06/2018 N/A N/A Date Acquired: 4 N/A N/A Weeks of Treatment: Open N/A N/A Wound Status: 6.4x4.5x0.2 N/A N/A Measurements L x W x D (cm) 22.619 N/A N/A A (cm) : rea 4.524 N/A N/A Volume (cm) : 4.80% N/A N/A % Reduction in Area: 4.80% N/A N/A % Reduction in Volume: Full Thickness With Exposed Support N/A N/A Classification: Structures Medium N/A N/A Exudate Amount: Serous  N/A N/A Exudate Type: amber N/A N/A Exudate Color: Epibole N/A N/A Wound Margin: Small (1-33%) N/A N/A Granulation Amount: Pink N/A N/A Granulation Quality: Large (67-100%) N/A N/A Necrotic Amount: Bone: Yes N/A N/A Exposed Structures: Fascia: No Fat Layer (Subcutaneous Tissue): No Tendon: No Muscle: No Joint: No None N/A N/A Epithelialization: Treatment Notes Wound #1 (Head - frontal) Cleanser Wound Cleanser Discharge Instruction: Cleanse the wound with wound cleanser prior to applying a clean dressing using gauze sponges, not tissue or cotton balls. Peri-Wound Care Topical Primary Dressing Fibracol Plus Dressing, 4x4.38 in (collagen) Discharge Instruction: Moisten collagen with saline or hydrogel Secondary Dressing Zetuvit Plus Silicone Border Dressing 4x4 (in/in) Discharge Instruction: Apply silicone border over primary dressing as directed. Secured With Compression Wrap Compression Stockings Environmental education officer) Signed: 12/09/2020 4:54:56 PM By: Deon Pilling Signed: 12/09/2020 5:18:50 PM By: Kalman Shan DO Entered By: Kalman Shan on 12/09/2020 16:51:38 -------------------------------------------------------------------------------- Multi-Disciplinary Care Plan Details Patient Name: Date of Service: [name], [name] 12/09/2020 2:45 PM Medical Record Number: [id number] Patient  Account Number: [id number] Date of Birth/Sex: Treating RN: Oct 23, 1927 (87 y.o. Gilman Buttner Primary Care Vivianna Piccini: Rich Number Other Clinician: Referring Faatimah Spielberg: Treating Liborio Saccente/Extender: [name] in Treatment: 4 Active Inactive HBO Nursing Diagnoses: Potential for barotraumas to ears, sinuses, teeth, and lungs or cerebral gas embolism related to changes in atmospheric pressure inside hyperbaric oxygen chamber Potential for oxygen toxicity seizures related to delivery of 100% oxygen at an increased atmospheric pressure Potential for pulmonary oxygen toxicity related to delivery of 100% oxygen at an increased atmospheric pressure Goals: Barotrauma will be prevented during HBO2 Date Initiated: 11/25/2020 Target Resolution Date: 12/26/2020 Goal Status: Active Patient will tolerate the hyperbaric oxygen therapy treatment Date Initiated: 11/25/2020 Target Resolution Date: 12/26/2020 Goal Status: Active Patient/caregiver will verbalize understanding of HBO goals, rationale, procedures and potential hazards Date Initiated: 11/25/2020 Target Resolution Date: 12/26/2020 Goal Status: Active Interventions: Administer decongestants, per physician orders, prior to HBO2 Assess and provide for patients comfort related to the hyperbaric environment and equalization of middle ear Assess patient for any history of confinement anxiety Notes: Electronic Signature(s) Signed: 12/11/2020 8:38:46 AM By: Leane Call Entered By: Leane Call on 12/09/2020 14:50:06 -------------------------------------------------------------------------------- Pain Assessment Details Patient Name: Date of Service: [name] [name] 12/09/2020 2:45 PM Medical Record Number: [id number] Patient Account Number: [id number] Date of Birth/Sex: Treating RN: Feb 19, 1928 (88 y.o. Gilman Buttner Primary Care Prakriti Carignan: Rich Number Other Clinician: Referring Missy Baksh: Treating  Khaliq Turay/Extender: [name] in Treatment: 4 Active Problems Location of Pain Severity and Description of Pain Patient Has Paino No Site Locations Rate the pain. Current Pain Level: 0 Pain Management and Medication Current Pain Management: Medication: No Cold Application: No Rest: No Massage: No Activity: No T.E.N.S.: No Heat Application: No Leg drop or elevation: No Is the Current Pain Management Adequate: Inadequate How does your wound impact your activities of daily livingo Sleep: No Bathing: No Appetite: No Relationship With Others: No Bladder Continence: No Emotions: No Bowel Continence: No Work: No Toileting: No Drive: No Dressing: No Hobbies: No Electronic Signature(s) Signed: 12/11/2020 8:38:46 AM By: Leane Call Entered By: Leane Call on 12/09/2020 14:48:39 -------------------------------------------------------------------------------- Patient/Caregiver Education Details Patient Name: Date of Service: [name] [name] 6/21/2022andnbsp2:45 PM Medical Record Number: [id number] Patient Account Number: [id number] Date of Birth/Gender: Treating RN: 1927/09/14 (87 y.o. Gilman Buttner Primary Care Physician: Rich Number Other Clinician: Referring Physician: Treating Physician/Extender: [name] in Treatment:  4 Education Assessment Education Provided To: Patient and Caregiver spouse Education Topics Provided Hyperbaric Oxygenation: Methods: Explain/Verbal Responses: Reinforcements needed Malignant/Atypical Wounds: Methods: Explain/Verbal Responses: Reinforcements needed Electronic Signature(s) Signed: 12/11/2020 8:38:46 AM By: Leane Call Entered By: Leane Call on 12/09/2020 14:50:51 -------------------------------------------------------------------------------- Wound Assessment Details Patient Name: Date of Service: [name] [name] 12/09/2020 2:45  PM Medical Record Number: [id number] Patient Account Number: [id number] Date of Birth/Sex: Treating RN: December 19, 1927 (87 y.o. Gilman Buttner Primary Care Wilmoth Rasnic: Rich Number Other Clinician: Referring Meeka Cartelli: Treating Chukwuebuka Churchill/Extender: [name] in Treatment: 4 Wound Status Wound Number: 1 Primary Soft Tissue Radionecrosis Etiology: Wound Location: Head - frontal Secondary Open Surgical Wound Wounding Event: Surgical Injury Etiology: Date Acquired: 12/06/2018 Wound Status: Open Weeks Of Treatment: 4 Comorbid Cataracts, Chronic Obstructive Pulmonary Disease (COPD), Clustered Wound: No History: Received Radiation Photos Wound Measurements Length: (cm) 6.4 Width: (cm) 4.5 Depth: (cm) 0.2 Area: (cm) 22.619 Volume: (cm) 4.524 % Reduction in Area: 4.8% % Reduction in Volume: 4.8% Epithelialization: None Tunneling: No Undermining: No Wound Description Classification: Full Thickness With Exposed Support Structures Wound Margin: Epibole Exudate Amount: Medium Exudate Type: Serous Exudate Color: amber Foul Odor After Cleansing: No Slough/Fibrino Yes Wound Bed Granulation Amount: Small (1-33%) Exposed Structure Granulation Quality: Pink Fascia Exposed: No Necrotic Amount: Large (67-100%) Fat Layer (Subcutaneous Tissue) Exposed: No Necrotic Quality: Adherent Slough Tendon Exposed: No Muscle Exposed: No Joint Exposed: No Bone Exposed: Yes Treatment Notes Wound #1 (Head - frontal) Cleanser Wound Cleanser Discharge Instruction: Cleanse the wound with wound cleanser prior to applying a clean dressing using gauze sponges, not tissue or cotton balls. Peri-Wound Care Topical Primary Dressing Fibracol Plus Dressing, 4x4.38 in (collagen) Discharge Instruction: Moisten collagen with saline or hydrogel Secondary Dressing Zetuvit Plus Silicone Border Dressing 4x4 (in/in) Discharge Instruction: Apply silicone border over primary  dressing as directed. Secured With Compression Wrap Compression Stockings Environmental education officer) Signed: 12/09/2020 4:19:49 PM By: Sandre Kitty Signed: 12/11/2020 8:38:46 AM By: Leane Call Entered By: Sandre Kitty on 12/09/2020 16:10:18 -------------------------------------------------------------------------------- Vitals Details Patient Name: Date of Service: [name], [name] 12/09/2020 2:45 PM Medical Record Number: [id number] Patient Account Number: [id number] Date of Birth/Sex: Treating RN: Oct 22, 1927 (87 y.o. Gilman Buttner Primary Care Shaunice Levitan: Rich Number Other Clinician: Referring Yordan Martindale: Treating Murtaza Shell/Extender: [name] in Treatment: 4 Vital Signs Time Taken: 14:40 Temperature (F): 98.1 Height (in): 69 Pulse (bpm): 86 Weight (lbs): 161 Respiratory Rate (breaths/min): 16 Body Mass Index (BMI): 23.8 Blood Pressure (mmHg): 188/74 Reference Range: 80 - 120 mg / dl Electronic Signature(s) Signed: 12/11/2020 8:38:46 AM By: Leane Call Entered By: Leane Call on 12/09/2020 14:48:26

## 2021-01-20 ENCOUNTER — Encounter (HOSPITAL_BASED_OUTPATIENT_CLINIC_OR_DEPARTMENT_OTHER): Payer: Medicare Other | Attending: Internal Medicine | Admitting: Internal Medicine

## 2021-01-20 ENCOUNTER — Other Ambulatory Visit: Payer: Self-pay

## 2021-01-20 DIAGNOSIS — S0100XD Unspecified open wound of scalp, subsequent encounter: Secondary | ICD-10-CM | POA: Diagnosis not present

## 2021-01-20 DIAGNOSIS — Z87891 Personal history of nicotine dependence: Secondary | ICD-10-CM | POA: Insufficient documentation

## 2021-01-20 DIAGNOSIS — M8668 Other chronic osteomyelitis, other site: Secondary | ICD-10-CM | POA: Diagnosis not present

## 2021-01-20 DIAGNOSIS — J449 Chronic obstructive pulmonary disease, unspecified: Secondary | ICD-10-CM

## 2021-01-20 DIAGNOSIS — W010XXA Fall on same level from slipping, tripping and stumbling without subsequent striking against object, initial encounter: Secondary | ICD-10-CM | POA: Insufficient documentation

## 2021-01-20 DIAGNOSIS — S41102A Unspecified open wound of left upper arm, initial encounter: Secondary | ICD-10-CM | POA: Diagnosis not present

## 2021-01-20 DIAGNOSIS — S0100XA Unspecified open wound of scalp, initial encounter: Secondary | ICD-10-CM | POA: Diagnosis present

## 2021-01-21 ENCOUNTER — Other Ambulatory Visit: Payer: Self-pay | Admitting: Pulmonary Disease

## 2021-01-21 DIAGNOSIS — J441 Chronic obstructive pulmonary disease with (acute) exacerbation: Secondary | ICD-10-CM

## 2021-02-03 ENCOUNTER — Encounter (HOSPITAL_BASED_OUTPATIENT_CLINIC_OR_DEPARTMENT_OTHER): Payer: Medicare Other | Admitting: Internal Medicine

## 2021-02-03 ENCOUNTER — Other Ambulatory Visit: Payer: Self-pay

## 2021-02-03 DIAGNOSIS — S0100XD Unspecified open wound of scalp, subsequent encounter: Secondary | ICD-10-CM

## 2021-02-03 DIAGNOSIS — S41102D Unspecified open wound of left upper arm, subsequent encounter: Secondary | ICD-10-CM

## 2021-02-03 DIAGNOSIS — S41102A Unspecified open wound of left upper arm, initial encounter: Secondary | ICD-10-CM | POA: Diagnosis not present

## 2021-02-03 DIAGNOSIS — M8668 Other chronic osteomyelitis, other site: Secondary | ICD-10-CM | POA: Diagnosis not present

## 2021-02-03 DIAGNOSIS — Z85828 Personal history of other malignant neoplasm of skin: Secondary | ICD-10-CM | POA: Diagnosis not present

## 2021-02-03 NOTE — Progress Notes (Signed)
RONAL, KALAL ([id number]) Visit Report for 02/03/2021 Chief Complaint Document Details Patient Name: Date of Service: [name] [name] 02/03/2021 9:30 A M Medical Record [name]: [id number] Patient Account [name]: [id number] Date of Birth/Sex: Treating RN: [birth year] (87 y.o. [name] Primary Care Provider: Rich [name] Other Clinician: Referring Provider: Treating Provider/Extender: [name] in Treatment: 12 Information Obtained from: Patient Chief Complaint Open wound With calvarium exposed 8/2 : left arm skin tear Electronic Signature(s) Signed: 02/03/2021 10:02:28 AM By: Kalman Shan DO Entered By: Kalman Shan on 02/03/2021 09:56:09 -------------------------------------------------------------------------------- HPI Details Patient Name: Date of Service: [name], [name] [name] 02/03/2021 9:30 A M Medical Record [name]: [id number] Patient Account [name]: [id number] Date of Birth/Sex: Treating RN: 05-12-1928 (87 y.o. 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 The presents today for evaluation of hyperbaric oxygen for exposed calvarium from a previously removed squamous cell carcinoma lesion. He is currently using hydrogel to the area and keeping it covered. He takes doxycycline for chronic suppression of chronic osteomyelitis. He currently denies any signs of infection. He had Mohs surgery to remove the squamous cell carcinoma in 2019. He subsequently had radiation treatment from 02/02/2018 - 03/07/2018 to the left forehead  with 5 5.00GY in 22 fractions using 6E electrons with a clinical set up technique. He had a nonhealing wound with exposed calvarium and And a full- thickness skin graft was attempted on 12/2018. on 07/13/2019 he was evaluated by Infectious disease for evaluation of purulent drainage. 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 He was given 6 weeks of IV antibiotics and is currently on lifelong doxycycline for suppression. 6/7; patient presents for 2-week follow-up. He has been using collagen with hydrogel to the wound bed. He reports no issues with this. He denies signs of acute infection. 6/21; patient presents for 2-week follow-up. He has been using collagen with hydrogel to the wound bed. He denies acute signs of infection. He completed his chest x-ray and echocardiogram for approval of hyperbaric oxygen. At this time he would like to complete the approval process however does not want to start HBO therapy at this time. His wife is concerned about his COPD being an issue for HBO therapy and they would like more time to think about it. 8/2; patient presents for follow-up. He has been using collagen occasionally with hydrogel to the exposed calvarium. He states that the collagen sticks and at times is hard to get off. Overall he is doing well and the wound is stable. He does not want to consider HBO. Earlier today patient slipped and fell and has a skin tear to his left arm. He currently has this covered. He denies systemic signs of infection. 8/16; patient presents for 2-week follow-up. He has been using antibiotic ointment to the left arm wound with improvement to wound healing. He denies pain or signs of infection. Scalp wound is stable. Electronic Signature(s) Signed: 02/03/2021 10:02:28 AM By: Kalman Shan DO Signed: 02/03/2021 10:02:28 AM By: Kalman Shan DO Entered By: Kalman Shan on 02/03/2021  09:57:15 -------------------------------------------------------------------------------- Physical Exam Details Patient Name: Date of Service: [name],  JA [name] 02/03/2021 9:30 A M Medical Record [name]: [id number] Patient Account [name]: [id number] Date of Birth/Sex: Treating RN: 11-06-1927 (87 y.o. [name] Primary Care Provider: Rich [name] Other Clinician: Referring Provider: Treating Provider/Extender: [name] in Treatment: 12 Constitutional respirations regular, non-labored and within target range for patient.Marland Kitchen Psychiatric pleasant and cooperative. Notes Exposed calvarium with yellow rigid surface. No obvious signs of infection Left upper extremity: T the forearm there is a skin tear with granulation tissue present. No obvious signs of infection. o Electronic Signature(s) Signed: 02/03/2021 10:02:28 AM By: Kalman Shan DO Entered By: Kalman Shan on 02/03/2021 09:58:27 -------------------------------------------------------------------------------- Physician Orders Details Patient Name: Date of Service: [name], [name] [name] 02/03/2021 9:30 A M Medical Record [name]: [id number] Patient Account [name]: [id number] Date of Birth/Sex: Treating RN: [birth year] (87 y.o. [name] Primary Care Provider: Rich [name] Other Clinician: Referring Provider: Treating Provider/Extender: [name] in Treatment: 12 Verbal / Phone Orders: No Diagnosis Coding ICD-10 Coding Code Description S01.00XD Unspecified open wound of scalp, subsequent encounter S41.102A Unspecified open wound of left upper arm, initial encounter J44.9 Chronic obstructive pulmonary disease, unspecified M86.68 Other chronic osteomyelitis, other site Z85.828 Personal history of other malignant neoplasm of skin Follow-up Appointments ppointment in: - 2 months with Dr. Heber [place] Return A Bathing/ Shower/ Hygiene May shower with  protection but do not get wound dressing(s) wet. Wound Treatment Wound #1 - Head - frontal Cleanser: Wound Cleanser (Generic) Every Other Day/Other:60 days Discharge Instructions: Cleanse the wound with wound cleanser prior to applying a clean dressing using gauze sponges, not tissue or cotton balls. Prim Dressing: Gentell Hydrogel Silver Saturated Gauze, 4x4 (in/in) (DME) (Generic) Every Other Day/Other:60 days ary Discharge Instructions: Apply to wound bed as directed Secondary Dressing: Zetuvit Plus Silicone Border Dressing 5x5 (in/in) (DME) (Generic) Every Other Day/Other:60 days Discharge Instructions: Apply silicone border over primary dressing as directed. Wound #2 - Forearm Wound Laterality: Left Cleanser: Wound Cleanser (Generic) 1 x Per Day/30 Days Discharge Instructions: Cleanse the wound with wound cleanser prior to applying a clean dressing using gauze sponges, not tissue or cotton balls. Topical: Bacitracin Ointment, 1 (oz) tube 1 x Per Day/30 Days Secondary Dressing: T Non-adherent Dressing, 2x3 in (Generic) 1 x Per Day/30 Days elfa Discharge Instructions: Apply over primary dressing as directed. Secured With: Child psychotherapist, Sterile 2x75 (in/in) (Generic) 1 x Per Day/30 Days Discharge Instructions: Secure with stretch gauze as directed. Secured With: 33M Medipore H Soft Cloth Surgical Tape, 2x2 (in/yd) (Generic) 1 x Per Day/30 Days Discharge Instructions: Secure dressing with tape as directed. Electronic Signature(s) Signed: 02/03/2021 10:02:28 AM By: Kalman Shan DO Entered By: Kalman Shan on 02/03/2021 09:58:40 -------------------------------------------------------------------------------- Problem List Details Patient Name: Date of Service: [name], [name] [name] 02/03/2021 9:30 A M Medical Record [name]: [id number] Patient Account [name]: [id number] Date of Birth/Sex: Treating RN: [birth year] (87 y.o. [name] Primary Care Provider:  Rich [name] Other Clinician: Referring Provider: Treating Provider/Extender: [name] in Treatment: 12 Active Problems ICD-10 Encounter Code Description Active Date MDM Diagnosis S01.00XD Unspecified open wound of scalp, subsequent encounter 11/20/2020 No Yes S41.102D Unspecified open wound of left upper arm, subsequent encounter 02/03/2021 No Yes J44.9 Chronic obstructive pulmonary disease, unspecified 11/20/2020 No Yes M86.68 Other chronic osteomyelitis, other site 11/20/2020 No Yes Z85.828 Personal history of other malignant neoplasm of skin 11/20/2020 No Yes Inactive Problems ICD-10 Code Description Active Date Inactive Date S41.102A Unspecified open wound of  left upper arm, initial encounter 01/20/2021 01/20/2021 Resolved Problems Electronic Signature(s) Signed: 02/03/2021 10:02:28 AM By: Kalman Shan DO Entered By: Kalman Shan on 02/03/2021 09:51:59 -------------------------------------------------------------------------------- Progress Note Details Patient Name: Date of Service: [name], [name] [name] 02/03/2021 9:30 A M Medical Record [name]: [id number] Patient Account [name]: [id number] Date of Birth/Sex: Treating RN: 1927-06-29 (87 y.o. 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 The presents today for evaluation of hyperbaric oxygen for exposed calvarium from a previously removed squamous cell carcinoma lesion. He is  currently using hydrogel to the area and keeping it covered. He takes doxycycline for chronic suppression of chronic osteomyelitis. He currently denies any signs of infection. He had Mohs surgery to remove the squamous cell carcinoma in 2019. He subsequently had radiation treatment from 02/02/2018 - 03/07/2018 to the left forehead with 5 5.00GY in 22 fractions using 6E electrons with a clinical set up technique. He had a nonhealing wound with exposed calvarium and And a full- thickness skin graft was attempted on 12/2018. on 07/13/2019 he was evaluated by Infectious disease for evaluation of purulent drainage. 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 He was given 6 weeks of IV antibiotics and is currently on lifelong doxycycline for suppression. 6/7; patient presents for 2-week follow-up. He has been using collagen with hydrogel to the wound bed. He reports no issues with this. He denies signs of acute infection. 6/21; patient presents for 2-week follow-up. He has been using collagen with hydrogel to the wound bed. He denies acute signs of infection. He completed his chest x-ray and echocardiogram for approval of hyperbaric oxygen. At this time he would like to complete the approval process however does not want to start HBO therapy at this time. His wife is concerned about his COPD being an issue for HBO therapy and they would like more time to think about it. 8/2; patient presents for follow-up. He has been using collagen occasionally with hydrogel to the exposed calvarium. He states that the collagen sticks and at times is hard to get off. Overall he is doing well and the wound is stable. He does not want to consider HBO. Earlier today patient slipped and fell and has a skin tear to his left arm. He currently has this covered. He denies systemic signs of infection. 8/16; patient presents for 2-week follow-up. He has been using antibiotic ointment to the  left arm wound with improvement to wound healing. He denies pain or signs of infection. Scalp wound is stable. Patient History Information obtained from Patient. Family History Cancer - Father, Heart Disease - Mother, No family history of Diabetes, Hereditary Spherocytosis, Hypertension, Kidney Disease, Lung Disease, Seizures, Stroke, Thyroid Problems, Tuberculosis. Social History Former smoker, Marital Status - Married, Alcohol Use - Rarely, Drug Use - No History, Caffeine Use - Moderate. Medical History Eyes Patient has history of Cataracts - bil removed Denies history of Glaucoma, Optic Neuritis Ear/Nose/Mouth/Throat Denies history of Chronic sinus problems/congestion, Middle ear problems Respiratory Patient has history of Chronic Obstructive Pulmonary Disease (COPD)  Denies history of Aspiration, Asthma, Pneumothorax, Sleep Apnea, Tuberculosis Gastrointestinal Denies history of Cirrhosis , Colitis, Crohnoos, Hepatitis A, Hepatitis B, Hepatitis C Endocrine Denies history of Type I Diabetes, Type II Diabetes Integumentary (Skin) Denies history of History of Burn Musculoskeletal Denies history of Gout, Rheumatoid Arthritis, Osteoarthritis, Osteomyelitis Oncologic Patient has history of Received Radiation - 20 treatments Denies history of Received Chemotherapy Psychiatric Denies history of Anorexia/bulimia, Confinement Anxiety Hospitalization/Surgery History - Moh's procedure to scalp. - tracheotomy as child. - bilateral cataracts removed. - left hip replacement. Medical A Surgical History Notes nd Endocrine hypothyroidism Oncologic sqamous cell carcinoma of the scalp Objective Constitutional respirations regular, non-labored and within target range for patient.. Vitals Time Taken: 9:25 AM, Height: 69 in, Weight: 161 lbs, BMI: 23.8, Temperature: 97.8 F, Pulse: 73 bpm, Respiratory Rate: 18 breaths/min, Blood Pressure: 163/71 mmHg. Psychiatric pleasant and  cooperative. General Notes: Exposed calvarium with yellow rigid surface. No obvious signs of infection Left upper extremity: T the forearm there is a skin tear with o granulation tissue present. No obvious signs of infection. Integumentary (Hair, Skin) Wound #1 status is Open. Original cause of wound was Surgical Injury. The date acquired was: 12/06/2018. The wound has been in treatment 12 weeks. The wound is located on the Head - frontal. The wound measures 6.3cm length x 4.6cm width x 0.2cm depth; 22.761cm^2 area and 4.552cm^3 volume. There is bone and Fat Layer (Subcutaneous Tissue) exposed. There is no tunneling or undermining noted. There is a medium amount of serous drainage noted. The wound margin is distinct with the outline attached to the wound base. There is small (1-33%) pink, pale granulation within the wound bed. There is a large (67- 100%) amount of necrotic tissue within the wound bed including Adherent Slough. Wound #2 status is Open. Original cause of wound was Trauma. The date acquired was: 01/20/2021. The wound has been in treatment 2 weeks. The wound is located on the Left Forearm. The wound measures 2.7cm length x 2.5cm width x 0.1cm depth; 5.301cm^2 area and 0.53cm^3 volume. There is Fat Layer (Subcutaneous Tissue) exposed. There is no tunneling or undermining noted. There is a medium amount of serosanguineous drainage noted. The wound margin is distinct with the outline attached to the wound base. There is large (67-100%) red, pink granulation within the wound bed. There is no necrotic tissue within the wound bed. Assessment Active Problems ICD-10 Unspecified open wound of scalp, subsequent encounter Unspecified open wound of left upper arm, subsequent encounter Chronic obstructive pulmonary disease, unspecified Other chronic osteomyelitis, other site Personal history of other malignant neoplasm of skin Patient's left upper extremity wound is healing nicely. I recommended  continuing antibiotic ointment daily to this area. It should close up in the next 1 to 2 weeks. He actually developed this wound on his day of follow-up for his scalp wound. Since the wound is almost closed I do not think he needs to follow-up with [place] closely for this. Patient follows in our clinic for supplies for his scalp wound. He has chronic osteomyelitis to the exposed calvarium. He has declined HBO therapy. At this time we will follow him to assure this does not get worse and for reordering supplies. Patient understands this area will not heal With conservative management of hydrogel. Plan Follow-up Appointments: Return Appointment in: - 2 months with Dr. He[facility] Bathing/ Shower/ Hygiene: May shower with protection but do not get wound dressing(s) wet. WOUND #1: - Head - frontal Wound Laterality: Cleanser: Wound Cleanser (Generic) Every Other  Day/Other:60 days Discharge Instructions: Cleanse the wound with wound cleanser prior to applying a clean dressing using gauze sponges, not tissue or cotton balls. Prim Dressing: Gentell Hydrogel Silver Saturated Gauze, 4x4 (in/in) (DME) (Generic) Every Other Day/Other:60 days ary Discharge Instructions: Apply to wound bed as directed Secondary Dressing: Zetuvit Plus Silicone Border Dressing 5x5 (in/in) (DME) (Generic) Every Other Day/Other:60 days Discharge Instructions: Apply silicone border over primary dressing as directed. WOUND #2: - Forearm Wound Laterality: Left Cleanser: Wound Cleanser (Generic) 1 x Per Day/30 Days Discharge Instructions: Cleanse the wound with wound cleanser prior to applying a clean dressing using gauze sponges, not tissue or cotton balls. Topical: Bacitracin Ointment, 1 (oz) tube 1 x Per Day/30 Days Secondary Dressing: T Non-adherent Dressing, 2x3 in (Generic) 1 x Per Day/30 Days elfa Discharge Instructions: Apply over primary dressing as directed. Secured With: Child psychotherapist, Sterile 2x75 (in/in)  (Generic) 1 x Per Day/30 Days Discharge Instructions: Secure with stretch gauze as directed. Secured With: 80M Medipore H Soft Cloth Surgical T ape, 2x2 (in/yd) (Generic) 1 x Per Day/30 Days Discharge Instructions: Secure dressing with tape as directed. 1. Continue antibiotic ointment to the left arm wound. Patient knows to call with any questions or concerns. 2. Reorder supplies for scalp wound including hydrogel and foam border dressings 3. Follow-up in 2 months Electronic Signature(s) Signed: 02/03/2021 10:02:28 AM By: Kalman Shan DO Entered By: Kalman Shan on 02/03/2021 10:01:45 -------------------------------------------------------------------------------- HxROS Details Patient Name: Date of Service: [name], JA [name] 02/03/2021 9:30 A M Medical Record [name]: [id number] Patient Account [name]: [id number] Date of Birth/Sex: Treating RN: 11/15/1927 (87 y.o. [name] Primary Care Provider: Rich [name] Other Clinician: Referring Provider: Treating Provider/Extender: [name] in Treatment: 12 Information Obtained From Patient Eyes Medical History: Positive for: Cataracts - bil removed Negative for: Glaucoma; Optic Neuritis Ear/Nose/Mouth/Throat Medical History: Negative for: Chronic sinus problems/congestion; Middle ear problems Respiratory Medical History: Positive for: Chronic Obstructive Pulmonary Disease (COPD) Negative for: Aspiration; Asthma; Pneumothorax; Sleep Apnea; Tuberculosis Gastrointestinal Medical History: Negative for: Cirrhosis ; Colitis; Crohns; Hepatitis A; Hepatitis B; Hepatitis C Endocrine Medical History: Negative for: Type I Diabetes; Type II Diabetes Past Medical History Notes: hypothyroidism Integumentary (Skin) Medical History: Negative for: History of Burn Musculoskeletal Medical History: Negative for: Gout; Rheumatoid Arthritis; Osteoarthritis; Osteomyelitis Oncologic Medical  History: Positive for: Received Radiation - 20 treatments Negative for: Received Chemotherapy Past Medical History Notes: sqamous cell carcinoma of the scalp Psychiatric Medical History: Negative for: Anorexia/bulimia; Confinement Anxiety HBO Extended History Items Eyes: Cataracts Immunizations Pneumococcal Vaccine: Received Pneumococcal Vaccination: No Implantable Devices No devices added Hospitalization / Surgery History Type of Hospitalization/Surgery Moh's procedure to scalp tracheotomy as child bilateral cataracts removed left hip replacement Family and Social History Cancer: Yes - Father; Diabetes: No; Heart Disease: Yes - Mother; Hereditary Spherocytosis: No; Hypertension: No; Kidney Disease: No; Lung Disease: No; Seizures: No; Stroke: No; Thyroid Problems: No; Tuberculosis: No; Former smoker; Marital Status - Married; Alcohol Use: Rarely; Drug Use: No History; Caffeine Use: Moderate; Financial Concerns: No; Food, Clothing or Shelter Needs: No; Support System Lacking: No; Transportation Concerns: No Electronic Signature(s) Signed: 02/03/2021 10:02:28 AM By: Kalman Shan DO Signed: 02/03/2021 5:48:55 PM By: Levan Hurst RN, BSN Entered By: Kalman Shan on 02/03/2021 09:57:22 -------------------------------------------------------------------------------- [place] Details Patient Name: Date of Service: [name], [name] [name] 02/03/2021 Medical Record [name]: [id number] Patient Account [name]: [id number] Date of Birth/Sex: Treating RN: December 17, 1927 (87 y.o. [name] Primary Care Provider: Rich [name]  Other Clinician: Referring Provider: Treating Provider/Extender: [name] in Treatment: 12 Diagnosis Coding ICD-10 Codes Code Description S01.00XD Unspecified open wound of scalp, subsequent encounter S41.102D Unspecified open wound of left upper arm, subsequent encounter J44.9 Chronic obstructive pulmonary disease,  unspecified M86.68 Other chronic osteomyelitis, other site Z85.828 Personal history of other malignant neoplasm of skin Facility Procedures CPT4 Code: [id number] Description: 99213 - WOUND CARE VISIT-LEV 3 EST PT Modifier: Quantity: 1 Physician Procedures : CPT4 Code Description Modifier [id number] - WC PHYS LEVEL 3 - EST PT ICD-10 Diagnosis Description S01.00XD Unspecified open wound of scalp, subsequent encounter S41.102D Unspecified open wound of left upper arm, subsequent encounter M86.68 Other  chronic osteomyelitis, other site Z85.828 Personal history of other malignant neoplasm of skin Quantity: 1 Electronic Signature(s) Signed: 02/03/2021 11:30:51 AM By: Kalman Shan DO Signed: 02/03/2021 5:48:55 PM By: Levan Hurst RN, BSN Previous Signature: 02/03/2021 10:02:28 AM Version By: Kalman Shan DO Entered By: Levan Hurst on 02/03/2021 10:02:47

## 2021-02-03 NOTE — Progress Notes (Addendum)
SIDAK, GERE ([id number]) Visit Report for 02/03/2021 Arrival Information Details Patient Name: Date of Service: [name] [name] 02/03/2021 9:30 A M Medical Record Number: [id number] Patient Account Number: [id number] Date of Birth/Sex: Treating RN: 1928/06/01 (87 y.o. [name] Primary Care Laquitta Dominski: [name] Other Clinician: Referring Royer Cristobal: Treating Naiyana Barbian/Extender: [name] in Treatment: 12 Visit Information History Since Last Visit Added or deleted any medications: No Patient Arrived: Ambulatory Any new allergies or adverse reactions: No Arrival Time: 09:25 Had a fall or experienced change in No Accompanied By: alone activities of daily living that may affect Transfer Assistance: None risk of falls: Patient Identification Verified: Yes Signs or symptoms of abuse/neglect since last visito No Secondary Verification Process Completed: Yes Hospitalized since last visit: No Patient Requires Transmission-Based Precautions: No Implantable device outside of the clinic excluding No Patient Has Alerts: No cellular tissue based products placed in the center since last visit: Has Dressing in Place as Prescribed: Yes Pain Present Now: No Electronic Signature(s) Signed: 02/03/2021 5:48:55 PM By: Levan Hurst RN, BSN Entered By: Levan Hurst on 02/03/2021 09:27:31 -------------------------------------------------------------------------------- Clinic Level of Care Assessment Details Patient Name: Date of Service: [name], [name] 02/03/2021 9:30 A M Medical Record Number: [id number] Patient Account Number: [id number] Date of Birth/Sex: Treating RN: 02/08/1928 (87 y.o. [name] Primary Care Afnan Cadiente: [name] Other Clinician: Referring Seth Friedlander: Treating Derik Fults/Extender: [name] in Treatment: 12 Clinic Level of Care Assessment Items TOOL 4 Quantity Score X- 1 0 Use when  only an EandM is performed on FOLLOW-UP visit ASSESSMENTS - Nursing Assessment / Reassessment X- 1 10 Reassessment of Co-morbidities (includes updates in patient status) X- 1 5 Reassessment of Adherence to Treatment Plan ASSESSMENTS - Wound and Skin A ssessment / Reassessment '[]'$  - 0 Simple Wound Assessment / Reassessment - one wound X- 2 5 Complex Wound Assessment / Reassessment - multiple wounds '[]'$  - 0 Dermatologic / Skin Assessment (not related to wound area) ASSESSMENTS - Focused Assessment '[]'$  - 0 Circumferential Edema Measurements - multi extremities '[]'$  - 0 Nutritional Assessment / Counseling / Intervention '[]'$  - 0 Lower Extremity Assessment (monofilament, tuning fork, pulses) '[]'$  - 0 Peripheral Arterial Disease Assessment (using hand held doppler) ASSESSMENTS - Ostomy and/or Continence Assessment and Care '[]'$  - 0 Incontinence Assessment and Management '[]'$  - 0 Ostomy Care Assessment and Management (repouching, etc.) PROCESS - Coordination of Care X - Simple Patient / Family Education for ongoing care 1 15 '[]'$  - 0 Complex (extensive) Patient / Family Education for ongoing care X- 1 10 Staff obtains Programmer, systems, Records, T Results / Process Orders est '[]'$  - 0 Staff telephones HHA, Nursing Homes / Clarify orders / etc '[]'$  - 0 Routine Transfer to another Facility (non-emergent condition) '[]'$  - 0 Routine Hospital Admission (non-emergent condition) '[]'$  - 0 New Admissions / Biomedical engineer / Ordering NPWT Apligraf, etc. , '[]'$  - 0 Emergency Hospital Admission (emergent condition) X- 1 10 Simple Discharge Coordination '[]'$  - 0 Complex (extensive) Discharge Coordination PROCESS - Special Needs '[]'$  - 0 Pediatric / Minor Patient Management '[]'$  - 0 Isolation Patient Management '[]'$  - 0 Hearing / Language / Visual special needs '[]'$  - 0 Assessment of Community assistance (transportation, D/C planning, etc.) '[]'$  - 0 Additional assistance / Altered mentation '[]'$  - 0 Support  Surface(s) Assessment (bed, cushion, seat, etc.) INTERVENTIONS - Wound Cleansing / Measurement '[]'$  - 0 Simple Wound Cleansing - one wound X- 2 5 Complex Wound Cleansing -  multiple wounds X- 1 5 Wound Imaging (photographs - any number of wounds) '[]'$  - 0 Wound Tracing (instead of photographs) '[]'$  - 0 Simple Wound Measurement - one wound X- 2 5 Complex Wound Measurement - multiple wounds INTERVENTIONS - Wound Dressings X - Small Wound Dressing one or multiple wounds 2 10 '[]'$  - 0 Medium Wound Dressing one or multiple wounds '[]'$  - 0 Large Wound Dressing one or multiple wounds '[]'$  - 0 Application of Medications - topical '[]'$  - 0 Application of Medications - injection INTERVENTIONS - Miscellaneous '[]'$  - 0 External ear exam '[]'$  - 0 Specimen Collection (cultures, biopsies, blood, body fluids, etc.) '[]'$  - 0 Specimen(s) / Culture(s) sent or taken to Lab for analysis '[]'$  - 0 Patient Transfer (multiple staff / Civil Service fast streamer / Similar devices) '[]'$  - 0 Simple Staple / Suture removal (25 or less) '[]'$  - 0 Complex Staple / Suture removal (26 or more) '[]'$  - 0 Hypo / Hyperglycemic Management (close monitor of Blood Glucose) '[]'$  - 0 Ankle / Brachial Index (ABI) - do not check if billed separately X- 1 5 Vital Signs Has the patient been seen at the hospital within the last three years: Yes Total Score: 110 Level Of Care: New/Established - Level 3 Electronic Signature(s) Signed: 02/03/2021 5:48:55 PM By: Levan Hurst RN, BSN Entered By: Levan Hurst on 02/03/2021 10:02:18 -------------------------------------------------------------------------------- Complex / Palliative Patient Assessment Details Patient Name: Date of Service: [name], [name] 02/03/2021 9:30 A M Medical Record Number: [id number] Patient Account Number: [id number] Date of Birth/Sex: Treating RN: 1928-02-06 (87 y.o. [name] Primary Care Shylin Keizer: [name] Other Clinician: Referring Tod Abrahamsen: Treating  Ellanie Oppedisano/Extender: [name] in Treatment: 12 Palliative Management Criteria Complex Wound Management Criteria The patient, the patient's family or care Kyann Heydt(s), or primary care physician has requested supportive care rather than advanced wound treatment. (Must be documented in physician progress notes.) Care Approach Wound Care Plan: Complex Wound Management Electronic Signature(s) Signed: 03/02/2021 12:28:40 PM By: Kalman Shan DO Signed: 03/19/2021 4:50:18 PM By: Levan Hurst RN, BSN Entered By: Levan Hurst on 02/28/2021 11:54:25 -------------------------------------------------------------------------------- Encounter Discharge Information Details Patient Name: Date of Service: [name], [name] 02/03/2021 9:30 A M Medical Record Number: [id number] Patient Account Number: [id number] Date of Birth/Sex: Treating RN: 05/29/1928 (87 y.o. [name] Primary Care Houa Ackert: [name] Other Clinician: Referring Querida Beretta: Treating Christabel Camire/Extender: [name] in Treatment: 12 Encounter Discharge Information Items Discharge Condition: Stable Ambulatory Status: Ambulatory Discharge Destination: Home Transportation: Private Auto Accompanied By: alone Schedule Follow-up Appointment: Yes Clinical Summary of Care: Patient Declined Electronic Signature(s) Signed: 02/03/2021 5:48:55 PM By: Levan Hurst RN, BSN Entered By: Levan Hurst on 02/03/2021 10:03:35 -------------------------------------------------------------------------------- Multi Wound Chart Details Patient Name: Date of Service: [name], [name] 02/03/2021 9:30 A M Medical Record Number: [id number] Patient Account Number: [id number] Date of Birth/Sex: Treating RN: 02-23-1928 (87 y.o. [name] Primary Care Walida Cajas: [name] Other Clinician: Referring Deanza Upperman: Treating Maren Wiesen/Extender: [name] in Treatment: 12 Vital Signs Height(in): 9 Pulse(bpm): 29 Weight(lbs): 161 Blood Pressure(mmHg): 163/71 Body Mass Index(BMI): 24 Temperature(F): 97.8 Respiratory Rate(breaths/min): 18 Photos: [1:No Photos Head - frontal] [2:No Photos Left Forearm] [N/A:N/A N/A] Wound Location: [1:Surgical Injury] [2:Trauma] [N/A:N/A] Wounding Event: [1:Soft Tissue Radionecrosis] [2:Skin T ear] [N/A:N/A] Primary Etiology: [1:Open Surgical Wound] [2:N/A] [N/A:N/A] Secondary Etiology: [1:Cataracts, Chronic Obstructive] [2:Cataracts, Chronic Obstructive] [N/A:N/A] Comorbid History: [1:Pulmonary Disease (COPD), ReceivedPulmonary Disease (COPD), Received Radiation 12/06/2018] [2:Radiation 01/20/2021] [N/A:N/A]  Date Acquired: [1:12] [2:2] [N/A:N/A] Weeks of Treatment: [1:Open] [2:Open] [N/A:N/A] Wound Status: [1:6.3x4.6x0.2] [2:2.7x2.5x0.1] [N/A:N/A] Measurements L x W x D (cm) [1:22.761] [2:5.301] [N/A:N/A] A (cm) : rea [1:4.552] [2:0.53] [N/A:N/A] Volume (cm) : [1:4.20%] [2:39.20%] [N/A:N/A] % Reduction in Area: [1:4.20%] [2:39.20%] [N/A:N/A] % Reduction in Volume: [1:Full Thickness With Exposed Support Full Thickness Without Exposed] [N/A:N/A] Classification: [1:Structures Medium] [2:Support Structures Medium] [N/A:N/A] Exudate Amount: [1:Serous] [2:Serosanguineous] [N/A:N/A] Exudate Type: [1:amber] [2:red, brown] [N/A:N/A] Exudate Color: [1:Distinct, outline attached] [2:Distinct, outline attached] [N/A:N/A] Wound Margin: [1:Small (1-33%)] [2:Large (67-100%)] [N/A:N/A] Granulation Amount: [1:Pink, Pale] [2:Red, Pink] [N/A:N/A] Granulation Quality: [1:Large (67-100%)] [2:None Present (0%)] [N/A:N/A] Necrotic Amount: [1:Fat Layer (Subcutaneous Tissue): Yes Fat Layer (Subcutaneous Tissue): Yes N/A] Exposed Structures: [1:Bone: Yes Fascia: No Tendon: No Muscle: No Joint: No Small (1-33%)] [2:Fascia: No Tendon: No Muscle: No Joint: No Bone: No Medium (34-66%)] [N/A:N/A] Treatment  Notes Electronic Signature(s) Signed: 02/03/2021 10:02:28 AM By: Kalman Shan DO Signed: 02/03/2021 5:48:55 PM By: Levan Hurst RN, BSN Entered By: Kalman Shan on 02/03/2021 09:55:24 -------------------------------------------------------------------------------- Multi-Disciplinary Care Plan Details Patient Name: Date of Service: [name], [name] 02/03/2021 9:30 A M Medical Record Number: [id number] Patient Account Number: [id number] Date of Birth/Sex: Treating RN: Mar 17, 1928 (87 y.o. [name] Primary Care Ottis Sarnowski: Other Clinician: [name] Referring Lyzbeth Genrich: Treating Cam Dauphin/Extender: [name] in Treatment: 12 Active Inactive Wound/Skin Impairment Nursing Diagnoses: Impaired tissue integrity Goals: Patient/caregiver will verbalize understanding of skin care regimen Date Initiated: 02/03/2021 Target Resolution Date: 03/06/2021 Goal Status: Active Interventions: Assess patient/caregiver ability to obtain necessary supplies Assess patient/caregiver ability to perform ulcer/skin care regimen upon admission and as needed Assess ulceration(s) every visit Provide education on ulcer and skin care Notes: Electronic Signature(s) Signed: 02/03/2021 5:48:55 PM By: Levan Hurst RN, BSN Entered By: Levan Hurst on 02/03/2021 09:36:09 -------------------------------------------------------------------------------- Pain Assessment Details Patient Name: Date of Service: [name], [name] 02/03/2021 9:30 A M Medical Record Number: [id number] Patient Account Number: [id number] Date of Birth/Sex: Treating RN: [birth year] (87 y.o. [name] Primary Care Allicia Culley: [name] Other Clinician: Referring Cambry Spampinato: Treating Radonna Bracher/Extender: [name] in Treatment: 12 Active Problems Location of Pain Severity and Description of Pain Patient Has Paino No Site Locations Pain Management  and Medication Current Pain Management: Electronic Signature(s) Signed: 02/03/2021 5:48:55 PM By: Levan Hurst RN, BSN Entered By: Levan Hurst on 02/03/2021 09:27:51 -------------------------------------------------------------------------------- Patient/Caregiver Education Details Patient Name: Date of Service: [name] [name] 8/16/2022andnbsp9:30 A M Medical Record Number: [id number] Patient Account Number: [id number] Date of Birth/Gender: Treating RN: 08/02/1927 (87 y.o. [name] Primary Care Physician: [name] Other Clinician: Referring Physician: Treating Physician/Extender: [name] in Treatment: 12 Education Assessment Education Provided To: Patient Education Topics Provided Wound/Skin Impairment: Methods: Explain/Verbal Responses: State content correctly [employer]) Signed: 02/03/2021 5:48:55 PM By: Levan Hurst RN, BSN Entered By: Levan Hurst on 02/03/2021 09:36:35 -------------------------------------------------------------------------------- Wound Assessment Details Patient Name: Date of Service: [name], [name] 02/03/2021 9:30 A M Medical Record Number: [id number] Patient Account Number: [id number] Date of Birth/Sex: Treating RN: Apr 16, 1928 (87 y.o. 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 Secondary Open Surgical Wound Wounding Event: Surgical Injury Etiology: Date Acquired: 12/06/2018 Wound Status: Open Weeks Of Treatment: 12 Comorbid Cataracts, Chronic Obstructive Pulmonary Disease (COPD), Clustered  Wound: No History: Received Radiation Photos Photo Uploaded By: Donavan Burnet on 02/03/2021 16:56:53 Wound Measurements Length: (cm)  6.3 Width: (cm) 4.6 Depth: (cm) 0.2 Area: (cm) 22.761 Volume: (cm) 4.552 % Reduction in Area: 4.2% % Reduction in Volume: 4.2% Epithelialization: Small (1-33%) Tunneling: No Undermining: No Wound Description Classification: Full Thickness With Exposed Support Structures Wound Margin: Distinct, outline attached Exudate Amount: Medium Exudate Type: Serous Exudate Color: amber Foul Odor After Cleansing: No Slough/Fibrino Yes Wound Bed Granulation Amount: Small (1-33%) Exposed Structure Granulation Quality: Pink, Pale Fascia Exposed: No Necrotic Amount: Large (67-100%) Fat Layer (Subcutaneous Tissue) Exposed: Yes Necrotic Quality: Adherent Slough Tendon Exposed: No Muscle Exposed: No Joint Exposed: No Bone Exposed: Yes Treatment Notes Wound #1 (Head - frontal) Cleanser Wound Cleanser Discharge Instruction: Cleanse the wound with wound cleanser prior to applying a clean dressing using gauze sponges, not tissue or cotton balls. Peri-Wound Care Topical Primary Dressing Gentell Hydrogel Silver Saturated Gauze, 4x4 (in/in) Discharge Instruction: Apply to wound bed as directed Secondary Dressing Zetuvit Plus Silicone Border Dressing 5x5 (in/in) Discharge Instruction: Apply silicone border over primary dressing as directed. Secured With Compression Wrap Compression Stockings Environmental education officer) Signed: 02/03/2021 5:48:55 PM By: Levan Hurst RN, BSN Entered By: Levan Hurst on 02/03/2021 09:34:01 -------------------------------------------------------------------------------- Wound Assessment Details Patient Name: Date of Service: [name], [name] 02/03/2021 9:30 A M Medical Record Number: [id number] Patient Account Number: [id number] Date of Birth/Sex: Treating RN: 1927-08-05 (87 y.o. [name] Primary Care Ellin Fitzgibbons: [name] Other Clinician: Referring Srihari Shellhammer: Treating Verbie Babic/Extender: [name] in  Treatment: 12 Wound Status Wound Number: 2 Primary Skin Tear Etiology: Wound Location: Left Forearm Wound Open Wounding Event: Trauma Status: Date Acquired: 01/20/2021 Comorbid Cataracts, Chronic Obstructive Pulmonary Disease (COPD), Weeks Of Treatment: 2 History: Received Radiation Clustered Wound: No Photos Photo Uploaded By: Donavan Burnet on 02/03/2021 16:56:53 Wound Measurements Length: (cm) 2.7 Width: (cm) 2.5 Depth: (cm) 0.1 Area: (cm) 5.301 Volume: (cm) 0.53 % Reduction in Area: 39.2% % Reduction in Volume: 39.2% Epithelialization: Medium (34-66%) Tunneling: No Undermining: No Wound Description Classification: Full Thickness Without Exposed Support Structures Wound Margin: Distinct, outline attached Exudate Amount: Medium Exudate Type: Serosanguineous Exudate Color: red, brown Foul Odor After Cleansing: No Slough/Fibrino No Wound Bed Granulation Amount: Large (67-100%) Exposed Structure Granulation Quality: Red, Pink Fascia Exposed: No Necrotic Amount: None Present (0%) Fat Layer (Subcutaneous Tissue) Exposed: Yes Tendon Exposed: No Muscle Exposed: No Joint Exposed: No Bone Exposed: No Treatment Notes Wound #2 (Forearm) Wound Laterality: Left Cleanser Wound Cleanser Discharge Instruction: Cleanse the wound with wound cleanser prior to applying a clean dressing using gauze sponges, not tissue or cotton balls. Peri-Wound Care Topical Bacitracin Ointment, 1 (oz) tube Primary Dressing Secondary Dressing T Non-adherent Dressing, 2x3 in elfa Discharge Instruction: Apply over primary dressing as directed. Secured With Conforming Stretch Gauze Bandage, Sterile 2x75 (in/in) Discharge Instruction: Secure with stretch gauze as directed. 71M Medipore H Soft Cloth Surgical T ape, 2x2 (in/yd) Discharge Instruction: Secure dressing with tape as directed. Compression Wrap Compression Stockings Add-Ons Electronic Signature(s) Signed: 02/03/2021 5:48:55 PM By:  Levan Hurst RN, BSN Entered By: Levan Hurst on 02/03/2021 09:34:28 -------------------------------------------------------------------------------- Vitals Details Patient Name: Date of Service: [name], [name] 02/03/2021 9:30 A M Medical Record Number: [id number] Patient Account Number: [id number] Date of Birth/Sex: Treating RN: 1928/06/02 (87 y.o. [name] Primary Care Quintel Mccalla: [name] Other Clinician: Referring Namiah Dunnavant: Treating Yanira Tolsma/Extender: [name] in Treatment: 12 Vital Signs Time  Taken: 09:25 Temperature (F): 97.8 Height (in): 69 Pulse (bpm): 73 Weight (lbs): 161 Respiratory Rate (breaths/min): 18 Body Mass Index (BMI): 23.8 Blood Pressure (mmHg): 163/71 Reference Range: 80 - 120 mg / dl Electronic Signature(s) Signed: 02/03/2021 5:48:55 PM By: Levan Hurst RN, BSN Entered By: Levan Hurst on 02/03/2021 09:27:47

## 2021-03-03 ENCOUNTER — Other Ambulatory Visit: Payer: Self-pay | Admitting: Pulmonary Disease

## 2021-03-03 DIAGNOSIS — J441 Chronic obstructive pulmonary disease with (acute) exacerbation: Secondary | ICD-10-CM

## 2021-03-03 NOTE — Progress Notes (Signed)
[name] ([id number]) Visit Report for 01/20/2021 Arrival Information Details Patient Name: Date of Service: [name] [name] 01/20/2021 9:30 A M Medical Record Number: [id number] Patient Account Number: [id number] Date of Birth/Sex: Treating RN: [birth year] (87 y.o. [name], Sean Primary Care [name]: [name] Number Other Clinician: Referring [name]: Treating [name]/Extender: [name] in Treatment: 10 Visit Information History Since Last Visit Added or deleted any medications: No Patient Arrived: Ambulatory Any new allergies or adverse reactions: No Arrival Time: 09:25 Had a fall or experienced change in Yes Accompanied By: self activities of daily living that may affect Transfer Assistance: None risk of falls: Patient Identification Verified: Yes Signs or symptoms of abuse/neglect since last visito No Secondary Verification Process Completed: Yes Hospitalized since last visit: No Patient Requires Transmission-Based Precautions: No Implantable device outside of the clinic excluding No Patient Has Alerts: No cellular tissue based products placed in the center since last visit: Has Dressing in Place as Prescribed: Yes Pain Present Now: No Notes Patient stated he had a fall this morning before his appointment he was helping his wife in the garden and hurt his arm. Electronic Signature(s) Signed: 02/03/2021 11:33:27 AM By: [name] Entered By: [name] on 01/20/2021 09:27:20 -------------------------------------------------------------------------------- Clinic Level of Care Assessment Details Patient Name: Date of Service: [name] [name] 01/20/2021 9:30 A M Medical Record Number: [id number] Patient Account Number: [id number] Date of Birth/Sex: Treating RN: [birth year] (87 y.o. [name], Sean Primary Care [name]: [name] Number Other Clinician: Referring [name]: Treating [name]/Extender: [name] in Treatment: 10 Clinic Level of Care Assessment Items TOOL 4 Quantity Score X- 1 0 Use when only an EandM is performed on FOLLOW-UP visit ASSESSMENTS - Nursing Assessment / Reassessment X- 1 10 Reassessment of Co-morbidities (includes updates in patient status) X- 1 5 Reassessment of Adherence to Treatment Plan ASSESSMENTS - Wound and Skin A ssessment / Reassessment '[]'$  - 0 Simple Wound Assessment / Reassessment - one wound X- 2 5 Complex Wound Assessment / Reassessment - multiple wounds '[]'$  - 0 Dermatologic / Skin Assessment (not related to wound area) ASSESSMENTS - Focused Assessment '[]'$  - 0 Circumferential Edema Measurements - multi extremities '[]'$  - 0 Nutritional Assessment / Counseling / Intervention '[]'$  - 0 Lower Extremity Assessment (monofilament, tuning fork, pulses) '[]'$  - 0 Peripheral Arterial Disease Assessment (using hand held doppler) ASSESSMENTS - Ostomy and/or Continence Assessment and Care '[]'$  - 0 Incontinence Assessment and Management '[]'$  - 0 Ostomy Care Assessment and Management (repouching, etc.) PROCESS - Coordination of Care X - Simple Patient / Family Education for ongoing care 1 15 '[]'$  - 0 Complex (extensive) Patient / Family Education for ongoing care X- 1 10 Staff obtains Programmer, systems, Records, T Results / Process Orders est '[]'$  - 0 Staff telephones HHA, Nursing Homes / Clarify orders / etc '[]'$  - 0 Routine Transfer to another Facility (non-emergent condition) '[]'$  - 0 Routine Hospital Admission (non-emergent condition) '[]'$  - 0 New Admissions / Biomedical engineer / Ordering NPWT Apligraf, etc. , '[]'$  - 0 Emergency Hospital Admission (emergent condition) X- 1 10 Simple Discharge Coordination '[]'$  - 0 Complex (extensive) Discharge Coordination PROCESS - Special Needs '[]'$  - 0 Pediatric / Minor Patient Management '[]'$  - 0 Isolation Patient Management '[]'$  - 0 Hearing / Language / Visual special needs '[]'$  - 0 Assessment of  Community assistance (transportation, D/C planning, etc.) '[]'$  - 0 Additional assistance / Altered mentation '[]'$  - 0 Support Surface(s) Assessment (bed, cushion, seat, etc.)  INTERVENTIONS - Wound Cleansing / Measurement '[]'$  - 0 Simple Wound Cleansing - one wound X- 2 5 Complex Wound Cleansing - multiple wounds X- 1 5 Wound Imaging (photographs - any number of wounds) '[]'$  - 0 Wound Tracing (instead of photographs) '[]'$  - 0 Simple Wound Measurement - one wound X- 2 5 Complex Wound Measurement - multiple wounds INTERVENTIONS - Wound Dressings '[]'$  - 0 Small Wound Dressing one or multiple wounds X- 2 15 Medium Wound Dressing one or multiple wounds '[]'$  - 0 Large Wound Dressing one or multiple wounds '[]'$  - 0 Application of Medications - topical '[]'$  - 0 Application of Medications - injection INTERVENTIONS - Miscellaneous '[]'$  - 0 External ear exam '[]'$  - 0 Specimen Collection (cultures, biopsies, blood, body fluids, etc.) '[]'$  - 0 Specimen(s) / Culture(s) sent or taken to Lab for analysis '[]'$  - 0 Patient Transfer (multiple staff / Civil Service fast streamer / Similar devices) '[]'$  - 0 Simple Staple / Suture removal (25 or less) '[]'$  - 0 Complex Staple / Suture removal (26 or more) '[]'$  - 0 Hypo / Hyperglycemic Management (close monitor of Blood Glucose) '[]'$  - 0 Ankle / Brachial Index (ABI) - do not check if billed separately X- 1 5 Vital Signs Has the patient been seen at the hospital within the last three years: Yes Total Score: 120 Level Of Care: New/Established - Level 4 Electronic Signature(s) Signed: 03/03/2021 8:00:33 AM By: Sean Hammock RN Entered By: [name] on 01/20/2021 10:37:51 -------------------------------------------------------------------------------- Encounter Discharge Information Details Patient Name: Date of Service: [name], Sean Brandy [name] 01/20/2021 9:30 A M Medical Record Number: [id number] Patient Account Number: [id number] Date of Birth/Sex: Treating RN: Nov 04, 1927 (87 y.o.  [name] Primary Care [name]: [name] Number Other Clinician: Referring [name]: Treating [name]/Extender: [name] in Treatment: 10 Encounter Discharge Information Items Discharge Condition: Stable Ambulatory Status: Ambulatory Discharge Destination: Home Transportation: Private Auto Schedule Follow-up Appointment: Yes Clinical Summary of Care: Provided on 01/20/2021 Form Type Recipient Paper Patient Patient Electronic Signature(s) Signed: 01/20/2021 10:07:55 AM By: Lorrin Jackson Entered By: Lorrin Jackson on 01/20/2021 10:07:55 -------------------------------------------------------------------------------- Lower Extremity Assessment Details Patient Name: Date of Service: [name] [name] 01/20/2021 9:30 A M Medical Record Number: [id number] Patient Account Number: [id number] Date of Birth/Sex: Treating RN: [birth year] (87 y.o. [name] Primary Care Monroe Qin: [name] Number Other Clinician: Referring [place] Kirchner: Treating Lavonne Kinderman/Extender: [name] in Treatment: 10 Electronic Signature(s) Signed: 01/20/2021 5:26:44 PM By: Lorrin Jackson Entered By: Lorrin Jackson on 01/20/2021 09:32:14 -------------------------------------------------------------------------------- Multi Wound Chart Details Patient Name: Date of Service: [name], Sean Brandy [name] 01/20/2021 9:30 A M Medical Record Number: [id number] Patient Account Number: [id number] Date of Birth/Sex: Treating RN: [birth year] (87 y.o. [name], Sean Primary Care Ayona Yniguez: [name] Number Other Clinician: Referring Lanyia Jewel: Treating Megann Easterwood/Extender: [name] in Treatment: 10 Vital Signs Height(in): 18 Pulse(bpm): 83 Weight(lbs): 161 Blood Pressure(mmHg): 154/66 Body Mass Index(BMI): 24 Temperature(F): 97.8 Respiratory Rate(breaths/min): 16 Photos: [N/A:N/A] Head - frontal Left Forearm N/A Wound  Location: Surgical Injury Trauma N/A Wounding Event: Soft Tissue Radionecrosis Skin T ear N/A Primary Etiology: Open Surgical Wound N/A N/A Secondary Etiology: Cataracts, Chronic Obstructive Cataracts, Chronic Obstructive N/A Comorbid History: Pulmonary Disease (COPD), ReceivedPulmonary Disease (COPD), Received Radiation Radiation 12/06/2018 01/20/2021 N/A Date Acquired: 10 0 N/A Weeks of Treatment: Open Open N/A Wound Status: 6.2x4.5x0.2 3.7x3x0.1 N/A Measurements L x W x D (cm) 21.913 8.718 N/A A (cm) : rea 4.383 0.872 N/A Volume (cm) : 7.70%  0.00% N/A % Reduction in Area: 7.70% 0.00% N/A % Reduction in Volume: Full Thickness With Exposed Support Full Thickness Without Exposed N/A Classification: Structures Support Structures Medium Medium N/A Exudate Amount: Serous Sanguinous N/A Exudate Type: amber red N/A Exudate Color: Distinct, outline attached Distinct, outline attached N/A Wound Margin: Small (1-33%) Large (67-100%) N/A Granulation Amount: Pink Red N/A Granulation Quality: Large (67-100%) None Present (0%) N/A Necrotic Amount: Fat Layer (Subcutaneous Tissue): Yes Fat Layer (Subcutaneous Tissue): Yes N/A Exposed Structures: Bone: Yes Fascia: No Fascia: No Tendon: No Tendon: No Muscle: No Muscle: No Joint: No Joint: No Bone: No Small (1-33%) None N/A Epithelialization: Treatment Notes Wound #1 (Head - frontal) Cleanser Wound Cleanser Discharge Instruction: Cleanse the wound with wound cleanser prior to applying a clean dressing using gauze sponges, not tissue or cotton balls. Peri-Wound Care Topical Primary Dressing XTRASORB Hydrogel Sheet, 4.5x4.5 (in/in) Secondary Dressing Zetuvit Plus Silicone Border Dressing 4x4 (in/in) Discharge Instruction: Apply silicone border over primary dressing as directed. Secured With Compression Wrap Compression Stockings Add-Ons Wound #2 (Forearm) Wound Laterality: Left Cleanser Wound  Cleanser Discharge Instruction: Cleanse the wound with wound cleanser prior to applying a clean dressing using gauze sponges, not tissue or cotton balls. Peri-Wound Care Topical Bacitracin Ointment, 1 (oz) tube Primary Dressing Secondary Dressing T Non-adherent Dressing, 2x3 in elfa Discharge Instruction: Apply over primary dressing as directed. Secured With Conforming Stretch Gauze Bandage, Sterile 2x75 (in/in) Discharge Instruction: Secure with stretch gauze as directed. 70M Medipore H Soft Cloth Surgical T ape, 2x2 (in/yd) Discharge Instruction: Secure dressing with tape as directed. Compression Wrap Compression Stockings Add-Ons Electronic Signature(s) Signed: 01/20/2021 10:43:13 AM By: Kalman Shan DO Signed: 03/03/2021 8:00:33 AM By: Sean Hammock RN Entered By: Kalman Shan on 01/20/2021 10:38:03 -------------------------------------------------------------------------------- Multi-Disciplinary Care Plan Details Patient Name: Date of Service: [name], Sean Brandy [name] 01/20/2021 9:30 A M Medical Record Number: [id number] Patient Account Number: [id number] Date of Birth/Sex: Treating RN: 02/26/1928 (87 y.o. [name], Sean Primary Care Kathlen Sakurai: [name] Number Other Clinician: Referring Avante Carneiro: Treating Mammie Meras/Extender: [name] in Treatment: 10 Active Inactive HBO Nursing Diagnoses: Potential for barotraumas to ears, sinuses, teeth, and lungs or cerebral gas embolism related to changes in atmospheric pressure inside hyperbaric oxygen chamber Potential for oxygen toxicity seizures related to delivery of 100% oxygen at an increased atmospheric pressure Potential for pulmonary oxygen toxicity related to delivery of 100% oxygen at an increased atmospheric pressure Goals: Barotrauma will be prevented during HBO2 Date Initiated: 11/25/2020 Target Resolution Date: 12/26/2020 Goal Status: Active Patient will tolerate the hyperbaric  oxygen therapy treatment Date Initiated: 11/25/2020 Target Resolution Date: 12/26/2020 Goal Status: Active Patient/caregiver will verbalize understanding of HBO goals, rationale, procedures and potential hazards Date Initiated: 11/25/2020 Target Resolution Date: 12/26/2020 Goal Status: Active Interventions: Administer decongestants, per physician orders, prior to HBO2 Assess and provide for patients comfort related to the hyperbaric environment and equalization of middle ear Assess patient for any history of confinement anxiety Notes: Electronic Signature(s) Signed: 03/03/2021 8:00:33 AM By: Sean Hammock RN Entered By: [name] on 01/20/2021 10:36:49 -------------------------------------------------------------------------------- Pain Assessment Details Patient Name: Date of Service: [name], Sean Brandy [name] 01/20/2021 9:30 A M Medical Record Number: [id number] Patient Account Number: [id number] Date of Birth/Sex: Treating RN: 1927-08-13 (87 y.o. [name] Primary Care Tujuana Kilmartin: [name] Number Other Clinician: Referring Alyshia Kernan: Treating Darly Massi/Extender: [name] in Treatment: 10 Active Problems Location of Pain Severity and Description of Pain Patient Has Paino No Site Locations Pain Management  and Medication Current Pain Management: Electronic Signature(s) Signed: 02/03/2021 11:33:27 AM By: [name] Signed: 03/03/2021 8:00:33 AM By: Sean Hammock RN Entered By: [name] on 01/20/2021 09:26:15 -------------------------------------------------------------------------------- Patient/Caregiver Education Details Patient Name: Date of Service: [name], Sean Brandy [name] 8/2/2022andnbsp9:30 A M Medical Record Number: [id number] Patient Account Number: [id number] Date of Birth/Gender: Treating RN: 1927/09/07 (87 y.o. [name] Primary Care Physician: [name] Number Other Clinician: Referring  Physician: Treating Physician/Extender: [name] in Treatment: 10 Education Assessment Education Provided To: Patient Education Topics Provided Wound/Skin Impairment: Methods: Explain/Verbal Responses: State content correctly [employer]) Signed: 03/03/2021 8:00:33 AM By: Sean Hammock RN Entered By: [name] on 01/20/2021 10:37:00 -------------------------------------------------------------------------------- Wound Assessment Details Patient Name: Date of Service: [name], Sean Brandy [name] 01/20/2021 9:30 A M Medical Record Number: [id number] Patient Account Number: [id number] Date of Birth/Sex: Treating RN: [birth year] (87 y.o. [name] Primary Care Chaz Ronning: [name] Number Other Clinician: Referring Dedee Liss: Treating Roniyah Llorens/Extender: [name] in Treatment: 10 Wound Status Wound Number: 1 Primary Soft Tissue Radionecrosis Etiology: Wound Location: Head - frontal Secondary Open Surgical Wound Wounding Event: Surgical Injury Etiology: Date Acquired: 12/06/2018 Wound Status: Open Weeks Of Treatment: 10 Comorbid Cataracts, Chronic Obstructive Pulmonary Disease (COPD), Clustered Wound: No History: Received Radiation Photos Wound Measurements Length: (cm) 6.2 Width: (cm) 4.5 Depth: (cm) 0.2 Area: (cm) 21.913 Volume: (cm) 4.383 % Reduction in Area: 7.7% % Reduction in Volume: 7.7% Epithelialization: Small (1-33%) Tunneling: No Undermining: No Wound Description Classification: Full Thickness With Exposed Support Structures Wound Margin: Distinct, outline attached Exudate Amount: Medium Exudate Type: Serous Exudate Color: amber Foul Odor After Cleansing: No Slough/Fibrino Yes Wound Bed Granulation Amount: Small (1-33%) Exposed Structure Granulation Quality: Pink Fascia Exposed: No Necrotic Amount: Large (67-100%) Fat Layer (Subcutaneous Tissue) Exposed: Yes Necrotic  Quality: Adherent Slough Tendon Exposed: No Muscle Exposed: No Joint Exposed: No Bone Exposed: Yes Electronic Signature(s) Signed: 01/20/2021 5:26:44 PM By: Lorrin Jackson Entered By: Lorrin Jackson on 01/20/2021 09:37:09 -------------------------------------------------------------------------------- Wound Assessment Details Patient Name: Date of Service: [name], Sean Brandy [name] 01/20/2021 9:30 A M Medical Record Number: [id number] Patient Account Number: [id number] Date of Birth/Sex: Treating RN: 06-10-1928 (87 y.o. [name], Sean Primary Care Keren Alverio: [name] Number Other Clinician: Referring Juniel Groene: Treating Tyshika Baldridge/Extender: [name] in Treatment: 10 Wound Status Wound Number: 2 Primary Skin Tear Etiology: Wound Location: Left Forearm Wound Open Wounding Event: Trauma Status: Date Acquired: 01/20/2021 Comorbid Cataracts, Chronic Obstructive Pulmonary Disease (COPD), Weeks Of Treatment: 0 History: Received Radiation Clustered Wound: No Photos Wound Measurements Length: (cm) 3.7 Width: (cm) 3 Depth: (cm) 0.1 Area: (cm) 8.718 Volume: (cm) 0.872 % Reduction in Area: 0% % Reduction in Volume: 0% Epithelialization: None Tunneling: No Undermining: No Wound Description Classification: Full Thickness Without Exposed Support Structures Wound Margin: Distinct, outline attached Exudate Amount: Medium Exudate Type: Sanguinous Exudate Color: red Foul Odor After Cleansing: No Slough/Fibrino No Wound Bed Granulation Amount: Large (67-100%) Exposed Structure Granulation Quality: Red Fascia Exposed: No Necrotic Amount: None Present (0%) Fat Layer (Subcutaneous Tissue) Exposed: Yes Tendon Exposed: No Muscle Exposed: No Joint Exposed: No Bone Exposed: No Electronic Signature(s) Signed: 01/20/2021 5:26:44 PM By: Lorrin Jackson Signed: 03/03/2021 8:00:33 AM By: Sean Hammock RN Entered By: Lorrin Jackson on 01/20/2021  09:37:40 -------------------------------------------------------------------------------- Vitals Details Patient Name: Date of Service: [name], Sean Brandy [name] 01/20/2021 9:30 A M Medical Record Number: [id number] Patient Account Number: [id number] Date of Birth/Sex: Treating RN: 03-25-1928 (87 y.o. [name],  Sean Primary Care Samer Dutton: [name] Number Other Clinician: Referring Lachrisha Ziebarth: Treating Shavonte Zhao/Extender: [name] in Treatment: 10 Vital Signs Time Taken: 09:25 Temperature (F): 97.8 Height (in): 69 Pulse (bpm): 91 Weight (lbs): 161 Respiratory Rate (breaths/min): 16 Body Mass Index (BMI): 23.8 Blood Pressure (mmHg): 154/66 Reference Range: 80 - 120 mg / dl Electronic Signature(s) Signed: 02/03/2021 11:33:27 AM By: [name] Entered By: [name] on 01/20/2021 09:26:10

## 2021-03-03 NOTE — Progress Notes (Signed)
GIOVONI, HEFFERON ([id number]) Visit Report for 01/20/2021 Chief Complaint Document Details Patient Name: Date of Service: [name] [name] 01/20/2021 9:30 A M Medical Record Number: [id number] Patient Account Number: [id number] Date of Birth/Sex: Treating RN: [birth year] (87 y.o. [name], [name] Primary Care Provider: Rich Number Other Clinician: Referring Provider: Treating Provider/Extender: [name] in Treatment: 10 Information Obtained from: Patient Chief Complaint Open wound With calvarium exposed Electronic Signature(s) Signed: 01/20/2021 10:43:13 AM By: Kalman Shan DO Entered By: Kalman Shan on 01/20/2021 10:38:09 -------------------------------------------------------------------------------- HPI Details Patient Name: Date of Service: [name], Sean Brandy [name] 01/20/2021 9:30 A M Medical Record Number: [id number] Patient Account Number: [id number] Date of Birth/Sex: Treating RN: [birth year] (87 y.o. 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 The presents today for evaluation of hyperbaric oxygen for exposed calvarium from a previously removed squamous cell carcinoma lesion. He is currently using hydrogel to the area and keeping it covered. He takes doxycycline for chronic suppression of chronic osteomyelitis. He currently denies any signs of infection. He had Mohs surgery to remove the squamous cell carcinoma in 2019. He subsequently had radiation treatment from 02/02/2018 - 03/07/2018 to the left forehead with 5 5.00GY in 22  fractions using 6E electrons with a clinical set up technique. He had a nonhealing wound with exposed calvarium and And a full- thickness skin graft was attempted on 12/2018. on 07/13/2019 he was evaluated by Infectious disease for evaluation of purulent drainage. 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 He was given 6 weeks of IV antibiotics and is currently on lifelong doxycycline for suppression. 6/7; patient presents for 2-week follow-up. He has been using collagen with hydrogel to the wound bed. He reports no issues with this. He denies signs of acute infection. 6/21; patient presents for 2-week follow-up. He has been using collagen with hydrogel to the wound bed. He denies acute signs of infection. He completed his chest x-ray and echocardiogram for approval of hyperbaric oxygen. At this time he would like to complete the approval process however does not want to start HBO therapy at this time. His wife is concerned about his COPD being an issue for HBO therapy and they would like more time to think about it. 8/2; patient presents for follow-up. He has been using collagen occasionally with hydrogel to the exposed calvarium. He states that the collagen sticks and at times is hard to get off. Overall he is doing well and the wound is stable. He does not want to consider HBO. Earlier today patient slipped and fell and has a skin tear to his left arm. He currently has this covered. He denies systemic signs of infection. Electronic Signature(s) Signed: 01/20/2021 10:43:13 AM By: Kalman Shan DO Entered By: Kalman Shan on 01/20/2021 10:39:22 -------------------------------------------------------------------------------- Physical Exam Details Patient Name: Date of Service: [name], Sean Brandy [name] 01/20/2021 9:30 A M Medical Record Number: [id number] Patient Account Number: [id number] Date of Birth/Sex: Treating RN: May 31, 1928 (87 y.o. [name],  [name] Primary Care Provider: Rich Number Other Clinician: Referring Provider: Treating Provider/Extender: [name] in Treatment: 10 Constitutional respirations  regular, non-labored and within target range for patient.Marland Kitchen Psychiatric pleasant and cooperative. Notes Exposed calvarium with yellow rigid surface. No obvious signs of infection Left upper extremity: T the forearm there is a skin tear with granulation tissue present. No obvious signs of infection. Skin is still attached. o Electronic Signature(s) Signed: 01/20/2021 10:43:13 AM By: Kalman Shan DO Entered By: Kalman Shan on 01/20/2021 10:40:05 -------------------------------------------------------------------------------- Physician Orders Details Patient Name: Date of Service: [name], Sean Brandy [name] 01/20/2021 9:30 A M Medical Record Number: [id number] Patient Account Number: [id number] Date of Birth/Sex: Treating RN: [birth year] (87 y.o. Erie Noe Primary Care Provider: Rich Number Other Clinician: Referring Provider: Treating Provider/Extender: [name] in Treatment: 10 Verbal / Phone Orders: No Diagnosis Coding ICD-10 Coding Code Description S01.00XD Unspecified open wound of scalp, subsequent encounter S41.102A Unspecified open wound of left upper arm, initial encounter J44.9 Chronic obstructive pulmonary disease, unspecified M86.68 Other chronic osteomyelitis, other site Z85.828 Personal history of other malignant neoplasm of skin Follow-up Appointments ppointment in 2 weeks. - Dr. Heber Sherrill Return A Bathing/ Shower/ Hygiene May shower with protection but do not get wound dressing(s) wet. Wound Treatment Wound #1 - Head - frontal Cleanser: Wound Cleanser (DME) (Generic) Every Other Day/15 Days Discharge Instructions: Cleanse the wound with wound cleanser prior to applying a clean dressing using gauze sponges, not tissue or cotton  balls. Prim Dressing: XTRASORB Hydrogel Sheet, 4.5x4.5 (in/in) (DME) (Generic) Every Other Day/15 Days ary Prim Dressing: Every Other Day/15 Days ary Secondary Dressing: Zetuvit Plus Silicone Border Dressing 4x4 (in/in) (DME) (Generic) Every Other Day/15 Days Discharge Instructions: Apply silicone border over primary dressing as directed. Wound #2 - Forearm Wound Laterality: Left Cleanser: Wound Cleanser (DME) (Generic) 1 x Per Day/15 Days Discharge Instructions: Cleanse the wound with wound cleanser prior to applying a clean dressing using gauze sponges, not tissue or cotton balls. Topical: Bacitracin Ointment, 1 (oz) tube 1 x Per Day/15 Days Secondary Dressing: T Non-adherent Dressing, 2x3 in (DME) (Generic) 1 x Per Day/15 Days elfa Discharge Instructions: Apply over primary dressing as directed. Secured With: Child psychotherapist, Sterile 2x75 (in/in) (DME) (Generic) 1 x Per Day/15 Days Discharge Instructions: Secure with stretch gauze as directed. Secured With: 3M Medipore H Soft Cloth Surgical Tape, 2x2 (in/yd) (DME) (Generic) 1 x Per Day/15 Days Discharge Instructions: Secure dressing with tape as directed. Electronic Signature(s) Signed: 01/20/2021 10:43:13 AM By: Kalman Shan DO Entered By: Kalman Shan on 01/20/2021 10:40:21 -------------------------------------------------------------------------------- Problem List Details Patient Name: Date of Service: [name], Sean Brandy [name] 01/20/2021 9:30 A M Medical Record Number: [id number] Patient Account Number: [id number] Date of Birth/Sex: Treating RN: Mar 11, 1928 (87 y.o. Erie Noe Primary Care Provider: Rich Number Other Clinician: Referring Provider: Treating Provider/Extender: [name] in Treatment: 10 Active Problems ICD-10 Encounter Code Description Active Date MDM Diagnosis S01.00XD Unspecified open wound of scalp, subsequent encounter 11/20/2020 No Yes S41.102A  Unspecified open wound of left upper arm, initial encounter 01/20/2021 No Yes J44.9 Chronic obstructive pulmonary disease, unspecified 11/20/2020 No Yes M86.68 Other chronic osteomyelitis, other site 11/20/2020 No Yes Z85.828 Personal history of other malignant neoplasm of skin 11/20/2020 No Yes Inactive Problems Resolved Problems Electronic Signature(s) Signed: 01/20/2021 10:43:13 AM By: Kalman Shan DO Entered By: Kalman Shan on 01/20/2021 10:37:57 -------------------------------------------------------------------------------- Progress Note Details Patient Name: Date of Service: [name], Sean Brandy [name] 01/20/2021 9:30 A M Medical Record Number: [id number] Patient Account Number: [id number] Date of Birth/Sex: Treating RN: May 07, 1928 (87 y.o. 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 The presents today for evaluation of hyperbaric oxygen for exposed calvarium from a previously removed squamous cell carcinoma lesion. He is currently using hydrogel to the area and keeping it covered. He takes doxycycline for chronic suppression of chronic osteomyelitis. He currently denies any signs of infection. He had Mohs surgery to remove the squamous cell carcinoma in 2019. He subsequently had radiation treatment from 02/02/2018 - 03/07/2018 to the left forehead with 5 5.00GY in 22 fractions using 6E electrons with a clinical set up technique. He had a nonhealing wound with exposed calvarium and And a  full- thickness skin graft was attempted on 12/2018. on 07/13/2019 he was evaluated by Infectious disease for evaluation of purulent drainage. 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 He was given 6 weeks of IV antibiotics and is currently on lifelong doxycycline for suppression. 6/7; patient presents for 2-week follow-up. He has been using collagen with hydrogel to the wound bed. He reports no issues with this. He denies signs of acute infection. 6/21; patient presents for 2-week follow-up. He has been using collagen with hydrogel to the wound bed. He denies acute signs of infection. He completed his chest x-ray and echocardiogram for approval of hyperbaric oxygen. At this time he would like to complete the approval process however does not want to start HBO therapy at this time. His wife is concerned about his COPD being an issue for HBO therapy and they would like more time to think about it. 8/2; patient presents for follow-up. He has been using collagen occasionally with hydrogel to the exposed calvarium. He states that the collagen sticks and at times is hard to get off. Overall he is doing well and the wound is stable. He does not want to consider HBO. Earlier today patient slipped and fell and has a skin tear to his left arm. He currently has this covered. He denies systemic signs of infection. Patient History Information obtained from Patient. Family History Cancer - Father, Heart Disease - Mother, No family history of Diabetes, Hereditary Spherocytosis, Hypertension, Kidney Disease, Lung Disease, Seizures, Stroke, Thyroid Problems, Tuberculosis. Social History Former smoker, Marital Status - Married, Alcohol Use - Rarely, Drug Use - No History, Caffeine Use - Moderate. Medical History Eyes Patient has history of Cataracts - bil removed Denies history of Glaucoma, Optic Neuritis Ear/Nose/Mouth/Throat Denies history of Chronic sinus  problems/congestion, Middle ear problems Respiratory Patient has history of Chronic Obstructive Pulmonary Disease (COPD) Denies history of Aspiration, Asthma, Pneumothorax, Sleep Apnea, Tuberculosis Gastrointestinal Denies history of Cirrhosis , Colitis, Crohnoos, Hepatitis A, Hepatitis B, Hepatitis C Endocrine Denies history of Type I Diabetes, Type II Diabetes Integumentary (Skin) Denies history of History of Burn Musculoskeletal Denies history of Gout, Rheumatoid Arthritis, Osteoarthritis, Osteomyelitis Oncologic Patient has history of Received Radiation - 20 treatments Denies history of Received Chemotherapy Psychiatric Denies history of Anorexia/bulimia, Confinement Anxiety Hospitalization/Surgery History - Moh's procedure to scalp. - tracheotomy as child. - bilateral cataracts removed. - left hip replacement. Medical A Surgical History Notes nd Endocrine hypothyroidism Oncologic  sqamous cell carcinoma of the scalp Objective Constitutional respirations regular, non-labored and within target range for patient.. Vitals Time Taken: 9:25 AM, Height: 69 in, Weight: 161 lbs, BMI: 23.8, Temperature: 97.8 F, Pulse: 91 bpm, Respiratory Rate: 16 breaths/min, Blood Pressure: 154/66 mmHg. Psychiatric pleasant and cooperative. General Notes: Exposed calvarium with yellow rigid surface. No obvious signs of infection Left upper extremity: T the forearm there is a skin tear with o granulation tissue present. No obvious signs of infection. Skin is still attached. Integumentary (Hair, Skin) Wound #1 status is Open. Original cause of wound was Surgical Injury. The date acquired was: 12/06/2018. The wound has been in treatment 10 weeks. The wound is located on the Head - frontal. The wound measures 6.2cm length x 4.5cm width x 0.2cm depth; 21.913cm^2 area and 4.383cm^3 volume. There is bone and Fat Layer (Subcutaneous Tissue) exposed. There is no tunneling or undermining noted. There is a  medium amount of serous drainage noted. The wound margin is distinct with the outline attached to the wound base. There is small (1-33%) pink granulation within the wound bed. There is a large (67-100%) amount of necrotic tissue within the wound bed including Adherent Slough. Wound #2 status is Open. Original cause of wound was Trauma. The date acquired was: 01/20/2021. The wound is located on the Left Forearm. The wound measures 3.7cm length x 3cm width x 0.1cm depth; 8.718cm^2 area and 0.872cm^3 volume. There is Fat Layer (Subcutaneous Tissue) exposed. There is no tunneling or undermining noted. There is a medium amount of sanguinous drainage noted. The wound margin is distinct with the outline attached to the wound base. There is large (67-100%) red granulation within the wound bed. There is no necrotic tissue within the wound bed. Assessment Active Problems ICD-10 Unspecified open wound of scalp, subsequent encounter Unspecified open wound of left upper arm, initial encounter Chronic obstructive pulmonary disease, unspecified Other chronic osteomyelitis, other site Personal history of other malignant neoplasm of skin Calvarium wound appears stable. I recommended just doing hydrogel to this area since collagen is sticking. We will reorder supplies. For his left arm he has a fresh wound that occurred today. I recommended using antibiotic ointment And keeping the area covered. The skin that is still attached may have to come off but we will watch and wait. No signs of infection. Follow-up in 2 weeks. Plan Follow-up Appointments: Return Appointment in 2 weeks. - Dr. Heber  Bathing/ Shower/ Hygiene: May shower with protection but do not get wound dressing(s) wet. WOUND #1: - Head - frontal Wound Laterality: Cleanser: Wound Cleanser (DME) (Generic) Every Other Day/15 Days Discharge Instructions: Cleanse the wound with wound cleanser prior to applying a clean dressing using gauze sponges, not  tissue or cotton balls. Prim Dressing: XTRASORB Hydrogel Sheet, 4.5x4.5 (in/in) (DME) (Generic) Every Other Day/15 Days ary Prim Dressing: Every Other Day/15 Days ary Secondary Dressing: Zetuvit Plus Silicone Border Dressing 4x4 (in/in) (DME) (Generic) Every Other Day/15 Days Discharge Instructions: Apply silicone border over primary dressing as directed. WOUND #2: - Forearm Wound Laterality: Left Cleanser: Wound Cleanser (DME) (Generic) 1 x Per Day/15 Days Discharge Instructions: Cleanse the wound with wound cleanser prior to applying a clean dressing using gauze sponges, not tissue or cotton balls. Topical: Bacitracin Ointment, 1 (oz) tube 1 x Per Day/15 Days Secondary Dressing: T Non-adherent Dressing, 2x3 in (DME) (Generic) 1 x Per Day/15 Days elfa Discharge Instructions: Apply over primary dressing as directed. Secured With: Child psychotherapist, Sterile 2x75 (in/in) (DME) (Generic) 1  x Per [id number] Days Discharge Instructions: Secure with stretch gauze as directed. Secured With: 47M Medipore H Soft Cloth Surgical T ape, 2x2 (in/yd) (DME) (Generic) 1 x Per Day/15 Days Discharge Instructions: Secure dressing with tape as directed. 1. Hydrogel to the calvarium 2. Antibiotic ointment and keep area covered to the left forearm 3. Follow-up in 2 weeks Electronic Signature(s) Signed: 01/20/2021 10:43:13 AM By: Kalman Shan DO Entered By: Kalman Shan on 01/20/2021 10:42:32 -------------------------------------------------------------------------------- HxROS Details Patient Name: Date of Service: [name], JA [name] 01/20/2021 9:30 A M Medical Record Number: [id number] Patient Account Number: [id number] Date of Birth/Sex: Treating RN: Mar 29, 1928 (87 y.o. Erie Noe Primary Care Provider: Rich Number Other Clinician: Referring Provider: Treating Provider/Extender: [name] in Treatment: 10 Information Obtained  From Patient Eyes Medical History: Positive for: Cataracts - bil removed Negative for: Glaucoma; Optic Neuritis Ear/Nose/Mouth/Throat Medical History: Negative for: Chronic sinus problems/congestion; Middle ear problems Respiratory Medical History: Positive for: Chronic Obstructive Pulmonary Disease (COPD) Negative for: Aspiration; Asthma; Pneumothorax; Sleep Apnea; Tuberculosis Gastrointestinal Medical History: Negative for: Cirrhosis ; Colitis; Crohns; Hepatitis A; Hepatitis B; Hepatitis C Endocrine Medical History: Negative for: Type I Diabetes; Type II Diabetes Past Medical History Notes: hypothyroidism Integumentary (Skin) Medical History: Negative for: History of Burn Musculoskeletal Medical History: Negative for: Gout; Rheumatoid Arthritis; Osteoarthritis; Osteomyelitis Oncologic Medical History: Positive for: Received Radiation - 20 treatments Negative for: Received Chemotherapy Past Medical History Notes: sqamous cell carcinoma of the scalp Psychiatric Medical History: Negative for: Anorexia/bulimia; Confinement Anxiety HBO Extended History Items Eyes: Cataracts Immunizations Pneumococcal Vaccine: Received Pneumococcal Vaccination: No Implantable Devices No devices added Hospitalization / Surgery History Type of Hospitalization/Surgery Moh's procedure to scalp tracheotomy as child bilateral cataracts removed left hip replacement Family and Social History Cancer: Yes - Father; Diabetes: No; Heart Disease: Yes - Mother; Hereditary Spherocytosis: No; Hypertension: No; Kidney Disease: No; Lung Disease: No; Seizures: No; Stroke: No; Thyroid Problems: No; Tuberculosis: No; Former smoker; Marital Status - Married; Alcohol Use: Rarely; Drug Use: No History; Caffeine Use: Moderate; Financial Concerns: No; Food, Clothing or Shelter Needs: No; Support System Lacking: No; Transportation Concerns: No Electronic Signature(s) Signed: 01/20/2021 10:43:13 AM By: Kalman Shan DO Signed: 03/03/2021 8:00:33 AM By: Rhae Hammock RN Entered By: Kalman Shan on 01/20/2021 10:39:28 -------------------------------------------------------------------------------- SuperBill Details Patient Name: Date of Service: [name], Sean Brandy [name] 01/20/2021 Medical Record Number: [id number] Patient Account Number: [id number] Date of Birth/Sex: Treating RN: 06-Jun-1928 (87 y.o. Erie Noe Primary Care Provider: Rich Number Other Clinician: Referring Provider: Treating Provider/Extender: [name] in Treatment: 10 Diagnosis Coding ICD-10 Codes Code Description S01.00XD Unspecified open wound of scalp, subsequent encounter S41.102A Unspecified open wound of left upper arm, initial encounter J44.9 Chronic obstructive pulmonary disease, unspecified M86.68 Other chronic osteomyelitis, other site Z85.828 Personal history of other malignant neoplasm of skin Facility Procedures CPT4 Code: [id number] Description: 99214 - WOUND CARE VISIT-LEV 4 EST PT Modifier: Quantity: 1 Physician Procedures : CPT4 Code Description Modifier [id number] - WC PHYS LEVEL 3 - EST PT ICD-10 Diagnosis Description S01.00XD Unspecified open wound of scalp, subsequent encounter S41.102A Unspecified open wound of left upper arm, initial encounter J44.9 Chronic  obstructive pulmonary disease, unspecified M86.68 Other chronic osteomyelitis, other site Quantity: 1 Electronic Signature(s) Signed: 01/20/2021 10:43:13 AM By: Kalman Shan DO Entered By: Kalman Shan on 01/20/2021 10:42:47

## 2021-03-23 ENCOUNTER — Ambulatory Visit: Payer: Medicare Other | Attending: Internal Medicine

## 2021-03-23 ENCOUNTER — Other Ambulatory Visit (HOSPITAL_BASED_OUTPATIENT_CLINIC_OR_DEPARTMENT_OTHER): Payer: Self-pay

## 2021-03-23 DIAGNOSIS — Z23 Encounter for immunization: Secondary | ICD-10-CM

## 2021-03-23 MED ORDER — PFIZER COVID-19 VAC BIVALENT 30 MCG/0.3ML IM SUSP
INTRAMUSCULAR | 0 refills | Status: AC
  Filled 2021-03-23: qty 0.3, 1d supply, fill #0

## 2021-03-23 NOTE — Progress Notes (Signed)
   Cov[facility] Vaccination [facility]  Name:  JAVIAN NUDD    MRN: [id number] DOB: 12-07-1927  03/23/2021  Mr. Bellucci was observed post Covid-19 immunization for 15 minutes without incident. He was provided with Vaccine Information Sheet and instruction to access the V-Safe system.   Mr. Retz was instructed to call 911 with any severe reactions post vaccine: Difficulty breathing  Swelling of face and throat  A fast heartbeat  A bad rash all over body  Dizziness and weakness

## 2021-03-30 ENCOUNTER — Other Ambulatory Visit: Payer: Self-pay

## 2021-03-30 ENCOUNTER — Encounter (HOSPITAL_BASED_OUTPATIENT_CLINIC_OR_DEPARTMENT_OTHER): Payer: Medicare Other | Attending: Internal Medicine | Admitting: Internal Medicine

## 2021-03-30 DIAGNOSIS — Z87891 Personal history of nicotine dependence: Secondary | ICD-10-CM | POA: Diagnosis not present

## 2021-03-30 DIAGNOSIS — M8668 Other chronic osteomyelitis, other site: Secondary | ICD-10-CM | POA: Diagnosis not present

## 2021-03-30 DIAGNOSIS — S41102A Unspecified open wound of left upper arm, initial encounter: Secondary | ICD-10-CM | POA: Insufficient documentation

## 2021-03-30 DIAGNOSIS — Y838 Other surgical procedures as the cause of abnormal reaction of the patient, or of later complication, without mention of misadventure at the time of the procedure: Secondary | ICD-10-CM | POA: Diagnosis not present

## 2021-03-30 DIAGNOSIS — J449 Chronic obstructive pulmonary disease, unspecified: Secondary | ICD-10-CM | POA: Insufficient documentation

## 2021-03-30 DIAGNOSIS — Z9981 Dependence on supplemental oxygen: Secondary | ICD-10-CM | POA: Insufficient documentation

## 2021-03-30 DIAGNOSIS — Z85828 Personal history of other malignant neoplasm of skin: Secondary | ICD-10-CM | POA: Diagnosis not present

## 2021-03-30 DIAGNOSIS — S0100XA Unspecified open wound of scalp, initial encounter: Secondary | ICD-10-CM | POA: Diagnosis not present

## 2021-03-30 DIAGNOSIS — Z923 Personal history of irradiation: Secondary | ICD-10-CM | POA: Diagnosis not present

## 2021-03-30 DIAGNOSIS — S41102D Unspecified open wound of left upper arm, subsequent encounter: Secondary | ICD-10-CM

## 2021-03-30 DIAGNOSIS — S0100XD Unspecified open wound of scalp, subsequent encounter: Secondary | ICD-10-CM

## 2021-03-30 DIAGNOSIS — J9611 Chronic respiratory failure with hypoxia: Secondary | ICD-10-CM | POA: Diagnosis not present

## 2021-03-30 NOTE — Progress Notes (Signed)
AVID, GUILLETTE ([id number]) Visit Report for 03/30/2021 Arrival Information Details Patient Name: Date of Service: [name] [name] 03/30/2021 11:00 A M Medical Record Number: [id number] Patient Account Number: [id number] Date of Birth/Sex: Treating RN: 06-Dec-1927 (87 y.o. [name], [name] Primary Care Emmanuelle Hibbitts: Rich Number Other Clinician: Referring Arah Aro: Treating Tamberlyn Midgley/Extender: [name] in Treatment: 34 Visit Information History Since Last Visit Added or deleted any medications: No Patient Arrived: Ambulatory Any new allergies or adverse reactions: No Arrival Time: 10:47 Had a fall or experienced change in No Accompanied By: alone activities of daily living that may affect Transfer Assistance: None risk of falls: Patient Identification Verified: Yes Signs or symptoms of abuse/neglect since last visito No Secondary Verification Process Completed: Yes Hospitalized since last visit: No Patient Requires Transmission-Based Precautions: No Implantable device outside of the clinic excluding No Patient Has Alerts: No cellular tissue based products placed in the center since last visit: Has Dressing in Place as Prescribed: Yes Pain Present Now: No Electronic Signature(s) Signed: 03/30/2021 4:58:30 PM By: Levan Hurst RN, BSN Entered By: Levan Hurst on 03/30/2021 10:47:54 -------------------------------------------------------------------------------- Clinic Level of Care Assessment Details Patient Name: Date of Service: [name] [name] 03/30/2021 11:00 A M Medical Record Number: [id number] Patient Account Number: [id number] Date of Birth/Sex: Treating RN: 1928-01-07 (87 y.o. [name] Primary Care Susana Gripp: Rich Number Other Clinician: Referring Alexzavier Girardin: Treating Ara Grandmaison/Extender: [name] in Treatment: 19 Clinic Level of Care Assessment Items TOOL 4 Quantity Score X- 1 0 Use  when only an EandM is performed on FOLLOW-UP visit ASSESSMENTS - Nursing Assessment / Reassessment X- 1 10 Reassessment of Co-morbidities (includes updates in patient status) X- 1 5 Reassessment of Adherence to Treatment Plan ASSESSMENTS - Wound and Skin A ssessment / Reassessment X - Simple Wound Assessment / Reassessment - one wound 1 5 []  - 0 Complex Wound Assessment / Reassessment - multiple wounds []  - 0 Dermatologic / Skin Assessment (not related to wound area) ASSESSMENTS - Focused Assessment []  - 0 Circumferential Edema Measurements - multi extremities []  - 0 Nutritional Assessment / Counseling / Intervention []  - 0 Lower Extremity Assessment (monofilament, tuning fork, pulses) []  - 0 Peripheral Arterial Disease Assessment (using hand held doppler) ASSESSMENTS - Ostomy and/or Continence Assessment and Care []  - 0 Incontinence Assessment and Management []  - 0 Ostomy Care Assessment and Management (repouching, etc.) PROCESS - Coordination of Care X - Simple Patient / Family Education for ongoing care 1 15 []  - 0 Complex (extensive) Patient / Family Education for ongoing care X- 1 10 Staff obtains Programmer, systems, Records, T Results / Process Orders est []  - 0 Staff telephones HHA, Nursing Homes / Clarify orders / etc []  - 0 Routine Transfer to another Facility (non-emergent condition) []  - 0 Routine Hospital Admission (non-emergent condition) []  - 0 New Admissions / Biomedical engineer / Ordering NPWT Apligraf, etc. , []  - 0 Emergency Hospital Admission (emergent condition) X- 1 10 Simple Discharge Coordination []  - 0 Complex (extensive) Discharge Coordination PROCESS - Special Needs []  - 0 Pediatric / Minor Patient Management []  - 0 Isolation Patient Management []  - 0 Hearing / Language / Visual special needs []  - 0 Assessment of Community assistance (transportation, D/C planning, etc.) []  - 0 Additional assistance / Altered mentation []  - 0 Support  Surface(s) Assessment (bed, cushion, seat, etc.) INTERVENTIONS - Wound Cleansing / Measurement X - Simple Wound Cleansing - one wound 1 5 []  - 0 Complex Wound  Cleansing - multiple wounds X- 1 5 Wound Imaging (photographs - any number of wounds) []  - 0 Wound Tracing (instead of photographs) X- 1 5 Simple Wound Measurement - one wound []  - 0 Complex Wound Measurement - multiple wounds INTERVENTIONS - Wound Dressings X - Small Wound Dressing one or multiple wounds 1 10 []  - 0 Medium Wound Dressing one or multiple wounds []  - 0 Large Wound Dressing one or multiple wounds []  - 0 Application of Medications - topical []  - 0 Application of Medications - injection INTERVENTIONS - Miscellaneous []  - 0 External ear exam []  - 0 Specimen Collection (cultures, biopsies, blood, body fluids, etc.) []  - 0 Specimen(s) / Culture(s) sent or taken to Lab for analysis []  - 0 Patient Transfer (multiple staff / Civil Service fast streamer / Similar devices) []  - 0 Simple Staple / Suture removal (25 or less) []  - 0 Complex Staple / Suture removal (26 or more) []  - 0 Hypo / Hyperglycemic Management (close monitor of Blood Glucose) []  - 0 Ankle / Brachial Index (ABI) - do not check if billed separately X- 1 5 Vital Signs Has the patient been seen at the hospital within the last three years: Yes Total Score: 85 Level Of Care: New/Established - Level 3 Electronic Signature(s) Signed: 03/30/2021 4:58:30 PM By: Levan Hurst RN, BSN Entered By: Levan Hurst on 03/30/2021 11:53:56 -------------------------------------------------------------------------------- Encounter Discharge Information Details Patient Name: Date of Service: [name] [name] 03/30/2021 11:00 A M Medical Record Number: [id number] Patient Account Number: [id number] Date of Birth/Sex: Treating RN: 04/27/1928 (87 y.o. [name] Primary Care Onya Eutsler: Rich Number Other Clinician: Referring Maralyn Witherell: Treating  Journee Bobrowski/Extender: [name] in Treatment: 19 Encounter Discharge Information Items Discharge Condition: Stable Ambulatory Status: Ambulatory Discharge Destination: Home Transportation: Private Auto Accompanied By: alone Schedule Follow-up Appointment: Yes Clinical Summary of Care: Patient Declined Electronic Signature(s) Signed: 03/30/2021 4:58:30 PM By: Levan Hurst RN, BSN Entered By: Levan Hurst on 03/30/2021 11:54:28 -------------------------------------------------------------------------------- Multi Wound Chart Details Patient Name: Date of Service: [name] [name] 03/30/2021 11:00 A M Medical Record Number: [id number] Patient Account Number: [id number] Date of Birth/Sex: Treating RN: [birth year] (87 y.o. [name] Primary Care Curlie Macken: Rich Number Other Clinician: Referring Ambera Fedele: Treating Davanee Klinkner/Extender: [name] in Treatment: 19 Vital Signs Height(in): 67 Pulse(bpm): 41 Weight(lbs): 161 Blood Pressure(mmHg): 174/83 Body Mass Index(BMI): 24 Temperature(F): 97.8 Respiratory Rate(breaths/min): 16 Photos: [1:Head - frontal] [2:Left Forearm] [N/A:N/A N/A] Wound Location: [1:Surgical Injury] [2:Trauma] [N/A:N/A] Wounding Event: [1:Soft Tissue Radionecrosis] [2:Skin T ear] [N/A:N/A] Primary Etiology: [1:Open Surgical Wound] [2:N/A] [N/A:N/A] Secondary Etiology: [1:Cataracts, Chronic Obstructive] [2:Cataracts, Chronic Obstructive] [N/A:N/A] Comorbid History: [1:Pulmonary Disease (COPD), ReceivedPulmonary Disease (COPD), Received Radiation 12/06/2018] [2:Radiation 01/20/2021] [N/A:N/A] Date Acquired: [1:19] [2:9] [N/A:N/A] Weeks of Treatment: [1:Open] [2:Healed - Epithelialized] [N/A:N/A] Wound Status: [1:6.8x3.6x0.2] [2:0x0x0] [N/A:N/A] Measurements L x W x D (cm) [1:19.227] [2:0] [N/A:N/A] A (cm) : rea [1:3.845] [2:0] [N/A:N/A] Volume (cm) : [1:19.00%] [2:100.00%] [N/A:N/A] %  Reduction in Area: [1:19.10%] [2:100.00%] [N/A:N/A] % Reduction in Volume: [1:Full Thickness With Exposed Support Full Thickness Without Exposed] [N/A:N/A] Classification: [1:Structures Medium] [2:Support Structures None Present] [N/A:N/A] Exudate Amount: [1:Serous] [2:N/A] [N/A:N/A] Exudate Type: [1:amber] [2:N/A] [N/A:N/A] Exudate Color: [1:Distinct, outline attached] [2:Distinct, outline attached] [N/A:N/A] Wound Margin: [1:Small (1-33%)] [2:None Present (0%)] [N/A:N/A] Granulation Amount: [1:Pink, Pale] [2:N/A] [N/A:N/A] Granulation Quality: [1:Large (67-100%)] [2:None Present (0%)] [N/A:N/A] Necrotic Amount: [1:Fat Layer (Subcutaneous Tissue): Yes Fascia: No] [N/A:N/A] Exposed Structures: [1:Bone: Yes Fascia: No Tendon: No Muscle:  No Joint: No Small (1-33%)] [2:Fat Layer (Subcutaneous Tissue): No Tendon: No Muscle: No Joint: No Bone: No Large (67-100%)] [N/A:N/A] Treatment Notes Wound #1 (Head - frontal) Cleanser Wound Cleanser Discharge Instruction: Cleanse the wound with wound cleanser prior to applying a clean dressing using gauze sponges, not tissue or cotton balls. Peri-Wound Care Topical Primary Dressing Gentell Hydrogel Silver Saturated Gauze, 4x4 (in/in) Discharge Instruction: Apply to wound bed as directed Secondary Dressing Zetuvit Plus Silicone Border Dressing 5x5 (in/in) Discharge Instruction: Apply silicone border over primary dressing as directed. Secured With Compression Wrap Compression Stockings Environmental education officer) Signed: 03/30/2021 12:17:29 PM By: Kalman Shan DO Signed: 03/30/2021 5:11:49 PM By: Fara Chute By: Kalman Shan on 03/30/2021 12:06:37 -------------------------------------------------------------------------------- Multi-Disciplinary Care Plan Details Patient Name: Date of Service: [name] [name] 03/30/2021 11:00 A M Medical Record Number: [id number] Patient Account Number: [id number] Date of  Birth/Sex: Treating RN: 08/12/1927 (87 y.o. [name] Primary Care Skylee Baird: Rich Number Other Clinician: Referring Marrio Scribner: Treating Dinesh Ulysse/Extender: [name] in Treatment: 19 Active Inactive Wound/Skin Impairment Nursing Diagnoses: Impaired tissue integrity Goals: Patient/caregiver will verbalize understanding of skin care regimen Date Initiated: 02/03/2021 Target Resolution Date: 05/01/2021 Goal Status: Active Interventions: Assess patient/caregiver ability to obtain necessary supplies Assess patient/caregiver ability to perform ulcer/skin care regimen upon admission and as needed Assess ulceration(s) every visit Provide education on ulcer and skin care Notes: Electronic Signature(s) Signed: 03/30/2021 4:58:30 PM By: Levan Hurst RN, BSN Entered By: Levan Hurst on 03/30/2021 10:48:41 -------------------------------------------------------------------------------- Pain Assessment Details Patient Name: Date of Service: [name] [name] 03/30/2021 11:00 A M Medical Record Number: [id number] Patient Account Number: [id number] Date of Birth/Sex: Treating RN: 1928/04/05 (87 y.o. [name] Primary Care Tereka Thorley: Rich Number Other Clinician: Referring Correy Weidner: Treating Elani Delph/Extender: [name] in Treatment: 19 Active Problems Location of Pain Severity and Description of Pain Patient Has Paino No Site Locations Pain Management and Medication Current Pain Management: Electronic Signature(s) Signed: 03/30/2021 4:58:30 PM By: Levan Hurst RN, BSN Entered By: Levan Hurst on 03/30/2021 10:48:14 -------------------------------------------------------------------------------- Patient/Caregiver Education Details Patient Name: Date of Service: [name] [name] 10/10/2022andnbsp11:00 [place] Record Number: [id number] Patient Account Number: [id number] Date of  Birth/Gender: Treating RN: 1927-12-27 (87 y.o. [name] Primary Care Physician: Rich Number Other Clinician: Referring Physician: Treating Physician/Extender: [name] in Treatment: 19 Education Assessment Education Provided To: Patient Education Topics Provided Wound/Skin Impairment: Methods: Explain/Verbal Responses: State content correctly [employer]) Signed: 03/30/2021 4:58:30 PM By: Levan Hurst RN, BSN Entered By: Levan Hurst on 03/30/2021 10:48:57 -------------------------------------------------------------------------------- Wound Assessment Details Patient Name: Date of Service: [name] [name] 03/30/2021 11:00 A M Medical Record Number: [id number] Patient Account Number: [id number] Date of Birth/Sex: Treating RN: 1927/12/22 (87 y.o. 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 Secondary Open Surgical Wound Wounding Event: Surgical Injury Etiology: Date Acquired: 12/06/2018 Wound Status: Open Weeks Of Treatment: 19 Comorbid Cataracts, Chronic Obstructive Pulmonary Disease (COPD), Clustered Wound: No History: Received Radiation Photos Wound Measurements Length: (cm) 6.8 Width: (cm) 3.6 Depth: (cm) 0.2 Area: (cm) 19.227 Volume: (cm) 3.845 % Reduction in Area: 19% % Reduction in Volume: 19.1% Epithelialization: Small (1-33%) Tunneling: No Undermining: No Wound Description Classification: Full Thickness With Exposed Support Structures Wound Margin: Distinct, outline attached  Exudate Amount: Medium Exudate Type: Serous Exudate Color: amber Foul Odor After Cleansing: No Slough/Fibrino Yes Wound Bed Granulation Amount: Small (1-33%) Exposed  Structure Granulation Quality: Pink, Pale Fascia Exposed: No Necrotic Amount: Large (67-100%) Fat Layer (Subcutaneous Tissue) Exposed: Yes Necrotic Quality: Adherent Slough Tendon Exposed: No Muscle Exposed: No Joint Exposed: No Bone Exposed: Yes Treatment Notes Wound #1 (Head - frontal) Cleanser Wound Cleanser Discharge Instruction: Cleanse the wound with wound cleanser prior to applying a clean dressing using gauze sponges, not tissue or cotton balls. Peri-Wound Care Topical Primary Dressing Gentell Hydrogel Silver Saturated Gauze, 4x4 (in/in) Discharge Instruction: Apply to wound bed as directed Secondary Dressing Zetuvit Plus Silicone Border Dressing 5x5 (in/in) Discharge Instruction: Apply silicone border over primary dressing as directed. Secured With Compression Wrap Compression Stockings Environmental education officer) Signed: 03/30/2021 4:58:30 PM By: Levan Hurst RN, BSN Entered By: Levan Hurst on 03/30/2021 10:44:54 -------------------------------------------------------------------------------- Wound Assessment Details Patient Name: Date of Service: [name] [name] 03/30/2021 11:00 A M Medical Record Number: [id number] Patient Account Number: [id number] Date of Birth/Sex: Treating RN: 1928-05-27 (87 y.o. [name] Primary Care Dewitt Judice: Rich Number Other Clinician: Referring Lateesha Bezold: Treating Shona Pardo/Extender: [name] in Treatment: 19 Wound Status Wound Number: 2 Primary Skin Tear Etiology: Wound Location: Left Forearm Wound Healed - Epithelialized Wounding Event: Trauma Status: Date Acquired: 01/20/2021 Comorbid Cataracts, Chronic Obstructive Pulmonary Disease (COPD), Weeks Of Treatment: 9 History: Received Radiation Clustered Wound: No Photos Wound Measurements Length: (cm) Width: (cm) Depth: (cm) Area: (cm) Volume: (cm) 0 % Reduction in Area: 100% 0 % Reduction in Volume: 100% 0  Epithelialization: Large (67-100%) 0 Tunneling: No 0 Undermining: No Wound Description Classification: Full Thickness Without Exposed Support Structures Wound Margin: Distinct, outline attached Exudate Amount: None Present Foul Odor After Cleansing: No Slough/Fibrino No Wound Bed Granulation Amount: None Present (0%) Exposed Structure Necrotic Amount: None Present (0%) Fascia Exposed: No Fat Layer (Subcutaneous Tissue) Exposed: No Tendon Exposed: No Muscle Exposed: No Joint Exposed: No Bone Exposed: No Electronic Signature(s) Signed: 03/30/2021 4:58:30 PM By: Levan Hurst RN, BSN Entered By: Levan Hurst on 03/30/2021 10:45:52 -------------------------------------------------------------------------------- [place] Details Patient Name: Date of Service: [name] [name] 03/30/2021 11:00 A M Medical Record Number: [id number] Patient Account Number: [id number] Date of Birth/Sex: Treating RN: [birth year] (87 y.o. [name] Primary Care Neal Trulson: Rich Number Other Clinician: Referring Elsi Stelzer: Treating Bernadette Gores/Extender: [name] in Treatment: 19 Vital Signs Time Taken: 10:47 Temperature (F): 97.8 Height (in): 69 Pulse (bpm): 71 Weight (lbs): 161 Respiratory Rate (breaths/min): 16 Body Mass Index (BMI): 23.8 Blood Pressure (mmHg): 174/83 Reference Range: 80 - 120 mg / dl Electronic Signature(s) Signed: 03/30/2021 4:58:30 PM By: Levan Hurst RN, BSN Entered By: Levan Hurst on 03/30/2021 10:48:09

## 2021-03-30 NOTE — Progress Notes (Signed)
Sean, [name] ([id number]) Visit Report for 03/30/2021 Chief Complaint Document Details Patient Name: Date of Service: [name] [name] 03/30/2021 11:00 A M Medical Record [name]: [id number] Patient Account [name]: [id number] Date of Birth/Sex: Treating RN: [birth year] (87 y.o. [name] Primary Care Provider: Rich [name] Other Clinician: Referring Provider: Treating Provider/Extender: [name] in Treatment: 19 Information Obtained from: Patient Chief Complaint Open wound With calvarium exposed 8/2 : left arm skin tear Electronic Signature(s) Signed: 03/30/2021 12:17:29 PM By: Sean Shan DO Entered By: [name] on 03/30/2021 12:06:56 -------------------------------------------------------------------------------- HPI Details Patient Name: Date of Service: [name], [name] [name] 03/30/2021 11:00 A M Medical Record [name]: [id number] Patient Account [name]: [id number] Date of Birth/Sex: Treating RN: December 09, 1927 (87 y.o. 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 The presents today for evaluation of hyperbaric oxygen for exposed calvarium from a previously removed squamous cell carcinoma lesion. He is currently using hydrogel to the area and keeping it covered. He takes doxycycline for chronic suppression of chronic osteomyelitis. He currently denies any signs of infection. He had Mohs surgery to remove the squamous cell carcinoma in 2019. He subsequently had radiation treatment from 02/02/2018 - 03/07/2018 to the  left forehead with 5 5.00GY in 22 fractions using 6E electrons with a clinical set up technique. He had a nonhealing wound with exposed calvarium and And a full- thickness skin graft was attempted on 12/2018. on 07/13/2019 he was evaluated by Infectious disease for evaluation of purulent drainage. 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 He was given 6 weeks of IV antibiotics and is currently on lifelong doxycycline for suppression. 6/7; patient presents for 2-week follow-up. He has been using collagen with hydrogel to the wound bed. He reports no issues with this. He denies signs of acute infection. 6/21; patient presents for 2-week follow-up. He has been using collagen with hydrogel to the wound bed. He denies acute signs of infection. He completed his chest x-ray and echocardiogram for approval of hyperbaric oxygen. At this time he would like to complete the approval process however does not want to start HBO therapy at this time. His wife is concerned about his COPD being an issue for HBO therapy and they would like more time to think about it. 8/2; patient presents for follow-up. He has been using collagen occasionally with hydrogel to the exposed calvarium. He states that the collagen sticks and at times is hard to get off. Overall he is doing well and the wound is stable. He does not want to consider HBO. Earlier today patient slipped and fell and has a skin tear to his left arm. He currently has this covered. He denies systemic signs of infection. 8/16; patient presents for 2-week follow-up. He has been using antibiotic ointment to the left arm wound with improvement to wound healing. He denies pain or signs of infection. Scalp wound is stable. 10/10; patient presents for 39-month follow-up. His left arm wound is healed. His scalp wound is stable. He has had no issues with receiving supplies. He has no issues or complaints today. He denies signs  of  infection. Electronic Signature(s) Signed: 03/30/2021 12:17:29 PM By: Sean Shan DO Entered By: [name] on 03/30/2021 12:07:25 -------------------------------------------------------------------------------- Physical Exam Details Patient Name: Date of Service: [name], [name] [name] 03/30/2021 11:00 A M Medical Record [name]: [id number] Patient Account [name]: [id number] Date of Birth/Sex: Treating RN: 11-23-1927 (87 y.o. [name] Primary Care Provider: Rich [name] Other Clinician: Referring Provider: Treating Provider/Extender: [name] in Treatment: 19 Constitutional respirations regular, non-labored and within target range for patient.Marland Kitchen Psychiatric pleasant and cooperative. Notes Exposed calvarium with yellow rigid surface. No obvious signs of infection Left upper extremity: T the forearm there is Epithelialization to previous wound o site. Electronic Signature(s) Signed: 03/30/2021 12:17:29 PM By: Sean Shan DO Entered By: [name] on 03/30/2021 12:07:56 -------------------------------------------------------------------------------- Physician Orders Details Patient Name: Date of Service: [name], [name] [name] 03/30/2021 11:00 A M Medical Record [name]: [id number] Patient Account [name]: [id number] Date of Birth/Sex: Treating RN: 05-25-1928 (87 y.o. [name] Primary Care Provider: Rich [name] Other Clinician: Referring Provider: Treating Provider/Extender: [name] in Treatment: 98 Verbal / Phone Orders: No Diagnosis Coding ICD-10 Coding Code Description S01.00XD Unspecified open wound of scalp, subsequent encounter S41.102D Unspecified open wound of left upper arm, subsequent encounter J44.9 Chronic obstructive pulmonary disease, unspecified M86.68 Other chronic osteomyelitis, other site Z85.828 Personal history of other malignant neoplasm of skin Follow-up  Appointments ppointment in: - 2 months with Dr. Heber Goose [name] Return A Bathing/ Shower/ Hygiene May shower with protection but do not get wound dressing(s) wet. Wound Treatment Wound #1 - Head - frontal Cleanser: Wound Cleanser (Generic) Every Other Day/Other:60 days Discharge Instructions: Cleanse the wound with wound cleanser prior to applying a clean dressing using gauze sponges, not tissue or cotton balls. Prim Dressing: Gentell Hydrogel Silver Saturated Gauze, 4x4 (in/in) (Generic) Every Other Day/Other:60 days ary Discharge Instructions: Apply to wound bed as directed Secondary Dressing: Zetuvit Plus Silicone Border Dressing 5x5 (in/in) (Generic) Every Other Day/Other:60 days Discharge Instructions: Apply silicone border over primary dressing as directed. Electronic Signature(s) Signed: 03/30/2021 12:17:29 PM By: Sean Shan DO Entered By: [name] on 03/30/2021 12:08:09 -------------------------------------------------------------------------------- Problem List Details Patient Name: Date of Service: [name], [name] [name] 03/30/2021 11:00 A M Medical Record [name]: [id number] Patient Account [name]: [id number] Date of Birth/Sex: Treating RN: 1927/07/03 (87 y.o. [name] Primary Care Provider: Rich [name] Other Clinician: Referring Provider: Treating Provider/Extender: [name] in Treatment: 10 Active Problems ICD-10 Encounter Code Description Active Date MDM Diagnosis S01.00XD Unspecified open wound of scalp, subsequent encounter 11/20/2020 No Yes S41.102D Unspecified open wound of left upper arm, subsequent encounter 02/03/2021 No Yes J44.9 Chronic obstructive pulmonary disease, unspecified 11/20/2020 No Yes M86.68 Other chronic osteomyelitis, other site 11/20/2020 No Yes Z85.828 Personal history of other malignant neoplasm of skin 11/20/2020 No Yes Inactive Problems ICD-10 Code Description Active Date Inactive Date S41.102A  Unspecified open wound of left upper arm, initial encounter 01/20/2021 01/20/2021 Resolved Problems Electronic Signature(s) Signed: 03/30/2021 12:17:29 PM By: Sean Shan DO Entered By: [name] on 03/30/2021 12:06:31 -------------------------------------------------------------------------------- Progress Note Details Patient Name: Date of Service: [name], [name] [name] 03/30/2021 11:00 A M Medical Record [name]: [id number] Patient Account [name]: [id number] Date of Birth/Sex: Treating RN: 07/13/1927 (87 y.o. [name] Primary Care Provider: Rich [name] Other Clinician: Referring Provider: Treating Provider/Extender: [name] in Treatment: 19 Subjective Chief Complaint Information obtained from Patient Open wound With calvarium exposed 8/2 : left arm skin  tear History of Present Illness (HPI) Mr. 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 The presents today for evaluation of hyperbaric oxygen for exposed calvarium from a previously removed squamous cell carcinoma lesion. He is currently using hydrogel to the area and keeping it covered. He takes doxycycline for chronic suppression of chronic osteomyelitis. He currently denies any signs of infection. He had Mohs surgery to remove the squamous cell carcinoma in 2019. He subsequently had radiation treatment from 02/02/2018 - 03/07/2018 to the left forehead with 5 5.00GY in 22 fractions using 6E electrons with a clinical set up technique. He had a nonhealing wound with exposed calvarium and And a full- thickness skin graft was attempted on 12/2018. on 07/13/2019 he was evaluated by Infectious disease for evaluation of purulent drainage. He was diagnosed with invasive osteomyelitis of the skull secondary to  MSSA and Serratia. He was given 6 weeks of IV antibiotics and is currently on lifelong doxycycline for suppression. 6/7; patient presents for 2-week follow-up. He has been using collagen with hydrogel to the wound bed. He reports no issues with this. He denies signs of acute infection. 6/21; patient presents for 2-week follow-up. He has been using collagen with hydrogel to the wound bed. He denies acute signs of infection. He completed his chest x-ray and echocardiogram for approval of hyperbaric oxygen. At this time he would like to complete the approval process however does not want to start HBO therapy at this time. His wife is concerned about his COPD being an issue for HBO therapy and they would like more time to think about it. 8/2; patient presents for follow-up. He has been using collagen occasionally with hydrogel to the exposed calvarium. He states that the collagen sticks and at times is hard to get off. Overall he is doing well and the wound is stable. He does not want to consider HBO. Earlier today patient slipped and fell and has a skin tear to his left arm. He currently has this covered. He denies systemic signs of infection. 8/16; patient presents for 2-week follow-up. He has been using antibiotic ointment to the left arm wound with improvement to wound healing. He denies pain or signs of infection. Scalp wound is stable. 10/10; patient presents for 64-month follow-up. His left arm wound is healed. His scalp wound is stable. He has had no issues with receiving supplies. He has no issues or complaints today. He denies signs of infection. Patient History Information obtained from Patient. Family History Cancer - Father, Heart Disease - Mother, No family history of Diabetes, Hereditary Spherocytosis, Hypertension, Kidney Disease, Lung Disease, Seizures, Stroke, Thyroid Problems, Tuberculosis. Social History Former smoker, Marital Status - Married, Alcohol Use - Rarely, Drug Use - No  History, Caffeine Use - Moderate. Medical History Eyes Patient has history of Cataracts - bil removed Denies history of Glaucoma, Optic Neuritis Ear/Nose/Mouth/Throat Denies history of Chronic sinus problems/congestion, Middle ear problems Respiratory Patient has history of Chronic Obstructive Pulmonary Disease (COPD) Denies history of Aspiration, Asthma, Pneumothorax, Sleep Apnea, Tuberculosis Gastrointestinal Denies history of Cirrhosis , Colitis, Crohnoos, Hepatitis A, Hepatitis B, Hepatitis C Endocrine Denies history of Type I Diabetes, Type II Diabetes Integumentary (Skin) Denies history of History of Burn Musculoskeletal Denies history of Gout, Rheumatoid Arthritis, Osteoarthritis, Osteomyelitis Oncologic Patient has history of Received Radiation - 20 treatments Denies history  of Received Chemotherapy Psychiatric Denies history of Anorexia/bulimia, Confinement Anxiety Hospitalization/Surgery History - Moh's procedure to scalp. - tracheotomy as child. - bilateral cataracts removed. - left hip replacement. Medical A Surgical History Notes nd Endocrine hypothyroidism Oncologic sqamous cell carcinoma of the scalp Objective Constitutional respirations regular, non-labored and within target range for patient.. Vitals Time Taken: 10:47 AM, Height: 69 in, Weight: 161 lbs, BMI: 23.8, Temperature: 97.8 F, Pulse: 71 bpm, Respiratory Rate: 16 breaths/min, Blood Pressure: 174/83 mmHg. Psychiatric pleasant and cooperative. General Notes: Exposed calvarium with yellow rigid surface. No obvious signs of infection Left upper extremity: T the forearm there is Epithelialization to o previous wound site. Integumentary (Hair, Skin) Wound #1 status is Open. Original cause of wound was Surgical Injury. The date acquired was: 12/06/2018. The wound has been in treatment 19 weeks. The wound is located on the Head - frontal. The wound measures 6.8cm length x 3.6cm width x 0.2cm depth;  19.227cm^2 area and 3.845cm^3 volume. There is bone and Fat Layer (Subcutaneous Tissue) exposed. There is no tunneling or undermining noted. There is a medium amount of serous drainage noted. The wound margin is distinct with the outline attached to the wound base. There is small (1-33%) pink, pale granulation within the wound bed. There is a large (67- 100%) amount of necrotic tissue within the wound bed including Adherent Slough. Wound #2 status is Healed - Epithelialized. Original cause of wound was Trauma. The date acquired was: 01/20/2021. The wound has been in treatment 9 weeks. The wound is located on the Left Forearm. The wound measures 0cm length x 0cm width x 0cm depth; 0cm^2 area and 0cm^3 volume. There is no tunneling or undermining noted. There is a none present amount of drainage noted. The wound margin is distinct with the outline attached to the wound base. There is no granulation within the wound bed. There is no necrotic tissue within the wound bed. Assessment Active Problems ICD-10 Unspecified open wound of scalp, subsequent encounter Unspecified open wound of left upper arm, subsequent encounter Chronic obstructive pulmonary disease, unspecified Other chronic osteomyelitis, other site Personal history of other malignant neoplasm of skin Patient has a history of chronic osteomyelitis to the calvarium. Wound is stable. No signs of infection. We will reorder supplies for patient. Left arm wound is healed. Patient follows for wound care supplies. Patient does not want to do HBO. At this time I recommended continuing hydrogel with foam border dressing. Patient knows this is a nonhealing wound. He knows to call with any questions or concerns and can be seen sooner. Plan Follow-up Appointments: Return Appointment in: - 2 months with Dr. Heber [place] Bathing/ Shower/ Hygiene: May shower with protection but do not get wound dressing(s) wet. WOUND #1: - Head - frontal Wound  Laterality: Cleanser: Wound Cleanser (Generic) Every Other Day/Other:60 days Discharge Instructions: Cleanse the wound with wound cleanser prior to applying a clean dressing using gauze sponges, not tissue or cotton balls. Prim Dressing: Gentell Hydrogel Silver Saturated Gauze, 4x4 (in/in) (Generic) Every Other Day/Other:60 days ary Discharge Instructions: Apply to wound bed as directed Secondary Dressing: Zetuvit Plus Silicone Border Dressing 5x5 (in/in) (Generic) Every Other Day/Other:60 days Discharge Instructions: Apply silicone border over primary dressing as directed. 1. Continue wound care supplies. This includes hydrogel and silicone foam border dressing 2. Follow-up in 2 months Electronic Signature(s) Signed: 03/30/2021 12:17:29 PM By: Sean Shan DO Entered By: [name] on 03/30/2021 12:09:53 -------------------------------------------------------------------------------- HxROS Details Patient Name: Date of Service: [name], JA [name]  03/30/2021 11:00 A M Medical Record [name]: [id number] Patient Account [name]: [id number] Date of Birth/Sex: Treating RN: 1928-03-28 (87 y.o. [name] Primary Care Provider: Rich [name] Other Clinician: Referring Provider: Treating Provider/Extender: [name] in Treatment: 64 Information Obtained From Patient Eyes Medical History: Positive for: Cataracts - bil removed Negative for: Glaucoma; Optic Neuritis Ear/Nose/Mouth/Throat Medical History: Negative for: Chronic sinus problems/congestion; Middle ear problems Respiratory Medical History: Positive for: Chronic Obstructive Pulmonary Disease (COPD) Negative for: Aspiration; Asthma; Pneumothorax; Sleep Apnea; Tuberculosis Gastrointestinal Medical History: Negative for: Cirrhosis ; Colitis; Crohns; Hepatitis A; Hepatitis B; Hepatitis C Endocrine Medical History: Negative for: Type I Diabetes; Type II Diabetes Past Medical  History Notes: hypothyroidism Integumentary (Skin) Medical History: Negative for: History of Burn Musculoskeletal Medical History: Negative for: Gout; Rheumatoid Arthritis; Osteoarthritis; Osteomyelitis Oncologic Medical History: Positive for: Received Radiation - 20 treatments Negative for: Received Chemotherapy Past Medical History Notes: sqamous cell carcinoma of the scalp Psychiatric Medical History: Negative for: Anorexia/bulimia; Confinement Anxiety HBO Extended History Items Eyes: Cataracts Immunizations Pneumococcal Vaccine: Received Pneumococcal Vaccination: No Implantable Devices No devices added Hospitalization / Surgery History Type of Hospitalization/Surgery Moh's procedure to scalp tracheotomy as child bilateral cataracts removed left hip replacement Family and Social History Cancer: Yes - Father; Diabetes: No; Heart Disease: Yes - Mother; Hereditary Spherocytosis: No; Hypertension: No; Kidney Disease: No; Lung Disease: No; Seizures: No; Stroke: No; Thyroid Problems: No; Tuberculosis: No; Former smoker; Marital Status - Married; Alcohol Use: Rarely; Drug Use: No History; Caffeine Use: Moderate; Financial Concerns: No; Food, Clothing or Shelter Needs: No; Support System Lacking: No; Transportation Concerns: No Electronic Signature(s) Signed: 03/30/2021 12:17:29 PM By: Sean Shan DO Signed: 03/30/2021 5:11:49 PM By: Lorrin Jackson Entered By: [name] on 03/30/2021 12:07:30 -------------------------------------------------------------------------------- SuperBill Details Patient Name: Date of Service: [name], [name] [name] 03/30/2021 Medical Record [name]: [id number] Patient Account [name]: [id number] Date of Birth/Sex: Treating RN: 04-19-1928 (87 y.o. [name] Primary Care Provider: Rich [name] Other Clinician: Referring Provider: Treating Provider/Extender: [name] in Treatment: 19 Diagnosis  Coding ICD-10 Codes Code Description S01.00XD Unspecified open wound of scalp, subsequent encounter S41.102D Unspecified open wound of left upper arm, subsequent encounter J44.9 Chronic obstructive pulmonary disease, unspecified M86.68 Other chronic osteomyelitis, other site Z85.828 Personal history of other malignant neoplasm of skin Facility Procedures CPT4 Code: [id number] Description: [id number] - WOUND CARE VISIT-LEV 3 EST PT Modifier: Quantity: 1 Physician Procedures Electronic Signature(s) Signed: 03/30/2021 12:17:29 PM By: Sean Shan DO Entered By: [name] on 03/30/2021 12:10:10

## 2021-03-31 ENCOUNTER — Encounter (HOSPITAL_BASED_OUTPATIENT_CLINIC_OR_DEPARTMENT_OTHER): Payer: Medicare Other | Admitting: Internal Medicine

## 2021-04-10 ENCOUNTER — Ambulatory Visit (INDEPENDENT_AMBULATORY_CARE_PROVIDER_SITE_OTHER): Payer: Medicare Other | Admitting: Pulmonary Disease

## 2021-04-10 ENCOUNTER — Other Ambulatory Visit: Payer: Self-pay

## 2021-04-10 ENCOUNTER — Encounter: Payer: Self-pay | Admitting: Pulmonary Disease

## 2021-04-10 VITALS — BP 140/60 | HR 82 | Temp 98.2°F | Ht 69.0 in | Wt 167.0 lb

## 2021-04-10 DIAGNOSIS — Z23 Encounter for immunization: Secondary | ICD-10-CM

## 2021-04-10 DIAGNOSIS — J9611 Chronic respiratory failure with hypoxia: Secondary | ICD-10-CM

## 2021-04-10 DIAGNOSIS — J432 Centrilobular emphysema: Secondary | ICD-10-CM

## 2021-04-10 NOTE — Patient Instructions (Signed)
High dose flu shot today  Follow up in 6 months 

## 2021-04-10 NOTE — Progress Notes (Signed)
[facility]  No chief complaint on file.  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  Past Medical History:  Hypothyroidism, HLD, Nephrolithiasis, OA, Squamous cell cancer Lt ear  Constitutional:  BP 140/60 (BP Location: Right Arm, Cuff Size: Normal)   Pulse 82   Temp 98.2 F (36.8 C)   Ht 5\' 9"  (1.753 m)   Wt 167 lb (75.8 kg)   SpO2 99%   BMI 24.66 kg/m   Brief Summary:  [name] is a 87 y.o. 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      Subjective:   Chest xray from 11/30/20 showed changes of COPD.  Not having cough, wheeze, or sputum.  His wife is still concerned he breaths through his mouth too much.  No issues with sleep.  Using oxygen at night and intermittent during the day.  Tries to do some activity around his house, but this is limited.  Physical Exam:   Appearance - well kempt, bandage over his scalp  ENMT - no sinus tenderness, no oral exudate, no LAN, Mallampati 3 airway, no stridor  Respiratory - decreased breath sounds bilaterally, no wheezing or rales  CV - s1s2 regular rate and rhythm, no murmurs  Ext - no clubbing, no edema  Skin - multiple areas of ecchymosis  Psych - normal mood and affect    Pulmonary testing:  PFT 07/14/06 >> FEV1 1.41(51%), FEV1% 60, TLC 7.3(123%), DLCO 75%, no BD  PFT 07/29/10 >> FEV1 1.30(50%), FEV1% 50, TLC 6.39(100%), DLCO 68%, no BD Spirometry 11/17/10 >> FEV1 1.15(36%), FEV1% 56 Spirometry 10/26/13 >> FEV1 0.92 (30%), FEV1% 39 Spirometry 04/07/15 >> FEV1 0.91, FEV1% 42  Chest Imaging:  CT angio chest 07/14/15 >> atherosclerosis, several nodules  Sleep Tests:  ONO with RA 11/30/12 >> Test time 9  hrs 30 min.  Mean SpO2 92.8%, low SpO2 86%.  Spent 3 min with SpO2 < 88% ONO with 2 liters 01/16/14 >> test time 8 hrs 36 min.  Basal SpO2 93%, low SpO2 85%. Spent 4 min with SpO2 < 88%.  Cardiac Tests:  Myocardial perfusion scan 03/26/19 >> ECG non diagnostic, EF 44%, no areas of ischemia Echo 12/05/20 >> EF 60 to 65%, grade 1 DD, RVSP 17.3 mmHg, mild AS  Social History:  He  reports that he quit smoking about 9 years ago. His smoking use included cigarettes. He has a 55.00 pack-year smoking history. He has never used smokeless tobacco. He reports current alcohol use. He reports that he does not use drugs.  Family History:  His family history includes Cancer in his father; Heart disease in his mother.     Assessment/Plan:   COPD with emphysema. - prednisone dependent since May 2015 - continue prednisone 2.5 mg daily >> can't tolerate dose lower than this - gets refills from the [place] - continue stiolto and asmanex - high dose flu shot today   Chronic hypoxic respiratory failure. - goal SpO2 > 90% - 2 liters at night and as needed during the day   Chronic osteomyelitis of skull. - he is now on lifelong doxycycline prophylaxis - followed by Dr. Luetta Nutting with Plastic surgery and Dr. Mariella Saa with ID at [facility]  Time Spent Involved in Patient Care on Day of Examination:  22 minutes  Follow up:   Patient Instructions  High dose flu shot today  Follow up in 6 months  Medication List:   Allergies as of 04/10/2021       Reactions   Sulfa Antibiotics    Sulfonamide Derivatives    Unknown - as a child        Medication List        Accurate as of April 10, 2021  2:43 PM. If you have any questions, ask your nurse or doctor.          albuterol 108 (90 Base) MCG/ACT inhaler Commonly known as: VENTOLIN HFA Inhale 2 puffs into the lungs every 4 (four) hours as needed for wheezing or shortness of breath.   albuterol (2.5 MG/3ML) 0.083% nebulizer solution Commonly known as:  PROVENTIL Take 3 mLs (2.5 mg total) by nebulization 3 (three) times daily.   guaiFENesin 600 MG 12 hr tablet Commonly known as: Mucinex Take 2 tablets (1,200 mg total) by mouth 2 (two) times daily as needed.   levothyroxine 50 MCG tablet Commonly known as: SYNTHROID Take 50 mcg by mouth daily before breakfast.   mometasone 220 MCG/INH inhaler Commonly known as: ASMANEX Inhale 2 puffs into the lungs daily.   Pfizer COVID-19 Vac Bivalent injection Generic drug: COVID-19 mRNA bivalent vaccine Therapist, music) Inject into the muscle.   Pfizer-BioNT COVID-19 Vac-TriS Susp injection Generic drug: COVID-19 mRNA Vac-TriS (Pfizer) Inject into the muscle.   predniSONE 5 MG tablet Commonly known as: DELTASONE TAKE 1/2 TABLET EVERY DAY FOR BREAKFAST   Tiotropium Bromide-Olodaterol 2.5-2.5 MCG/ACT Aers Commonly known as: Stiolto Respimat Inhale 2 puffs into the lungs daily.        Signature:  [name], MD [place] Pager - [phone] 04/10/2021, 2:43 PM

## 2021-05-25 ENCOUNTER — Encounter (HOSPITAL_BASED_OUTPATIENT_CLINIC_OR_DEPARTMENT_OTHER): Payer: Medicare Other | Admitting: Internal Medicine

## 2021-05-28 ENCOUNTER — Other Ambulatory Visit: Payer: Self-pay | Admitting: Pulmonary Disease

## 2021-05-28 ENCOUNTER — Encounter (HOSPITAL_BASED_OUTPATIENT_CLINIC_OR_DEPARTMENT_OTHER): Payer: Medicare Other | Attending: Internal Medicine | Admitting: Internal Medicine

## 2021-05-28 ENCOUNTER — Other Ambulatory Visit: Payer: Self-pay

## 2021-05-28 DIAGNOSIS — Z85828 Personal history of other malignant neoplasm of skin: Secondary | ICD-10-CM | POA: Insufficient documentation

## 2021-05-28 DIAGNOSIS — J9611 Chronic respiratory failure with hypoxia: Secondary | ICD-10-CM | POA: Insufficient documentation

## 2021-05-28 DIAGNOSIS — X58XXXA Exposure to other specified factors, initial encounter: Secondary | ICD-10-CM | POA: Diagnosis not present

## 2021-05-28 DIAGNOSIS — J449 Chronic obstructive pulmonary disease, unspecified: Secondary | ICD-10-CM | POA: Diagnosis not present

## 2021-05-28 DIAGNOSIS — Z9981 Dependence on supplemental oxygen: Secondary | ICD-10-CM | POA: Diagnosis not present

## 2021-05-28 DIAGNOSIS — M8668 Other chronic osteomyelitis, other site: Secondary | ICD-10-CM | POA: Insufficient documentation

## 2021-05-28 DIAGNOSIS — J441 Chronic obstructive pulmonary disease with (acute) exacerbation: Secondary | ICD-10-CM

## 2021-05-28 DIAGNOSIS — S0100XD Unspecified open wound of scalp, subsequent encounter: Secondary | ICD-10-CM | POA: Diagnosis not present

## 2021-05-28 DIAGNOSIS — S0100XA Unspecified open wound of scalp, initial encounter: Secondary | ICD-10-CM | POA: Diagnosis present

## 2021-05-28 NOTE — Progress Notes (Signed)
[name], [name] ([id number]) Visit Report for 05/28/2021 Arrival Information Details Patient Name: Date of Service: [name] [name] 05/28/2021 12:30 PM Medical Record Number: [id number] Patient Account Number: [id number] Date of Birth/Sex: Treating RN: 1928-04-21 (87 y.o. [name] Primary Care Briena Swingler: Rich Number Other Clinician: Referring Reannah Totten: Treating Shanea Karney/Extender: [name] in Treatment: 70 Visit Information History Since Last Visit Added or deleted any medications: No Patient Arrived: Ambulatory Any new allergies or adverse reactions: No Arrival Time: 12:39 Had a fall or experienced change in No Transfer Assistance: None activities of daily living that may affect Patient Identification Verified: Yes risk of falls: Secondary Verification Process Completed: Yes Signs or symptoms of abuse/neglect since last visito No Patient Requires Transmission-Based Precautions: No Hospitalized since last visit: No Patient Has Alerts: No Implantable device outside of the clinic excluding No cellular tissue based products placed in the center since last visit: Has Dressing in Place as Prescribed: Yes Pain Present Now: No Electronic Signature(s) Signed: 05/28/2021 5:07:57 PM By: Lorrin Jackson Entered By: Lorrin Jackson on 05/28/2021 12:40:02 -------------------------------------------------------------------------------- Clinic Level of Care Assessment Details Patient Name: Date of Service: [name] [name] 05/28/2021 12:30 PM Medical Record Number: [id number] Patient Account Number: [id number] Date of Birth/Sex: Treating RN: 09-Sep-1927 (87 y.o. [name] Primary Care Gladstone Rosas: Rich Number Other Clinician: Referring Kathie Posa: Treating Arleta Ostrum/Extender: [name] in Treatment: 28 Clinic Level of Care Assessment Items TOOL 4 Quantity Score X- 1 0 Use when only an EandM is performed on  FOLLOW-UP visit ASSESSMENTS - Nursing Assessment / Reassessment X- 1 10 Reassessment of Co-morbidities (includes updates in patient status) X- 1 5 Reassessment of Adherence to Treatment Plan ASSESSMENTS - Wound and Skin A ssessment / Reassessment X - Simple Wound Assessment / Reassessment - one wound 1 5 []  - 0 Complex Wound Assessment / Reassessment - multiple wounds []  - 0 Dermatologic / Skin Assessment (not related to wound area) ASSESSMENTS - Focused Assessment []  - 0 Circumferential Edema Measurements - multi extremities []  - 0 Nutritional Assessment / Counseling / Intervention []  - 0 Lower Extremity Assessment (monofilament, tuning fork, pulses) []  - 0 Peripheral Arterial Disease Assessment (using hand held doppler) ASSESSMENTS - Ostomy and/or Continence Assessment and Care []  - 0 Incontinence Assessment and Management []  - 0 Ostomy Care Assessment and Management (repouching, etc.) PROCESS - Coordination of Care []  - 0 Simple Patient / Family Education for ongoing care X- 1 20 Complex (extensive) Patient / Family Education for ongoing care []  - 0 Staff obtains Programmer, systems, Records, T Results / Process Orders est []  - 0 Staff telephones HHA, Nursing Homes / Clarify orders / etc []  - 0 Routine Transfer to another Facility (non-emergent condition) []  - 0 Routine Hospital Admission (non-emergent condition) []  - 0 New Admissions / Biomedical engineer / Ordering NPWT Apligraf, etc. , []  - 0 Emergency Hospital Admission (emergent condition) []  - 0 Simple Discharge Coordination []  - 0 Complex (extensive) Discharge Coordination PROCESS - Special Needs []  - 0 Pediatric / Minor Patient Management []  - 0 Isolation Patient Management []  - 0 Hearing / Language / Visual special needs []  - 0 Assessment of Community assistance (transportation, D/C planning, etc.) []  - 0 Additional assistance / Altered mentation []  - 0 Support Surface(s) Assessment (bed, cushion,  seat, etc.) INTERVENTIONS - Wound Cleansing / Measurement X - Simple Wound Cleansing - one wound 1 5 []  - 0 Complex Wound Cleansing - multiple wounds X- 1 5 Wound  Imaging (photographs - any number of wounds) []  - 0 Wound Tracing (instead of photographs) X- 1 5 Simple Wound Measurement - one wound []  - 0 Complex Wound Measurement - multiple wounds INTERVENTIONS - Wound Dressings []  - 0 Small Wound Dressing one or multiple wounds X- 1 15 Medium Wound Dressing one or multiple wounds []  - 0 Large Wound Dressing one or multiple wounds []  - 0 Application of Medications - topical []  - 0 Application of Medications - injection INTERVENTIONS - Miscellaneous []  - 0 External ear exam []  - 0 Specimen Collection (cultures, biopsies, blood, body fluids, etc.) []  - 0 Specimen(s) / Culture(s) sent or taken to Lab for analysis []  - 0 Patient Transfer (multiple staff / Civil Service fast streamer / Similar devices) []  - 0 Simple Staple / Suture removal (25 or less) []  - 0 Complex Staple / Suture removal (26 or more) []  - 0 Hypo / Hyperglycemic Management (close monitor of Blood Glucose) []  - 0 Ankle / Brachial Index (ABI) - do not check if billed separately X- 1 5 Vital Signs Has the patient been seen at the hospital within the last three years: Yes Total Score: 75 Level Of Care: New/Established - Level 2 Electronic Signature(s) Signed: 05/28/2021 5:07:57 PM By: Lorrin Jackson Entered By: Lorrin Jackson on 05/28/2021 12:56:34 -------------------------------------------------------------------------------- Encounter Discharge Information Details Patient Name: Date of Service: [name], [name] 05/28/2021 12:30 PM Medical Record Number: [id number] Patient Account Number: [id number] Date of Birth/Sex: Treating RN: [birth year] (87 y.o. [name] Primary Care Yared Barefoot: Rich Number Other Clinician: Referring Arleatha Philipps: Treating Jamayah Myszka/Extender: [name] in  Treatment: 28 Encounter Discharge Information Items Discharge Condition: Stable Ambulatory Status: Ambulatory Discharge Destination: Home Transportation: Private Auto Schedule Follow-up Appointment: Yes Clinical Summary of Care: Provided on 05/28/2021 Form Type Recipient Paper Patient Patient Electronic Signature(s) Signed: 05/28/2021 5:07:57 PM By: Lorrin Jackson Entered By: Lorrin Jackson on 05/28/2021 13:00:13 -------------------------------------------------------------------------------- Lower Extremity Assessment Details Patient Name: Date of Service: [name] [name] 05/28/2021 12:30 PM Medical Record Number: [id number] Patient Account Number: [id number] Date of Birth/Sex: Treating RN: Oct 14, 1927 (87 y.o. [name] Primary Care Garritt Molyneux: Rich Number Other Clinician: Referring Lanyiah Brix: Treating Alynah Schone/Extender: [name] in Treatment: 28 Electronic Signature(s) Signed: 05/28/2021 5:07:57 PM By: Lorrin Jackson Entered By: Lorrin Jackson on 05/28/2021 12:40:31 -------------------------------------------------------------------------------- Multi Wound Chart Details Patient Name: Date of Service: [name], [name] 05/28/2021 12:30 PM Medical Record Number: [id number] Patient Account Number: [id number] Date of Birth/Sex: Treating RN: Feb 26, 1928 (87 y.o. M) Primary Care Latessa Tillis: Rich Number Other Clinician: Referring Toula Miyasaki: Treating Ambre Kobayashi/Extender: [name] in Treatment: 28 Vital Signs Height(in): 69 Pulse(bpm): 101 Weight(lbs): 161 Blood Pressure(mmHg): 127/72 Body Mass Index(BMI): 24 Temperature(F): 97.9 Respiratory Rate(breaths/min): 22 Photos: [1:No Photos Head - frontal] [N/A:N/A N/A] Wound Location: [1:Surgical Injury] [N/A:N/A] Wounding Event: [1:Soft Tissue Radionecrosis] [N/A:N/A] Primary Etiology: [1:Open Surgical Wound] [N/A:N/A] Secondary Etiology: [1:Cataracts,  Chronic Obstructive] [N/A:N/A] Comorbid History: [1:Pulmonary Disease (COPD), Received Radiation 12/06/2018] [N/A:N/A] Date Acquired: [1:28] [N/A:N/A] Weeks of Treatment: [1:Open] [N/A:N/A] Wound Status: [1:6.5x4.7x0.2] [N/A:N/A] Measurements L x W x D (cm) [1:23.994] [N/A:N/A] A (cm) : rea [1:4.799] [N/A:N/A] Volume (cm) : [1:-1.00%] [N/A:N/A] % Reduction in Area: [1:-1.00%] [N/A:N/A] % Reduction in Volume: [1:Full Thickness With Exposed Support N/A] Classification: [1:Structures Medium] [N/A:N/A] Exudate Amount: [1:Serosanguineous] [N/A:N/A] Exudate Type: [1:red, brown] [N/A:N/A] Exudate Color: [1:Distinct, outline attached] [N/A:N/A] Wound Margin: [1:Small (1-33%)] [N/A:N/A] Granulation Amount: [1:Pink, Pale] [N/A:N/A] Granulation Quality: [1:Large (67-100%)] [N/A:N/A] Necrotic  Amount: [1:Fat Layer (Subcutaneous Tissue): Yes N/A] Exposed Structures: [1:Bone: Yes Fascia: No Tendon: No Muscle: No Joint: No Small (1-33%)] [N/A:N/A] Treatment Notes Electronic Signature(s) Signed: 05/28/2021 1:07:01 PM By: Kalman Shan DO Entered By: Kalman Shan on 05/28/2021 12:58:05 -------------------------------------------------------------------------------- Multi-Disciplinary Care Plan Details Patient Name: Date of Service: [name], [name] 05/28/2021 12:30 PM Medical Record Number: [id number] Patient Account Number: [id number] Date of Birth/Sex: Treating RN: 1928-02-22 (87 y.o. [name] Primary Care Dazaria Macneill: Rich Number Other Clinician: Referring Bionca Mckey: Treating Yeshaya Vath/Extender: [name] in Treatment: 28 Active Inactive Wound/Skin Impairment Nursing Diagnoses: Impaired tissue integrity Goals: Patient/caregiver will verbalize understanding of skin care regimen Date Initiated: 02/03/2021 Target Resolution Date: 06/25/2021 Goal Status: Active Interventions: Assess patient/caregiver ability to obtain necessary supplies Assess  patient/caregiver ability to perform ulcer/skin care regimen upon admission and as needed Assess ulceration(s) every visit Provide education on ulcer and skin care Notes: Electronic Signature(s) Signed: 05/28/2021 5:07:57 PM By: Lorrin Jackson Entered By: Lorrin Jackson on 05/28/2021 12:44:12 -------------------------------------------------------------------------------- Pain Assessment Details Patient Name: Date of Service: [name], [name] 05/28/2021 12:30 PM Medical Record Number: [id number] Patient Account Number: [id number] Date of Birth/Sex: Treating RN: [birth year] (87 y.o. [name] Primary Care Adon Gehlhausen: Rich Number Other Clinician: Referring Jedd Schulenburg: Treating Arlett Goold/Extender: [name] in Treatment: 28 Active Problems Location of Pain Severity and Description of Pain Patient Has Paino No Site Locations Pain Management and Medication Current Pain Management: Electronic Signature(s) Signed: 05/28/2021 5:07:57 PM By: Lorrin Jackson Entered By: Lorrin Jackson on 05/28/2021 12:40:25 -------------------------------------------------------------------------------- Patient/Caregiver Education Details Patient Name: Date of Service: [name] [name] 12/8/2022andnbsp12:30 PM Medical Record Number: [id number] Patient Account Number: [id number] Date of Birth/Gender: Treating RN: 01-30-1928 (87 y.o. [name] Primary Care Physician: Rich Number Other Clinician: Referring Physician: Treating Physician/Extender: [name] in Treatment: 28 Education Assessment Education Provided To: Patient Education Topics Provided Wound/Skin Impairment: Methods: Demonstration, Explain/Verbal, Printed Responses: State content correctly Electronic Signature(s) Signed: 05/28/2021 5:07:57 PM By: Lorrin Jackson Entered By: Lorrin Jackson on 05/28/2021  12:44:26 -------------------------------------------------------------------------------- Wound Assessment Details Patient Name: Date of Service: [name], [name] 05/28/2021 12:30 PM Medical Record Number: [id number] Patient Account Number: [id number] Date of Birth/Sex: Treating RN: 12-03-1927 (87 y.o. [name] Primary Care Evany Schecter: Rich Number Other Clinician: Referring Dilpreet Faires: Treating Aulden Calise/Extender: [name] in Treatment: 28 Wound Status Wound Number: 1 Primary Soft Tissue Radionecrosis Etiology: Wound Location: Head - frontal Secondary Open Surgical Wound Wounding Event: Surgical Injury Etiology: Date Acquired: 12/06/2018 Wound Status: Open Weeks Of Treatment: 28 Comorbid Cataracts, Chronic Obstructive Pulmonary Disease (COPD), Clustered Wound: No History: Received Radiation Photos Photo Uploaded By: Levan Hurst on 05/28/2021 16:54:57 Wound Measurements Length: (cm) 6.5 Width: (cm) 4.7 Depth: (cm) 0.2 Area: (cm) 23.994 Volume: (cm) 4.799 % Reduction in Area: -1% % Reduction in Volume: -1% Epithelialization: Small (1-33%) Tunneling: No Undermining: No Wound Description Classification: Full Thickness With Exposed Support Structures Wound Margin: Distinct, outline attached Exudate Amount: Medium Exudate Type: Serosanguineous Exudate Color: red, brown Foul Odor After Cleansing: No Slough/Fibrino Yes Wound Bed Granulation Amount: Small (1-33%) Exposed Structure Granulation Quality: Pink, Pale Fascia Exposed: No Necrotic Amount: Large (67-100%) Fat Layer (Subcutaneous Tissue) Exposed: Yes Necrotic Quality: Adherent Slough Tendon Exposed: No Muscle Exposed: No Joint Exposed: No Bone Exposed: Yes Treatment Notes Wound #1 (Head - frontal) Cleanser Wound Cleanser Discharge Instruction: Cleanse the wound with wound cleanser prior to applying a clean dressing  using gauze sponges, not tissue or cotton  balls. Peri-Wound Care Topical Primary Dressing Gentell Hydrogel Silver Saturated Gauze, 4x4 (in/in) Discharge Instruction: Apply to wound bed as directed Secondary Dressing Zetuvit Plus Silicone Border Dressing 5x5 (in/in) Discharge Instruction: Apply silicone border over primary dressing as directed. Secured With Compression Wrap Compression Stockings Environmental education officer) Signed: 05/28/2021 5:07:57 PM By: Lorrin Jackson Entered By: Lorrin Jackson on 05/28/2021 12:42:47 -------------------------------------------------------------------------------- Vitals Details Patient Name: Date of Service: [name], [name] 05/28/2021 12:30 PM Medical Record Number: [id number] Patient Account Number: [id number] Date of Birth/Sex: Treating RN: 11-19-1927 (87 y.o. [name] Primary Care Tyarra Nolton: Rich Number Other Clinician: Referring Tarynn Garling: Treating Lace Chenevert/Extender: [name] in Treatment: 28 Vital Signs Time Taken: 12:40 Temperature (F): 97.9 Height (in): 69 Pulse (bpm): 101 Weight (lbs): 161 Respiratory Rate (breaths/min): 22 Body Mass Index (BMI): 23.8 Blood Pressure (mmHg): 127/72 Reference Range: 80 - 120 mg / dl Electronic Signature(s) Signed: 05/28/2021 5:07:57 PM By: Lorrin Jackson Entered By: Lorrin Jackson on 05/28/2021 12:40:20

## 2021-05-28 NOTE — Progress Notes (Signed)
[name], [name] ([id number]) Visit Report for 05/28/2021 Chief Complaint Document Details Patient Name: Date of Service: [name] [name] 05/28/2021 12:30 PM Medical Record [name]: [id number] Patient Account [name]: [id number] Date of Birth/Sex: Treating RN: [birth year] (87 y.o. M) Primary Care Provider: Rich [name] Other Clinician: Referring Provider: Treating Provider/Extender: [name] in Treatment: 28 Information Obtained from: Patient Chief Complaint Open wound With calvarium exposed 8/2 : left arm skin tear Electronic Signature(s) Signed: 05/28/2021 1:07:01 PM By: Sean Shan DO Entered By: [name] on 05/28/2021 12:58:14 -------------------------------------------------------------------------------- HPI Details Patient Name: Date of Service: [name], [name] [name] 05/28/2021 12:30 PM Medical Record [name]: [id number] Patient Account [name]: [id number] Date of Birth/Sex: Treating RN: May 28, 1928 (87 y.o. 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 The presents today for evaluation of hyperbaric oxygen for exposed calvarium from a previously removed squamous cell carcinoma lesion. He is currently using hydrogel to the area and keeping it covered. He takes doxycycline for chronic suppression of chronic osteomyelitis. He currently denies any signs of infection. He had Mohs surgery to remove the squamous cell carcinoma in 2019. He subsequently had radiation treatment from 02/02/2018 - 03/07/2018 to the left forehead with 5 5.00GY in 22 fractions  using 6E electrons with a clinical set up technique. He had a nonhealing wound with exposed calvarium and And a full- thickness skin graft was attempted on 12/2018. on 07/13/2019 he was evaluated by Infectious disease for evaluation of purulent drainage. 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 He was given 6 weeks of IV antibiotics and is currently on lifelong doxycycline for suppression. 6/7; patient presents for 2-week follow-up. He has been using collagen with hydrogel to the wound bed. He reports no issues with this. He denies signs of acute infection. 6/21; patient presents for 2-week follow-up. He has been using collagen with hydrogel to the wound bed. He denies acute signs of infection. He completed his chest x-ray and echocardiogram for approval of hyperbaric oxygen. At this time he would like to complete the approval process however does not want to start HBO therapy at this time. His wife is concerned about his COPD being an issue for HBO therapy and they would like more time to think about it. 8/2; patient presents for follow-up. He has been using collagen occasionally with hydrogel to the exposed calvarium. He states that the collagen sticks and at times is hard to get off. Overall he is doing well and the wound is stable. He does not want to consider HBO. Earlier today patient slipped and fell and has a skin tear to his left arm. He currently has this covered. He denies systemic signs of infection. 8/16; patient presents for 2-week follow-up. He has been using antibiotic ointment to the left arm wound with improvement to wound healing. He denies pain or signs of infection. Scalp wound is stable. 10/10; patient presents for 29-month follow-up. His left arm wound is healed. His scalp wound is stable. He has had no issues with receiving supplies. He has no issues or complaints today. He denies signs of infection. 05/28/2021; patient presents for  51-month  follow-up. He has no issues or complaints today. He would like refills on his wound dressing supplies. Electronic Signature(s) Signed: 05/28/2021 1:07:01 PM By: Sean Shan DO Entered By: [name] on 05/28/2021 12:59:11 -------------------------------------------------------------------------------- Physical Exam Details Patient Name: Date of Service: [name], [name] [name] 05/28/2021 12:30 PM Medical Record [name]: [id number] Patient Account [name]: [id number] Date of Birth/Sex: Treating RN: 05-31-1928 (87 y.o. M) Primary Care Provider: Rich [name] Other Clinician: Referring Provider: Treating Provider/Extender: [name] in Treatment: 28 Constitutional respirations regular, non-labored and within target range for patient.Marland Kitchen Psychiatric pleasant and cooperative. Notes Exposed calvarium with yellow rigid surface. No obvious signs of infection Electronic Signature(s) Signed: 05/28/2021 1:07:01 PM By: Sean Shan DO Entered By: [name] on 05/28/2021 13:01:45 -------------------------------------------------------------------------------- Physician Orders Details Patient Name: Date of Service: [name], [name] [name] 05/28/2021 12:30 PM Medical Record [name]: [id number] Patient Account [name]: [id number] Date of Birth/Sex: Treating RN: 27-Dec-1927 (87 y.o. M) [name] Primary Care Provider: Rich [name] Other Clinician: Referring Provider: Treating Provider/Extender: [name] in Treatment: 28 Verbal / Phone Orders: No Diagnosis Coding ICD-10 Coding Code Description S01.00XD Unspecified open wound of scalp, subsequent encounter S41.102D Unspecified open wound of left upper arm, subsequent encounter J44.9 Chronic obstructive pulmonary disease, unspecified M86.68 Other chronic osteomyelitis, other site Z85.828 Personal history of other malignant neoplasm of skin Follow-up  Appointments ppointment in: - 2 months with Dr. Heber [name] Return A Bathing/ Shower/ Hygiene May shower with protection but do not get wound dressing(s) wet. Wound Treatment Wound #1 - Head - frontal Cleanser: Wound Cleanser (DME) (Generic) Every Other Day/Other:60 days Discharge Instructions: Cleanse the wound with wound cleanser prior to applying a clean dressing using gauze sponges, not tissue or cotton balls. Prim Dressing: Gentell Hydrogel Silver Saturated Gauze, 4x4 (in/in) (DME) (Generic) Every Other Day/Other:60 days ary Discharge Instructions: Apply to wound bed as directed Secondary Dressing: Zetuvit Plus Silicone Border Dressing 5x5 (in/in) (DME) (Generic) Every Other Day/Other:60 days Discharge Instructions: Apply silicone border over primary dressing as directed. Electronic Signature(s) Signed: 05/28/2021 1:07:01 PM By: Sean Shan DO Entered By: [name] on 05/28/2021 13:01:58 -------------------------------------------------------------------------------- Problem List Details Patient Name: Date of Service: [name], [name] [name] 05/28/2021 12:30 PM Medical Record [name]: [id number] Patient Account [name]: [id number] Date of Birth/Sex: Treating RN: 01-18-1928 (87 y.o. [name] Primary Care Provider: Rich [name] Other Clinician: Referring Provider: Treating Provider/Extender: [name] in Treatment: 28 Active Problems ICD-10 Encounter Code Description Active Date MDM Diagnosis S01.00XD Unspecified open wound of scalp, subsequent encounter 11/20/2020 No Yes J44.9 Chronic obstructive pulmonary disease, unspecified 11/20/2020 No Yes M86.68 Other chronic osteomyelitis, other site 11/20/2020 No Yes Z85.828 Personal history of other malignant neoplasm of skin 11/20/2020 No Yes Inactive Problems ICD-10 Code Description Active Date Inactive Date S41.102A Unspecified open wound of left upper arm, initial encounter 01/20/2021  01/20/2021 Resolved Problems ICD-10 Code Description Active Date Resolved Date S41.102D Unspecified open wound of left upper arm, subsequent encounter 02/03/2021 02/03/2021 Electronic Signature(s) Signed: 05/28/2021 1:07:01 PM By: Sean Shan DO Entered By: [name] on 05/28/2021 12:57:54 -------------------------------------------------------------------------------- Progress Note Details Patient Name: Date of Service: [name], [name] [name] 05/28/2021 12:30 PM Medical Record [name]: [id number] Patient Account [name]: [id number] Date of Birth/Sex: Treating RN: [birth year] (87 y.o. 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 The presents today for evaluation of hyperbaric oxygen for exposed calvarium from a previously removed squamous cell carcinoma lesion. He is currently using hydrogel to the area and keeping it covered. He takes doxycycline for chronic suppression of chronic osteomyelitis. He currently denies any signs of infection. He had Mohs surgery to remove the squamous cell carcinoma in 2019. He subsequently had radiation treatment from 02/02/2018 - 03/07/2018 to the left forehead with 5 5.00GY in 22 fractions using 6E electrons with a clinical set up technique. He had a nonhealing wound with exposed calvarium and And a full- thickness skin graft was attempted on 12/2018. on 07/13/2019 he was evaluated by Infectious disease for evaluation of purulent drainage. 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 He was given 6 weeks of IV antibiotics and is currently on lifelong doxycycline for suppression. 6/7; patient presents for 2-week follow-up. He has been using collagen with hydrogel to the wound bed. He reports no issues with this. He denies signs of acute infection. 6/21; patient presents for 2-week follow-up. He has been using collagen with hydrogel to the wound bed. He denies acute signs of infection. He completed his chest x-ray and echocardiogram for approval of hyperbaric oxygen. At this time he would like to complete the approval process however does not want to start HBO therapy at this time. His wife is concerned about his COPD being an issue for HBO therapy and they would like more time to think about it. 8/2; patient presents for follow-up. He has been using collagen occasionally with hydrogel to the exposed calvarium. He states that the collagen sticks and at times is hard to get off. Overall he is doing well and the wound is stable. He does not want to consider HBO. Earlier today patient slipped and fell and has a skin tear to his left arm. He currently has this covered. He denies systemic signs of infection. 8/16; patient presents for 2-week follow-up. He has been using antibiotic ointment to the left arm wound with improvement to wound healing. He denies pain or signs of infection. Scalp wound is stable. 10/10; patient presents for 19-month follow-up. His left arm wound is healed. His scalp wound is stable. He has had no issues with receiving supplies. He has no issues or complaints today. He denies signs of infection. 05/28/2021; patient presents for 95-month follow-up. He has no issues or complaints today. He would like refills on his wound dressing supplies. Patient History Information obtained from Patient. Family History Cancer - Father, Heart Disease - Mother, No family history of Diabetes,  Hereditary Spherocytosis, Hypertension, Kidney Disease, Lung Disease, Seizures, Stroke, Thyroid Problems, Tuberculosis. Social History Former smoker, Marital Status - Married, Alcohol Use - Rarely, Drug Use - No History, Caffeine Use - Moderate. Medical History Eyes Patient has history of Cataracts - bil removed Denies history of Glaucoma, Optic Neuritis Ear/Nose/Mouth/Throat Denies history of Chronic sinus problems/congestion, Middle ear problems Respiratory Patient has history of Chronic Obstructive Pulmonary Disease (COPD) Denies history of Aspiration, Asthma, Pneumothorax, Sleep Apnea, Tuberculosis Gastrointestinal Denies history of Cirrhosis , Colitis, Crohnoos, Hepatitis A, Hepatitis B, Hepatitis C Endocrine Denies history of Type I Diabetes, Type II Diabetes Integumentary (Skin) Denies history  of History of Burn Musculoskeletal Denies history of Gout, Rheumatoid Arthritis, Osteoarthritis, Osteomyelitis Oncologic Patient has history of Received Radiation - 20 treatments Denies history of Received Chemotherapy Psychiatric Denies history of Anorexia/bulimia, Confinement Anxiety Hospitalization/Surgery History - Moh's procedure to scalp. - tracheotomy as child. - bilateral cataracts removed. - left hip replacement. Medical A Surgical History Notes nd Endocrine hypothyroidism Oncologic sqamous cell carcinoma of the scalp Objective Constitutional respirations regular, non-labored and within target range for patient.. Vitals Time Taken: 12:40 PM, Height: 69 in, Weight: 161 lbs, BMI: 23.8, Temperature: 97.9 F, Pulse: 101 bpm, Respiratory Rate: 22 breaths/min, Blood Pressure: 127/72 mmHg. Psychiatric pleasant and cooperative. General Notes: Exposed calvarium with yellow rigid surface. No obvious signs of infection Integumentary (Hair, Skin) Wound #1 status is Open. Original cause of wound was Surgical Injury. The date acquired was: 12/06/2018. The wound has been in treatment  28 weeks. The wound is located on the Head - frontal. The wound measures 6.5cm length x 4.7cm width x 0.2cm depth; 23.994cm^2 area and 4.799cm^3 volume. There is bone and Fat Layer (Subcutaneous Tissue) exposed. There is no tunneling or undermining noted. There is a medium amount of serosanguineous drainage noted. The wound margin is distinct with the outline attached to the wound base. There is small (1-33%) pink, pale granulation within the wound bed. There is a large (67-100%) amount of necrotic tissue within the wound bed including Adherent Slough. Assessment Active Problems ICD-10 Unspecified open wound of scalp, subsequent encounter Chronic obstructive pulmonary disease, unspecified Other chronic osteomyelitis, other site Personal history of other malignant neoplasm of skin Patient presents for his usual 63-month follow-up For his chronic osteomyelitis of the calvarium. Wound is stable. Has no issues or complaints today. There are no signs of infection on exam. We will reorder supplies of hydrogel and foam border dressings. Plan Follow-up Appointments: Return Appointment in: - 2 months with Dr. Heber [place] Bathing/ Shower/ Hygiene: May shower with protection but do not get wound dressing(s) wet. WOUND #1: - Head - frontal Wound Laterality: Cleanser: Wound Cleanser (DME) (Generic) Every Other Day/Other:60 days Discharge Instructions: Cleanse the wound with wound cleanser prior to applying a clean dressing using gauze sponges, not tissue or cotton balls. Prim Dressing: Gentell Hydrogel Silver Saturated Gauze, 4x4 (in/in) (DME) (Generic) Every Other Day/Other:60 days ary Discharge Instructions: Apply to wound bed as directed Secondary Dressing: Zetuvit Plus Silicone Border Dressing 5x5 (in/in) (DME) (Generic) Every Other Day/Other:60 days Discharge Instructions: Apply silicone border over primary dressing as directed. 1. Hydrogel and foam border dressing 2. Follow-up in 2 months Electronic  Signature(s) Signed: 05/28/2021 1:07:01 PM By: Sean Shan DO Signed: 05/28/2021 1:07:01 PM By: Sean Shan DO Entered By: [name] on 05/28/2021 13:03:07 -------------------------------------------------------------------------------- HxROS Details Patient Name: Date of Service: [name], [name] [name] 05/28/2021 12:30 PM Medical Record [name]: [id number] Patient Account [name]: [id number] Date of Birth/Sex: Treating RN: [birth year] (87 y.o. M) Primary Care Provider: Rich [name] Other Clinician: Referring Provider: Treating Provider/Extender: [name] in Treatment: 28 Information Obtained From Patient Eyes Medical History: Positive for: Cataracts - bil removed Negative for: Glaucoma; Optic Neuritis Ear/Nose/Mouth/Throat Medical History: Negative for: Chronic sinus problems/congestion; Middle ear problems Respiratory Medical History: Positive for: Chronic Obstructive Pulmonary Disease (COPD) Negative for: Aspiration; Asthma; Pneumothorax; Sleep Apnea; Tuberculosis Gastrointestinal Medical History: Negative for: Cirrhosis ; Colitis; Crohns; Hepatitis A; Hepatitis B; Hepatitis C Endocrine Medical History: Negative for: Type I Diabetes; Type II Diabetes Past Medical History Notes: hypothyroidism Integumentary (Skin) Medical History: Negative for:  History of Burn Musculoskeletal Medical History: Negative for: Gout; Rheumatoid Arthritis; Osteoarthritis; Osteomyelitis Oncologic Medical History: Positive for: Received Radiation - 20 treatments Negative for: Received Chemotherapy Past Medical History Notes: sqamous cell carcinoma of the scalp Psychiatric Medical History: Negative for: Anorexia/bulimia; Confinement Anxiety HBO Extended History Items Eyes: Cataracts Immunizations Pneumococcal Vaccine: Received Pneumococcal Vaccination: No Implantable Devices No devices added Hospitalization / Surgery History Type of  Hospitalization/Surgery Moh's procedure to scalp tracheotomy as child bilateral cataracts removed left hip replacement Family and Social History Cancer: Yes - Father; Diabetes: No; Heart Disease: Yes - Mother; Hereditary Spherocytosis: No; Hypertension: No; Kidney Disease: No; Lung Disease: No; Seizures: No; Stroke: No; Thyroid Problems: No; Tuberculosis: No; Former smoker; Marital Status - Married; Alcohol Use: Rarely; Drug Use: No History; Caffeine Use: Moderate; Financial Concerns: No; Food, Clothing or Shelter Needs: No; Support System Lacking: No; Transportation Concerns: No Electronic Signature(s) Signed: 05/28/2021 1:07:01 PM By: Sean Shan DO Entered By: [name] on 05/28/2021 13:00:21 -------------------------------------------------------------------------------- SuperBill Details Patient Name: Date of Service: [name], [name] [name] 05/28/2021 Medical Record [name]: [id number] Patient Account [name]: [id number] Date of Birth/Sex: Treating RN: 1928-01-30 (87 y.o. [name] Primary Care Provider: Rich [name] Other Clinician: Referring Provider: Treating Provider/Extender: [name] in Treatment: 28 Diagnosis Coding ICD-10 Codes Code Description S01.00XD Unspecified open wound of scalp, subsequent encounter S41.102D Unspecified open wound of left upper arm, subsequent encounter J44.9 Chronic obstructive pulmonary disease, unspecified M86.68 Other chronic osteomyelitis, other site Z85.828 Personal history of other malignant neoplasm of skin Facility Procedures CPT4 Code: [id number] Description: [id number] - WOUND CARE VISIT-LEV 2 EST PT Modifier: Quantity: 1 Physician Procedures : CPT4 Code Description Modifier [id number] - WC PHYS LEVEL 3 - EST PT ICD-10 Diagnosis Description S01.00XD Unspecified open wound of scalp, subsequent encounter M86.68 Other chronic osteomyelitis, other site Z85.828 Personal history of other   malignant neoplasm of skin Quantity: 1 Electronic Signature(s) Signed: 05/28/2021 1:07:01 PM By: Sean Shan DO Entered By: [name] on 05/28/2021 13:03:21

## 2021-07-23 ENCOUNTER — Encounter (HOSPITAL_BASED_OUTPATIENT_CLINIC_OR_DEPARTMENT_OTHER): Payer: Medicare Other | Admitting: Internal Medicine

## 2021-07-30 ENCOUNTER — Encounter (HOSPITAL_BASED_OUTPATIENT_CLINIC_OR_DEPARTMENT_OTHER): Payer: Medicare Other | Attending: Internal Medicine | Admitting: Internal Medicine

## 2021-07-30 ENCOUNTER — Other Ambulatory Visit: Payer: Self-pay

## 2021-07-30 DIAGNOSIS — J449 Chronic obstructive pulmonary disease, unspecified: Secondary | ICD-10-CM | POA: Insufficient documentation

## 2021-07-30 DIAGNOSIS — X58XXXA Exposure to other specified factors, initial encounter: Secondary | ICD-10-CM | POA: Diagnosis not present

## 2021-07-30 DIAGNOSIS — Z9981 Dependence on supplemental oxygen: Secondary | ICD-10-CM | POA: Diagnosis not present

## 2021-07-30 DIAGNOSIS — Z85828 Personal history of other malignant neoplasm of skin: Secondary | ICD-10-CM | POA: Diagnosis not present

## 2021-07-30 DIAGNOSIS — J9611 Chronic respiratory failure with hypoxia: Secondary | ICD-10-CM | POA: Insufficient documentation

## 2021-07-30 DIAGNOSIS — M8668 Other chronic osteomyelitis, other site: Secondary | ICD-10-CM | POA: Insufficient documentation

## 2021-07-30 DIAGNOSIS — S0100XD Unspecified open wound of scalp, subsequent encounter: Secondary | ICD-10-CM | POA: Diagnosis not present

## 2021-07-30 DIAGNOSIS — S0100XA Unspecified open wound of scalp, initial encounter: Secondary | ICD-10-CM | POA: Diagnosis present

## 2021-07-30 NOTE — Progress Notes (Signed)
CLIFFTON, SPRADLEY ([id number]) Visit Report for 07/30/2021 Chief Complaint Document Details Patient Name: Date of Service: [name] [name] 07/30/2021 11:00 A M Medical Record Number: [id number] Patient Account Number: [id number] Date of Birth/Sex: Treating RN: 06-28-1927 (88 y.o. M) Primary Care Provider: Rich Number Other Clinician: Referring Provider: Treating Provider/Extender: [name] in Treatment: 60 Information Obtained from: Patient Chief Complaint Open wound With calvarium exposed 8/2 : left arm skin tear Electronic Signature(s) Signed: 07/30/2021 12:05:02 PM By: Kalman Shan DO Entered By: Kalman Shan on 07/30/2021 11:47:30 -------------------------------------------------------------------------------- HPI Details Patient Name: Date of Service: [name], [name] [name] 07/30/2021 11:00 A M Medical Record Number: [id number] Patient Account Number: [id number] Date of Birth/Sex: Treating RN: 15-Nov-1927 (88 y.o. M) Primary Care Provider: Rich Number Other Clinician: Referring Provider: Treating Provider/Extender: [name] in Treatment: 6 History of Present Illness HPI Description: 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 The presents today for evaluation of hyperbaric oxygen for exposed calvarium from a previously removed squamous cell carcinoma lesion. He is currently using hydrogel to the area and keeping it covered. He takes doxycycline for chronic suppression of chronic osteomyelitis. He currently denies any signs of infection. He had Mohs surgery to remove the squamous cell carcinoma in 2019. He subsequently had radiation treatment from 02/02/2018 - 03/07/2018 to the left forehead with 5 5.00GY in 22 fractions  using 6E electrons with a clinical set up technique. He had a nonhealing wound with exposed calvarium and And a full- thickness skin graft was attempted on 12/2018. on 07/13/2019 he was evaluated by Infectious disease for evaluation of purulent drainage. 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 He was given 6 weeks of IV antibiotics and is currently on lifelong doxycycline for suppression. 6/7; patient presents for 2-week follow-up. He has been using collagen with hydrogel to the wound bed. He reports no issues with this. He denies signs of acute infection. 6/21; patient presents for 2-week follow-up. He has been using collagen with hydrogel to the wound bed. He denies acute signs of infection. He completed his chest x-ray and echocardiogram for approval of hyperbaric oxygen. At this time he would like to complete the approval process however does not want to start HBO therapy at this time. His wife is concerned about his COPD being an issue for HBO therapy and they would like more time to think about it. 8/2; patient presents for follow-up. He has been using collagen occasionally with hydrogel to the exposed calvarium. He states that the collagen sticks and at times is hard to get off. Overall he is doing well and the wound is stable. He does not want to consider HBO. Earlier today patient slipped and fell and has a skin tear to his left arm. He currently has this covered. He denies systemic signs of infection. 8/16; patient presents for 2-week follow-up. He has been using antibiotic ointment to the left arm wound with improvement to wound healing. He denies pain or signs of infection. Scalp wound is stable. 10/10; patient presents for 55-month follow-up. His left arm wound is healed. His scalp wound is stable. He has had no issues with receiving supplies. He has no issues or complaints today. He denies signs of infection. 05/28/2021; patient presents for  74-month follow-up. He has no issues or complaints today. He would like refills on his wound dressing supplies. 07/30/2021; patient presents for 3-month follow-up. He needs refills on his wound supplies. He has no issues or complaints today. He states that there has been nothing new With the wound since he was last seen. Electronic Signature(s) Signed: 07/30/2021 12:05:02 PM By: Kalman Shan DO Entered By: Kalman Shan on 07/30/2021 11:48:25 -------------------------------------------------------------------------------- Physical Exam Details Patient Name: Date of Service: [name], [name] [name] 07/30/2021 11:00 A M Medical Record Number: [id number] Patient Account Number: [id number] Date of Birth/Sex: Treating RN: [birth year] (88 y.o. M) Primary Care Provider: Rich Number Other Clinician: Referring Provider: Treating Provider/Extender: [name] in Treatment: 37 Constitutional respirations regular, non-labored and within target range for patient.Marland Kitchen Psychiatric pleasant and cooperative. Notes Exposed calvarium with yellow rigid surface. No obvious signs of infection Electronic Signature(s) Signed: 07/30/2021 12:05:02 PM By: Kalman Shan DO Entered By: Kalman Shan on 07/30/2021 11:50:14 -------------------------------------------------------------------------------- Physician Orders Details Patient Name: Date of Service: [name], [name] [name] 07/30/2021 11:00 A M Medical Record Number: [id number] Patient Account Number: [id number] Date of Birth/Sex: Treating RN: [birth year] (88 y.o. Hessie Diener Primary Care Provider: Rich Number Other Clinician: Referring Provider: Treating Provider/Extender: [name] in Treatment: [phone] Verbal / Phone Orders: No Diagnosis Coding Follow-up Appointments ppointment in: - 2 months with Dr. Heber Newington Return A Bathing/ Shower/ Hygiene May shower with protection but do not get wound  dressing(s) wet. Wound Treatment Wound #1 - Head - frontal Cleanser: Wound Cleanser (DME) (Generic) Every Other Day/Other:60 days Discharge Instructions: Cleanse the wound with wound cleanser prior to applying a clean dressing using gauze sponges, not tissue or cotton balls. Prim Dressing: Gentell Hydrogel Silver Saturated Gauze, 4x4 (in/in) (DME) (Generic) Every Other Day/Other:60 days ary Discharge Instructions: Apply to wound bed as directed Secondary Dressing: ComfortFoam Border, 4x4 in (silicone border) (DME) (Generic) Every Other Day/Other:60 days Discharge Instructions: Apply over primary dressing as directed. Electronic Signature(s) Signed: 07/30/2021 12:05:02 PM By: Kalman Shan DO Entered By: Kalman Shan on 07/30/2021 11:50:36 -------------------------------------------------------------------------------- Problem List Details Patient Name: Date of Service: [name], [name] [name] 07/30/2021 11:00 A M Medical Record Number: [id number] Patient Account Number: [id number] Date of Birth/Sex: Treating RN: 1927/08/24 (88 y.o. M) Primary Care Provider: Rich Number Other Clinician: Referring Provider: Treating Provider/Extender: [name] in Treatment: 37 Active Problems ICD-10 Encounter Code Description Active Date MDM Diagnosis S01.00XD Unspecified open wound of scalp, subsequent encounter 11/20/2020 No Yes J44.9 Chronic obstructive pulmonary disease, unspecified 11/20/2020 No Yes M86.68 Other chronic osteomyelitis, other site 11/20/2020 No Yes Z85.828 Personal history of other malignant neoplasm of skin 11/20/2020 No Yes Inactive Problems ICD-10 Code Description Active Date Inactive Date S41.102A Unspecified open wound of left upper arm, initial encounter 01/20/2021 01/20/2021 Resolved Problems ICD-10 Code Description Active Date Resolved Date S41.102D Unspecified open wound of left upper arm, subsequent encounter 02/03/2021 02/03/2021 Electronic  Signature(s) Signed: 07/30/2021 12:05:02 PM By: Kalman Shan DO Entered By: Kalman Shan on 07/30/2021 11:47:09 -------------------------------------------------------------------------------- Progress Note Details Patient Name: Date of Service: [name], [name] [name] 07/30/2021 11:00 A M Medical Record Number: [id number] Patient Account Number: [id number] Date of Birth/Sex: Treating RN: Jun 05, 1928 (88 y.o. M) Primary Care Provider: Rich Number Other Clinician: Referring Provider: Treating Provider/Extender: [name] in Treatment: 37 Subjective Chief Complaint Information obtained from Patient Open wound With calvarium exposed 8/2 : left arm skin tear History of Present  Illness (HPI) Mr. 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 The presents today for evaluation of hyperbaric oxygen for exposed calvarium from a previously removed squamous cell carcinoma lesion. He is currently using hydrogel to the area and keeping it covered. He takes doxycycline for chronic suppression of chronic osteomyelitis. He currently denies any signs of infection. He had Mohs surgery to remove the squamous cell carcinoma in 2019. He subsequently had radiation treatment from 02/02/2018 - 03/07/2018 to the left forehead with 5 5.00GY in 22 fractions using 6E electrons with a clinical set up technique. He had a nonhealing wound with exposed calvarium and And a full- thickness skin graft was attempted on 12/2018. on 07/13/2019 he was evaluated by Infectious disease for evaluation of purulent drainage. 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 He was given 6 weeks of IV antibiotics and is currently on lifelong doxycycline for suppression. 6/7; patient  presents for 2-week follow-up. He has been using collagen with hydrogel to the wound bed. He reports no issues with this. He denies signs of acute infection. 6/21; patient presents for 2-week follow-up. He has been using collagen with hydrogel to the wound bed. He denies acute signs of infection. He completed his chest x-ray and echocardiogram for approval of hyperbaric oxygen. At this time he would like to complete the approval process however does not want to start HBO therapy at this time. His wife is concerned about his COPD being an issue for HBO therapy and they would like more time to think about it. 8/2; patient presents for follow-up. He has been using collagen occasionally with hydrogel to the exposed calvarium. He states that the collagen sticks and at times is hard to get off. Overall he is doing well and the wound is stable. He does not want to consider HBO. Earlier today patient slipped and fell and has a skin tear to his left arm. He currently has this covered. He denies systemic signs of infection. 8/16; patient presents for 2-week follow-up. He has been using antibiotic ointment to the left arm wound with improvement to wound healing. He denies pain or signs of infection. Scalp wound is stable. 10/10; patient presents for 34-month follow-up. His left arm wound is healed. His scalp wound is stable. He has had no issues with receiving supplies. He has no issues or complaints today. He denies signs of infection. 05/28/2021; patient presents for 7-month follow-up. He has no issues or complaints today. He would like refills on his wound dressing supplies. 07/30/2021; patient presents for 41-month follow-up. He needs refills on his wound supplies. He has no issues or complaints today. He states that there has been nothing new With the wound since he was last seen. Patient History Information obtained from Patient. Family History Cancer - Father, Heart Disease - Mother, No family history of  Diabetes, Hereditary Spherocytosis, Hypertension, Kidney Disease, Lung Disease, Seizures, Stroke, Thyroid Problems, Tuberculosis. Social History Former smoker, Marital Status - Married, Alcohol Use - Rarely, Drug Use - No History, Caffeine Use - Moderate. Medical History Eyes Patient has history of Cataracts - bil removed Denies history of Glaucoma, Optic Neuritis Ear/Nose/Mouth/Throat Denies history of Chronic sinus problems/congestion, Middle ear problems Respiratory Patient has history of Chronic Obstructive Pulmonary Disease (COPD) Denies history of Aspiration, Asthma, Pneumothorax, Sleep Apnea,  Tuberculosis Gastrointestinal Denies history of Cirrhosis , Colitis, Crohnoos, Hepatitis A, Hepatitis B, Hepatitis C Endocrine Denies history of Type I Diabetes, Type II Diabetes Integumentary (Skin) Denies history of History of Burn Musculoskeletal Denies history of Gout, Rheumatoid Arthritis, Osteoarthritis, Osteomyelitis Oncologic Patient has history of Received Radiation - 20 treatments Denies history of Received Chemotherapy Psychiatric Denies history of Anorexia/bulimia, Confinement Anxiety Hospitalization/Surgery History - Moh's procedure to scalp. - tracheotomy as child. - bilateral cataracts removed. - left hip replacement. Medical A Surgical History Notes nd Endocrine hypothyroidism Oncologic sqamous cell carcinoma of the scalp Objective Constitutional respirations regular, non-labored and within target range for patient.. Vitals Time Taken: 10:54 AM, Height: 69 in, Weight: 161 lbs, BMI: 23.8, Temperature: 97.9 F, Pulse: 69 bpm, Respiratory Rate: 20 breaths/min, Blood Pressure: 144/75 mmHg. Psychiatric pleasant and cooperative. General Notes: Exposed calvarium with yellow rigid surface. No obvious signs of infection Integumentary (Hair, Skin) Wound #1 status is Open. Original cause of wound was Surgical Injury. The date acquired was: 12/06/2018. The wound has been in  treatment 37 weeks. The wound is located on the Head - frontal. The wound measures 6.1cm length x 4.2cm width x 0.2cm depth; 20.122cm^2 area and 4.024cm^3 volume. There is bone and Fat Layer (Subcutaneous Tissue) exposed. There is no tunneling or undermining noted. There is a medium amount of serosanguineous drainage noted. The wound margin is distinct with the outline attached to the wound base. There is small (1-33%) pink, pale granulation within the wound bed. There is a large (67-100%) amount of necrotic tissue within the wound bed including Adherent Slough. Assessment Active Problems ICD-10 Unspecified open wound of scalp, subsequent encounter Chronic obstructive pulmonary disease, unspecified Other chronic osteomyelitis, other site Personal history of other malignant neoplasm of skin Patient has chronic osteo of the calvarium. He is on chronic doxycycline. Wound is stable. We will Reorder dressing supplies. Follow-up in 2 months. Plan Follow-up Appointments: Return Appointment in: - 2 months with Dr. Heber Peshtigo Bathing/ Shower/ Hygiene: May shower with protection but do not get wound dressing(s) wet. WOUND #1: - Head - frontal Wound Laterality: Cleanser: Wound Cleanser (DME) (Generic) Every Other Day/Other:60 days Discharge Instructions: Cleanse the wound with wound cleanser prior to applying a clean dressing using gauze sponges, not tissue or cotton balls. Prim Dressing: Gentell Hydrogel Silver Saturated Gauze, 4x4 (in/in) (DME) (Generic) Every Other Day/Other:60 days ary Discharge Instructions: Apply to wound bed as directed Secondary Dressing: ComfortFoam Border, 4x4 in (silicone border) (DME) (Generic) Every Other Day/Other:60 days Discharge Instructions: Apply over primary dressing as directed. 1. Reorder wound dressings 2. Follow-up in 2 months Electronic Signature(s) Signed: 07/30/2021 12:05:02 PM By: Kalman Shan DO Entered By: Kalman Shan on 07/30/2021  12:03:37 -------------------------------------------------------------------------------- HxROS Details Patient Name: Date of Service: [name], JA [name] 07/30/2021 11:00 A M Medical Record Number: [id number] Patient Account Number: [id number] Date of Birth/Sex: Treating RN: 05-16-1928 (88 y.o. M) Primary Care Provider: Rich Number Other Clinician: Referring Provider: Treating Provider/Extender: [name] in Treatment: 23 Information Obtained From Patient Eyes Medical History: Positive for: Cataracts - bil removed Negative for: Glaucoma; Optic Neuritis Ear/Nose/Mouth/Throat Medical History: Negative for: Chronic sinus problems/congestion; Middle ear problems Respiratory Medical History: Positive for: Chronic Obstructive Pulmonary Disease (COPD) Negative for: Aspiration; Asthma; Pneumothorax; Sleep Apnea; Tuberculosis Gastrointestinal Medical History: Negative for: Cirrhosis ; Colitis; Crohns; Hepatitis A; Hepatitis B; Hepatitis C Endocrine Medical History: Negative for: Type I Diabetes; Type II Diabetes Past Medical History Notes: hypothyroidism Integumentary (Skin) Medical History: Negative for:  History of Burn Musculoskeletal Medical History: Negative for: Gout; Rheumatoid Arthritis; Osteoarthritis; Osteomyelitis Oncologic Medical History: Positive for: Received Radiation - 20 treatments Negative for: Received Chemotherapy Past Medical History Notes: sqamous cell carcinoma of the scalp Psychiatric Medical History: Negative for: Anorexia/bulimia; Confinement Anxiety HBO Extended History Items Eyes: Cataracts Immunizations Pneumococcal Vaccine: Received Pneumococcal Vaccination: No Implantable Devices No devices added Hospitalization / Surgery History Type of Hospitalization/Surgery Moh's procedure to scalp tracheotomy as child bilateral cataracts removed left hip replacement Family and Social History Cancer: Yes -  Father; Diabetes: No; Heart Disease: Yes - Mother; Hereditary Spherocytosis: No; Hypertension: No; Kidney Disease: No; Lung Disease: No; Seizures: No; Stroke: No; Thyroid Problems: No; Tuberculosis: No; Former smoker; Marital Status - Married; Alcohol Use: Rarely; Drug Use: No History; Caffeine Use: Moderate; Financial Concerns: No; Food, Clothing or Shelter Needs: No; Support System Lacking: No; Transportation Concerns: No Electronic Signature(s) Signed: 07/30/2021 12:05:02 PM By: Kalman Shan DO Entered By: Kalman Shan on 07/30/2021 11:48:36 -------------------------------------------------------------------------------- SuperBill Details Patient Name: Date of Service: [name], [name] [name] 07/30/2021 Medical Record Number: [id number] Patient Account Number: [id number] Date of Birth/Sex: Treating RN: 09-05-1927 (88 y.o. Hessie Diener Primary Care Provider: Rich Number Other Clinician: Referring Provider: Treating Provider/Extender: [name] in Treatment: 37 Diagnosis Coding ICD-10 Codes Code Description S01.00XD Unspecified open wound of scalp, subsequent encounter S41.102D Unspecified open wound of left upper arm, subsequent encounter J44.9 Chronic obstructive pulmonary disease, unspecified M86.68 Other chronic osteomyelitis, other site Z85.828 Personal history of other malignant neoplasm of skin Facility Procedures CPT4 Code: [id number] Description: 99213 - WOUND CARE VISIT-LEV 3 EST PT Modifier: Quantity: 1 Physician Procedures : CPT4 Code Description Modifier [id number] - WC PHYS LEVEL 3 - EST PT ICD-10 Diagnosis Description S01.00XD Unspecified open wound of scalp, subsequent encounter M86.68 Other chronic osteomyelitis, other site Z85.828 Personal history of other  malignant neoplasm of skin Quantity: 1 Electronic Signature(s) Signed: 07/30/2021 12:05:02 PM By: Kalman Shan DO Entered By: Kalman Shan on 07/30/2021  12:04:01

## 2021-07-30 NOTE — Progress Notes (Signed)
[name], [name] ([id number]) Visit Report for 07/30/2021 Arrival Information Details Patient Name: Date of Service: [name] [name] 07/30/2021 11:00 A M Medical Record Number: [id number] Patient Account Number: [id number] Date of Birth/Sex: Treating RN: 1927-09-11 (88 y.o. Mare Ferrari Primary Care Conor Lata: Rich Number Other Clinician: Referring Khiem Gargis: Treating Grecia Lynk/Extender: [name] in Treatment: 77 Visit Information History Since Last Visit Added or deleted any medications: No Patient Arrived: Ambulatory Any new allergies or adverse reactions: No Arrival Time: 10:53 Had a fall or experienced change in No Transfer Assistance: None activities of daily living that may affect Patient Identification Verified: Yes risk of falls: Secondary Verification Process Completed: Yes Signs or symptoms of abuse/neglect since last visito No Patient Requires Transmission-Based Precautions: No Hospitalized since last visit: No Patient Has Alerts: No Has Dressing in Place as Prescribed: Yes Pain Present Now: No Electronic Signature(s) Signed: 07/30/2021 4:48:24 PM By: Sharyn Creamer RN, BSN Entered By: Sharyn Creamer on 07/30/2021 10:54:04 -------------------------------------------------------------------------------- Clinic Level of Care Assessment Details Patient Name: Date of Service: [name], [name] [name] 07/30/2021 11:00 A M Medical Record Number: [id number] Patient Account Number: [id number] Date of Birth/Sex: Treating RN: 30-Oct-1927 (88 y.o. Lorette Ang, Meta.Reding Primary Care Rodrigus Kilker: Rich Number Other Clinician: Referring Korine Winton: Treating Fawne Hughley/Extender: [name] in Treatment: 37 Clinic Level of Care Assessment Items TOOL 4 Quantity Score X- 1 0 Use when only an EandM is performed on FOLLOW-UP visit ASSESSMENTS - Nursing Assessment / Reassessment X- 1 10 Reassessment of Co-morbidities (includes updates  in patient status) X- 1 5 Reassessment of Adherence to Treatment Plan ASSESSMENTS - Wound and Skin A ssessment / Reassessment X - Simple Wound Assessment / Reassessment - one wound 1 5 []  - 0 Complex Wound Assessment / Reassessment - multiple wounds X- 1 10 Dermatologic / Skin Assessment (not related to wound area) ASSESSMENTS - Focused Assessment []  - 0 Circumferential Edema Measurements - multi extremities []  - 0 Nutritional Assessment / Counseling / Intervention []  - 0 Lower Extremity Assessment (monofilament, tuning fork, pulses) []  - 0 Peripheral Arterial Disease Assessment (using hand held doppler) ASSESSMENTS - Ostomy and/or Continence Assessment and Care []  - 0 Incontinence Assessment and Management []  - 0 Ostomy Care Assessment and Management (repouching, etc.) PROCESS - Coordination of Care X - Simple Patient / Family Education for ongoing care 1 15 []  - 0 Complex (extensive) Patient / Family Education for ongoing care X- 1 10 Staff obtains Programmer, systems, Records, T Results / Process Orders est []  - 0 Staff telephones HHA, Nursing Homes / Clarify orders / etc []  - 0 Routine Transfer to another Facility (non-emergent condition) []  - 0 Routine Hospital Admission (non-emergent condition) []  - 0 New Admissions / Biomedical engineer / Ordering NPWT Apligraf, etc. , []  - 0 Emergency Hospital Admission (emergent condition) X- 1 10 Simple Discharge Coordination []  - 0 Complex (extensive) Discharge Coordination PROCESS - Special Needs []  - 0 Pediatric / Minor Patient Management []  - 0 Isolation Patient Management []  - 0 Hearing / Language / Visual special needs []  - 0 Assessment of Community assistance (transportation, D/C planning, etc.) []  - 0 Additional assistance / Altered mentation []  - 0 Support Surface(s) Assessment (bed, cushion, seat, etc.) INTERVENTIONS - Wound Cleansing / Measurement X - Simple Wound Cleansing - one wound 1 5 []  - 0 Complex  Wound Cleansing - multiple wounds X- 1 5 Wound Imaging (photographs - any number of wounds) []  - 0 Wound Tracing (instead of  photographs) X- 1 5 Simple Wound Measurement - one wound []  - 0 Complex Wound Measurement - multiple wounds INTERVENTIONS - Wound Dressings X - Small Wound Dressing one or multiple wounds 1 10 []  - 0 Medium Wound Dressing one or multiple wounds []  - 0 Large Wound Dressing one or multiple wounds []  - 0 Application of Medications - topical []  - 0 Application of Medications - injection INTERVENTIONS - Miscellaneous []  - 0 External ear exam []  - 0 Specimen Collection (cultures, biopsies, blood, body fluids, etc.) []  - 0 Specimen(s) / Culture(s) sent or taken to Lab for analysis []  - 0 Patient Transfer (multiple staff / Civil Service fast streamer / Similar devices) []  - 0 Simple Staple / Suture removal (25 or less) []  - 0 Complex Staple / Suture removal (26 or more) []  - 0 Hypo / Hyperglycemic Management (close monitor of Blood Glucose) []  - 0 Ankle / Brachial Index (ABI) - do not check if billed separately X- 1 5 Vital Signs Has the patient been seen at the hospital within the last three years: Yes Total Score: 95 Level Of Care: New/Established - Level 3 Electronic Signature(s) Signed: 07/30/2021 5:47:56 PM By: Deon Pilling RN, BSN Entered By: Deon Pilling on 07/30/2021 11:24:15 -------------------------------------------------------------------------------- Encounter Discharge Information Details Patient Name: Date of Service: [name], [name] [name] 07/30/2021 11:00 A M Medical Record Number: [id number] Patient Account Number: [id number] Date of Birth/Sex: Treating RN: [birth year] (88 y.o. [name] Primary Care Dwayne Bulkley: Rich Number Other Clinician: Referring Shavaun Osterloh: Treating Nekeisha Aure/Extender: [name] in Treatment: 37 Encounter Discharge Information Items Discharge Condition: Stable Ambulatory Status:  Ambulatory Discharge Destination: Home Transportation: Private Auto Accompanied By: self Schedule Follow-up Appointment: Yes Clinical Summary of Care: Electronic Signature(s) Signed: 07/30/2021 5:47:56 PM By: Deon Pilling RN, BSN Entered By: Deon Pilling on 07/30/2021 11:22:00 -------------------------------------------------------------------------------- Lower Extremity Assessment Details Patient Name: Date of Service: [name] [name] 07/30/2021 11:00 A M Medical Record Number: [id number] Patient Account Number: [id number] Date of Birth/Sex: Treating RN: [birth year] (88 y.o. Mare Ferrari Primary Care Deklyn Trachtenberg: Rich Number Other Clinician: Referring Baelyn Doring: Treating Anaira Seay/Extender: [name] in Treatment: 37 Electronic Signature(s) Signed: 07/30/2021 4:48:24 PM By: Sharyn Creamer RN, BSN Entered By: Sharyn Creamer on 07/30/2021 10:56:06 -------------------------------------------------------------------------------- Multi Wound Chart Details Patient Name: Date of Service: [name], [name] [name] 07/30/2021 11:00 A M Medical Record Number: [id number] Patient Account Number: [id number] Date of Birth/Sex: Treating RN: [birth year] (88 y.o. M) Primary Care Jonerik Sliker: Rich Number Other Clinician: Referring Hubert Raatz: Treating Gianmarco Roye/Extender: [name] in Treatment: 37 Vital Signs Height(in): 69 Pulse(bpm): 5 Weight(lbs): 161 Blood Pressure(mmHg): 144/75 Body Mass Index(BMI): 23.8 Temperature(F): 97.9 Respiratory Rate(breaths/min): 20 Photos: [N/A:N/A] Head - frontal N/A N/A Wound Location: Surgical Injury N/A N/A Wounding Event: Soft Tissue Radionecrosis N/A N/A Primary Etiology: Open Surgical Wound N/A N/A Secondary Etiology: Cataracts, Chronic Obstructive N/A N/A Comorbid History: Pulmonary Disease (COPD), Received Radiation 12/06/2018 N/A N/A Date Acquired: 24 N/A N/A Weeks of  Treatment: Open N/A N/A Wound Status: No N/A N/A Wound Recurrence: 6.1x4.2x0.2 N/A N/A Measurements L x W x D (cm) 20.122 N/A N/A A (cm) : rea 4.024 N/A N/A Volume (cm) : 15.30% N/A N/A % Reduction in Area: 15.30% N/A N/A % Reduction in Volume: Full Thickness With Exposed Support N/A N/A Classification: Structures Medium N/A N/A Exudate Amount: Serosanguineous N/A N/A Exudate Type: red, brown N/A N/A Exudate Color: Distinct, outline attached N/A N/A Wound Margin: Small (1-33%) N/A  N/A Granulation Amount: Pink, Pale N/A N/A Granulation Quality: Large (67-100%) N/A N/A Necrotic Amount: Fat Layer (Subcutaneous Tissue): Yes N/A N/A Exposed Structures: Bone: Yes Fascia: No Tendon: No Muscle: No Joint: No Small (1-33%) N/A N/A Epithelialization: Treatment Notes Wound #1 (Head - frontal) Cleanser Wound Cleanser Discharge Instruction: Cleanse the wound with wound cleanser prior to applying a clean dressing using gauze sponges, not tissue or cotton balls. Peri-Wound Care Topical Primary Dressing Gentell Hydrogel Silver Saturated Gauze, 4x4 (in/in) Discharge Instruction: Apply to wound bed as directed Secondary Dressing ComfortFoam Border, 4x4 in (silicone border) Discharge Instruction: Apply over primary dressing as directed. Secured With Compression Wrap Compression Stockings Add-Ons Electronic Signature(s) Signed: 07/30/2021 12:05:02 PM By: Kalman Shan DO Entered By: Kalman Shan on 07/30/2021 11:47:18 -------------------------------------------------------------------------------- Multi-Disciplinary Care Plan Details Patient Name: Date of Service: [name], [name] [name] 07/30/2021 11:00 A M Medical Record Number: [id number] Patient Account Number: [id number] Date of Birth/Sex: Treating RN: 10-07-1927 (88 y.o. [name] Primary Care Hesper Venturella: Rich Number Other Clinician: Referring Larisa Lanius: Treating Jacoya Bauman/Extender: [name] in Treatment: 37 Active Inactive Wound/Skin Impairment Nursing Diagnoses: Impaired tissue integrity Goals: Patient/caregiver will verbalize understanding of skin care regimen Date Initiated: 02/03/2021 Target Resolution Date: 10/16/2021 Goal Status: Active Interventions: Assess patient/caregiver ability to obtain necessary supplies Assess patient/caregiver ability to perform ulcer/skin care regimen upon admission and as needed Assess ulceration(s) every visit Provide education on ulcer and skin care Notes: Electronic Signature(s) Signed: 07/30/2021 5:47:56 PM By: Deon Pilling RN, BSN Entered By: Deon Pilling on 07/30/2021 11:12:35 -------------------------------------------------------------------------------- Pain Assessment Details Patient Name: Date of Service: [name], [name] [name] 07/30/2021 11:00 A M Medical Record Number: [id number] Patient Account Number: [id number] Date of Birth/Sex: Treating RN: 11-Jan-1928 (88 y.o. Mare Ferrari Primary Care Aahna Rossa: Rich Number Other Clinician: Referring Caio Devera: Treating Kerrington Sova/Extender: [name] in Treatment: 37 Active Problems Location of Pain Severity and Description of Pain Patient Has Paino No Site Locations Pain Management and Medication Current Pain Management: Electronic Signature(s) Signed: 07/30/2021 4:48:24 PM By: Sharyn Creamer RN, BSN Entered By: Sharyn Creamer on 07/30/2021 10:55:52 -------------------------------------------------------------------------------- Patient/Caregiver Education Details Patient Name: Date of Service: [name], [name] [name] 2/9/2023andnbsp11:00 A M Medical Record Number: [id number] Patient Account Number: [id number] Date of Birth/Gender: Treating RN: 02-25-1928 (88 y.o. [name] Primary Care Physician: Rich Number Other Clinician: Referring Physician: Treating Physician/Extender: [name] in Treatment: 63 Education Assessment Education Provided To: Patient Education Topics Provided Wound/Skin Impairment: Handouts: Skin Care Do's and Dont's Methods: Explain/Verbal Responses: Reinforcements needed Electronic Signature(s) Signed: 07/30/2021 5:47:56 PM By: Deon Pilling RN, BSN Entered By: Deon Pilling on 07/30/2021 11:12:49 -------------------------------------------------------------------------------- Wound Assessment Details Patient Name: Date of Service: [name], [name] [name] 07/30/2021 11:00 A M Medical Record Number: [id number] Patient Account Number: [id number] Date of Birth/Sex: Treating RN: 1927-07-06 (88 y.o. Mare Ferrari Primary Care [name]: Rich Number Other Clinician: Referring Winni Ehrhard: Treating Pearly Bartosik/Extender: [name] in Treatment: 37 Wound Status Wound Number: 1 Primary Soft Tissue Radionecrosis Etiology: Wound Location: Head - frontal Secondary Open Surgical Wound Wounding Event: Surgical Injury Etiology: Date Acquired: 12/06/2018 Wound Status: Open Weeks Of Treatment: 37 Comorbid Cataracts, Chronic Obstructive Pulmonary Disease (COPD), Clustered Wound: No History: Received Radiation Photos Wound Measurements Length: (cm) 6.1 Width: (cm) 4.2 Depth: (cm) 0.2 Area: (cm) 20.122 Volume: (cm) 4.024 % Reduction in Area: 15.3% % Reduction in Volume: 15.3% Epithelialization: Small (1-33%) Tunneling: No Undermining: No Wound  Description Classification: Full Thickness With Exposed Support Structures Wound Margin: Distinct, outline attached Exudate Amount: Medium Exudate Type: Serosanguineous Exudate Color: red, brown Foul Odor After Cleansing: No Slough/Fibrino Yes Wound Bed Granulation Amount: Small (1-33%) Exposed Structure Granulation Quality: Pink, Pale Fascia Exposed: No Necrotic Amount: Large (67-100%) Fat Layer (Subcutaneous Tissue) Exposed: Yes Necrotic Quality: Adherent  Slough Tendon Exposed: No Muscle Exposed: No Joint Exposed: No Bone Exposed: Yes Treatment Notes Wound #1 (Head - frontal) Cleanser Wound Cleanser Discharge Instruction: Cleanse the wound with wound cleanser prior to applying a clean dressing using gauze sponges, not tissue or cotton balls. Peri-Wound Care Topical Primary Dressing Gentell Hydrogel Silver Saturated Gauze, 4x4 (in/in) Discharge Instruction: Apply to wound bed as directed Secondary Dressing ComfortFoam Border, 4x4 in (silicone border) Discharge Instruction: Apply over primary dressing as directed. Secured With Compression Wrap Compression Stockings Environmental education officer) Signed: 07/30/2021 4:48:24 PM By: Sharyn Creamer RN, BSN Entered By: Sharyn Creamer on 07/30/2021 11:01:00 -------------------------------------------------------------------------------- Vitals Details Patient Name: Date of Service: [name], [name] [name] 07/30/2021 11:00 A M Medical Record Number: [id number] Patient Account Number: [id number] Date of Birth/Sex: Treating RN: 07/20/1927 (88 y.o. Mare Ferrari Primary Care Peggi Yono: Rich Number Other Clinician: Referring Jiya Kissinger: Treating Dian Laprade/Extender: [name] in Treatment: 37 Vital Signs Time Taken: 10:54 Temperature (F): 97.9 Height (in): 69 Pulse (bpm): 69 Weight (lbs): 161 Respiratory Rate (breaths/min): 20 Body Mass Index (BMI): 23.8 Blood Pressure (mmHg): 144/75 Reference Range: 80 - 120 mg / dl Electronic Signature(s) Signed: 07/30/2021 4:48:24 PM By: Sharyn Creamer RN, BSN Entered By: Sharyn Creamer on 07/30/2021 10:55:42

## 2021-08-27 ENCOUNTER — Telehealth: Payer: Self-pay | Admitting: Pulmonary Disease

## 2021-08-27 NOTE — Telephone Encounter (Signed)
Add to last note- LMTCB with [facility] office number  ?

## 2021-08-27 NOTE — Telephone Encounter (Signed)
ATC patient to let him know that it looks like his albuterol comes from [place] and number is [phone] ? ?

## 2021-08-28 NOTE — Telephone Encounter (Signed)
I called and spoke with the pt ?He already spoke with [facility] and nothing further needed ?They are sending him albuterol neb sol refill ?

## 2021-09-24 ENCOUNTER — Encounter (HOSPITAL_BASED_OUTPATIENT_CLINIC_OR_DEPARTMENT_OTHER): Payer: Medicare Other | Admitting: Internal Medicine

## 2021-10-06 ENCOUNTER — Ambulatory Visit (INDEPENDENT_AMBULATORY_CARE_PROVIDER_SITE_OTHER): Payer: Medicare Other | Admitting: Pulmonary Disease

## 2021-10-06 ENCOUNTER — Encounter: Payer: Self-pay | Admitting: Pulmonary Disease

## 2021-10-06 VITALS — BP 130/78 | HR 51 | Temp 97.5°F | Ht 69.0 in | Wt 167.6 lb

## 2021-10-06 DIAGNOSIS — J9611 Chronic respiratory failure with hypoxia: Secondary | ICD-10-CM | POA: Diagnosis not present

## 2021-10-06 DIAGNOSIS — J432 Centrilobular emphysema: Secondary | ICD-10-CM | POA: Diagnosis not present

## 2021-10-06 NOTE — Patient Instructions (Signed)
Check your oxygen level at home when you are doing activities.  Your goal oxygen level is above 90%. ? ?Follow up in 6 months. ?

## 2021-10-06 NOTE — Progress Notes (Signed)
? ?[facility] ? ?Chief Complaint  ?Patient presents with  ? Follow-up  ?  SOB with excretio, wheezing at times  ?  ? ? 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 ? ?Past Medical History:  ?Hypothyroidism, HLD, Nephrolithiasis, OA, Squamous cell cancer Lt ear ? ?Constitutional:  ?BP 130/78 (BP Location: Left Arm, Patient Position: Sitting, Cuff Size: Normal)   Pulse (!) 51   Temp (!) 97.5 ?F (36.4 ?C) (Oral)   Ht '5\' 9"'$  (1.753 m)   Wt 167 lb 9.6 oz (76 kg)   SpO2 96%   BMI 24.75 kg/m?  ? ?Brief Summary:  ?[name] is a 88 y.o. 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 ?  ? ? ? ?Subjective:  ? ?He has noticed getting more short of breath with activity.  Not using oxygen during the day, but is using at night. ? ?Not having cough, wheeze, sputum, chest pain, or leg swelling.  Sleeping okay. ? ?Physical Exam:  ? ?Appearance - well kempt, bandage over his scalp ? ?ENMT - no sinus tenderness, no oral exudate, no LAN, Mallampati 3 airway, no stridor ? ?Respiratory - equal breath sounds bilaterally, no wheezing or rales ? ?CV - s1s2 regular rate and rhythm, no murmurs ? ?Ext - no clubbing, no edema ? ?Skin - multiple areas of ecchymosis ? ?Psych - normal mood and affect ? ? ?  ?Pulmonary testing:  ?PFT 07/14/06 >> FEV1 1.41(51%), FEV1% 60, TLC 7.3(123%), DLCO 75%, no BD  ?PFT 07/29/10 >> FEV1 1.30(50%), FEV1% 50, TLC 6.39(100%), DLCO 68%, no BD ?Spirometry 11/17/10 >> FEV1 1.15(36%), FEV1% 56 ?Spirometry 10/26/13 >> FEV1 0.92 (30%), FEV1% 39 ?Spirometry 04/07/15 >> FEV1 0.91, FEV1% 42 ? ?Chest Imaging:  ?CT angio chest 07/14/15 >> atherosclerosis, several nodules ? ?Sleep Tests:  ?ONO with RA 11/30/12  >> Test time 9 hrs 30 min.  Mean SpO2 92.8%, low SpO2 86%.  Spent 3 min with SpO2 < 88% ?ONO with 2 liters 01/16/14 >> test time 8 hrs 36 min.  Basal SpO2 93%, low SpO2 85%. Spent 4 min with SpO2 < 88%. ? ?Cardiac Tests:  ?Myocardial perfusion scan 03/26/19 >> ECG non diagnostic, EF 44%, no areas of ischemia ?Echo 12/05/20 >> EF 60 to 65%, grade 1 DD, RVSP 17.3 mmHg, mild AS ? ?Social History:  ?He  reports that he quit smoking about 10 years ago. His smoking use included cigarettes. He has a 55.00 pack-year smoking history. He has never used smokeless tobacco. He reports current alcohol use. He reports that he does not use drugs. ? ?Family History:  ?His family history includes Cancer in his father; Heart disease in his mother. ?  ? ? ?Assessment/Plan:  ? ?COPD with emphysema. ?- prednisone dependent since May 2015 ?- continue prednisone 2.5 mg daily >> can't tolerate dose lower than this ?- gets refills from the [place] ?- continue stiolto and asmnanex ?  ?Chronic hypoxic respiratory failure. ?- goal SpO2 > 90% ?- advised him to try using oxygen more during the day with exertion ?- continue 2 liters oxygen at night ?  ?Chronic osteomyelitis of skull. ?- he is now on lifelong doxycycline prophylaxis ?- followed by Dr. Luetta Nutting with Plastic surgery and Dr. Mariella Saa with ID at [facility] ? ?Time Spent Involved in Patient  Care on Day of Examination:  ?26 minutes ? ?Follow up:  ? ?There are no Patient Instructions on file for this visit. ? ?Medication List:  ? ?Allergies as of 10/06/2021   ? ?   Reactions  ? Sulfa Antibiotics   ? Sulfonamide Derivatives   ? Unknown - as a child  ? ?  ? ?  ?Medication List  ?  ? ?  ? Accurate as of October 06, 2021 11:12 AM. If you have any questions, ask your nurse or doctor.  ?  ?  ? ?  ? ?albuterol 108 (90 Base) MCG/ACT inhaler ?Commonly known as: VENTOLIN HFA ?Inhale 2 puffs into the lungs every 4 (four) hours as needed for wheezing or shortness of breath. ?  ?albuterol (2.5 MG/3ML) 0.083% nebulizer  solution ?Commonly known as: PROVENTIL ?Take 3 mLs (2.5 mg total) by nebulization 3 (three) times daily. ?  ?guaiFENesin 600 MG 12 hr tablet ?Commonly known as: Mucinex ?Take 2 tablets (1,200 mg total) by mouth 2 (two) times daily as needed. ?  ?levothyroxine 50 MCG tablet ?Commonly known as: SYNTHROID ?Take 50 mcg by mouth daily before breakfast. ?  ?mometasone 220 MCG/INH inhaler ?Commonly known as: ASMANEX ?Inhale 2 puffs into the lungs daily. ?  ?Pfizer COVID-19 Vac Bivalent injection ?Generic drug: COVID-19 mRNA bivalent vaccine [employer]) ?Inject into the muscle. ?  ?Pfizer-BioNT COVID-19 Vac-TriS Susp injection ?Generic drug: COVID-19 mRNA Vac-TriS [employer]) ?Inject into the muscle. ?  ?predniSONE 5 MG tablet ?Commonly known as: DELTASONE ?TAKE 1/2 TABLET EVERY DAY FOR BREAKFAST ?  ?Tiotropium Bromide-Olodaterol 2.5-2.5 MCG/ACT Aers ?Commonly known as: Stiolto Respimat ?Inhale 2 puffs into the lungs daily. ?  ? ?  ? ? ?Signature:  ?[name], MD ?[place] ?Pager - [phone] ?10/06/2021, 11:12 AM ?  ? ? ? ? ? ? ? ? ?

## 2021-10-08 ENCOUNTER — Encounter (HOSPITAL_BASED_OUTPATIENT_CLINIC_OR_DEPARTMENT_OTHER): Payer: Medicare Other | Attending: Internal Medicine | Admitting: Internal Medicine

## 2021-10-08 DIAGNOSIS — M8668 Other chronic osteomyelitis, other site: Secondary | ICD-10-CM | POA: Diagnosis not present

## 2021-10-08 DIAGNOSIS — Z85828 Personal history of other malignant neoplasm of skin: Secondary | ICD-10-CM

## 2021-10-08 DIAGNOSIS — S0100XD Unspecified open wound of scalp, subsequent encounter: Secondary | ICD-10-CM | POA: Diagnosis not present

## 2021-10-08 DIAGNOSIS — J449 Chronic obstructive pulmonary disease, unspecified: Secondary | ICD-10-CM | POA: Diagnosis not present

## 2021-10-08 NOTE — Progress Notes (Signed)
COLM, LYFORD ([id number]) ?Visit Report for 10/08/2021 ?Chief Complaint Document Details ?Patient Name: Date of Service: ?[name], Sean [name] 10/08/2021 10:15 A M ?Medical Record Number: [id number] ?Patient Account Number: [id number] ?Date of Birth/Sex: Treating RN: ?[birth year] (88 y.o. [name] ?Primary Care Provider: Rich Number Other Clinician: ?Referring Provider: ?Treating Provider/Extender: [name] ?Rich Number ?Weeks in Treatment: 47 ?Information Obtained from: Patient ?Chief Complaint ?Open wound With calvarium exposed ?8/2 : left arm skin tear ?Electronic Signature(s) ?Signed: 10/08/2021 1:32:24 PM By: Sean Shan DO ?Entered By: [name] on 10/08/2021 12:36:10 ?-------------------------------------------------------------------------------- ?HPI Details ?Patient Name: Date of Service: ?[name], Sean [name] 10/08/2021 10:15 A M ?Medical Record Number: [id number] ?Patient Account Number: [id number] ?Date of Birth/Sex: Treating RN: ?1927/08/01 (88 y.o. 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 The presents today for evaluation of hyperbaric oxygen for ?exposed calvarium from a previously removed squamous cell carcinoma lesion. He is currently using hydrogel to the area and keeping it covered. He takes ?doxycycline for chronic suppression of chronic osteomyelitis. He currently denies any signs of infection. ?He had Mohs surgery to remove the squamous cell carcinoma in 2019. He subsequently had radiation treatment from 02/02/2018 - 03/07/2018 to the left ?forehead  with 5 5.00GY in 22 fractions using 6E electrons with a clinical set up technique. He had a nonhealing wound with exposed calvarium and And a full- ?thickness skin graft was attempted on 12/2018. on 07/13/2019 he was evaluated by Infectious disease for evaluation of purulent drainage. 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 He was given 6 weeks of IV antibiotics and is currently on lifelong doxycycline for ?suppression. ?6/7; patient presents for 2-week follow-up. He has been using collagen with hydrogel to the wound bed. He reports no issues with this. He denies signs of acute ?infection. ?6/21; patient presents for 2-week follow-up. He has been using collagen with hydrogel to the wound bed. He denies acute signs of infection. He completed his ?chest x-ray and echocardiogram for approval of hyperbaric oxygen. At this time he would like to complete the approval process however does not want to start ?HBO therapy at this time. His wife is concerned about his COPD being an issue for HBO therapy and they would like more time to think about it. ?8/2; patient presents for follow-up. He has been using collagen occasionally with hydrogel to the exposed calvarium. He states that the collagen sticks and at ?times is hard to get off. Overall he is doing well and the wound is stable. He does not want to consider HBO. ?Earlier today patient slipped and fell and has a skin tear to his left arm. He currently has this covered. He denies systemic signs of infection. ?8/16; patient presents for 2-week follow-up. He has been using antibiotic ointment to the left arm wound with improvement to wound healing. He denies pain or ?signs of infection. Scalp wound is stable. ?10/10; patient presents for 25-monthfollow-up. His left arm wound is healed. His scalp wound is stable. He has had no issues with receiving supplies. He has no ?issues or complaints today. He denies signs of  infection. ?05/28/2021; patient presents for 29-monthfollow-up. He has no issues or complaints today. He would like refills on his wound dressing supplies. ?07/30/2021; patient presents for 25-monthollow-up. He needs refills on his wound supplies. He has no issues or complaints today. He states that there has been ?nothing new With the wound since he was last seen. ?4/20; patient presents for follow-up. He has no issues or complaints today. He sees dermatology next month for follow-up. He would like a refill on his wound ?care supplies. ?Electronic Signature(s) ?Signed: 10/08/2021 1:32:24 PM By: Sean ShanO ?Entered By: [name] 10/08/2021 12:36:47 ?-------------------------------------------------------------------------------- ?Physical Exam Details ?Patient Name: Date of Service: ?BRFredderick SeveranceJA [name] 10/08/2021 10:15 A M ?Medical Record Number: [id number] Account Number: [id number] of Birth/Sex: Treating RN: ?[birth year]319116.o. M)Sean GrammesPrimary Care Provider: KuRich Numberther Clinician: ?Referring Provider: ?Treating Provider/Extender: Sean ShanKuRich NumberWeeks in Treatment: 47 ?Constitutional ?respirations regular, non-labored and within target range for patient.. Marland KitchenPsychiatric ?pleasant and cooperative. ?Notes ?Exposed calvarium with yellow rigid surface. No obvious signs of infection. T the Medial border there is granulation tissue that is bulging. ?o ?Electronic Signature(s) ?Signed: 10/08/2021 1:32:24 PM By: Sean ShanO ?Entered By: [name] 10/08/2021 12:37:48 ?-------------------------------------------------------------------------------- ?Physician Orders Details ?Patient Name: Date of Service: ?BRFredderick SeveranceJA [name] 10/08/2021 10:15 A M ?Medical Record Number: [id number] Account Number: [id number] of Birth/Sex: Treating RN: ?[birth year]319175.o. M)Sean GrammesPrimary Care Provider: KuRich Numberther  Clinician: ?Referring Provider: ?Treating Provider/Extender: Sean ShanKuRich NumberWeeks in Treatment: 47 ?Verbal / Phone Orders: No ?Diagnosis Coding ?ICD-10 Coding ?Code Description ?S01.00XD Unspecified open wound of scalp, subsequent encounter ?J44.9 Chronic obstructive pulmonary disease, unspecified ?M86.68 Other chronic osteomyelitis, other site ?Z85.828 Personal history of other malignant neoplasm of skin ?Wound Treatment ?Electronic Signature(s) ?Signed: 10/08/2021 1:32:24 PM By: Sean ShanO ?Entered By: [name] 10/08/2021 12:38:00 ?-------------------------------------------------------------------------------- ?Problem List Details ?Patient Name: ?Date of Service: ?BRFredderick SeveranceJA [name] 10/08/2021 10:15 A M ?Medical Record Number: [id number] Account Number: [id number] of Birth/Sex: ?Treating RN: ?[id number].o. M)Sean GrammesPrimary Care Provider: KuRich NumberOther Clinician: ?Referring Provider: ?Treating Provider/Extender: Sean ShanKuRich NumberWeeks in Treatment: 47 ?Active Problems ?ICD-10 ?Encounter ?Code Description Active Date MDM ?Diagnosis ?S01.00XD Unspecified open wound of scalp, subsequent encounter 11/20/2020 No Yes ?J44.9 Chronic obstructive pulmonary disease, unspecified 11/20/2020 No Yes ?M86.68 Other chronic osteomyelitis, other site 11/20/2020 No Yes ?Z8Y86.578ersonal history of other malignant neoplasm of skin 11/20/2020 No Yes ?Inactive Problems ?ICD-10 ?Code Description Active Date Inactive Date ?S41.102A Unspecified open wound of left upper arm, initial encounter 01/20/2021 01/20/2021 ?Resolved Problems ?ICD-10 ?Code Description Active Date Resolved Date ?S41.102D Unspecified open wound of left upper arm, subsequent encounter 02/03/2021 02/03/2021 ?Electronic Signature(s) ?Signed: 10/08/2021 1:32:24 PM By: Sean ShanO ?Entered By: [name] 10/08/2021  12:35:49 ?-------------------------------------------------------------------------------- ?Progress Note Details ?Patient Name: ?Date of Service: ?BRFredderick SeveranceJA [name] 10/08/2021 10:15 A M ?Medical Record Number: [id number] Account Number: [id number] of Birth/Sex: ?Treating R

## 2021-10-08 NOTE — Progress Notes (Signed)
Sean, [name] ([id number]) ?Visit Report for 10/08/2021 ?Arrival Information Details ?Patient Name: Date of Service: ?[name], Sean [name] 10/08/2021 10:15 A M ?Medical Record Number: [id number] ?Patient Account Number: [id number] ?Date of Birth/Sex: Treating RN: ?03-13-1928 (88 y.o. [name], [name] ?Primary Care Khloee Garza: Rich Number Other Clinician: ?Referring Willean Schurman: ?Treating Ivelis Norgard/Extender: Kalman Shan ?Rich Number ?Weeks in Treatment: 47 ?Visit Information History Since Last Visit ?Added or deleted any medications: No ?Patient Arrived: Ambulatory ?Any new allergies or adverse reactions: No ?Arrival Time: 10:22 ?Had a fall or experienced change in No ?Accompanied By: self ?activities of daily living that may affect ?Transfer Assistance: None ?risk of falls: ?Patient Identification Verified: Yes ?Signs or symptoms of abuse/neglect since last visito No ?Secondary Verification Process Completed: Yes ?Hospitalized since last visit: No ?Patient Requires Transmission-Based Precautions: No ?Implantable device outside of the clinic excluding No ?Patient Has Alerts: No ?cellular tissue based products placed in the center ?since last visit: ?Has Dressing in Place as Prescribed: Yes ?Pain Present Now: No ?Electronic Signature(s) ?Signed: 10/08/2021 5:27:50 PM By: Rhae Hammock RN ?Entered By: Rhae Hammock on 10/08/2021 10:24:56 ?-------------------------------------------------------------------------------- ?Lower Extremity Assessment Details ?Patient Name: Date of Service: ?[name], Sean [name] 10/08/2021 10:15 A M ?Medical Record Number: [id number] ?Patient Account Number: [id number] ?Date of Birth/Sex: Treating RN: ?[birth year] (88 y.o. [name], [name] ?Primary Care Dquan Cortopassi: Rich Number Other Clinician: ?Referring Whittley Carandang: ?Treating Tirso Laws/Extender: Kalman Shan ?Rich Number ?Weeks in Treatment: 47 ?Electronic Signature(s) ?Signed: 10/08/2021 5:27:50 PM By: Rhae Hammock  RN ?Entered By: Rhae Hammock on 10/08/2021 10:34:16 ?-------------------------------------------------------------------------------- ?Multi Wound Chart Details ?Patient Name: Date of Service: ?[name], Sean [name] 10/08/2021 10:15 A M ?Medical Record Number: [id number] ?Patient Account Number: [id number] ?Date of Birth/Sex: Treating RN: ?12-11-1927 (88 y.o. [name] ?Primary Care Arnoldo Hildreth: Rich Number Other Clinician: ?Referring Arjen Deringer: ?Treating Shakara Tweedy/Extender: Kalman Shan ?Rich Number ?Weeks in Treatment: 47 ?Vital Signs ?Height(in): 69 ?Pulse(bpm): 74 ?Weight(lbs): 161 ?Blood Pressure(mmHg): 153/70 ?Body Mass Index(BMI): 23.8 ?Temperature(??F): 97.7 ?Respiratory Rate(breaths/min): 17 ?Photos: [N/A:N/A] ?Head - frontal N/A N/A ?Wound Location: ?Surgical Injury N/A N/A ?Wounding Event: ?Soft Tissue Radionecrosis N/A N/A ?Primary Etiology: ?Open Surgical Wound N/A N/A ?Secondary Etiology: ?Cataracts, Chronic Obstructive N/A N/A ?Comorbid History: ?Pulmonary Disease (COPD), Received ?Radiation ?12/06/2018 N/A N/A ?Date Acquired: ?42 N/A N/A ?Weeks of Treatment: ?Open N/A N/A ?Wound Status: ?No N/A N/A ?Wound Recurrence: ?6.5x5.2x0.2 N/A N/A ?Measurements L x W x D (cm) ?26.546 N/A N/A ?A (cm?) : ?rea ?5.309 N/A N/A ?Volume (cm?) : ?-11.80% N/A N/A ?% Reduction in Area: ?-11.80% N/A N/A ?% Reduction in Volume: ?Full Thickness With Exposed Support N/A N/A ?Classification: ?Structures ?Medium N/A N/A ?Exudate Amount: ?Serosanguineous N/A N/A ?Exudate Type: ?red, brown N/A N/A ?Exudate Color: ?Yes N/A N/A ?Foul Odor A Cleansing: ?fter ?No N/A N/A ?Odor Anticipated Due to Product ?Use: ?Distinct, outline attached N/A N/A ?Wound Margin: ?Small (1-33%) N/A N/A ?Granulation A mount: ?Pink, Pale N/A N/A ?Granulation Quality: ?Large (67-100%) N/A N/A ?Necrotic Amount: ?Fat Layer (Subcutaneous Tissue): Yes N/A N/A ?Exposed Structures: ?Bone: Yes ?Fascia: No ?Tendon: No ?Muscle: No ?Joint: No ?Small  (1-33%) N/A N/A ?Epithelialization: ?Treatment Notes ?Electronic Signature(s) ?Signed: 10/08/2021 1:32:24 PM By: Kalman Shan DO ?Signed: 10/08/2021 4:55:52 PM By: Lorrin Jackson ?Entered By: Kalman Shan on 10/08/2021 12:35:54 ?-------------------------------------------------------------------------------- ?Pain Assessment Details ?Patient Name: ?Date of Service: ?[name], Sean [name] 10/08/2021 10:15 A M ?Medical Record Number: [id number] ?Patient Account Number: [id number] ?Date of Birth/Sex: ?Treating RN: ?1927/07/23 (88 y.o. [name], [name] ?Primary Care  Dajai Wahlert: Rich Number ?Other Clinician: ?Referring Khrista Braun: ?Treating Kahner Yanik/Extender: Kalman Shan ?Rich Number ?Weeks in Treatment: 47 ?Active Problems ?Location of Pain Severity and Description of Pain ?Patient Has Paino No ?Site Locations ?Pain Management and Medication ?Current Pain Management: ?Electronic Signature(s) ?Signed: 10/08/2021 5:27:50 PM By: Rhae Hammock RN ?Entered By: Rhae Hammock on 10/08/2021 10:34:04 ?-------------------------------------------------------------------------------- ?Wound Assessment Details ?Patient Name: ?Date of Service: ?[name], Sean [name] 10/08/2021 10:15 A M ?Medical Record Number: [id number] ?Patient Account Number: [id number] ?Date of Birth/Sex: ?Treating RN: ?Sep 24, 1927 (88 y.o. [name], [name] ?Primary Care Sula Fetterly: Rich Number ?Other Clinician: ?Referring Clatie Kessen: ?Treating Nashawn Hillock/Extender: Kalman Shan ?Rich Number ?Weeks in Treatment: 47 ?Wound Status ?Wound Number: 1 Primary Soft Tissue Radionecrosis ?Etiology: ?Wound Location: Head - frontal ?Secondary Open Surgical Wound ?Wounding Event: Surgical Injury ?Etiology: ?Date Acquired: 12/06/2018 ?Wound Status: Open ?Weeks Of Treatment: 47 ?Comorbid Cataracts, Chronic Obstructive Pulmonary Disease (COPD), ?Clustered Wound: No ?History: Received Radiation ?Photos ?Wound Measurements ?Length: (cm) 6.5 ?Width: (cm)  5.2 ?Depth: (cm) 0.2 ?Area: (cm?) 26.546 ?Volume: (cm?) 5.309 ?% Reduction in Area: -11.8% ?% Reduction in Volume: -11.8% ?Epithelialization: Small (1-33%) ?Tunneling: No ?Undermining: No ?Wound Description ?Classification: Full Thickness With Exposed Support Structures ?Wound Margin: Distinct, outline attached ?Exudate Amount: Medium ?Exudate Type: Serosanguineous ?Exudate Color: red, brown ?Foul Odor After Cleansing: Yes ?Due to Product Use: No ?Slough/Fibrino Yes ?Wound Bed ?Granulation Amount: Small (1-33%) Exposed Structure ?Granulation Quality: Pink, Pale ?Fascia Exposed: No ?Necrotic Amount: Large (67-100%) ?Fat Layer (Subcutaneous Tissue) Exposed: Yes ?Necrotic Quality: Adherent Slough ?Tendon Exposed: No ?Muscle Exposed: No ?Joint Exposed: No ?Bone Exposed: Yes ?Electronic Signature(s) ?Signed: 10/08/2021 5:27:50 PM By: Rhae Hammock RN ?Entered By: Rhae Hammock on 10/08/2021 10:31:16 ?-------------------------------------------------------------------------------- ?Vitals Details ?Patient Name: ?Date of Service: ?[name], Sean [name] 10/08/2021 10:15 A M ?Medical Record Number: [id number] ?Patient Account Number: [id number] ?Date of Birth/Sex: ?Treating RN: ?[birth year] (88 y.o. [name], [name] ?Primary Care Alyviah Crandle: Rich Number ?Other Clinician: ?Referring Tameisha Covell: ?Treating Derrisha Foos/Extender: Kalman Shan ?Rich Number ?Weeks in Treatment: 47 ?Vital Signs ?Time Taken: 10:25 ?Temperature (??F): 97.7 ?Height (in): 69 ?Pulse (bpm): 74 ?Weight (lbs): 161 ?Respiratory Rate (breaths/min): 17 ?Body Mass Index (BMI): 23.8 ?Blood Pressure (mmHg): 153/70 ?Reference Range: 80 - 120 mg / dl ?Electronic Signature(s) ?Signed: 10/08/2021 5:27:50 PM By: Rhae Hammock RN ?Entered By: Rhae Hammock on 10/08/2021 10:25:14 ?

## 2021-10-16 ENCOUNTER — Telehealth: Payer: Self-pay | Admitting: Pulmonary Disease

## 2021-10-16 MED ORDER — LEVALBUTEROL HCL 0.63 MG/3ML IN NEBU: 0.6300 mg | INHALATION_SOLUTION | Freq: Four times a day (QID) | RESPIRATORY_TRACT | 12 refills | Status: AC | PRN

## 2021-10-16 NOTE — Telephone Encounter (Signed)
pt states his dme([facility]) cant access/receive his medication-albuterol due to it being on backorder. pt is asking that a different medicaton is called in, he is currently not getting hs full dosage in and trying to ration until a medication is called in ? ? ?Pharmacy/dme : [facility] ?

## 2021-10-16 NOTE — Telephone Encounter (Signed)
Please send script for xopenex 0.63 mg q6h prn. ?

## 2021-10-16 NOTE — Telephone Encounter (Signed)
Spoke with the pt and notified of response per Dr Halford Chessman. Pt verbalized understanding. Rx was sent to pharm. Nothing further needed.  ?

## 2021-10-16 NOTE — Telephone Encounter (Addendum)
Spoke with the pt  ?He is almost out of albuterol neb sol  ?His pharmacy is unsure when they are getting a shipment  ?He is asking if we can call in a different neb sol to last until they get albuterol  ?He does not want to try another pharm  ?Please advise thanks ?

## 2021-10-20 ENCOUNTER — Other Ambulatory Visit (HOSPITAL_COMMUNITY): Payer: Self-pay

## 2021-10-20 ENCOUNTER — Telehealth: Payer: Self-pay | Admitting: Pulmonary Disease

## 2021-10-20 DIAGNOSIS — J439 Emphysema, unspecified: Secondary | ICD-10-CM

## 2021-10-20 NOTE — Telephone Encounter (Signed)
There are no alternatives to levalbuterol or albuterol nebs for SABA. Have sent message to prior auth team regarding PA but might have to go through Part B and may not be covered if insurance prefers albuterol. ? ?Patient does not seem to have rx coverage so HFAs not an option. ? ?I called pharmacy - Medicare does not cover enough of the cost for levalbuterol so [facility] does not dispense Xopenex at all - Xopenex would be too expensive through insurance anyways. Per rep, they have had albuterol back order x 6 weeks and don't know when they will get.  ? ?Can try to send rx for albuterol nebs to local pharmacy like [facility], [facility], [facility], in the hopes that they have something in stock. Will need to send dx code with rx to ensure Part B can be billed. Left VM on patient's phone requesting call back to clinic to determine best pharmacy for him ? ?Knox Saliva, PharmD, MPH, BCPS, CPP ?Clinical Pharmacist (Rheumatology and Pulmonology) ?

## 2021-10-20 NOTE — Telephone Encounter (Signed)
[name], MD Just now (2:35 PM)  ? ?He has been using albuterol nebulizer medication, but this is on national backorder and he wasn't able to get this.  At his last visit I sent an order for levalbuterol nebulizer medication to take the place of albuterol.  Received message from his pharmacy that they can't dispense levalbuterol nebulizer medication because of his insurance.  Can you please have the pharmacy team review whether there are alternatives to albuterol or levalbuterol nebulizer therapy that he can get.  ?  ? ?

## 2021-10-20 NOTE — Telephone Encounter (Signed)
Pt call in wanting to discuss his Rx that was sent to [facility] for albuterol. Pt said the pharmacy sent a list of alternatives, because the albuterol is unavailable and he?s wanting to know if any were picked to replace his medication. Pt is requesting a call back his number is [phone], or [phone] ?

## 2021-10-20 NOTE — Telephone Encounter (Signed)
He has been using albuterol nebulizer medication, but this is on national backorder and he wasn't able to get this.  At his last visit I sent an order for levalbuterol nebulizer medication to take the place of albuterol.  Received message from his pharmacy that they can't dispense levalbuterol nebulizer medication because of his insurance.  Can you please have the pharmacy team review whether there are alternatives to albuterol or levalbuterol nebulizer therapy that he can get. ?

## 2021-10-20 NOTE — Telephone Encounter (Deleted)
Pt call in wanting to discuss his Rx that was sent to [facility] for albuterol. Pt said the pharmacy sent a list of alternatives, because the albuterol is unavailable and he?s wanting to know if any where picked to replace his medication. Pt is requesting a call back his number is [phone], or [phone] ?

## 2021-10-21 ENCOUNTER — Other Ambulatory Visit (HOSPITAL_COMMUNITY): Payer: Self-pay

## 2021-10-21 ENCOUNTER — Telehealth: Payer: Self-pay | Admitting: Pharmacist

## 2021-10-21 MED ORDER — ALBUTEROL SULFATE (2.5 MG/3ML) 0.083% IN NEBU
2.5000 mg | INHALATION_SOLUTION | Freq: Three times a day (TID) | RESPIRATORY_TRACT | 10 refills | Status: AC
Start: 2021-10-21 — End: ?

## 2021-10-21 NOTE — Telephone Encounter (Signed)
[facility]bs [facility] [address] with dx code. Called patient's wife to advise that they can try calling pharmacy to see if they have in stock and fill ? ?Knox Saliva, PharmD, MPH, BCPS, CPP ?Clinical Pharmacist (Rheumatology and Pulmonology) ?

## 2021-10-21 NOTE — Telephone Encounter (Signed)
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 ? ?Can you check if duoneb is an option he can get also. ?

## 2021-10-21 NOTE — Telephone Encounter (Signed)
Duonebs are also on more significant backorder across all pharmacies. ? ?Knox Saliva, PharmD, MPH, BCPS, CPP ?Clinical Pharmacist (Rheumatology and Pulmonology) ?

## 2021-10-21 NOTE — Telephone Encounter (Signed)
Be advised.

## 2021-10-22 NOTE — Telephone Encounter (Signed)
Rx for albuterol nebs [facility] [address] with dx code yesterday, 10/21/21. Called patient's wife to advise that they can try calling pharmacy to see if they have in stock and fill ? ?Knox Saliva, PharmD, MPH, BCPS, CPP ?Clinical Pharmacist (Rheumatology and Pulmonology) ?

## 2021-10-22 NOTE — Telephone Encounter (Signed)
[facility] for [facility] nebs [facility] [address] with dx code. Called patient's wife to advise that they can try calling pharmacy to see if they have in stock and fill ?

## 2021-11-05 ENCOUNTER — Other Ambulatory Visit: Payer: Self-pay | Admitting: Pulmonary Disease

## 2021-11-05 DIAGNOSIS — J441 Chronic obstructive pulmonary disease with (acute) exacerbation: Secondary | ICD-10-CM

## 2021-11-21 ENCOUNTER — Other Ambulatory Visit: Payer: Self-pay

## 2021-11-21 ENCOUNTER — Emergency Department (HOSPITAL_COMMUNITY): Payer: Medicare Other

## 2021-11-21 ENCOUNTER — Encounter (HOSPITAL_COMMUNITY): Payer: Self-pay

## 2021-11-21 ENCOUNTER — Emergency Department (HOSPITAL_COMMUNITY)
Admission: EM | Admit: 2021-11-21 | Discharge: 2021-11-21 | Disposition: A | Discharge status: Home / Self Care | Payer: Medicare Other | Attending: Emergency Medicine | Admitting: Emergency Medicine

## 2021-11-21 DIAGNOSIS — Z79899 Other long term (current) drug therapy: Secondary | ICD-10-CM | POA: Insufficient documentation

## 2021-11-21 DIAGNOSIS — Z96642 Presence of left artificial hip joint: Secondary | ICD-10-CM | POA: Diagnosis not present

## 2021-11-21 DIAGNOSIS — M25551 Pain in right hip: Secondary | ICD-10-CM | POA: Insufficient documentation

## 2021-11-21 DIAGNOSIS — Z85828 Personal history of other malignant neoplasm of skin: Secondary | ICD-10-CM | POA: Insufficient documentation

## 2021-11-21 DIAGNOSIS — M5431 Sciatica, right side: Secondary | ICD-10-CM | POA: Diagnosis not present

## 2021-11-21 DIAGNOSIS — M79604 Pain in right leg: Secondary | ICD-10-CM | POA: Diagnosis not present

## 2021-11-21 MED ORDER — KETOROLAC TROMETHAMINE 15 MG/ML IJ SOLN
15.0000 mg | Freq: Once | INTRAMUSCULAR | Status: AC
Start: 2021-11-21 — End: 2021-11-21
  Administered 2021-11-21: 15 mg via INTRAMUSCULAR
  Filled 2021-11-21: qty 1

## 2021-11-21 MED ORDER — NAPROXEN 375 MG PO TABS: 375.0000 mg | ORAL_TABLET | Freq: Two times a day (BID) | ORAL | 0 refills | Status: AC

## 2021-11-21 MED ORDER — LIDOCAINE 5 % EX PTCH
1.0000 | MEDICATED_PATCH | CUTANEOUS | 0 refills | Status: AC
Start: 2021-11-21 — End: ?

## 2021-11-21 NOTE — ED Provider Notes (Signed)
[facility] DEPT Provider Note   CSN: [id number] Arrival date & time: 11/21/21  0856     History Chief Complaint  Patient presents with   Leg Pain    [name] is a 88 y.o. 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  He describes as a sharp shooting sensation.  Denies any trauma or injury to the hip.  No history of hip surgeries on the right.  Does have a total left hip arthroplasty.  Denies any weakness or numbness to the legs, bowel or bladder incontinence.  No fever or chills.   Leg Pain     Home Medications Prior to Admission medications   Medication Sig Start Date End Date Taking? Authorizing Provider  lidocaine (LIDODERM) 5 % Place 1 patch onto the skin daily. Remove & Discard patch within 12 hours or as directed by MD 11/21/21  Yes Raul Del, Cortasia Screws M, PA-C  naproxen (NAPROSYN) 375 MG tablet Take 1 tablet (375 mg total) by mouth 2 (two) times daily. 11/21/21  Yes Arvo Ealy M, PA-C  albuterol (PROVENTIL HFA;VENTOLIN HFA) 108 (90 BASE) MCG/ACT inhaler Inhale 2 puffs into the lungs every 4 (four) hours as needed for wheezing or shortness of breath.    [provider]  albuterol (PROVENTIL) (2.5 MG/3ML) 0.083% nebulizer solution Take 3 mLs (2.5 mg total) by nebulization 3 (three) times daily. J43.9 10/21/21   [name], MD  COVID-19 mRNA bivalent vaccine, Pfizer, (PFIZER COVID-19 VAC BIVALENT) injection Inject into the muscle. 03/23/21   Carlyle Basques, MD  COVID-19 mRNA Vac-TriS, Pfizer, (PFIZER-BIONT COVID-19 VAC-TRIS) SUSP injection Inject into the muscle. 11/27/20   Carlyle Basques, MD  guaiFENesin (MUCINEX) 600 MG 12 hr tablet Take 2 tablets (1,200 mg total) by mouth 2 (two) times daily as needed. 07/29/16   [name], MD  levalbuterol  Penne Lash) 0.63 MG/3ML nebulizer solution Take 3 mLs (0.63 mg total) by nebulization every 6 (six) hours as needed for wheezing or shortness of breath. 10/16/21   [name], MD  levothyroxine (SYNTHROID, LEVOTHROID) 50 MCG tablet Take 50 mcg by mouth daily before breakfast.     [provider]  mometasone (ASMANEX) 220 MCG/INH inhaler Inhale 2 puffs into the lungs daily.    [provider]  predniSONE (DELTASONE) 5 MG tablet TAKE 1/2 TABLET EVERY DAY FOR BREAKFAST 11/05/21   [name], MD  Tiotropium Bromide-Olodaterol (STIOLTO RESPIMAT) 2.5-2.5 MCG/ACT AERS Inhale 2 puffs into the lungs daily. 06/07/18   Martyn Ehrich, NP      Allergies    Sulfa antibiotics and Sulfonamide derivatives    Review of Systems   Review of Systems  All other systems reviewed and are negative.  Physical Exam Updated Vital Signs BP (!) 159/62   Pulse 60   Temp 97.8 F (36.6 C) (Oral)   Resp 18   Ht '5\' 9"'$  (1.753 m)   Wt 75.8 kg   SpO2 99%   BMI 24.66 kg/m  Physical Exam Vitals and nursing note reviewed.  Constitutional:      Appearance: Normal appearance.  HENT:     Head: Normocephalic and atraumatic.  Eyes:     General:        Right eye: No discharge.        Left eye: No discharge.  Conjunctiva/sclera: Conjunctivae normal.  Pulmonary:     Effort: Pulmonary effort is normal.  Musculoskeletal:     Comments: There is tenderness to the right paralumbar musculature in addition to the right hip.  5/5 strength to the lower extremities.  Normal sensation to the lower extremities.  He does have positive straight leg raise on the right.  Skin:    General: Skin is warm and dry.     Findings: No rash.  Neurological:     General: No focal deficit present.     Mental Status: He is alert.  Psychiatric:        Mood and Affect: Mood normal.        Behavior: Behavior normal.    ED Results / Procedures / Treatments   Labs (all labs ordered are listed, but only abnormal results  are displayed) Labs Reviewed - No data to display  EKG None  Radiology DG Lumbar Spine Complete  Result Date: 11/21/2021 CLINICAL DATA:  Pain. EXAM: LUMBAR SPINE - COMPLETE 4+ VIEW COMPARISON:  None Available. FINDINGS: Mild curvature of the lumbar spine, apex to the left. No other malalignment. No fractures. Multilevel degenerative disc disease is at least moderate severity. Lower lumbar facet degenerative changes. Calcified atherosclerotic change is identified in the abdominal aorta and proximal iliac vessels. IMPRESSION: 1. No fracture or traumatic malalignment in the lumbar spine. Significant degenerative disc disease and lower lumbar facet degenerative changes. 2. Calcified atherosclerotic changes in the abdominal aorta and proximal iliac vessels. Electronically Signed   By: Dorise Bullion III M.D.   On: 11/21/2021 09:46   DG Hip Unilat W or Wo Pelvis 2-3 Views Right  Result Date: 11/21/2021 CLINICAL DATA:  88 year old male with right leg pain radiating distally. EXAM: DG HIP (WITH OR WITHOUT PELVIS) 2-3V RIGHT COMPARISON:  None Available. FINDINGS: Left total hip arthroplasty. Visible left hip hardware appears intact and aligned. No pelvis fracture identified. Bone mineralization is within normal limits for age. SI joints appear symmetric. The proximal right femur appears intact. No acute osseous abnormality identified. Partially visible lumbar spine degeneration. Calcified peripheral vascular disease. Nonobstructed visible bowel gas. IMPRESSION: No acute osseous abnormality identified about the right hip or pelvis. Left total hip arthroplasty partially visible. Electronically Signed   By: Genevie Ann M.D.   On: 11/21/2021 09:45    Procedures Procedures    Medications Ordered in ED Medications  ketorolac (TORADOL) 15 MG/ML injection 15 mg (15 mg Intramuscular Given 11/21/21 1962)    ED Course/ Medical Decision Making/ A&P Clinical Course as of 11/21/21 1034  Sat Nov 21, 2021  1027 On  reevaluation, patient states he is feeling better after Toradol.  Wife is at the bedside.  I discussed the findings of the imaging with patient and spouse at bedside.  I also explained the etiology of sciatica to the spouse.  I discussed with him that the plan will be to discharge him with anti-inflammatories, steroids, and lidocaine patches and will have him follow-up with his PCP.  They were amenable this plan.  Patient is in safe condition for discharge at this time. Strict return precautions were given.  [CF]    Clinical Course User Index [CF] Hendricks Limes, PA-C                           Medical Decision Making ONIS MARKOFF is a 88 y.o. male who presents to the emergency department today for further evaluation of right  hip pain.  This does seem to be consistent with sciatica on the right.  Patient does not have a history of sciatica.  We discussed the etiologies and symptoms of sciatica patient states that he had most of the symptoms.  Given his history of invasive SCC I will get x-rays of the right hip and lumbar spine to ensure there is no recurrence or metastasis.  We will give him pain medication in the meantime.  He is in no acute distress right now and resting comfortably in the ER.   Amount and/or Complexity of Data Reviewed External Data Reviewed: labs and notes.    Details: Patient had normal creatinine function as of 11/18/2021. Radiology: ordered and independent interpretation performed.    Details: I personally ordered and interpreted imaging of the right hip and lumbar spine.  No evidence of fractures or dislocations.  Spine is in good alignment.  I do agree with the radiologist interpretation.  Risk Prescription drug management.    Final Clinical Impression(s) / ED Diagnoses Final diagnoses:  Sciatica of right side    Rx / DC Orders ED Discharge Orders          Ordered    naproxen (NAPROSYN) 375 MG tablet  2 times daily        11/21/21 1033    lidocaine  (LIDODERM) 5 %  Every 24 hours        11/21/21 1033              Myna Bright [place], [place] 11/21/21 1034    Milton Ferguson, MD 11/21/21 1550

## 2021-11-21 NOTE — Discharge Instructions (Addendum)
I have given you 2 prescriptions.  Anti-inflammatories and lidocaine patches.  I have reviewed your home medication list looks like you are already on steroids.  Please continue taking as prescribed.  I would like for you to follow-up with your primary care doctor in 1 week to ensure we are going in the right direction.  Please return to the emergency department for any worsening symptoms.

## 2021-11-21 NOTE — ED Triage Notes (Signed)
[facility] reports pt coming from home c/o right leg pain that shoots all the way down his leg. Never had this pain before.

## 2021-12-03 ENCOUNTER — Encounter (HOSPITAL_BASED_OUTPATIENT_CLINIC_OR_DEPARTMENT_OTHER): Payer: Medicare Other | Attending: Internal Medicine | Admitting: Internal Medicine

## 2021-12-03 DIAGNOSIS — S0100XD Unspecified open wound of scalp, subsequent encounter: Secondary | ICD-10-CM

## 2021-12-03 DIAGNOSIS — J449 Chronic obstructive pulmonary disease, unspecified: Secondary | ICD-10-CM | POA: Diagnosis not present

## 2021-12-03 DIAGNOSIS — S41102A Unspecified open wound of left upper arm, initial encounter: Secondary | ICD-10-CM | POA: Insufficient documentation

## 2021-12-03 DIAGNOSIS — X58XXXA Exposure to other specified factors, initial encounter: Secondary | ICD-10-CM | POA: Diagnosis not present

## 2021-12-03 DIAGNOSIS — M8668 Other chronic osteomyelitis, other site: Secondary | ICD-10-CM

## 2021-12-03 DIAGNOSIS — Z85828 Personal history of other malignant neoplasm of skin: Secondary | ICD-10-CM

## 2021-12-03 DIAGNOSIS — W010XXA Fall on same level from slipping, tripping and stumbling without subsequent striking against object, initial encounter: Secondary | ICD-10-CM | POA: Insufficient documentation

## 2021-12-03 NOTE — Progress Notes (Signed)
TRAYE, BATES ([id number]) Visit Report for 12/03/2021 Chief Complaint Document Details Patient Name: Date of Service: [name] [name] 12/03/2021 9:30 A M Medical Record Number: [id number] Patient Account Number: [id number] Date of Birth/Sex: Treating RN: 04/20/1928 (88 y.o. [name] Primary Care Provider: Rich Number Other Clinician: Referring Provider: Treating Provider/Extender: [name] in Treatment: 70 Information Obtained from: Patient Chief Complaint Open wound With calvarium exposed 8/2 : left arm skin tear Electronic Signature(s) Signed: 12/03/2021 1:32:28 PM By: Kalman Shan DO Entered By: Kalman Shan on 12/03/2021 12:06:35 -------------------------------------------------------------------------------- HPI Details Patient Name: Date of Service: [name], [name] 12/03/2021 9:30 A M Medical Record Number: [id number] Patient Account Number: [id number] Date of Birth/Sex: Treating RN: 1927-11-19 (88 y.o. [name] Primary Care Provider: Rich Number Other Clinician: Referring Provider: Treating Provider/Extender: [name] in Treatment: 63 History of Present Illness HPI Description: Mr. 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 The presents today for evaluation of hyperbaric oxygen for exposed calvarium from a previously removed squamous cell carcinoma lesion. He is currently using hydrogel to the area and keeping it covered. He takes doxycycline for chronic suppression of chronic osteomyelitis. He currently denies any signs of infection. He had Mohs surgery to remove the squamous cell carcinoma in 2019. He subsequently had radiation treatment from 02/02/2018 - 03/07/2018 to the left forehead  with 5 5.00GY in 22 fractions using 6E electrons with a clinical set up technique. He had a nonhealing wound with exposed calvarium and And a full- thickness skin graft was attempted on 12/2018. on 07/13/2019 he was evaluated by Infectious disease for evaluation of purulent drainage. 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 He was given 6 weeks of IV antibiotics and is currently on lifelong doxycycline for suppression. 6/7; patient presents for 2-week follow-up. He has been using collagen with hydrogel to the wound bed. He reports no issues with this. He denies signs of acute infection. 6/21; patient presents for 2-week follow-up. He has been using collagen with hydrogel to the wound bed. He denies acute signs of infection. He completed his chest x-ray and echocardiogram for approval of hyperbaric oxygen. At this time he would like to complete the approval process however does not want to start HBO therapy at this time. His wife is concerned about his COPD being an issue for HBO therapy and they would like more time to think about it. 8/2; patient presents for follow-up. He has been using collagen occasionally with hydrogel to the exposed calvarium. He states that the collagen sticks and at times is hard to get off. Overall he is doing well and the wound is stable. He does not want to consider HBO. Earlier today patient slipped and fell and has a skin tear to his left arm. He currently has this covered. He denies systemic signs of infection. 8/16; patient presents for 2-week follow-up. He has been using antibiotic ointment to the left arm wound with improvement to wound healing. He denies pain or signs of infection. Scalp wound is stable. 10/10; patient presents for 25-monthfollow-up. His left arm wound is healed. His scalp wound is stable. He has had no issues with receiving supplies. He has no issues or complaints today. He denies signs of  infection. 05/28/2021; patient presents for 56-monthfollow-up. He has no issues or complaints today. He would like refills on his wound dressing supplies. 07/30/2021; patient presents for 267-monthollow-up. He needs refills on his wound supplies. He has no issues or complaints today. He states that there has been nothing new With the wound since he was last seen. 4/20; patient presents for follow-up. He has no issues or complaints today. He sees dermatology next month for follow-up. He would like a refill on his wound care supplies. 6/15; patient presents for follow-up. He saw dermatology on 5/15. According to the patient he has a new lesion confirmed squamous cell carcinoma to the posterior scalp. He saw plastic surgery on 6/1 for the scalp wound and diagnosed the new overgrowth as atypical fibroxanthoma. Patient was given an option of just wound care versus local excision which would be removal of the desiccated portion of the school without reconstruction. Patient has not decided what he would like to do. He is currently using hydrogel with foam border dressings. Electronic Signature(s) Signed: 12/03/2021 1:32:28 PM By: HoKalman ShanO Entered By: HoKalman Shann 12/03/2021 12:20:18 -------------------------------------------------------------------------------- Physical Exam Details Patient Name: Date of Service: BRFredderick SeveranceJAGreggory BrandyES E. 12/03/2021 9:30 A M Medical Record Number: [id number] Account Number: [id number] [id number] Birth/Sex: Treating RN: 01/1928/05/139185.o. M)Marcheta Grammesrimary Care Provider: KuRich Numberther Clinician: Referring Provider: Treating Provider/Extender: HoClinton Quantn Treatment: 5551onstitutional respirations regular, non-labored and within target range for patient.. Marland Kitchensychiatric pleasant and cooperative. Notes Exposed calvarium with yellow rigid surface. No obvious signs of infection. T the Medial border there is  atypical fibroxanthoma tissue that is bulging. o Electronic Signature(s) Signed: 12/03/2021 1:32:28 PM By: HoKalman ShanO Entered By: HoKalman Shann 12/03/2021 12:20:55 -------------------------------------------------------------------------------- Physician Orders Details Patient Name: Date of Service: BRFredderick SeveranceJAGreggory BrandyES E. 12/03/2021 9:30 A M Medical Record Number: [id number] Account Number: [id number] of Birth/Sex: Treating RN: [id number].o. M)Marcheta Grammesrimary Care Provider: KuRich Numberther Clinician: Referring Provider: Treating Provider/Extender: HoClinton Quantn Treatment: 5522erbal / Phone Orders: No Diagnosis Coding ICD-10 Coding Code Description S01.00XD Unspecified open wound of scalp, subsequent encounter J44.9 Chronic obstructive pulmonary disease, unspecified M86.68 Other chronic osteomyelitis, other site Z85.828 Personal history of other malignant neoplasm of skin Follow-up Appointments Return appointment in 1 month. - with Dr. HoHeber Carolinand JoLeveda AnnaRN (Room 7) 01/07/22 @ 9:30am Bathing/ Shower/ Hygiene May shower with protection but do not get wound dressing(s) wet. Wound Treatment Wound #1 - Head - frontal Cleanser: Wound Cleanser (Generic) Every Other Day/Other:60 days Discharge Instructions: Cleanse the wound with wound cleanser prior to applying a clean dressing using gauze sponges, not tissue or cotton balls. Prim Dressing: Gentell Hydrogel Silver Saturated Gauze, 4x4 (in/in) (Generic) Every Other Day/Other:60 days ary Discharge Instructions: Apply to wound bed as directed Secondary Dressing: Woven Gauze Sponge, Non-Sterile 4x4 in (DME) (Generic) Every Other Day/Other:60 days Discharge Instructions: Apply over primary dressing as directed. Secondary Dressing: Zetuvit Plus Silicone Border Dressing 5x5 (in/in) (DME) (Generic) Every Other Day/Other:60 days Discharge Instructions: Apply silicone border  over primary dressing as directed. Secured With: 65M Medipore SoPublic affairs consultanturgical T 2x10 (in/yd) (DME) (Generic) Every Other Day/Other:60 days ape Discharge Instructions: Secure with tape as directed. Electronic Signature(s) Signed: 12/03/2021 1:32:28 PM By: HoKalman ShanO Entered By: HoKalman Shann 12/03/2021 12:21:02 -------------------------------------------------------------------------------- Problem List Details Patient Name: Date of Service: BRFredderick SeveranceJA [name]  12/03/2021 9:30 A M Medical Record Number: [id number] Patient Account Number: [id number] Date of Birth/Sex: Treating RN: [birth year] (88 y.o. [name] Primary Care Provider: Rich Number Other Clinician: Referring Provider: Treating Provider/Extender: [name] in Treatment: 60 Active Problems ICD-10 Encounter Code Description Active Date MDM Diagnosis S01.00XD Unspecified open wound of scalp, subsequent encounter 11/20/2020 No Yes J44.9 Chronic obstructive pulmonary disease, unspecified 11/20/2020 No Yes M86.68 Other chronic osteomyelitis, other site 11/20/2020 No Yes Z85.828 Personal history of other malignant neoplasm of skin 11/20/2020 No Yes Inactive Problems ICD-10 Code Description Active Date Inactive Date S41.102A Unspecified open wound of left upper arm, initial encounter 01/20/2021 01/20/2021 Resolved Problems ICD-10 Code Description Active Date Resolved Date S41.102D Unspecified open wound of left upper arm, subsequent encounter 02/03/2021 02/03/2021 Electronic Signature(s) Signed: 12/03/2021 1:32:28 PM By: Kalman Shan DO Entered By: Kalman Shan on 12/03/2021 12:06:20 -------------------------------------------------------------------------------- Progress Note Details Patient Name: Date of Service: [name], [name] 12/03/2021 9:30 A M Medical Record Number: [id number] Patient Account Number: [id number] Date of Birth/Sex: Treating RN: 02-Nov-1927 (88  y.o. [name] Primary Care Provider: Rich Number Other Clinician: Referring Provider: Treating Provider/Extender: [name] in Treatment: 31 Subjective Chief Complaint Information obtained from Patient Open wound With calvarium exposed 8/2 : left arm skin tear History of Present Illness (HPI) Mr. 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 The presents today for evaluation of hyperbaric oxygen for exposed calvarium from a previously removed squamous cell carcinoma lesion. He is currently using hydrogel to the area and keeping it covered. He takes doxycycline for chronic suppression of chronic osteomyelitis. He currently denies any signs of infection. He had Mohs surgery to remove the squamous cell carcinoma in 2019. He subsequently had radiation treatment from 02/02/2018 - 03/07/2018 to the left forehead with 5 5.00GY in 22 fractions using 6E electrons with a clinical set up technique. He had a nonhealing wound with exposed calvarium and And a full- thickness skin graft was attempted on 12/2018. on 07/13/2019 he was evaluated by Infectious disease for evaluation of purulent drainage. 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 He was given 6 weeks of IV antibiotics and is currently on lifelong doxycycline for suppression. 6/7; patient presents for 2-week follow-up. He has been using collagen with hydrogel to the wound bed. He reports no issues with this. He denies signs of acute infection. 6/21; patient presents for 2-week follow-up. He has been using collagen with hydrogel to the wound bed. He denies acute signs of infection. He completed his chest x-ray and echocardiogram for approval of hyperbaric oxygen. At this time he  would like to complete the approval process however does not want to start HBO therapy at this time. His wife is concerned about his COPD being an issue for HBO therapy and they would like more time to think about it. 8/2; patient presents for follow-up. He has been using collagen occasionally with hydrogel to the exposed calvarium. He states that the collagen sticks and at times is hard to get off. Overall he is doing well and the wound is stable. He does not want to consider HBO. Earlier today patient slipped and fell and has a skin tear to his left arm. He currently has this covered. He denies systemic  signs of infection. 8/16; patient presents for 2-week follow-up. He has been using antibiotic ointment to the left arm wound with improvement to wound healing. He denies pain or signs of infection. Scalp wound is stable. 10/10; patient presents for 66-monthfollow-up. His left arm wound is healed. His scalp wound is stable. He has had no issues with receiving supplies. He has no issues or complaints today. He denies signs of infection. 05/28/2021; patient presents for 275-monthollow-up. He has no issues or complaints today. He would like refills on his wound dressing supplies. 07/30/2021; patient presents for 2-8-monthllow-up. He needs refills on his wound supplies. He has no issues or complaints today. He states that there has been nothing new With the wound since he was last seen. 4/20; patient presents for follow-up. He has no issues or complaints today. He sees dermatology next month for follow-up. He would like a refill on his wound care supplies. 6/15; patient presents for follow-up. He saw dermatology on 5/15. According to the patient he has a new lesion confirmed squamous cell carcinoma to the posterior scalp. He saw plastic surgery on 6/1 for the scalp wound and diagnosed the new overgrowth as atypical fibroxanthoma. Patient was given an option of just wound care versus local excision which  would be removal of the desiccated portion of the school without reconstruction. Patient has not decided what he would like to do. He is currently using hydrogel with foam border dressings. Patient History Information obtained from Patient. Family History Cancer - Father, Heart Disease - Mother, No family history of Diabetes, Hereditary Spherocytosis, Hypertension, Kidney Disease, Lung Disease, Seizures, Stroke, Thyroid Problems, Tuberculosis. Social History Former smoker, Marital Status - Married, Alcohol Use - Rarely, Drug Use - No History, Caffeine Use - Moderate. Medical History Eyes Patient has history of Cataracts - bil removed Denies history of Glaucoma, Optic Neuritis Ear/Nose/Mouth/Throat Denies history of Chronic sinus problems/congestion, Middle ear problems Respiratory Patient has history of Chronic Obstructive Pulmonary Disease (COPD) Denies history of Aspiration, Asthma, Pneumothorax, Sleep Apnea, Tuberculosis Gastrointestinal Denies history of Cirrhosis , Colitis, Crohnoos, Hepatitis A, Hepatitis B, Hepatitis C Endocrine Denies history of Type I Diabetes, Type II Diabetes Integumentary (Skin) Denies history of History of Burn Musculoskeletal Denies history of Gout, Rheumatoid Arthritis, Osteoarthritis, Osteomyelitis Oncologic Patient has history of Received Radiation - 20 treatments Denies history of Received Chemotherapy Psychiatric Denies history of Anorexia/bulimia, Confinement Anxiety Hospitalization/Surgery History - Moh's procedure to scalp. - tracheotomy as child. - bilateral cataracts removed. - left hip replacement. Medical A Surgical History Notes nd Endocrine hypothyroidism Oncologic sqamous cell carcinoma of the scalp Objective Constitutional respirations regular, non-labored and within target range for patient.. Vitals Time Taken: 9:54 AM, Height: 69 in, Weight: 161 lbs, BMI: 23.8, Temperature: 97.6 F, Pulse: 75 bpm, Respiratory Rate: 22  breaths/min, Blood Pressure: 185/68 mmHg. Psychiatric pleasant and cooperative. General Notes: Exposed calvarium with yellow rigid surface. No obvious signs of infection. T the Medial border there is atypical fibroxanthoma tissue that is o bulging. Integumentary (Hair, Skin) Wound #1 status is Open. Original cause of wound was Surgical Injury. The date acquired was: 12/06/2018. The wound has been in treatment 55 weeks. The wound is located on the Head - frontal. The wound measures 6.5cm length x 6cm width x 0.2cm depth; 30.631cm^2 area and 6.126cm^3 volume. There is Fat Layer (Subcutaneous Tissue) exposed. There is no tunneling or undermining noted. There is a medium amount of serosanguineous drainage noted. The wound margin is distinct with the outline attached  to the wound base. There is small (1-33%) pink, pale, hyper - granulation within the wound bed. There is a large (67- 100%) amount of necrotic tissue within the wound bed including Adherent Slough. Assessment Active Problems ICD-10 Unspecified open wound of scalp, subsequent encounter Chronic obstructive pulmonary disease, unspecified Other chronic osteomyelitis, other site Personal history of other malignant neoplasm of skin Patient presents for follow-up. Daughter-in-law was present. Unfortunately The patient has a new area located to the left posterior scalp that is biopsy confirmed for squamous cell carcinoma. He is following with dermatology for this. The anterior original wound continues to have a fungating mass that is larger from when I previously saw him. He saw plastic surgery that noted this to be an atypical fibroxanthoma. We had a long discussion about his exam findings from dermatology and plastic surgery. At this time I recommended he have further discussions with plastic surgery on how he will address the atypical fibroxanthoma and dessicated portion of the skull. From a wound care point of view I can only offer  Recommendations on dressings But nothing further in the treatment of his symptoms. He expressed understanding. He would like to follow-up in 1 month with [place] as he continues to have discussions with his wife and daughter-in-law to see what the next steps will be. 55 minutes was spent on the encounter including face-to-face, EMR review and coordination of care. Plan Follow-up Appointments: Return appointment in 1 month. - with Dr. Heber [place] and Leveda Anna, RN (Room 7) 01/07/22 @ 9:30am Bathing/ Shower/ Hygiene: May shower with protection but do not get wound dressing(s) wet. WOUND #1: - Head - frontal Wound Laterality: Cleanser: Wound Cleanser (Generic) Every Other Day/Other:60 days Discharge Instructions: Cleanse the wound with wound cleanser prior to applying a clean dressing using gauze sponges, not tissue or cotton balls. Prim Dressing: Gentell Hydrogel Silver Saturated Gauze, 4x4 (in/in) (Generic) Every Other Day/Other:60 days ary Discharge Instructions: Apply to wound bed as directed Secondary Dressing: Woven Gauze Sponge, Non-Sterile 4x4 in (DME) (Generic) Every Other Day/Other:60 days Discharge Instructions: Apply over primary dressing as directed. Secondary Dressing: Zetuvit Plus Silicone Border Dressing 5x5 (in/in) (DME) (Generic) Every Other Day/Other:60 days Discharge Instructions: Apply silicone border over primary dressing as directed. Secured With: 31M Medipore Public affairs consultant Surgical T 2x10 (in/yd) (DME) (Generic) Every Other Day/Other:60 days ape Discharge Instructions: Secure with tape as directed. 1. Hydrogel with foam border dressing 2. Follow-up in 1 month 3. Follow-up with plastic surgery Electronic Signature(s) Signed: 12/03/2021 1:32:28 PM By: Kalman Shan DO Entered By: Kalman Shan on 12/03/2021 12:29:20 -------------------------------------------------------------------------------- HxROS Details Patient Name: Date of Service: [name], [name] 12/03/2021 9:30 A  M Medical Record Number: [id number] Patient Account Number: [id number] Date of Birth/Sex: Treating RN: 11/17/1927 (88 y.o. [name] Primary Care Provider: Rich Number Other Clinician: Referring Provider: Treating Provider/Extender: [name] in Treatment: 65 Information Obtained From Patient Eyes Medical History: Positive for: Cataracts - bil removed Negative for: Glaucoma; Optic Neuritis Ear/Nose/Mouth/Throat Medical History: Negative for: Chronic sinus problems/congestion; Middle ear problems Respiratory Medical History: Positive for: Chronic Obstructive Pulmonary Disease (COPD) Negative for: Aspiration; Asthma; Pneumothorax; Sleep Apnea; Tuberculosis Gastrointestinal Medical History: Negative for: Cirrhosis ; Colitis; Crohns; Hepatitis A; Hepatitis B; Hepatitis C Endocrine Medical History: Negative for: Type I Diabetes; Type II Diabetes Past Medical History Notes: hypothyroidism Integumentary (Skin) Medical History: Negative for: History of Burn Musculoskeletal Medical History: Negative for: Gout; Rheumatoid Arthritis; Osteoarthritis; Osteomyelitis Oncologic Medical History: Positive for: Received Radiation -  20 treatments Negative for: Received Chemotherapy Past Medical History Notes: sqamous cell carcinoma of the scalp Psychiatric Medical History: Negative for: Anorexia/bulimia; Confinement Anxiety HBO Extended History Items Eyes: Cataracts Immunizations Pneumococcal Vaccine: Received Pneumococcal Vaccination: No Implantable Devices No devices added Hospitalization / Surgery History Type of Hospitalization/Surgery Moh's procedure to scalp tracheotomy as child bilateral cataracts removed left hip replacement Family and Social History Cancer: Yes - Father; Diabetes: No; Heart Disease: Yes - Mother; Hereditary Spherocytosis: No; Hypertension: No; Kidney Disease: No; Lung Disease: No; Seizures: No; Stroke: No;  Thyroid Problems: No; Tuberculosis: No; Former smoker; Marital Status - Married; Alcohol Use: Rarely; Drug Use: No History; Caffeine Use: Moderate; Financial Concerns: No; Food, Clothing or Shelter Needs: No; Support System Lacking: No; Transportation Concerns: No Electronic Signature(s) Signed: 12/03/2021 1:32:28 PM By: Kalman Shan DO Signed: 12/03/2021 5:26:14 PM By: Lorrin Jackson Entered By: Kalman Shan on 12/03/2021 12:20:23 -------------------------------------------------------------------------------- [place] Details Patient Name: Date of Service: [name], [name] 12/03/2021 Medical Record Number: [id number] Patient Account Number: [id number] Date of Birth/Sex: Treating RN: 10/26/1927 (88 y.o. [name] Primary Care Provider: Rich Number Other Clinician: Referring Provider: Treating Provider/Extender: [name] in Treatment: 55 Diagnosis Coding ICD-10 Codes Code Description S01.00XD Unspecified open wound of scalp, subsequent encounter J44.9 Chronic obstructive pulmonary disease, unspecified M86.68 Other chronic osteomyelitis, other site Z85.828 Personal history of other malignant neoplasm of skin Facility Procedures CPT4 Code: [id number] Description: 99213 - WOUND CARE VISIT-LEV 3 EST PT Modifier: Quantity: 1 Physician Procedures : CPT4 Code Description Modifier [id number] - WC PHYS LEVEL 4 - EST PT ICD-10 Diagnosis Description S01.00XD Unspecified open wound of scalp, subsequent encounter J44.9 Chronic obstructive pulmonary disease, unspecified M86.68 Other chronic  osteomyelitis, other site Z85.828 Personal history of other malignant neoplasm of skin Quantity: 1 Electronic Signature(s) Signed: 12/03/2021 1:32:28 PM By: Kalman Shan DO Entered By: Kalman Shan on 12/03/2021 12:32:22

## 2021-12-03 NOTE — Progress Notes (Addendum)
DRAYCEN, LEICHTER ([id number]) Visit Report for 12/03/2021 Arrival Information Details Patient Name: Date of Service: [name] [name] 12/03/2021 9:30 A M Medical Record Number: [id number] Patient Account Number: [id number] Date of Birth/Sex: Treating RN: Feb 15, 1928 (88 y.o. [name] Primary Care Rhylei Mcquaig: Rich Number Other Clinician: Referring Nickcole Bralley: Treating Sahaana Weitman/Extender: [name] in Treatment: 63 Visit Information History Since Last Visit Added or deleted any medications: No Patient Arrived: Gilford Rile Any new allergies or adverse reactions: No Arrival Time: 09:49 Had a fall or experienced change in No Accompanied By: daughter in law activities of daily living that may affect Transfer Assistance: None risk of falls: Patient Identification Verified: Yes Signs or symptoms of abuse/neglect since last visito No Secondary Verification Process Completed: Yes Hospitalized since last visit: No Patient Requires Transmission-Based Precautions: No Implantable device outside of the clinic excluding No Patient Has Alerts: No cellular tissue based products placed in the center since last visit: Has Dressing in Place as Prescribed: Yes Pain Present Now: No Electronic Signature(s) Signed: 12/03/2021 5:26:14 PM By: Lorrin Jackson Entered By: Lorrin Jackson on 12/03/2021 10:30:52 -------------------------------------------------------------------------------- Clinic Level of Care Assessment Details Patient Name: Date of Service: [name] [name] 12/03/2021 9:30 A M Medical Record Number: [id number] Patient Account Number: [id number] Date of Birth/Sex: Treating RN: 02-Mar-1928 (88 y.o. [name] Primary Care Paulmichael Schreck: Rich Number Other Clinician: Referring Trishelle Devora: Treating Lashandra Arauz/Extender: [name] in Treatment: 79 Clinic Level of Care Assessment Items TOOL 4 Quantity Score X- 1 0 Use when  only an EandM is performed on FOLLOW-UP visit ASSESSMENTS - Nursing Assessment / Reassessment X- 1 10 Reassessment of Co-morbidities (includes updates in patient status) X- 1 5 Reassessment of Adherence to Treatment Plan ASSESSMENTS - Wound and Skin A ssessment / Reassessment X - Simple Wound Assessment / Reassessment - one wound 1 5 '[]'$  - 0 Complex Wound Assessment / Reassessment - multiple wounds '[]'$  - 0 Dermatologic / Skin Assessment (not related to wound area) ASSESSMENTS - Focused Assessment '[]'$  - 0 Circumferential Edema Measurements - multi extremities '[]'$  - 0 Nutritional Assessment / Counseling / Intervention '[]'$  - 0 Lower Extremity Assessment (monofilament, tuning fork, pulses) '[]'$  - 0 Peripheral Arterial Disease Assessment (using hand held doppler) ASSESSMENTS - Ostomy and/or Continence Assessment and Care '[]'$  - 0 Incontinence Assessment and Management '[]'$  - 0 Ostomy Care Assessment and Management (repouching, etc.) PROCESS - Coordination of Care '[]'$  - 0 Simple Patient / Family Education for ongoing care X- 1 20 Complex (extensive) Patient / Family Education for ongoing care X- 1 10 Staff obtains Programmer, systems, Records, T Results / Process Orders est '[]'$  - 0 Staff telephones HHA, Nursing Homes / Clarify orders / etc '[]'$  - 0 Routine Transfer to another Facility (non-emergent condition) '[]'$  - 0 Routine Hospital Admission (non-emergent condition) '[]'$  - 0 New Admissions / Biomedical engineer / Ordering NPWT Apligraf, etc. , '[]'$  - 0 Emergency Hospital Admission (emergent condition) '[]'$  - 0 Simple Discharge Coordination '[]'$  - 0 Complex (extensive) Discharge Coordination PROCESS - Special Needs '[]'$  - 0 Pediatric / Minor Patient Management '[]'$  - 0 Isolation Patient Management '[]'$  - 0 Hearing / Language / Visual special needs '[]'$  - 0 Assessment of Community assistance (transportation, D/C planning, etc.) '[]'$  - 0 Additional assistance / Altered mentation '[]'$  - 0 Support  Surface(s) Assessment (bed, cushion, seat, etc.) INTERVENTIONS - Wound Cleansing / Measurement X - Simple Wound Cleansing - one wound 1 5 '[]'$  - 0 Complex Wound Cleansing -  multiple wounds X- 1 5 Wound Imaging (photographs - any number of wounds) '[]'$  - 0 Wound Tracing (instead of photographs) X- 1 5 Simple Wound Measurement - one wound '[]'$  - 0 Complex Wound Measurement - multiple wounds INTERVENTIONS - Wound Dressings '[]'$  - 0 Small Wound Dressing one or multiple wounds X- 1 15 Medium Wound Dressing one or multiple wounds '[]'$  - 0 Large Wound Dressing one or multiple wounds '[]'$  - 0 Application of Medications - topical '[]'$  - 0 Application of Medications - injection INTERVENTIONS - Miscellaneous '[]'$  - 0 External ear exam '[]'$  - 0 Specimen Collection (cultures, biopsies, blood, body fluids, etc.) '[]'$  - 0 Specimen(s) / Culture(s) sent or taken to Lab for analysis '[]'$  - 0 Patient Transfer (multiple staff / Civil Service fast streamer / Similar devices) '[]'$  - 0 Simple Staple / Suture removal (25 or less) '[]'$  - 0 Complex Staple / Suture removal (26 or more) '[]'$  - 0 Hypo / Hyperglycemic Management (close monitor of Blood Glucose) '[]'$  - 0 Ankle / Brachial Index (ABI) - do not check if billed separately X- 1 5 Vital Signs Has the patient been seen at the hospital within the last three years: Yes Total Score: 85 Level Of Care: New/Established - Level 3 Electronic Signature(s) Signed: 12/03/2021 5:26:14 PM By: Lorrin Jackson Entered By: Lorrin Jackson on 12/03/2021 10:52:02 -------------------------------------------------------------------------------- Encounter Discharge Information Details Patient Name: Date of Service: [name], [name] 12/03/2021 9:30 A M Medical Record Number: [id number] Patient Account Number: [id number] Date of Birth/Sex: Treating RN: 01/29/1928 (88 y.o. [name] Primary Care Siedah Sedor: Rich Number Other Clinician: Referring Raman Featherston: Treating Rooney Swails/Extender: [name] in Treatment: 34 Encounter Discharge Information Items Discharge Condition: Stable Ambulatory Status: Walker Discharge Destination: Home Transportation: Private Auto Accompanied By: daughter in law Schedule Follow-up Appointment: Yes Clinical Summary of Care: Provided on 12/03/2021 Form Type Recipient Paper Patient Patient Electronic Signature(s) Signed: 12/03/2021 5:26:14 PM By: Lorrin Jackson Entered By: Lorrin Jackson on 12/03/2021 11:41:43 -------------------------------------------------------------------------------- Lower Extremity Assessment Details Patient Name: Date of Service: [name], [name] 12/03/2021 9:30 A M Medical Record Number: [id number] Patient Account Number: [id number] Date of Birth/Sex: Treating RN: [birth year] (88 y.o. [name] Primary Care Anahita Cua: Rich Number Other Clinician: Referring Rama Mcclintock: Treating Shaconda Hajduk/Extender: [name] in Treatment: 55 Electronic Signature(s) Signed: 12/03/2021 5:26:14 PM By: Lorrin Jackson Entered By: Lorrin Jackson on 12/03/2021 09:59:30 -------------------------------------------------------------------------------- Multi Wound Chart Details Patient Name: Date of Service: [name], [name] 12/03/2021 9:30 A M Medical Record Number: [id number] Patient Account Number: [id number] Date of Birth/Sex: Treating RN: 16-Nov-1927 (88 y.o. [name] Primary Care Ruta Capece: Rich Number Other Clinician: Referring Namiyah Grantham: Treating Devani Odonnel/Extender: [name] in Treatment: 89 Vital Signs Height(in): 14 Pulse(bpm): 75 Weight(lbs): 161 Blood Pressure(mmHg): 185/68 Body Mass Index(BMI): 23.8 Temperature(F): 97.6 Respiratory Rate(breaths/min): 22 Photos: [N/A:N/A] Head - frontal N/A N/A Wound Location: Surgical Injury N/A N/A Wounding Event: Soft Tissue Radionecrosis N/A N/A Primary Etiology: Open  Surgical Wound N/A N/A Secondary Etiology: Cataracts, Chronic Obstructive N/A N/A Comorbid History: Pulmonary Disease (COPD), Received Radiation 12/06/2018 N/A N/A Date Acquired: 27 N/A N/A Weeks of Treatment: Open N/A N/A Wound Status: No N/A N/A Wound Recurrence: 6.5x6x0.2 N/A N/A Measurements L x W x D (cm) 30.631 N/A N/A A (cm) : rea 6.126 N/A N/A Volume (cm) : -29.00% N/A N/A % Reduction in Area: -29.00% N/A N/A % Reduction in Volume: Full Thickness With Exposed Support N/A N/A Classification: Structures  Medium N/A N/A Exudate Amount: Serosanguineous N/A N/A Exudate Type: red, brown N/A N/A Exudate Color: Distinct, outline attached N/A N/A Wound Margin: Small (1-33%) N/A N/A Granulation Amount: Pink, Pale, Hyper-granulation N/A N/A Granulation Quality: Large (67-100%) N/A N/A Necrotic Amount: Fat Layer (Subcutaneous Tissue): Yes N/A N/A Exposed Structures: Fascia: No Tendon: No Muscle: No Joint: No Bone: No Small (1-33%) N/A N/A Epithelialization: Treatment Notes Wound #1 (Head - frontal) Cleanser Wound Cleanser Discharge Instruction: Cleanse the wound with wound cleanser prior to applying a clean dressing using gauze sponges, not tissue or cotton balls. Peri-Wound Care Topical Primary Dressing Gentell Hydrogel Silver Saturated Gauze, 4x4 (in/in) Discharge Instruction: Apply to wound bed as directed Secondary Dressing Woven Gauze Sponge, Non-Sterile 4x4 in Discharge Instruction: Apply over primary dressing as directed. Zetuvit Plus Silicone Border Dressing 5x5 (in/in) Discharge Instruction: Apply silicone border over primary dressing as directed. Secured With [employer] Surgical T 2x10 (in/yd) ape Discharge Instruction: Secure with tape as directed. Compression Wrap Compression Stockings Add-Ons Electronic Signature(s) Signed: 12/03/2021 1:32:28 PM By: Kalman Shan DO Signed: 12/03/2021 5:26:14 PM By: Lorrin Jackson Entered By: Kalman Shan on 12/03/2021 12:06:26 -------------------------------------------------------------------------------- Multi-Disciplinary Care Plan Details Patient Name: Date of Service: [name], [name] 12/03/2021 9:30 A M Medical Record Number: [id number] Patient Account Number: [id number] Date of Birth/Sex: Treating RN: [birth year] (88 y.o. [name] Primary Care Tequilla Cousineau: Rich Number Other Clinician: Referring Shrinika Blatz: Treating Hamish Banks/Extender: [name] in Treatment: 9 Active Inactive Electronic Signature(s) Signed: 01/27/2022 8:31:22 AM By: Lorrin Jackson Previous Signature: 12/03/2021 5:26:14 PM Version By: Lorrin Jackson Entered By: Lorrin Jackson on 01/27/2022 08:31:21 -------------------------------------------------------------------------------- Pain Assessment Details Patient Name: Date of Service: [name], [name] 12/03/2021 9:30 A M Medical Record Number: [id number] Patient Account Number: [id number] Date of Birth/Sex: Treating RN: 02/12/1928 (88 y.o. [name] Primary Care Sheranda Seabrooks: Rich Number Other Clinician: Referring Caulder Wehner: Treating Alix Stowers/Extender: [name] in Treatment: 66 Active Problems Location of Pain Severity and Description of Pain Patient Has Paino No Site Locations Pain Management and Medication Current Pain Management: Electronic Signature(s) Signed: 12/03/2021 5:26:14 PM By: Lorrin Jackson Entered By: Lorrin Jackson on 12/03/2021 09:59:23 -------------------------------------------------------------------------------- Patient/Caregiver Education Details Patient Name: Date of Service: [name] [name] 6/15/2023andnbsp9:30 A M Medical Record Number: [id number] Patient Account Number: [id number] Date of Birth/Gender: Treating RN: [birth year] (88 y.o. [name] Primary Care Physician: Rich Number Other  Clinician: Referring Physician: Treating Physician/Extender: [name] in Treatment: 58 Education Assessment Education Provided To: Patient Education Topics Provided Wound/Skin Impairment: Methods: Demonstration, Explain/Verbal, Printed Responses: State content correctly Electronic Signature(s) Signed: 12/03/2021 5:26:14 PM By: Lorrin Jackson Entered By: Lorrin Jackson on 12/03/2021 10:03:52 -------------------------------------------------------------------------------- Wound Assessment Details Patient Name: Date of Service: [name], [name] 12/03/2021 9:30 A M Medical Record Number: [id number] Patient Account Number: [id number] Date of Birth/Sex: Treating RN: 1928/04/05 (88 y.o. [name] Primary Care Nikodem Leadbetter: Rich Number Other Clinician: Referring Leshaun Biebel: Treating Mayleigh Tetrault/Extender: [name] in Treatment: 8 Wound Status Wound Number: 1 Primary Soft Tissue Radionecrosis Etiology: Wound Location: Head - frontal Secondary Open Surgical Wound Wounding Event: Surgical Injury Etiology: Date Acquired: 12/06/2018 Wound Status: Open Weeks Of Treatment: 55 Comorbid Cataracts, Chronic Obstructive Pulmonary Disease (COPD), Clustered Wound: No History: Received Radiation Photos Wound Measurements Length: (cm) 6.5 Width: (cm) 6 Depth: (cm) 0.2 Area: (cm) 30.631 Volume: (cm) 6.126 % Reduction in Area: -29% % Reduction in Volume: -29%  Epithelialization: Small (1-33%) Tunneling: No Undermining: No Wound Description Classification: Full Thickness With Exposed Support Structures Wound Margin: Distinct, outline attached Exudate Amount: Medium Exudate Type: Serosanguineous Exudate Color: red, brown Foul Odor After Cleansing: No Slough/Fibrino Yes Wound Bed Granulation Amount: Small (1-33%) Exposed Structure Granulation Quality: Pink, Pale, Hyper-granulation Fascia Exposed: No Necrotic Amount:  Large (67-100%) Fat Layer (Subcutaneous Tissue) Exposed: Yes Necrotic Quality: Adherent Slough Tendon Exposed: No Muscle Exposed: No Joint Exposed: No Bone Exposed: No Electronic Signature(s) Signed: 12/03/2021 5:26:14 PM By: Lorrin Jackson Entered By: Lorrin Jackson on 12/03/2021 10:06:25 -------------------------------------------------------------------------------- Vitals Details Patient Name: Date of Service: [name], [name] 12/03/2021 9:30 A M Medical Record Number: [id number] Patient Account Number: [id number] Date of Birth/Sex: Treating RN: February 04, 1928 (88 y.o. [name] Primary Care Radley Teston: Rich Number Other Clinician: Referring Romari Gasparro: Treating Quin Mcpherson/Extender: [name] in Treatment: 55 Vital Signs Time Taken: 09:54 Temperature (F): 97.6 Height (in): 69 Pulse (bpm): 75 Weight (lbs): 161 Respiratory Rate (breaths/min): 22 Body Mass Index (BMI): 23.8 Blood Pressure (mmHg): 185/68 Reference Range: 80 - 120 mg / dl Electronic Signature(s) Signed: 12/03/2021 5:26:14 PM By: Lorrin Jackson Entered By: Lorrin Jackson on 12/03/2021 09:56:04

## 2021-12-04 ENCOUNTER — Telehealth: Payer: Self-pay | Admitting: Pulmonary Disease

## 2021-12-04 DIAGNOSIS — J439 Emphysema, unspecified: Secondary | ICD-10-CM

## 2021-12-04 NOTE — Telephone Encounter (Signed)
Spoke with the pt  He states needing form signed for his albuterol neb sol  I called [facility]  They advised a CMN has to be signed  Vallarie Mare, do you have this?

## 2021-12-07 NOTE — Telephone Encounter (Signed)
Called and spoke with pt's spouse letting her know that the form had been received by [facility] and has been signed by Dr. Halford Chessman. Understanding was verbalized. Nothing further needed.

## 2021-12-07 NOTE — Telephone Encounter (Signed)
I do not have this. 

## 2021-12-07 NOTE — Telephone Encounter (Signed)
[facility] CMNs are always placed in the refills file up front by the front staff whenever they are faxed to the office.  Routing this to Dr. Halford Chessman as well as Ronney Asters to see if this has been received. Please advise.

## 2021-12-07 NOTE — Telephone Encounter (Signed)
Form signed.

## 2021-12-08 ENCOUNTER — Ambulatory Visit (HOSPITAL_BASED_OUTPATIENT_CLINIC_OR_DEPARTMENT_OTHER): Payer: Medicare Other | Admitting: Internal Medicine

## 2021-12-28 ENCOUNTER — Inpatient Hospital Stay (HOSPITAL_COMMUNITY)
Admission: EM | Admit: 2021-12-28 | Discharge: 2022-01-19 | DRG: 871 | Disposition: E | Payer: Medicare Other | Attending: Critical Care Medicine | Admitting: Critical Care Medicine

## 2021-12-28 ENCOUNTER — Inpatient Hospital Stay (HOSPITAL_COMMUNITY): Payer: Medicare Other

## 2021-12-28 ENCOUNTER — Emergency Department (HOSPITAL_COMMUNITY): Payer: Medicare Other

## 2021-12-28 ENCOUNTER — Encounter (HOSPITAL_COMMUNITY): Payer: Self-pay

## 2021-12-28 ENCOUNTER — Other Ambulatory Visit: Payer: Self-pay

## 2021-12-28 DIAGNOSIS — I130 Hypertensive heart and chronic kidney disease with heart failure and stage 1 through stage 4 chronic kidney disease, or unspecified chronic kidney disease: Secondary | ICD-10-CM | POA: Diagnosis present

## 2021-12-28 DIAGNOSIS — E039 Hypothyroidism, unspecified: Secondary | ICD-10-CM | POA: Diagnosis present

## 2021-12-28 DIAGNOSIS — J9601 Acute respiratory failure with hypoxia: Secondary | ICD-10-CM | POA: Diagnosis present

## 2021-12-28 DIAGNOSIS — Z7952 Long term (current) use of systemic steroids: Secondary | ICD-10-CM

## 2021-12-28 DIAGNOSIS — Z515 Encounter for palliative care: Secondary | ICD-10-CM

## 2021-12-28 DIAGNOSIS — Z66 Do not resuscitate: Secondary | ICD-10-CM | POA: Diagnosis present

## 2021-12-28 DIAGNOSIS — Z882 Allergy status to sulfonamides status: Secondary | ICD-10-CM

## 2021-12-28 DIAGNOSIS — J439 Emphysema, unspecified: Secondary | ICD-10-CM | POA: Diagnosis present

## 2021-12-28 DIAGNOSIS — R6521 Severe sepsis with septic shock: Secondary | ICD-10-CM | POA: Diagnosis present

## 2021-12-28 DIAGNOSIS — I5031 Acute diastolic (congestive) heart failure: Secondary | ICD-10-CM | POA: Diagnosis present

## 2021-12-28 DIAGNOSIS — N179 Acute kidney failure, unspecified: Secondary | ICD-10-CM | POA: Diagnosis present

## 2021-12-28 DIAGNOSIS — Z20822 Contact with and (suspected) exposure to covid-19: Secondary | ICD-10-CM | POA: Diagnosis present

## 2021-12-28 DIAGNOSIS — Z87442 Personal history of urinary calculi: Secondary | ICD-10-CM

## 2021-12-28 DIAGNOSIS — Z79899 Other long term (current) drug therapy: Secondary | ICD-10-CM | POA: Diagnosis not present

## 2021-12-28 DIAGNOSIS — J441 Chronic obstructive pulmonary disease with (acute) exacerbation: Secondary | ICD-10-CM | POA: Diagnosis present

## 2021-12-28 DIAGNOSIS — C4490 Unspecified malignant neoplasm of skin, unspecified: Secondary | ICD-10-CM | POA: Diagnosis present

## 2021-12-28 DIAGNOSIS — J9600 Acute respiratory failure, unspecified whether with hypoxia or hypercapnia: Principal | ICD-10-CM

## 2021-12-28 DIAGNOSIS — R778 Other specified abnormalities of plasma proteins: Secondary | ICD-10-CM | POA: Diagnosis not present

## 2021-12-28 DIAGNOSIS — Z87891 Personal history of nicotine dependence: Secondary | ICD-10-CM

## 2021-12-28 DIAGNOSIS — J189 Pneumonia, unspecified organism: Secondary | ICD-10-CM | POA: Diagnosis present

## 2021-12-28 DIAGNOSIS — E785 Hyperlipidemia, unspecified: Secondary | ICD-10-CM | POA: Diagnosis present

## 2021-12-28 DIAGNOSIS — R54 Age-related physical debility: Secondary | ICD-10-CM | POA: Diagnosis present

## 2021-12-28 DIAGNOSIS — N1831 Chronic kidney disease, stage 3a: Secondary | ICD-10-CM | POA: Diagnosis present

## 2021-12-28 DIAGNOSIS — Z96642 Presence of left artificial hip joint: Secondary | ICD-10-CM | POA: Diagnosis present

## 2021-12-28 DIAGNOSIS — A419 Sepsis, unspecified organism: Secondary | ICD-10-CM | POA: Diagnosis present

## 2021-12-28 DIAGNOSIS — J9622 Acute and chronic respiratory failure with hypercapnia: Secondary | ICD-10-CM | POA: Diagnosis present

## 2021-12-28 DIAGNOSIS — Z9842 Cataract extraction status, left eye: Secondary | ICD-10-CM

## 2021-12-28 DIAGNOSIS — Z8249 Family history of ischemic heart disease and other diseases of the circulatory system: Secondary | ICD-10-CM

## 2021-12-28 DIAGNOSIS — Z79890 Hormone replacement therapy (postmenopausal): Secondary | ICD-10-CM

## 2021-12-28 DIAGNOSIS — R7989 Other specified abnormal findings of blood chemistry: Secondary | ICD-10-CM | POA: Diagnosis present

## 2021-12-28 DIAGNOSIS — J9621 Acute and chronic respiratory failure with hypoxia: Secondary | ICD-10-CM | POA: Diagnosis present

## 2021-12-28 DIAGNOSIS — Z9841 Cataract extraction status, right eye: Secondary | ICD-10-CM

## 2021-12-28 DIAGNOSIS — R6520 Severe sepsis without septic shock: Secondary | ICD-10-CM

## 2021-12-28 LAB — MRSA NEXT GEN BY PCR, NASAL: MRSA by PCR Next Gen: NOT DETECTED

## 2021-12-28 LAB — URINALYSIS, ROUTINE W REFLEX MICROSCOPIC
Bacteria, UA: NONE SEEN
Bilirubin Urine: NEGATIVE
Glucose, UA: 50 mg/dL — AB
Hgb urine dipstick: NEGATIVE
Ketones, ur: 5 mg/dL — AB
Leukocytes,Ua: NEGATIVE
Nitrite: NEGATIVE
Protein, ur: 30 mg/dL — AB
Specific Gravity, Urine: 1.023 (ref 1.005–1.030)
pH: 5 (ref 5.0–8.0)

## 2021-12-28 LAB — CBC WITH DIFFERENTIAL/PLATELET
Abs Immature Granulocytes: 0.11 10*3/uL — ABNORMAL HIGH (ref 0.00–0.07)
Basophils Absolute: 0 10*3/uL (ref 0.0–0.1)
Basophils Relative: 0 %
Eosinophils Absolute: 0 10*3/uL (ref 0.0–0.5)
Eosinophils Relative: 0 %
HCT: 49.1 % (ref 39.0–52.0)
Hemoglobin: 15.7 g/dL (ref 13.0–17.0)
Immature Granulocytes: 1 %
Lymphocytes Relative: 6 %
Lymphs Abs: 1.2 10*3/uL (ref 0.7–4.0)
MCH: 30.2 pg (ref 26.0–34.0)
MCHC: 32 g/dL (ref 30.0–36.0)
MCV: 94.4 fL (ref 80.0–100.0)
Monocytes Absolute: 1.7 10*3/uL — ABNORMAL HIGH (ref 0.1–1.0)
Monocytes Relative: 8 %
Neutro Abs: 16.7 10*3/uL — ABNORMAL HIGH (ref 1.7–7.7)
Neutrophils Relative %: 85 %
Platelets: 378 10*3/uL (ref 150–400)
RBC: 5.2 MIL/uL (ref 4.22–5.81)
RDW: 14.4 % (ref 11.5–15.5)
WBC: 19.8 10*3/uL — ABNORMAL HIGH (ref 4.0–10.5)
nRBC: 0 % (ref 0.0–0.2)

## 2021-12-28 LAB — RESP PANEL BY RT-PCR (FLU A&B, COVID) ARPGX2
Influenza A by PCR: NEGATIVE
Influenza B by PCR: NEGATIVE
SARS Coronavirus 2 by RT PCR: NEGATIVE

## 2021-12-28 LAB — D-DIMER, QUANTITATIVE: D-Dimer, Quant: 2.68 ug/mL-FEU — ABNORMAL HIGH (ref 0.00–0.50)

## 2021-12-28 LAB — BASIC METABOLIC PANEL
Anion gap: 13 (ref 5–15)
BUN: 34 mg/dL — ABNORMAL HIGH (ref 8–23)
CO2: 21 mmol/L — ABNORMAL LOW (ref 22–32)
Calcium: 8.1 mg/dL — ABNORMAL LOW (ref 8.9–10.3)
Chloride: 104 mmol/L (ref 98–111)
Creatinine, Ser: 2.05 mg/dL — ABNORMAL HIGH (ref 0.61–1.24)
GFR, Estimated: 30 mL/min — ABNORMAL LOW (ref >60)
Glucose, Bld: 188 mg/dL — ABNORMAL HIGH (ref 70–99)
Potassium: 5 mmol/L (ref 3.5–5.1)
Sodium: 138 mmol/L (ref 135–145)

## 2021-12-28 LAB — COMPREHENSIVE METABOLIC PANEL
ALT: 22 U/L (ref 0–44)
AST: 26 U/L (ref 15–41)
Albumin: 3.3 g/dL — ABNORMAL LOW (ref 3.5–5.0)
Alkaline Phosphatase: 192 U/L — ABNORMAL HIGH (ref 38–126)
Anion gap: 9 (ref 5–15)
BUN: 32 mg/dL — ABNORMAL HIGH (ref 8–23)
CO2: 26 mmol/L (ref 22–32)
Calcium: 9.2 mg/dL (ref 8.9–10.3)
Chloride: 105 mmol/L (ref 98–111)
Creatinine, Ser: 1.75 mg/dL — ABNORMAL HIGH (ref 0.61–1.24)
GFR, Estimated: 36 mL/min — ABNORMAL LOW (ref >60)
Glucose, Bld: 155 mg/dL — ABNORMAL HIGH (ref 70–99)
Potassium: 4.3 mmol/L (ref 3.5–5.1)
Sodium: 140 mmol/L (ref 135–145)
Total Bilirubin: 0.8 mg/dL (ref 0.3–1.2)
Total Protein: 6.5 g/dL (ref 6.5–8.1)

## 2021-12-28 LAB — PROTIME-INR
INR: 1.1 (ref 0.8–1.2)
Prothrombin Time: 14.4 seconds (ref 11.4–15.2)

## 2021-12-28 LAB — BLOOD GAS, ARTERIAL
Acid-base deficit: 2.8 mmol/L — ABNORMAL HIGH (ref 0.0–2.0)
Bicarbonate: 23.7 mmol/L (ref 20.0–28.0)
O2 Saturation: 99.9 %
Patient temperature: 37
pCO2 arterial: 46 mmHg (ref 32–48)
pH, Arterial: 7.32 — ABNORMAL LOW (ref 7.35–7.45)
pO2, Arterial: 210 mmHg — ABNORMAL HIGH (ref 83–108)

## 2021-12-28 LAB — TSH: TSH: 2.906 u[IU]/mL (ref 0.350–4.500)

## 2021-12-28 LAB — OSMOLALITY: Osmolality: 304 mOsm/kg — ABNORMAL HIGH (ref 275–295)

## 2021-12-28 LAB — PHOSPHORUS: Phosphorus: 4.4 mg/dL (ref 2.5–4.6)

## 2021-12-28 LAB — TROPONIN I (HIGH SENSITIVITY)
Troponin I (High Sensitivity): 80 ng/L — ABNORMAL HIGH (ref <18)
Troponin I (High Sensitivity): 192 ng/L (ref <18)
Troponin I (High Sensitivity): 738 ng/L (ref <18)

## 2021-12-28 LAB — BRAIN NATRIURETIC PEPTIDE: B Natriuretic Peptide: 169 pg/mL — ABNORMAL HIGH (ref 0.0–100.0)

## 2021-12-28 LAB — HEPATIC FUNCTION PANEL
ALT: 18 U/L (ref 0–44)
AST: 21 U/L (ref 15–41)
Albumin: 2.5 g/dL — ABNORMAL LOW (ref 3.5–5.0)
Alkaline Phosphatase: 152 U/L — ABNORMAL HIGH (ref 38–126)
Bilirubin, Direct: 0.2 mg/dL (ref 0.0–0.2)
Indirect Bilirubin: 0.7 mg/dL (ref 0.3–0.9)
Total Bilirubin: 0.9 mg/dL (ref 0.3–1.2)
Total Protein: 5.4 g/dL — ABNORMAL LOW (ref 6.5–8.1)

## 2021-12-28 LAB — CK: Total CK: 73 U/L (ref 49–397)

## 2021-12-28 LAB — PROCALCITONIN: Procalcitonin: 0.14 ng/mL

## 2021-12-28 LAB — APTT: aPTT: 27 seconds (ref 24–36)

## 2021-12-28 LAB — MAGNESIUM: Magnesium: 1.7 mg/dL (ref 1.7–2.4)

## 2021-12-28 LAB — LACTIC ACID, PLASMA
Lactic Acid, Venous: 2.4 mmol/L (ref 0.5–1.9)
Lactic Acid, Venous: 2.6 mmol/L (ref 0.5–1.9)

## 2021-12-28 MED ORDER — POLYETHYLENE GLYCOL 3350 17 G PO PACK: 17.0000 g | PACK | Freq: Every day | ORAL | Status: DC | PRN

## 2021-12-28 MED ORDER — ACETAMINOPHEN 650 MG RE SUPP
650.0000 mg | Freq: Once | RECTAL | Status: AC
  Administered 2021-12-28: 650 mg via RECTAL
  Filled 2021-12-28: qty 1

## 2021-12-28 MED ORDER — DOCUSATE SODIUM 100 MG PO CAPS: 100.0000 mg | ORAL_CAPSULE | Freq: Two times a day (BID) | ORAL | Status: DC | PRN

## 2021-12-28 MED ORDER — ORAL CARE MOUTH RINSE: 15.0000 mL | OROMUCOSAL | Status: DC

## 2021-12-28 MED ORDER — STERILE WATER FOR INJECTION IJ SOLN
INTRAMUSCULAR | Status: AC
  Administered 2021-12-28: 10 mL
  Filled 2021-12-28: qty 10

## 2021-12-28 MED ORDER — CHLORHEXIDINE GLUCONATE CLOTH 2 % EX PADS: 6.0000 | MEDICATED_PAD | Freq: Every day | CUTANEOUS | Status: DC

## 2021-12-28 MED ORDER — LACTATED RINGERS IV SOLN: INTRAVENOUS | Status: DC

## 2021-12-28 MED ORDER — NOREPINEPHRINE 4 MG/250ML-% IV SOLN
0.0000 ug/min | INTRAVENOUS | Status: DC
  Administered 2021-12-28: 2 ug/min via INTRAVENOUS
  Filled 2021-12-28: qty 250

## 2021-12-28 MED ORDER — ONDANSETRON HCL 4 MG/2ML IJ SOLN: 4.0000 mg | Freq: Four times a day (QID) | INTRAMUSCULAR | Status: DC | PRN

## 2021-12-28 MED ORDER — IPRATROPIUM-ALBUTEROL 0.5-2.5 (3) MG/3ML IN SOLN
3.0000 mL | Freq: Four times a day (QID) | RESPIRATORY_TRACT | Status: DC | PRN
Start: 2021-12-28 — End: 2021-12-29

## 2021-12-28 MED ORDER — SODIUM CHLORIDE 0.9 % IV SOLN: 2.0000 g | INTRAVENOUS | Status: DC

## 2021-12-28 MED ORDER — SODIUM CHLORIDE 0.9 % IV SOLN: 250.0000 mL | INTRAVENOUS | Status: DC

## 2021-12-28 MED ORDER — IPRATROPIUM-ALBUTEROL 0.5-2.5 (3) MG/3ML IN SOLN
3.0000 mL | RESPIRATORY_TRACT | Status: DC
Start: 2021-12-28 — End: 2021-12-29
  Administered 2021-12-28 (×2): 3 mL via RESPIRATORY_TRACT
  Filled 2021-12-28 (×2): qty 3

## 2021-12-28 MED ORDER — LACTATED RINGERS IV BOLUS
250.0000 mL | Freq: Once | INTRAVENOUS | Status: AC
  Administered 2021-12-28: 250 mL via INTRAVENOUS

## 2021-12-28 MED ORDER — SODIUM CHLORIDE 0.9 % IV SOLN: INTRAVENOUS | Status: DC | PRN

## 2021-12-28 MED ORDER — SODIUM CHLORIDE 0.9 % IV BOLUS
1000.0000 mL | Freq: Once | INTRAVENOUS | Status: AC
  Administered 2021-12-28: 1000 mL via INTRAVENOUS

## 2021-12-28 MED ORDER — SODIUM CHLORIDE 0.9 % IV SOLN: 500.0000 mg | INTRAVENOUS | Status: DC

## 2021-12-28 MED ORDER — METHYLPREDNISOLONE SODIUM SUCC 125 MG IJ SOLR
125.0000 mg | Freq: Once | INTRAMUSCULAR | Status: AC
  Administered 2021-12-28: 125 mg via INTRAVENOUS
  Filled 2021-12-28: qty 2

## 2021-12-28 MED ORDER — ALBUTEROL SULFATE (2.5 MG/3ML) 0.083% IN NEBU
2.5000 mg | INHALATION_SOLUTION | Freq: Once | RESPIRATORY_TRACT | Status: AC
  Administered 2021-12-28: 2.5 mg via RESPIRATORY_TRACT
  Filled 2021-12-28: qty 3

## 2021-12-28 MED ORDER — MORPHINE SULFATE (PF) 2 MG/ML IV SOLN
1.0000 mg | INTRAVENOUS | Status: DC | PRN
  Administered 2021-12-28: 1 mg via INTRAVENOUS
  Filled 2021-12-28: qty 1

## 2021-12-28 MED ORDER — IPRATROPIUM-ALBUTEROL 0.5-2.5 (3) MG/3ML IN SOLN
3.0000 mL | Freq: Once | RESPIRATORY_TRACT | Status: AC
  Administered 2021-12-28: 3 mL via RESPIRATORY_TRACT
  Filled 2021-12-28: qty 3

## 2021-12-28 MED ORDER — PHENYLEPHRINE HCL-NACL 20-0.9 MG/250ML-% IV SOLN
25.0000 ug/min | INTRAVENOUS | Status: DC
  Administered 2021-12-29: 25 ug/min via INTRAVENOUS
  Filled 2021-12-28: qty 250

## 2021-12-28 MED ORDER — IOHEXOL 350 MG/ML SOLN
75.0000 mL | Freq: Once | INTRAVENOUS | Status: AC | PRN
  Administered 2021-12-28: 75 mL via INTRAVENOUS

## 2021-12-28 MED ORDER — SODIUM CHLORIDE 0.9 % IV SOLN
500.0000 mg | Freq: Once | INTRAVENOUS | Status: AC
  Administered 2021-12-28: 500 mg via INTRAVENOUS
  Filled 2021-12-28: qty 5

## 2021-12-28 MED ORDER — ALBUTEROL SULFATE (2.5 MG/3ML) 0.083% IN NEBU
5.0000 mg | INHALATION_SOLUTION | Freq: Once | RESPIRATORY_TRACT | Status: AC
  Administered 2021-12-28: 5 mg via RESPIRATORY_TRACT
  Filled 2021-12-28: qty 6

## 2021-12-28 MED ORDER — SODIUM CHLORIDE 0.9 % IV SOLN
1.0000 g | Freq: Once | INTRAVENOUS | Status: AC
  Administered 2021-12-28: 1 g via INTRAVENOUS
  Filled 2021-12-28 (×2): qty 10

## 2021-12-28 MED ORDER — FUROSEMIDE 10 MG/ML IJ SOLN
60.0000 mg | Freq: Once | INTRAMUSCULAR | Status: DC
Start: 2021-12-28 — End: 2021-12-28

## 2021-12-28 MED ORDER — ORAL CARE MOUTH RINSE: 15.0000 mL | OROMUCOSAL | Status: DC | PRN

## 2021-12-28 NOTE — Sepsis Progress Note (Signed)
Elink following code sepsis °

## 2021-12-28 NOTE — ED Notes (Signed)
CRITICAL VALUE STICKER  CRITICAL VALUE: Lactic acid 2.4  DATE & TIME NOTIFIED: 01/09/2022 1751  MD NOTIFIED: Regenia Skeeter MD  [place]: [phone]

## 2021-12-28 NOTE — ED Notes (Signed)
Patient transported to CT. This RN called about a different room assignment as the bed number was changed on the 2nd floor. Secretary stated that it was still ok to bring patient up, as it was the same nurse receiving patient.

## 2021-12-28 NOTE — ED Triage Notes (Signed)
Pt BIB EMS from home. Pt reports sudden onset of SHOB that began today. Pt reports going to have some skin cancer removed this morning and started becoming [facility] after that. Pt denies chest pain, back pain, abdominal pain, and N/V/D.

## 2021-12-28 NOTE — Progress Notes (Signed)
NAME:  [name], MRN:  [id number], DOB:  [birth year], LOS: 0 ADMISSION DATE:  01/13/2022, CONSULTATION DATE:  7/10 REFERRING MD:  Maricela Curet, CHIEF COMPLAINT:  hypoxia   History of Present Illness:  [name] is a 88 year old gentleman with a history of tobacco abuse before quitting about 10 years ago and COPD on nocturnal oxygen who presented with sudden onset shortness of breath today.  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  He was started on oxygen during his procedure.  By the time he got home he could barely walk from his car into his garage.  At that time his daughter and wife decided to call EMS.  He has had decreased functional ability over the last few months, especially over the last few weeks, which his wife attributes to sciatica on top of his baseline COPD.  He takes albuterol and Stiolto at home.  He denies fever, cough, sweats.  He feels hot, which his wife says is very unusual as he is usually cold.   Pertinent  Medical History  COPD, chronic respiratory failure with hypoxia Hypothyroidism nephrolithiasis  Significant Hospital Events: Including procedures, antibiotic start and stop dates in addition to other pertinent events   7/10 admitted  Interim History / Subjective:    Objective   Blood pressure (!) 113/50, pulse 93, temperature (!) 100.7 F (38.2 C), temperature source Rectal, resp. rate (!) 43, SpO2 100 %.    FiO2 (%):  [40 %] 40 %   Intake/Output Summary (Last 24 hours) at 01/10/2022 2108 Last data filed at 01/14/2022 1837 Gross per 24 hour  Intake 1350 ml  Output --  Net 1350 ml   There were no vitals filed for this visit.  Examination: General: Ill-appearing man sitting up in bed no acute distress HENT: Multiple bandages on his scalp, eyes anicteric, BiPAP mask in place Lungs: Coarse rales bilaterally, no wheezing.  Using accessory muscles, tachypneic on BiPAP Cardiovascular:  S1-S2, regular rhythm, tachycardic Abdomen: Soft, nontender, nondistended, right inguinal hernia-she says this is chronic Extremities: L>R ankle edema Neuro: Awake, alert, not moving much due to shortness of breath.  Comprehension appears intact.  Not attempting to talk. GU: Foley with minimal urine output  Troponin 80> 192 Lactic acid 2.4> 2.6 BNP 169 WBC 19.8 Is a 10 and 0.14 D-dimer 2.68 BUN 32 Creatinine 1.75 CXR personally reviewed-possible right upper lobe, left lower lobe infiltrate EKG> sinus tachycardia normal axis.  Delayed R wave progression across precordial's.  Wandering baseline due to chest wall motion, no clear ischemic changes.   Resolved Hospital Problem list     Assessment & Plan:  Acute on chronic hypoxic and hypercapnic respiratory failure Acute COPD exacerbation Concern for community-acquired pneumonia versus PE -CTA rule out PE - Empiric heparin - Empiric antibiotics for CAP -BiPAP for respiratory support.  I had a discussion regarding his wishes with his daughter and wife.  He has recently determined as an outpatient that he wanted to be DNR.  We discussed with this meant.  We also discussed that he would be unlikely to have a short course of mechanical ventilation and my worry would be getting him back to his previous functional status with his declining and poor baseline functional status.  His family agrees.  If he gets to the point that he cannot be supported with BiPAP, we will transition to comfort  Lactic acidosis-unclear if this is due to sepsis, hypoxia and excessive  respiratory muscle use versus poor perfusion from a PE - CTA to rule out PE - Hold on additional fluids at this point due to concern for cardiac etiology or worsening of his ventilatory status with pulmonary edema -Antibiotics - BiPAP for respiratory support - Continue to trend -Blood cultures, urine culture  Troponin and BNP elevation - No ischemic changes on EKG - Empiric  heparin - Continue to trend - Monitor on telemetry  AKI on CKD3a -Strict I's/O - Renally dose meds and avoid nephrotoxic meds - Continue Foley - Check UA and urine culture     Best Practice (right click and "Reselect all SmartList Selections" daily)   Diet/type: NPO DVT prophylaxis: systemic heparin GI prophylaxis: N/A Lines: N/A Foley:  Yes, and it is still needed Code Status:  DNR Last date of multidisciplinary goals of care discussion [-daughter updated at bedside 7/10]  Labs   CBC: Recent Labs  Lab 01/12/2022 1629  WBC 19.8*  NEUTROABS 16.7*  HGB 15.7  HCT 49.1  MCV 94.4  PLT 263    Basic Metabolic Panel: Recent Labs  Lab 12/27/2021 1629 01/07/2022 1900  NA 140  --   K 4.3  --   CL 105  --   CO2 26  --   GLUCOSE 155*  --   BUN 32*  --   CREATININE 1.75*  --   CALCIUM 9.2  --   MG  --  1.7  PHOS  --  4.4   GFR: CrCl cannot be calculated (Unknown ideal weight.). Recent Labs  Lab 12/27/2021 1629 01/10/2022 1707 12/27/2021 1829  PROCALCITON  --   --  0.14  WBC 19.8*  --   --   LATICACIDVEN  --  2.4* 2.6*    Liver Function Tests: Recent Labs  Lab 01/13/2022 1629 01/02/2022 1829  AST 26 21  ALT 22 18  ALKPHOS 192* 152*  BILITOT 0.8 0.9  PROT 6.5 5.4*  ALBUMIN 3.3* 2.5*   No results for input(s): "LIPASE", "AMYLASE" in the last 168 hours. No results for input(s): "AMMONIA" in the last 168 hours.  ABG    Component Value Date/Time   PHART 7.32 (L) 01/10/2022 1629   PCO2ART 46 01/07/2022 1629   PO2ART 210 (H) 12/24/2021 1629   HCO3 23.7 01/01/2022 1629   TCO2 26 05/20/2018 1157   ACIDBASEDEF 2.8 (H) 12/26/2021 1629   O2SAT 99.9 01/14/2022 1629     Coagulation Profile: Recent Labs  Lab 12/26/2021 1829  INR 1.1    Cardiac Enzymes: Recent Labs  Lab 01/06/2022 1900  CKTOTAL 73    HbA1C: Hgb A1c MFr Bld  Date/Time Value Ref Range Status  06/03/2014 11:59 PM 6.5 (H) <5.7 % Final    Comment:    (NOTE)                                                                        According to the ADA Clinical Practice Recommendations for 2011, when HbA1c is used as a screening test:  >=6.5%   Diagnostic of Diabetes Mellitus           (if abnormal result is confirmed) 5.7-6.4%   Increased risk of developing Diabetes Mellitus References:Diagnosis and Classification of Diabetes Mellitus,Diabetes Care,2011,34(Suppl 1):S62-S69  and Standards of Medical Care in         Diabetes - 2011,Diabetes QMVH,8469,62 (Suppl 1):S11-S61.     CBG: No results for input(s): "GLUCAP" in the last 168 hours.  Review of Systems:   Limited due to respiratory status  Past Medical History:  He,  has a past medical history of Arthritis, Cancer ([place]), COPD (chronic obstructive pulmonary disease) ([place]), History of kidney stones, History of oxygen administration, Hyperlipidemia, Hypothyroidism, Pulmonary nodule, right, Skin cancer (09/06/2016), and Tobacco abuse.   Surgical History:   Past Surgical History:  Procedure Laterality Date   CATARACT EXTRACTION, BILATERAL     CONVERSION TO TOTAL HIP Left 04/28/2015   Procedure: LEFT HIP CONVERSION PREVIOUS HIP SURGERY TO TOTAL HIP ARTHROPLASTY (ANTERIOR);  Surgeon: Paralee Cancel, MD;  Location: [facility];  Service: Orthopedics;  Laterality: Left;   electronavigational bronchoscopy  07/2010   HIP PINNING,CANNULATED Left 06/04/2014   Procedure: LEFT HIP REDUCTION WITH PERCUTANEOUS SCREWS;  Surgeon: Mauri Pole, MD;  Location: [facility];  Service: Orthopedics;  Laterality: Left;   TRACHEOSTOMY     after aspiration event as a 88 y/o     Social History:   reports that he quit smoking about 10 years ago. His smoking use included cigarettes. He has a 55.00 pack-year smoking history. He has never used smokeless tobacco. He reports current alcohol use. He reports that he does not use drugs.   Family History:  His family history includes Cancer in his father; Heart disease in his mother.   Allergies Allergies  Allergen  Reactions   Sulfa Antibiotics    Sulfonamide Derivatives     Unknown - as a child     Home Medications  Prior to Admission medications   Medication Sig Start Date End Date Taking? Authorizing Provider  albuterol (PROVENTIL HFA;VENTOLIN HFA) 108 (90 BASE) MCG/ACT inhaler Inhale 2 puffs into the lungs every 4 (four) hours as needed for wheezing or shortness of breath.    [provider]  albuterol (PROVENTIL) (2.5 MG/3ML) 0.083% nebulizer solution Take 3 mLs (2.5 mg total) by nebulization 3 (three) times daily. J43.9 10/21/21   [name], MD  COVID-19 mRNA bivalent vaccine, Pfizer, (PFIZER COVID-19 VAC BIVALENT) injection Inject into the muscle. 03/23/21   Carlyle Basques, MD  COVID-19 mRNA Vac-TriS, Pfizer, (PFIZER-BIONT COVID-19 VAC-TRIS) SUSP injection Inject into the muscle. 11/27/20   Carlyle Basques, MD  guaiFENesin (MUCINEX) 600 MG 12 hr tablet Take 2 tablets (1,200 mg total) by mouth 2 (two) times daily as needed. 07/29/16   [name], MD  levalbuterol Penne Lash) 0.63 MG/3ML nebulizer solution Take 3 mLs (0.63 mg total) by nebulization every 6 (six) hours as needed for wheezing or shortness of breath. 10/16/21   [name], MD  levothyroxine (SYNTHROID, LEVOTHROID) 50 MCG tablet Take 50 mcg by mouth daily before breakfast.     [provider]  lidocaine (LIDODERM) 5 % Place 1 patch onto the skin daily. Remove & Discard patch within 12 hours or as directed by MD 11/21/21   Hendricks Limes, PA-C  mometasone [facility]) 220 MCG/INH inhaler Inhale 2 puffs into the lungs daily.    [provider]  naproxen (NAPROSYN) 375 MG tablet Take 1 tablet (375 mg total) by mouth 2 (two) times daily. 11/21/21   Myna Bright M, PA-C  predniSONE (DELTASONE) 5 MG tablet TAKE 1/2 TABLET EVERY DAY FOR BREAKFAST 11/05/21   [name], MD  Tiotropium Bromide-Olodaterol (STIOLTO RESPIMAT) 2.5-2.5 MCG/ACT AERS Inhale 2 puffs into  the lungs daily. 06/07/18   Martyn Ehrich, NP      Critical care time: 50 min.    Julian Hy, DO 01/11/2022 9:21 PM [facility]

## 2021-12-28 NOTE — Progress Notes (Signed)
Pt came off bipap at 1810

## 2021-12-28 NOTE — Subjective & Objective (Signed)
Presents with SOB has a skin cancer removed on hi scalp And gotten very sob Was sent to ER he has been coughing for a few days

## 2021-12-28 NOTE — Progress Notes (Signed)
eLink Physician-Brief Progress Note Patient Name: [name] DOB: 1928/06/09 MRN: [id number]   Date of Service  01/11/2022  HPI/Events of Note  BP 68/52. BNP 169. Lactic acid 2.6. Patient is on BIPAP.  eICU Interventions  LR  250 ml iv fluid bolus x1, Norepinephrine gtt (peripheral protocol),  discontinue Lasix order.        Kerry Kass Abednego Yeates 01/13/2022, 10:38 PM

## 2021-12-28 NOTE — ED Notes (Signed)
Family wants passcode to be [id number] so if anyone has this code they can get information on patient

## 2021-12-28 NOTE — ED Provider Notes (Signed)
[facility] DEPT Provider Note   CSN: [id number] Arrival date & time: 01/04/2022  1604     History  Chief Complaint  Patient presents with   Shortness of Breath    [name] is a 88 y.o. male.  HPI 88 year old male with a history of COPD presents with shortness of breath.  Started acutely this morning after having skin cancer removed from his scalp.  Previous to this he states he was not feeling bad.  He is feeling severely short of breath.  He is currently on a nonrebreather.  He wears 2 L of oxygen at night.  No chest pain or leg swelling. A little bit of a nonproductive cough.  Home Medications Prior to Admission medications   Medication Sig Start Date End Date Taking? Authorizing Provider  albuterol (PROVENTIL HFA;VENTOLIN HFA) 108 (90 BASE) MCG/ACT inhaler Inhale 2 puffs into the lungs every 4 (four) hours as needed for wheezing or shortness of breath.    [provider]  albuterol (PROVENTIL) (2.5 MG/3ML) 0.083% nebulizer solution Take 3 mLs (2.5 mg total) by nebulization 3 (three) times daily. J43.9 10/21/21   [name], MD  COVID-19 mRNA bivalent vaccine, Pfizer, (PFIZER COVID-19 VAC BIVALENT) injection Inject into the muscle. 03/23/21   Carlyle Basques, MD  COVID-19 mRNA Vac-TriS, Pfizer, (PFIZER-BIONT COVID-19 VAC-TRIS) SUSP injection Inject into the muscle. 11/27/20   Carlyle Basques, MD  guaiFENesin (MUCINEX) 600 MG 12 hr tablet Take 2 tablets (1,200 mg total) by mouth 2 (two) times daily as needed. 07/29/16   [name], MD  levalbuterol Penne Lash) 0.63 MG/3ML nebulizer solution Take 3 mLs (0.63 mg total) by nebulization every 6 (six) hours as needed for wheezing or shortness of breath. 10/16/21   [name], MD  levothyroxine (SYNTHROID, LEVOTHROID) 50 MCG tablet Take 50 mcg by mouth daily before breakfast.     [provider]  lidocaine (LIDODERM) 5 % Place 1 patch onto the skin daily. Remove & Discard patch within 12  hours or as directed by MD 11/21/21   Hendricks Limes, PA-C  mometasone [facility]) 220 MCG/INH inhaler Inhale 2 puffs into the lungs daily.    [provider]  naproxen (NAPROSYN) 375 MG tablet Take 1 tablet (375 mg total) by mouth 2 (two) times daily. 11/21/21   Myna Bright M, PA-C  predniSONE (DELTASONE) 5 MG tablet TAKE 1/2 TABLET EVERY DAY FOR BREAKFAST 11/05/21   [name], MD  Tiotropium Bromide-Olodaterol (STIOLTO RESPIMAT) 2.5-2.5 MCG/ACT AERS Inhale 2 puffs into the lungs daily. 06/07/18   Martyn Ehrich, NP      Allergies    Sulfa antibiotics and Sulfonamide derivatives    Review of Systems   Review of Systems  Unable to perform ROS: Severe respiratory distress    Physical Exam Updated Vital Signs BP (!) 113/50   Pulse 93   Temp (!) 100.7 F (38.2 C) (Rectal)   Resp (!) 43   SpO2 100%  Physical Exam Vitals and nursing note reviewed.  Constitutional:      Appearance: He is well-developed.  HENT:     Head: Normocephalic and atraumatic.  Cardiovascular:     Rate and Rhythm: Regular rhythm. Tachycardia present.     Heart sounds: Normal heart sounds.  Pulmonary:     Effort: Tachypnea and accessory muscle usage present.     Comments: Coarse breath sounds bilaterally Abdominal:     Palpations: Abdomen is soft.     Tenderness: There is no abdominal tenderness.  Musculoskeletal:     Right lower leg: No edema.     Left lower leg: No edema.  Skin:    General: Skin is warm and dry.  Neurological:     Mental Status: He is alert.     ED Results / Procedures / Treatments   Labs (all labs ordered are listed, but only abnormal results are displayed) Labs Reviewed  BLOOD GAS, ARTERIAL - Abnormal; Notable for the following components:      Result Value   pH, Arterial 7.32 (*)    pO2, Arterial 210 (*)    Acid-base deficit 2.8 (*)    All other components within normal limits  COMPREHENSIVE METABOLIC PANEL - Abnormal; Notable for the following components:    Glucose, Bld 155 (*)    BUN 32 (*)    Creatinine, Ser 1.75 (*)    Albumin 3.3 (*)    Alkaline Phosphatase 192 (*)    GFR, Estimated 36 (*)    All other components within normal limits  BRAIN NATRIURETIC PEPTIDE - Abnormal; Notable for the following components:   B Natriuretic Peptide 169.0 (*)    All other components within normal limits  CBC WITH DIFFERENTIAL/PLATELET - Abnormal; Notable for the following components:   WBC 19.8 (*)    Neutro Abs 16.7 (*)    Monocytes Absolute 1.7 (*)    Abs Immature Granulocytes 0.11 (*)    All other components within normal limits  LACTIC ACID, PLASMA - Abnormal; Notable for the following components:   Lactic Acid, Venous 2.4 (*)    All other components within normal limits  LACTIC ACID, PLASMA - Abnormal; Notable for the following components:   Lactic Acid, Venous 2.6 (*)    All other components within normal limits  HEPATIC FUNCTION PANEL - Abnormal; Notable for the following components:   Total Protein 5.4 (*)    Albumin 2.5 (*)    Alkaline Phosphatase 152 (*)    All other components within normal limits  TROPONIN I (HIGH SENSITIVITY) - Abnormal; Notable for the following components:   Troponin I (High Sensitivity) 80 (*)    All other components within normal limits  TROPONIN I (HIGH SENSITIVITY) - Abnormal; Notable for the following components:   Troponin I (High Sensitivity) 192 (*)    All other components within normal limits  RESP PANEL BY RT-PCR (FLU A&B, COVID) ARPGX2  CULTURE, BLOOD (ROUTINE X 2)  CULTURE, BLOOD (ROUTINE X 2)  EXPECTORATED SPUTUM ASSESSMENT W GRAM STAIN, RFLX TO RESP C  URINE CULTURE  PROCALCITONIN  CK  TSH  PHOSPHORUS  MAGNESIUM  PROTIME-INR  APTT  URINALYSIS, ROUTINE W REFLEX MICROSCOPIC  CREATININE, URINE, RANDOM  PREALBUMIN  SODIUM, URINE, RANDOM  OSMOLALITY, URINE  OSMOLALITY  LACTIC ACID, PLASMA  LACTIC ACID, PLASMA  LEGIONELLA PNEUMOPHILA SEROGP 1 UR AG  STREP PNEUMONIAE URINARY ANTIGEN   D-DIMER, QUANTITATIVE  BASIC METABOLIC PANEL    EKG EKG Interpretation  Date/Time:  Monday December 28 2021 16:25:07 EDT Ventricular Rate:  128 PR Interval:  161 QRS Duration: 94 QT Interval:  291 QTC Calculation: 432 R Axis:   56 Text Interpretation: Sinus tachycardia Multiform ventricular premature complexes Consider right atrial enlargement Minimal ST depression, inferior leads Confirmed by Sherwood Gambler [id number]) on 01/10/2022 4:36:21 PM  Radiology DG Chest Portable 1 View  Result Date: 01/12/2022 CLINICAL DATA:  Respiratory distress EXAM: PORTABLE CHEST 1 VIEW COMPARISON:  11/27/2020 FINDINGS: Normal heart size. Aortic atherosclerosis. Mildly hyperinflated lungs. Increased left basilar interstitial markings with  small left pleural effusion. No pneumothorax. IMPRESSION: Increased left basilar interstitial markings with small left pleural effusion, concerning for developing infiltrate. Electronically Signed   By: Davina Poke D.O.   On: 01/17/2022 17:04    Procedures .Critical Care  Performed by: Sherwood Gambler, MD Authorized by: Sherwood Gambler, MD   Critical care provider statement:    Critical care time (minutes):  40   Critical care time was exclusive of:  Separately billable procedures and treating other patients   Critical care was necessary to treat or prevent imminent or life-threatening deterioration of the following conditions:  Respiratory failure and sepsis   Critical care was time spent personally by me on the following activities:  Development of treatment plan with patient or surrogate, discussions with consultants, evaluation of patient's response to treatment, examination of patient, ordering and review of laboratory studies, ordering and review of radiographic studies, ordering and performing treatments and interventions, pulse oximetry, re-evaluation of patient's condition and review of old charts     Medications Ordered in ED Medications  cefTRIAXone  (ROCEPHIN) 2 g in sodium chloride 0.9 % 100 mL IVPB (has no administration in time range)  azithromycin (ZITHROMAX) 500 mg in sodium chloride 0.9 % 250 mL IVPB (has no administration in time range)  morphine (PF) 2 MG/ML injection 1 mg (has no administration in time range)  albuterol (PROVENTIL) (2.5 MG/3ML) 0.083% nebulizer solution 2.5 mg (2.5 mg Nebulization Given 01/09/2022 1649)  ipratropium-albuterol (DUONEB) 0.5-2.5 (3) MG/3ML nebulizer solution 3 mL (3 mLs Nebulization Given 01/08/2022 1649)  cefTRIAXone (ROCEPHIN) 1 g in sodium chloride 0.9 % 100 mL IVPB (0 g Intravenous Stopped 12/22/2021 1830)  azithromycin (ZITHROMAX) 500 mg in sodium chloride 0.9 % 250 mL IVPB (0 mg Intravenous Stopped 01/14/2022 1837)  sodium chloride 0.9 % bolus 1,000 mL (0 mLs Intravenous Stopped 12/27/2021 1837)  methylPREDNISolone sodium succinate (SOLU-MEDROL) 125 mg/2 mL injection 125 mg (125 mg Intravenous Given 12/20/2021 1845)  albuterol (PROVENTIL) (2.5 MG/3ML) 0.083% nebulizer solution 5 mg (5 mg Nebulization Given 01/16/2022 1800)  sterile water (preservative free) injection (10 mLs  Given 01/11/2022 1845)  acetaminophen (TYLENOL) suppository 650 mg (650 mg Rectal Given 01/13/2022 2006)    ED Course/ Medical Decision Making/ A&P Clinical Course as of 01/06/2022 2023  Mon Dec 28, 2021  1710 Chest x-ray image viewed by myself.  Appears to have probable left lower lobe pneumonia.  In correlation with his degree of illness and leukocytosis of 19.8, probably has pneumonia.  We will start Rocephin and azithromycin. [SG]  2016 I discussed with Dr. Roel Cluck.  Patient went back on BiPAP and is still quite tachypneic with increased work of breathing.  Not want to be intubated at this point despite my earlier talk with him.  She is asking for PCCM consult and likely admission.  Will consult them. [SG]    Clinical Course User Index [SG] Sherwood Gambler, MD                           Medical Decision Making Problems Addressed: Acute  respiratory failure, unspecified whether with hypoxia or hypercapnia [facility]): acute illness or injury that poses a threat to life or bodily functions Community acquired pneumonia of left lower lobe of lung: acute illness or injury with systemic symptoms that poses a threat to life or bodily functions Severe sepsis [facility]): acute illness or injury that poses a threat to life or bodily functions  Amount and/or Complexity of Data Reviewed  Labs: ordered.    Details: Leukocytosis of almost 20,000.  Lactate of 2.4.  Acute kidney injury. Radiology: ordered and independent interpretation performed.    Details: Chest x-ray with left lower lobe pneumonia. ECG/medicine tests: ordered and independent interpretation performed.    Details: Sinus tachycardia.  Risk Prescription drug management. Decision regarding hospitalization.   As above, patient treated for sepsis and pneumonia.  Given Rocephin, azithromycin, IV fluids.  After initial consultation to hospitalist he also developed a fever and she has ordered rectal Tylenol.  He had to go back on BiPAP.  Originally had asked to come off BiPAP but still quite tachypneic and working to breathe.  Dr. Carlis Abbott from intensivist service has been consulted and she will admit.        Final Clinical Impression(s) / ED Diagnoses Final diagnoses:  Acute respiratory failure, unspecified whether with hypoxia or hypercapnia ([place])  Community acquired pneumonia of left lower lobe of lung  Severe sepsis [facility])    Rx / DC Orders ED Discharge Orders     None         Sherwood Gambler, MD 12/24/2021 2101

## 2021-12-28 NOTE — Progress Notes (Signed)
US guided PIV for Vasopressor attempted x2  without success. RN aware.

## 2021-12-28 NOTE — Progress Notes (Signed)
[facility] Progress Note Patient Name: [name] DOB: 10-25-1927 MRN: [id number]   Date of Service  01/17/2022  HPI/Events of Note  Blood pressure remained very low (SBP 60's) after 250 ml iv LR bolus, so an additional 500 ml bolus was ordered and peripheral Norepinephrine gtt ordered, BP now improved to SBP 103, arterial line ordered. Levo currently at 15 mcg.  eICU Interventions  Peripheral Phenylephrine gtt ordered to allow weaning Levophed below the 10 mcg peripheral protocol threshold.        [name] 12/30/2021, 11:23 PM

## 2021-12-28 NOTE — Progress Notes (Signed)
eLink Physician-Brief Progress Note Patient Name: [name] DOB: 09-Feb-1928 MRN: [id number]   Date of Service  01/06/2022  HPI/Events of Note  Patient admitted with acute on chronic hypoxemic respiratory failure in the context of advanced COPD, probable pneumonia, sepsis, and AKI on CKD. Lactic acid level is 2.6.  eICU Interventions  New Patient Evaluation, gentle hydration, cultures + antibiotics, BIPAP.        [name] 01/17/2022, 10:15 PM

## 2021-12-28 NOTE — H&P (Signed)
[name] BPZ:[id number] DOB: 1927/10/14 DOA: 01/18/2022     PCP: [name], MD   Outpatient Specialists:  CARDS:  Dr. Noni Saupe NEphrology: *  Dr. NEurology *   Dr. Pulmonary *  Dr. Halford Chessman  Oncology * Dr. Fabienne Bruns* Dr.  Sadie Haber, [facility]) No care team member to display Urology Dr. *  Patient arrived to ER on 01/05/2022 at 1604 Referred by Attending Sherwood Gambler, MD   Patient coming from:    home Lives alone,   *** With family From facility ***  Chief Complaint:   Chief Complaint  Patient presents with   Shortness of Breath    HPI: [name] is a 88 y.o. male with medical history significant of squamous cell carcinoma hypertension hypothyroidism COPD nonsustained V. tach    Presented with   shortness of breath after having a procedure done Presents with SOB has a skin cancer removed on hi scalp And gotten very sob Was sent to ER he has been coughing for a few days      Initial COVID TEST  NEGATIVE**** POSITIVE,  ***in house  PCR testing  Pending  Lab Results  Component Value Date   [place] NEGATIVE 01/05/2022     Regarding pertinent Chronic problems: ***  ****Hyperlipidemia - *on statins {statin:315258}  Lipid Panel     Component Value Date/Time   CHOL  12/08/2006 0535    114        ATP III CLASSIFICATION:  <200     mg/dL   Desirable  200-239  mg/dL   Borderline High  >=240    mg/dL   High   TRIG 48 12/08/2006 0535   HDL 29 (L) 12/08/2006 0535   CHOLHDL 3.9 12/08/2006 0535   VLDL 10 12/08/2006 0535   [place]  12/08/2006 0535    75        Total Cholesterol/HDL:CHD Risk Coronary Heart Disease Risk Table                     Men   Women  1/2 Average Risk   3.4   3.3    ***HTN on   ***chronic CHF diastolic/systolic/ combined - last echo***  *** CAD  - On Aspirin, statin, betablocker, Plavix                 - *followed by cardiology                - last cardiac cath  The ASCVD Risk score (Arnett DK, et al., 2019) failed  to calculate for the following reasons:   The 2019 ASCVD risk score is only valid for ages 35 to 54    ***DM 2 -  Lab Results  Component Value Date   HGBA1C 6.5 (H) 06/03/2014   ****on insulin, PO meds only, diet controlled  ***Hypothyroidism:  Lab Results  Component Value Date   TSH 1.220 06/04/2014   on synthroid  *** Morbid obesity-   BMI Readings from Last 1 Encounters:  11/21/21 24.66 kg/m     *** Asthma -well *** controlled on home inhalers/ nebs f                        ***last no prior***admission  ***                       No ***history of intubation  *** COPD - not **followed by pulmonology ***  not  on baseline oxygen  *L,    *** OSA -on nocturnal oxygen, *CPAP, *noncompliant with CPAP  *** Hx of CVA - *with/out residual deficits on Aspirin 81 mg, 325, Plavix  ***A. Fib -  - CHA2DS2 vas score **** CHA2DS2/VAS Stroke Risk Points      N/A >= 2 Points: High Risk  1 - 1.99 Points: Medium Risk  0 Points: Low Risk    Last Change: N/A      This score determines the patient's risk of having a stroke if the  patient has atrial fibrillation.      This score is not applicable to this patient. Components are not  calculated.     current  on anticoagulation with ****Coumadin  ***Xarelto,* Eliquis,  *** Not on anticoagulation secondary to Risk of Falls, *** recurrent bleeding         -  Rate control:  Currently controlled with ***Toprolol,  *Metoprolol,* Diltiazem, *Coreg          - Rhythm control: *** amiodarone, *flecainide  ***Hx of DVT/PE on - anticoagulation with ****Coumadin  ***Xarelto,* Eliquis,   ***CKD stage III*- baseline Cr **** CrCl cannot be calculated (Unknown ideal weight.).  Lab Results  Component Value Date   CREATININE 1.75 (H) 12/28/2021   CREATININE 1.19 05/22/2018   CREATININE 1.27 (H) 05/21/2018     **** Liver disease Computed MELD 3.0 unavailable. Necessary lab results were not found in the last year. Computed MELD-Na unavailable.  Necessary lab results were not found in the last year.    ***BPH - on Flomax, Proscar    *** Dementia - on Aricept** Nemenda  *** Chronic anemia - baseline hg Hemoglobin & Hematocrit  Recent Labs    12/23/2021 1629  HGB 15.7     While in ER: Clinical Course as of 01/10/2022 1832  Mon Dec 28, 2021  1710 Chest x-ray image viewed by myself.  Appears to have probable left lower lobe pneumonia.  In correlation with his degree of illness and leukocytosis of 19.8, probably has pneumonia.  We will start Rocephin and azithromycin. [SG]    Clinical Course User Index [SG] Sherwood Gambler, MD     Briefly requiring a BiPAP chest x-ray worrisome for pneumonia start Rocephin azithromycin  Ordered  CT HEAD *** NON acute  CXR - Increased left basilar interstitial markings with small left pleural effusion, concerning for developing infiltrate.  CTabd/pelvis - ***nonacute  CTA chest - ***nonacute, no PE, * no evidence of infiltrate  Following Medications were ordered in ER: Medications  azithromycin (ZITHROMAX) 500 mg in sodium chloride 0.9 % 250 mL IVPB (500 mg Intravenous New Bag/Given 12/23/2021 1733)  methylPREDNISolone sodium succinate (SOLU-MEDROL) 125 mg/2 mL injection 125 mg (has no administration in time range)  albuterol (PROVENTIL) (2.5 MG/3ML) 0.083% nebulizer solution 2.5 mg (2.5 mg Nebulization Given 01/15/2022 1649)  ipratropium-albuterol (DUONEB) 0.5-2.5 (3) MG/3ML nebulizer solution 3 mL (3 mLs Nebulization Given 12/23/2021 1649)  cefTRIAXone (ROCEPHIN) 1 g in sodium chloride 0.9 % 100 mL IVPB (0 g Intravenous Stopped 01/05/2022 1830)  sodium chloride 0.9 % bolus 1,000 mL (1,000 mLs Intravenous New Bag/Given (Non-Interop) 01/02/2022 1749)  albuterol (PROVENTIL) (2.5 MG/3ML) 0.083% nebulizer solution 5 mg (5 mg Nebulization Given 12/24/2021 1800)    _______________________________________________________ ER Provider Called:     DrMarland Kitchen  They Recommend admit to medicine *** Will see in AM   ***SEEN in ER   ED Triage Vitals  Enc Vitals Group     BP 01/05/2022  1615 (!) 188/113     Pulse Rate 01/01/2022 1618 (!) 102     Resp 01/10/2022 1617 (!) 36     Temp 01/11/2022 1634 97.7 F (36.5 C)     Temp Source 01/12/2022 1634 Oral     SpO2 01/07/2022 1618 100 %     Weight --      Height --      Head Circumference --      Peak Flow --      Pain Score 01/16/2022 1645 0     Pain Loc --      Pain Edu? --      Excl. in [place]? --   TMAX(24)@     _________________________________________ Significant initial  Findings: Abnormal Labs Reviewed  BLOOD GAS, ARTERIAL - Abnormal; Notable for the following components:      Result Value   pH, Arterial 7.32 (*)    pO2, Arterial 210 (*)    Acid-base deficit 2.8 (*)    All other components within normal limits  COMPREHENSIVE METABOLIC PANEL - Abnormal; Notable for the following components:   Glucose, Bld 155 (*)    BUN 32 (*)    Creatinine, Ser 1.75 (*)    Albumin 3.3 (*)    Alkaline Phosphatase 192 (*)    GFR, Estimated 36 (*)    All other components within normal limits  BRAIN NATRIURETIC PEPTIDE - Abnormal; Notable for the following components:   B Natriuretic Peptide 169.0 (*)    All other components within normal limits  CBC WITH DIFFERENTIAL/PLATELET - Abnormal; Notable for the following components:   WBC 19.8 (*)    Neutro Abs 16.7 (*)    Monocytes Absolute 1.7 (*)    Abs Immature Granulocytes 0.11 (*)    All other components within normal limits  LACTIC ACID, PLASMA - Abnormal; Notable for the following components:   Lactic Acid, Venous 2.4 (*)    All other components within normal limits  TROPONIN I (HIGH SENSITIVITY) - Abnormal; Notable for the following components:   Troponin I (High Sensitivity) 80 (*)    All other components within normal limits     _________________________ Troponin ***ordered ECG: Ordered Personally reviewed and interpreted by me showing: HR : *** Rhythm: *NSR, Sinus tachycardia * A.fib. W RVR, RBBB, LBBB,  Paced Ischemic changes*nonspecific changes, no evidence of ischemic changes QTC*   ____________________ This patient meets SIRS Criteria and may be septic. SIRS = Systemic Inflammatory Response Syndrome  Order a lactic acid level if needed AND/OR Initiate the sepsis protocol with the attached order set OR Click "Treating Associated Infection or Illness" if the patient is being treated for an infection that is a known cause of these abnormalities     The recent clinical data is shown below. Vitals:   12/26/2021 1734 01/12/2022 1745 12/27/2021 1800 12/25/2021 1830  BP:  138/88 (!) 145/124   Pulse:  (!) 125 (!) 120   Resp: (!) 42 (!) 40 (!) 47   Temp:      TempSrc:      SpO2:  100% 100% 100%       WBC     Component Value Date/Time   WBC 19.8 (H) 12/22/2021 1629   LYMPHSABS 1.2 01/08/2022 1629   MONOABS 1.7 (H) 12/27/2021 1629   EOSABS 0.0 12/24/2021 1629   BASOSABS 0.0 12/28/2021 1629        Lactic Acid, Venous    Component Value Date/Time   LATICACIDVEN 2.4 (HH) 01/13/2022 1707  Procalcitonin *** Ordered Lactic Acid, Venous    Component Value Date/Time   LATICACIDVEN 2.4 (HH) 12/27/2021 1707        UA *** no evidence of UTI  ***Pending ***not ordered   Urine analysis:    Component Value Date/Time   COLORURINE YELLOW 04/16/2015 [place] 04/16/2015 1513   LABSPEC 1.013 04/16/2015 1513   PHURINE 7.0 04/16/2015 1513   GLUCOSEU NEGATIVE 04/16/2015 1513   HGBUR NEGATIVE 04/16/2015 1513   [place] 04/16/2015 1513   KETONESUR NEGATIVE 04/16/2015 1513   PROTEINUR NEGATIVE 04/16/2015 1513   UROBILINOGEN 1.0 04/16/2015 1513   NITRITE NEGATIVE 04/16/2015 1513   LEUKOCYTESUR NEGATIVE 04/16/2015 1513    Results for orders placed or performed during the hospital encounter of 01/12/2022  Resp Panel by RT-PCR (Flu A&B, Covid) Anterior Nasal Swab     Status: None   Collection Time: 01/08/2022  4:29 PM   Specimen: Anterior Nasal Swab   Result Value Ref Range Status   SARS Coronavirus 2 by RT PCR NEGATIVE NEGATIVE Final         Influenza A by PCR NEGATIVE NEGATIVE Final   Influenza B by PCR NEGATIVE NEGATIVE Final           _______________________________________________ Hospitalist was called for admission for CAP and sepsis   The following Work up has been ordered so far:  Orders Placed This Encounter  Procedures   Resp Panel by RT-PCR (Flu A&B, Covid) Anterior Nasal Swab   Culture, blood (routine x 2)   DG Chest Portable 1 View   Urinalysis, Routine w reflex microscopic   Blood gas, arterial   Comprehensive metabolic panel   Brain natriuretic peptide   CBC with Differential   Lactic acid, plasma   Cardiac monitoring   DO NOT delay antibiotics if unable to obtain blood culture.   Code Sepsis activation.  This occurs automatically when order is signed and prioritizes pharmacy, lab, and radiology services for STAT collections and interventions.  If [facility] downtime, call [facility] [phone]) to activate Code Sepsis.   Consult to hospitalist   Bipap   Pulse oximetry, continuous   EKG 12-Lead   ED EKG   Saline lock IV   Insert 2nd peripheral IV if not already present.     OTHER Significant initial  Findings:  labs showing:    Recent Labs  Lab 01/01/2022 1629  NA 140  K 4.3  CO2 26  GLUCOSE 155*  BUN 32*  CREATININE 1.75*  CALCIUM 9.2    Cr    Up from baseline see below Lab Results  Component Value Date   CREATININE 1.75 (H) 12/25/2021   CREATININE 1.19 05/22/2018   CREATININE 1.27 (H) 05/21/2018    Recent Labs  Lab 01/11/2022 1629  AST 26  ALT 22  ALKPHOS 192*  BILITOT 0.8  PROT 6.5  ALBUMIN 3.3*   Lab Results  Component Value Date   CALCIUM 9.2 01/11/2022          Plt: Lab Results  Component Value Date   PLT 378 12/27/2021       COVID-19 Labs  No results for input(s): "DDIMER", "FERRITIN", "LDH", "CRP" in the last 72 hours.  Lab Results  Component Value Date    SARSCOV2NAA NEGATIVE 12/26/2021     ABG    Component Value Date/Time   PHART 7.32 (L) 12/27/2021 1629   PCO2ART 46 01/09/2022 1629   PO2ART 210 (H) 01/02/2022 1629   HCO3 23.7 01/04/2022 1629  TCO2 26 05/20/2018 1157   ACIDBASEDEF 2.8 (H) 01/05/2022 1629   O2SAT 99.9 01/07/2022 1629         Recent Labs  Lab 01/17/2022 1629  WBC 19.8*  NEUTROABS 16.7*  HGB 15.7  HCT 49.1  MCV 94.4  PLT 378    HG/HCT  stable,       Component Value Date/Time   HGB 15.7 01/12/2022 1629   HCT 49.1 01/12/2022 1629   MCV 94.4 12/27/2021 1629       Cardiac Panel (last 3 results) No results for input(s): "CKTOTAL", "CKMB", "TROPONINI", "RELINDX" in the last 72 hours.  .car BNP (last 3 results) Recent Labs    12/25/2021 1629  BNP 169.0*      DM  labs:  HbA1C: No results for input(s): "HGBA1C" in the last 8760 hours.     CBG (last 3)  No results for input(s): "GLUCAP" in the last 72 hours.        Cultures:    Component Value Date/Time   SDES URINE, CLEAN CATCH 06/07/2014 1639   SPECREQUEST NONE 06/07/2014 1639   CULT  06/07/2014 1639    INSIGNIFICANT GROWTH Performed at [place] 06/08/2014 FINAL 06/07/2014 1639     Radiological Exams on Admission: DG Chest Portable 1 View  Result Date: 01/01/2022 CLINICAL DATA:  Respiratory distress EXAM: PORTABLE CHEST 1 VIEW COMPARISON:  11/27/2020 FINDINGS: Normal heart size. Aortic atherosclerosis. Mildly hyperinflated lungs. Increased left basilar interstitial markings with small left pleural effusion. No pneumothorax. IMPRESSION: Increased left basilar interstitial markings with small left pleural effusion, concerning for developing infiltrate. Electronically Signed   By: Davina Poke D.O.   On: 12/23/2021 17:04   _______________________________________________________________________________________________________ Latest  Blood pressure (!) 145/124, pulse (!) 120, temperature 97.7 F (36.5 C),  temperature source Oral, resp. rate (!) 47, SpO2 100 %.   Vitals  labs and radiology finding personally reviewed  Review of Systems:    Pertinent positives include: ***  Constitutional:  No weight loss, night sweats, Fevers, chills, fatigue, weight loss  HEENT:  No headaches, Difficulty swallowing,Tooth/dental problems,Sore throat,  No sneezing, itching, ear ache, nasal congestion, post nasal drip,  Cardio-vascular:  No chest pain, Orthopnea, PND, anasarca, dizziness, palpitations.no Bilateral lower extremity swelling  GI:  No heartburn, indigestion, abdominal pain, nausea, vomiting, diarrhea, change in bowel habits, loss of appetite, melena, blood in stool, hematemesis Resp:  no shortness of breath at rest. No dyspnea on exertion, No excess mucus, no productive cough, No non-productive cough, No coughing up of blood.No change in color of mucus.No wheezing. Skin:  no rash or lesions. No jaundice GU:  no dysuria, change in color of urine, no urgency or frequency. No straining to urinate.  No flank pain.  Musculoskeletal:  No joint pain or no joint swelling. No decreased range of motion. No back pain.  Psych:  No change in mood or affect. No depression or anxiety. No memory loss.  Neuro: no localizing neurological complaints, no tingling, no weakness, no double vision, no gait abnormality, no slurred speech, no confusion  All systems reviewed and apart from [place] all are negative _______________________________________________________________________________________________ Past Medical History:   Past Medical History:  Diagnosis Date   Arthritis    osteoarthritis   Cancer ([place])    skin cancer only.-no melanoma.   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/06/2016   left  ear squamous cell   Tobacco abuse    Quit 09/17/10      Past Surgical History:  Procedure Laterality Date   CATARACT EXTRACTION, BILATERAL     CONVERSION TO TOTAL HIP Left 04/28/2015   Procedure: LEFT HIP CONVERSION PREVIOUS HIP SURGERY TO TOTAL HIP ARTHROPLASTY (ANTERIOR);  Surgeon: Paralee Cancel, MD;  Location: [facility];  Service: Orthopedics;  Laterality: Left;   electronavigational bronchoscopy  07/2010   HIP PINNING,CANNULATED Left 06/04/2014   Procedure: LEFT HIP REDUCTION WITH PERCUTANEOUS SCREWS;  Surgeon: Mauri Pole, MD;  Location: [facility];  Service: Orthopedics;  Laterality: Left;   TRACHEOSTOMY     after aspiration event as a 88 y/o    Social History:  Ambulatory *** independently cane, walker  wheelchair bound, bed bound     reports that he quit smoking about 10 years ago. His smoking use included cigarettes. He has a 55.00 pack-year smoking history. He has never used smokeless tobacco. He reports current alcohol use. He reports that he does not use drugs.     Family History: *** Family History  Problem Relation Age of Onset   Heart disease Mother    Cancer Father    ______________________________________________________________________________________________ Allergies: Allergies  Allergen Reactions   Sulfa Antibiotics    Sulfonamide Derivatives     Unknown - as a child     Prior to Admission medications   Medication Sig Start Date End Date Taking? Authorizing Provider  albuterol (PROVENTIL HFA;VENTOLIN HFA) 108 (90 BASE) MCG/ACT inhaler Inhale 2 puffs into the lungs every 4 (four) hours as needed for wheezing or shortness of breath.    [provider]  albuterol (PROVENTIL) (2.5 MG/3ML) 0.083% nebulizer solution Take 3 mLs (2.5 mg total) by nebulization 3 (three) times daily. J43.9 10/21/21   [name], MD  COVID-19 mRNA bivalent vaccine, Pfizer, (PFIZER COVID-19 VAC BIVALENT) injection Inject into the muscle. 03/23/21   Carlyle Basques, MD   COVID-19 mRNA Vac-TriS, Pfizer, (PFIZER-BIONT COVID-19 VAC-TRIS) SUSP injection Inject into the muscle. 11/27/20   Carlyle Basques, MD  guaiFENesin (MUCINEX) 600 MG 12 hr tablet Take 2 tablets (1,200 mg total) by mouth 2 (two) times daily as needed. 07/29/16   [name], MD  levalbuterol Penne Lash) 0.63 MG/3ML nebulizer solution Take 3 mLs (0.63 mg total) by nebulization every 6 (six) hours as needed for wheezing or shortness of breath. 10/16/21   [name], MD  levothyroxine (SYNTHROID, LEVOTHROID) 50 MCG tablet Take 50 mcg by mouth daily before breakfast.     [provider]  lidocaine (LIDODERM) 5 % Place 1 patch onto the skin daily. Remove & Discard patch within 12 hours or as directed by MD 11/21/21   Hendricks Limes, PA-C  mometasone [facility]) 220 MCG/INH inhaler Inhale 2 puffs into the lungs daily.    [provider]  naproxen (NAPROSYN) 375 MG tablet Take 1 tablet (375 mg total) by mouth 2 (two) times daily. 11/21/21   Myna Bright M, PA-C  predniSONE (DELTASONE) 5 MG tablet TAKE 1/2 TABLET EVERY DAY FOR BREAKFAST 11/05/21   [name], MD  Tiotropium Bromide-Olodaterol (STIOLTO RESPIMAT) 2.5-2.5 MCG/ACT AERS Inhale 2 puffs into the lungs daily. 06/07/18   Martyn Ehrich, NP    ___________________________________________________________________________________________________ Physical Exam:    01/09/2022    6:00 PM 12/31/2021    5:45 PM  12/26/2021    5:31 PM  Vitals with BMI  Systolic 427 062 376  Diastolic 283 88 81  Pulse 120 125 125     1. General:  in No ***Acute distress***increased work of breathing ***complaining of severe pain****agitated * Chronically ill *well *cachectic *toxic acutely ill -appearing 2. Psychological: Alert and *** Oriented 3. Head/ENT:   Moist *** Dry Mucous Membranes                          Head Non traumatic, neck supple                          Normal *** Poor Dentition 4. SKIN: normal *** decreased Skin turgor,  Skin  clean Dry and intact no rash 5. Heart: Regular rate and rhythm no*** Murmur, no Rub or gallop 6. Lungs: ***Clear to auscultation bilaterally, no wheezes or crackles   7. Abdomen: Soft, ***non-tender, Non distended *** obese ***bowel sounds present 8. Lower extremities: no clubbing, cyanosis, no ***edema 9. Neurologically Grossly intact, moving all 4 extremities equally *** strength 5 out of 5 in all 4 extremities cranial nerves II through XII intact 10. MSK: Normal range of motion    Chart has been reviewed  ______________________________________________________________________________________________  Assessment/Plan  ***  Admitted for *** There are no diagnoses linked to this encounter.   Present on Admission: **None**     No problem-specific Assessment & Plan notes found for this encounter.    Other plan as per orders.  DVT prophylaxis:  SCD *** Lovenox       Code Status:    Code Status: Prior FULL CODE *** DNR/DNI ***comfort care as per patient ***family  I had personally discussed CODE STATUS with patient and family* I had spent *min discussing goals of care and CODE STATUS    Family Communication:   Family not at  Bedside  plan of care was discussed on the phone with *** Son, Daughter, Wife, Husband, Sister, Brother , father, mother  Disposition Plan:   *** likely will need placement for rehabilitation                          Back to current facility when stable                            To home once workup is complete and patient is stable  ***Following barriers for discharge:                            Electrolytes corrected                               Anemia corrected                             Pain controlled with PO medications                               Afebrile, white count improving able to transition to PO antibiotics  Will need to be able to tolerate PO                            Will likely need home health, home  O2, set up                           Will need consultants to evaluate patient prior to discharge  ****EXPECT DC tomorrow                    ***Would benefit from PT/OT eval prior to DC  Ordered                   Swallow eval - SLP ordered                   Diabetes care coordinator                   Transition of care consulted                   Nutrition    consulted                  Wound care  consulted                   Palliative care    consulted                   Behavioral health  consulted                    Consults called: ***    Admission status:  ED Disposition     None        Obs***  ***  inpatient     I Expect 2 midnight stay secondary to severity of patient's current illness need for inpatient interventions justified by the following: ***hemodynamic instability despite optimal treatment (tachycardia *hypotension * tachypnea *hypoxia, hypercapnia) * Severe lab/radiological/exam abnormalities including:     and extensive comorbidities including: *substance abuse  *Chronic pain *DM2  * CHF * CAD  * COPD/asthma *Morbid Obesity * CKD *dementia *liver disease *history of stroke with residual deficits *  malignancy, * sickle cell disease  History of amputation Chronic anticoagulation  That are currently affecting medical management.   I expect  patient to be hospitalized for 2 midnights requiring inpatient medical care.  Patient is at high risk for adverse outcome (such as loss of life or disability) if not treated.  Indication for inpatient stay as follows:  Severe change from baseline regarding mental status Hemodynamic instability despite maximal medical therapy,  ongoing suicidal ideations,  severe pain requiring acute inpatient management,  inability to maintain oral hydration   persistent chest pain despite medical management Need for operative/procedural  intervention New or worsening hypoxia   Need for IV antibiotics, IV fluids, IV  rate controling medications, IV antihypertensives, IV pain medications, IV anticoagulation, need for biPAP    Level of care   *** tele  For 12H 24H     medical floor       progressive tele indefinitely please discontinue once patient no longer qualifies COVID-19 Labs    Lab Results  Component Value Date   [place] NEGATIVE 12/30/2021     Precautions: admitted as *** Covid Negative  ***asymptomatic screening protocol****PUI *** covid positive No active isolations ***If Covid PCR is  negative  - please DC precautions - would need additional investigation given very high risk for false native test result    Critical***  Patient is critically ill due to  hemodynamic instability * respiratory failure *severe sepsis* ongoing chest pain*  They are at high risk for life/limb threatening clinical deterioration requiring frequent reassessment and modifications of care.  Services provided include examination of the patient, review of relevant ancillary tests, prescription of lifesaving therapies, review of medications and prophylactic therapy.  Total critical care time excluding separately billable procedures: 60*  Minutes.    [name] 01/04/2022, 6:32 PM ***  [facility]     after 2 AM please page floor coverage PA If 7AM-7PM, please contact the day team taking care of the patient using [email or web address]   Patient was evaluated in the context of the global COVID-19 pandemic, which necessitated consideration that the patient might be at risk for infection with the SARS-CoV-2 virus that causes COVID-19. Institutional protocols and algorithms that pertain to the evaluation of patients at risk for COVID-19 are in a state of rapid change based on information released by regulatory bodies including the CDC and federal and state organizations. These policies and algorithms were followed during the patient's care.

## 2021-12-28 NOTE — Progress Notes (Signed)
Notified provider of need to order fluid bolus for elevated lactic acid and soft BP. Pt currently on BiPAP

## 2021-12-28 NOTE — Progress Notes (Signed)
Pt transported to CT and then to ICU on BIPAp without complication.

## 2021-12-29 DIAGNOSIS — R778 Other specified abnormalities of plasma proteins: Secondary | ICD-10-CM | POA: Diagnosis present

## 2021-12-29 LAB — LACTIC ACID, PLASMA: Lactic Acid, Venous: 8.5 mmol/L (ref 0.5–1.9)

## 2021-12-29 MED ORDER — POLYVINYL ALCOHOL 1.4 % OP SOLN: 1.0000 [drp] | Freq: Four times a day (QID) | OPHTHALMIC | Status: DC | PRN

## 2021-12-29 MED ORDER — LORAZEPAM 2 MG/ML IJ SOLN: 0.5000 mg | INTRAMUSCULAR | Status: DC | PRN

## 2021-12-29 MED ORDER — SODIUM CHLORIDE 0.9 % IV SOLN: INTRAVENOUS | Status: DC

## 2021-12-29 MED ORDER — ACETAMINOPHEN 650 MG RE SUPP: 650.0000 mg | Freq: Four times a day (QID) | RECTAL | Status: DC | PRN

## 2021-12-29 MED ORDER — MORPHINE SULFATE (PF) 2 MG/ML IV SOLN
1.0000 mg | INTRAVENOUS | Status: DC | PRN
  Administered 2021-12-29: 2 mg via INTRAVENOUS
  Filled 2021-12-29: qty 1

## 2021-12-29 MED ORDER — GLYCOPYRROLATE 0.2 MG/ML IJ SOLN: 0.2000 mg | INTRAMUSCULAR | Status: DC | PRN

## 2021-12-29 MED ORDER — GLYCOPYRROLATE 0.2 MG/ML IJ SOLN
0.2000 mg | INTRAMUSCULAR | Status: DC | PRN
  Administered 2021-12-29: 0.2 mg via INTRAVENOUS
  Filled 2021-12-29: qty 1

## 2021-12-29 MED ORDER — GLYCOPYRROLATE 1 MG PO TABS: 1.0000 mg | ORAL_TABLET | ORAL | Status: DC | PRN

## 2021-12-29 MED ORDER — ACETAMINOPHEN 325 MG PO TABS: 650.0000 mg | ORAL_TABLET | Freq: Four times a day (QID) | ORAL | Status: DC | PRN

## 2021-12-31 LAB — URINE CULTURE: Culture: 1000 — AB

## 2022-01-02 LAB — CULTURE, BLOOD (ROUTINE X 2)
Culture: NO GROWTH
Culture: NO GROWTH

## 2022-01-07 ENCOUNTER — Ambulatory Visit (HOSPITAL_BASED_OUTPATIENT_CLINIC_OR_DEPARTMENT_OTHER): Payer: Medicare Other | Admitting: Internal Medicine

## 2022-01-19 NOTE — Death Summary Note (Signed)
DEATH SUMMARY   Patient Details  Name: [name] MRN: [id number] DOB: 1928-03-27  Admission/Discharge Information   Admit Date:  2022/01/08  Date of Death: Date of Death: 2022-01-09  Time of Death: Time of Death: 0253  Length of Stay: 1  Referring Physician: [name], MD   Reason(s) for Hospitalization  Acute respiratory failure with hypoxia  Diagnoses  Preliminary cause of death:  Secondary Diagnoses (including complications and co-morbidities):  Principal Problem:   Severe sepsis ([place]) Active Problems:   COPD with acute exacerbation ([place])   Acute respiratory failure with hypoxia ([facility])   Elevated troponin Septic shock Lactic acidosis Acute HFpEF AKI on CKD 3a  Brief Hospital Course (including significant findings, care, treatment, and services provided and events leading to death)  [name] is a 88 y.o. year old male with a history of tobacco abuse before quitting about 10 years ago and COPD on nocturnal oxygen who presented with sudden onset shortness of breath today.  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  He was started on oxygen during his procedure.  By the time he got home he could barely walk from his car into his garage.  At that time his daughter and wife decided to call EMS.  He has had decreased functional ability over the last few months, especially over the last few weeks, which his wife attributes to sciatica on top of his baseline COPD.  He takes albuterol and Stiolto at home.  He denies fever, cough, sweats.  He feels hot, which his wife says is very unusual as he is usually cold.  He was admitted to the ICU but had progressive hemodynamic collapse requiring vasopressors to be started.  ***    Pertinent Labs and Studies  Significant Diagnostic Studies CT Angio Chest Pulmonary Embolism (PE) W or WO Contrast  Result Date: January 08, 2022 CLINICAL DATA:  Shortness of breath EXAM: CT  ANGIOGRAPHY CHEST WITH CONTRAST TECHNIQUE: Multidetector CT imaging of the chest was performed using the standard protocol during bolus administration of intravenous contrast. Multiplanar CT image reconstructions and MIPs were obtained to evaluate the vascular anatomy. RADIATION DOSE REDUCTION: This exam was performed according to the departmental dose-optimization program which includes automated exposure control, adjustment of the mA and/or kV according to patient size and/or use of iterative reconstruction technique. CONTRAST:  92m OMNIPAQUE IOHEXOL 350 MG/ML SOLN COMPARISON:  Chest radiograph dated 007/21/2023 CTA chest dated 07/14/2015. FINDINGS: Cardiovascular: Satisfactory opacification the bilateral pulmonary arteries to the lobar level. No evidence of pulmonary embolism. Although not tailored for evaluation of the thoracic aorta, there is no evidence of thoracic aortic aneurysm or dissection. Atherosclerotic calcifications of the aortic arch. The heart is normal in size.  No pericardial effusion. Mediastinum/Nodes: No suspicious mediastinal lymphadenopathy. Visualized thyroid is unremarkable. Lungs/Pleura: Evaluation of the lung parenchyma is constrained by respiratory motion. Within that constraint, there are no suspicious pulmonary nodules. Mild biapical pleural-parenchymal scarring. Moderate centrilobular emphysematous changes, upper lung predominant. Small bilateral pleural effusions. Associated interlobular septal thickening, lower lobe predominant, favoring mild interstitial edema. No pneumothorax. Upper Abdomen: Visualized upper abdomen is grossly unremarkable, noting vascular calcifications. Musculoskeletal: Visualized osseous structures are within normal limits. Review of the MIP images confirms the above findings. IMPRESSION: No evidence of pulmonary embolism. Suspected mild interstitial edema with small bilateral pleural effusions. Aortic Atherosclerosis (ICD10-I70.0) and Emphysema  (ICD10-J43.9). Electronically Signed   By: SJulian HyM.D.   On: 021-Jul-2023  21:27   DG Chest Portable 1 View  Result Date: 12/28/2021 CLINICAL DATA:  Respiratory distress EXAM: PORTABLE CHEST 1 VIEW COMPARISON:  11/27/2020 FINDINGS: Normal heart size. Aortic atherosclerosis. Mildly hyperinflated lungs. Increased left basilar interstitial markings with small left pleural effusion. No pneumothorax. IMPRESSION: Increased left basilar interstitial markings with small left pleural effusion, concerning for developing infiltrate. Electronically Signed   By: Davina Poke D.O.   On: 12/23/2021 17:04    Microbiology Recent Results (from the past 240 hour(s))  Resp Panel by RT-PCR (Flu A&B, Covid) Anterior Nasal Swab     Status: None   Collection Time: 01/02/2022  4:29 PM   Specimen: Anterior Nasal Swab  Result Value Ref Range Status   SARS Coronavirus 2 by RT PCR NEGATIVE NEGATIVE Final    Comment: (NOTE) SARS-CoV-2 target nucleic acids are NOT DETECTED.  The SARS-CoV-2 RNA is generally detectable in upper respiratory specimens during the acute phase of infection. The lowest concentration of SARS-CoV-2 viral copies this assay can detect is 138 copies/mL. A negative result does not preclude SARS-Cov-2 infection and should not be used as the sole basis for treatment or other patient management decisions. A negative result may occur with  improper specimen collection/handling, submission of specimen other than nasopharyngeal swab, presence of viral mutation(s) within the areas targeted by this assay, and inadequate number of viral copies(<138 copies/mL). A negative result must be combined with clinical observations, patient history, and epidemiological information. The expected result is Negative.  Fact Sheet for Patients:  EntrepreneurPulse.com.au  Fact Sheet for Healthcare Providers:  IncredibleEmployment.be  This test is no t yet approved or  cleared by the [place] FDA and  has been authorized for detection and/or diagnosis of SARS-CoV-2 by FDA under an Emergency Use Authorization (EUA). This EUA will remain  in effect (meaning this test can be used) for the duration of the COVID-19 declaration under Section 564(b)(1) of the Act, 21 U.S.C.section 360bbb-3(b)(1), unless the authorization is terminated  or revoked sooner.       Influenza A by PCR NEGATIVE NEGATIVE Final   Influenza B by PCR NEGATIVE NEGATIVE Final    Comment: (NOTE) The Xpert Xpress SARS-CoV-2/FLU/RSV plus assay is intended as an aid in the diagnosis of influenza from Nasopharyngeal swab specimens and should not be used as a sole basis for treatment. Nasal washings and aspirates are unacceptable for Xpert Xpress SARS-CoV-2/FLU/RSV testing.  Fact Sheet for Patients: EntrepreneurPulse.com.au  Fact Sheet for Healthcare Providers: IncredibleEmployment.be  This test is not yet approved or cleared by the [place] FDA and has been authorized for detection and/or diagnosis of SARS-CoV-2 by FDA under an Emergency Use Authorization (EUA). This EUA will remain in effect (meaning this test can be used) for the duration of the COVID-19 declaration under Section 564(b)(1) of the Act, 21 U.S.C. section 360bbb-3(b)(1), unless the authorization is terminated or revoked.  Performed at [facility], [place] [address] [place], [place] [address]   Culture, blood (routine x 2)     Status: None (Preliminary result)   Collection Time: 01/08/2022  5:10 PM   Specimen: BLOOD  Result Value Ref Range Status   Specimen Description   Final    BLOOD RIGHT ANTECUBITAL Performed at [facility] [address], [place], [place] [address]    Special Requests   Final    BOTTLES DRAWN AEROBIC AND ANAEROBIC Blood Culture results may not be optimal due to an excessive volume of blood received in culture  bottles Performed at [facility] [address], [place], [place] [address]    Culture   Final    NO GROWTH < 12 HOURS Performed at [facility] [address], [place], [place] [address]    Report Status PENDING  Incomplete  Culture, blood (routine x 2)     Status: None (Preliminary result)   Collection Time: 12/21/2021  5:15 PM   Specimen: BLOOD  Result Value Ref Range Status   Specimen Description   Final    BLOOD BLOOD LEFT FOREARM Performed at [facility] [address], [place], [place] [address]    Special Requests   Final    BOTTLES DRAWN AEROBIC ONLY Blood Culture results may not be optimal due to an inadequate volume of blood received in culture bottles Performed at [facility] [address], [place], [place] [address]    Culture   Final    NO GROWTH < 12 HOURS Performed at [facility] [address] [place], [place] [address]    Report Status PENDING  Incomplete  Urine Culture     Status: None (Preliminary result)   Collection Time: 01/05/2022  6:39 PM   Specimen: Urine, Catheterized  Result Value Ref Range Status   Specimen Description   Final    URINE, CATHETERIZED Performed at [facility] [address], [place], [place] [address]    Special Requests   Final    NONE Performed at [facility], [place] [address] [place], [place] [address]    Culture   Final    CULTURE REINCUBATED FOR BETTER GROWTH Performed at [facility], [place] [address] [place], [place] [address]    Report Status PENDING  Incomplete  MRSA Next Gen by PCR, Nasal     Status: None   Collection Time: 01/01/2022  9:07 PM   Specimen: Nasal Mucosa; Nasal Swab  Result Value Ref Range Status   MRSA by PCR Next Gen NOT DETECTED NOT DETECTED Final    Comment: (NOTE) The GeneXpert MRSA Assay (FDA approved for NASAL specimens only), is one component of a comprehensive MRSA  colonization surveillance program. It is not intended to diagnose MRSA infection nor to guide or monitor treatment for MRSA infections. Test performance is not FDA approved in patients less than 47 years old. Performed at [facility], [place] [address], [place],  [address]     Lab Basic Metabolic Panel: Recent Labs  Lab 12/25/2021 1629 12/24/2021 1900 01/09/2022 2039  NA 140  --  138  K 4.3  --  5.0  CL 105  --  104  CO2 26  --  21*  GLUCOSE 155*  --  188*  BUN 32*  --  34*  CREATININE 1.75*  --  2.05*  CALCIUM 9.2  --  8.1*  MG  --  1.7  --   PHOS  --  4.4  --    Liver Function Tests: Recent Labs  Lab 01/13/2022 1629 12/20/2021 1829  AST 26 21  ALT 22 18  ALKPHOS 192* 152*  BILITOT 0.8 0.9  PROT 6.5 5.4*  ALBUMIN 3.3* 2.5*   No results for input(s): "LIPASE", "AMYLASE" in the last 168 hours. No results for input(s): "AMMONIA" in the last 168 hours. CBC: Recent Labs  Lab 12/25/2021 1629  WBC 19.8*  NEUTROABS 16.7*  HGB 15.7  HCT 49.1  MCV 94.4  PLT 378  Cardiac Enzymes: Recent Labs  Lab 12/20/2021 1900  CKTOTAL 73   Sepsis Labs: Recent Labs  Lab 01/07/2022 1629 01/06/2022 1707 12/25/2021 1829 2022/01/17 0129  PROCALCITON  --   --  0.14  --   WBC 19.8*  --   --   --   LATICACIDVEN  --  2.4* 2.6* 8.5*    Procedures/Operations  ***   Julian Hy 2022-01-17, 7:28 PM

## 2022-01-19 NOTE — Progress Notes (Signed)
RT X's 2 attempted Arterial line insertion without success.  Rn made aware.

## 2022-01-19 NOTE — Progress Notes (Signed)
RT unable to get abg, RN aware.

## 2022-01-19 NOTE — Assessment & Plan Note (Signed)
Likely demand induced in the setting of tachycardia increased respirations and pulmonary edema. PCCM started patient on heparin agree Probably would benefit from cardiology consult in a.m. An echogram and serial EKG

## 2022-01-19 NOTE — Progress Notes (Signed)
eLink Physician-Brief Progress Note Patient Name: [name] DOB: April 21, 1928 MRN: [id number]   Date of Service  01-08-2022  HPI/Events of Note  Patient's wife and daughter arrived and requested a transition to comfort measures.  eICU Interventions  Comfort measures ordered.        Kerry Kass Gunda Maqueda 2022-01-08, 1:57 AM

## 2022-01-19 NOTE — Progress Notes (Signed)
eLink Physician-Brief Progress Note Patient Name: [name] DOB: 01/24/1928 MRN: [id number]   Date of Service  January 12, 2022  HPI/Events of Note  RT unable to insert an arterial line, it is unclear what patient's BP actually is, family is en-route to the hospital.  eICU Interventions  Will ask anesthesia to insert an arterial line so we have a rational assessment of blood pressure to inform the conversation with his spouse and daughter on their arrival. Stat ABG once arterial line is in.        Kerry Kass Erabella Kuipers 2022/01/12, 12:49 AM

## 2022-01-19 NOTE — Assessment & Plan Note (Addendum)
Care as per PCCM team On empiric antibiotics for possible pneumonia although CT at this point did not confirm

## 2022-01-19 NOTE — Assessment & Plan Note (Signed)
Consult PCCM patient is on BiPAP and stable for tried hospitalist at this time appreciate PCCM involvement and transferred to ICU

## 2022-01-19 NOTE — Progress Notes (Signed)
Pt family updated and en route to hospital

## 2022-01-19 DEATH — deceased

## 2022-11-08 ENCOUNTER — Telehealth: Payer: Self-pay

## 2023-03-10 NOTE — Telephone Encounter (Signed)
Patient is deceased.
